# Patient Record
Sex: Female | Born: 1935 | Race: White | Hispanic: No | Marital: Married | Smoking: Never smoker
Health system: Southern US, Community
[De-identification: ages and names within clinical notes are randomized; demographics above are authoritative.]

## PROBLEM LIST (undated history)

## (undated) DIAGNOSIS — I4821 Permanent atrial fibrillation: Secondary | ICD-10-CM

## (undated) DIAGNOSIS — D500 Iron deficiency anemia secondary to blood loss (chronic): Secondary | ICD-10-CM

## (undated) DIAGNOSIS — J449 Chronic obstructive pulmonary disease, unspecified: Secondary | ICD-10-CM

## (undated) DIAGNOSIS — R0902 Hypoxemia: Secondary | ICD-10-CM

## (undated) DIAGNOSIS — I639 Cerebral infarction, unspecified: Secondary | ICD-10-CM

## (undated) DIAGNOSIS — J189 Pneumonia, unspecified organism: Secondary | ICD-10-CM

## (undated) DIAGNOSIS — I509 Heart failure, unspecified: Secondary | ICD-10-CM

## (undated) DIAGNOSIS — K5520 Angiodysplasia of colon without hemorrhage: Secondary | ICD-10-CM

## (undated) DIAGNOSIS — D801 Nonfamilial hypogammaglobulinemia: Secondary | ICD-10-CM

## (undated) DIAGNOSIS — Z9289 Personal history of other medical treatment: Secondary | ICD-10-CM

## (undated) DIAGNOSIS — I4891 Unspecified atrial fibrillation: Secondary | ICD-10-CM

## (undated) DIAGNOSIS — M1990 Unspecified osteoarthritis, unspecified site: Secondary | ICD-10-CM

## (undated) DIAGNOSIS — K219 Gastro-esophageal reflux disease without esophagitis: Secondary | ICD-10-CM

## (undated) DIAGNOSIS — F0390 Unspecified dementia without behavioral disturbance: Secondary | ICD-10-CM

## (undated) DIAGNOSIS — I10 Essential (primary) hypertension: Secondary | ICD-10-CM

## (undated) HISTORY — PX: CHOLECYSTECTOMY: SHX55

## (undated) HISTORY — DX: Angiodysplasia of colon without hemorrhage: K55.20

## (undated) HISTORY — PX: KNEE SURGERY: SHX244

## (undated) HISTORY — DX: Personal history of other medical treatment: Z92.89

## (undated) HISTORY — DX: Iron deficiency anemia secondary to blood loss (chronic): D50.0

## (undated) HISTORY — PX: HIP FRACTURE SURGERY: SHX118

## (undated) HISTORY — PX: CATARACT EXTRACTION: SUR2

---

## 2007-07-05 DIAGNOSIS — I639 Cerebral infarction, unspecified: Secondary | ICD-10-CM

## 2007-07-05 DIAGNOSIS — Z9289 Personal history of other medical treatment: Secondary | ICD-10-CM

## 2007-07-05 HISTORY — DX: Personal history of other medical treatment: Z92.89

## 2007-07-05 HISTORY — DX: Cerebral infarction, unspecified: I63.9

## 2007-12-20 ENCOUNTER — Inpatient Hospital Stay (HOSPITAL_COMMUNITY): Admission: EM | Admit: 2007-12-20 | Discharge: 2007-12-24 | Payer: Self-pay | Admitting: Emergency Medicine

## 2007-12-20 ENCOUNTER — Encounter (INDEPENDENT_AMBULATORY_CARE_PROVIDER_SITE_OTHER): Payer: Self-pay | Admitting: Neurology

## 2007-12-31 ENCOUNTER — Ambulatory Visit: Payer: Self-pay | Admitting: Oncology

## 2008-01-03 LAB — CBC WITH DIFFERENTIAL/PLATELET
Basophils Absolute: 0 10*3/uL (ref 0.0–0.1)
Eosinophils Absolute: 0.3 10*3/uL (ref 0.0–0.5)
HGB: 8.1 g/dL — ABNORMAL LOW (ref 11.6–15.9)
MCV: 75.9 fL — ABNORMAL LOW (ref 81.0–101.0)
NEUT#: 6.3 10*3/uL (ref 1.5–6.5)
RDW: 22.7 % — ABNORMAL HIGH (ref 11.3–14.5)
lymph#: 1.2 10*3/uL (ref 0.9–3.3)

## 2008-01-07 ENCOUNTER — Encounter (HOSPITAL_COMMUNITY): Admission: RE | Admit: 2008-01-07 | Discharge: 2008-02-24 | Payer: Self-pay | Admitting: Oncology

## 2008-01-07 LAB — CBC WITH DIFFERENTIAL/PLATELET
BASO%: 0.5 % (ref 0.0–2.0)
EOS%: 7.9 % — ABNORMAL HIGH (ref 0.0–7.0)
LYMPH%: 14.7 % (ref 14.0–48.0)
MCH: 23.6 pg — ABNORMAL LOW (ref 26.0–34.0)
MCHC: 30.6 g/dL — ABNORMAL LOW (ref 32.0–36.0)
MONO#: 0.6 10*3/uL (ref 0.1–0.9)
RBC: 3.49 10*6/uL — ABNORMAL LOW (ref 3.70–5.32)
WBC: 7 10*3/uL (ref 3.9–10.0)
lymph#: 1 10*3/uL (ref 0.9–3.3)

## 2008-01-07 LAB — FERRITIN: Ferritin: 43 ng/mL (ref 10–291)

## 2008-01-16 LAB — CBC WITH DIFFERENTIAL/PLATELET
BASO%: 1.3 % (ref 0.0–2.0)
Eosinophils Absolute: 0.7 10*3/uL — ABNORMAL HIGH (ref 0.0–0.5)
LYMPH%: 19.6 % (ref 14.0–48.0)
MCHC: 30 g/dL — ABNORMAL LOW (ref 32.0–36.0)
MCV: 83.9 fL (ref 81.0–101.0)
MONO%: 10 % (ref 0.0–13.0)
NEUT#: 3.9 10*3/uL (ref 1.5–6.5)
Platelets: 401 10*3/uL — ABNORMAL HIGH (ref 145–400)
RBC: 3.35 10*6/uL — ABNORMAL LOW (ref 3.70–5.32)
RDW: 21.9 % — ABNORMAL HIGH (ref 11.3–14.5)
WBC: 6.6 10*3/uL (ref 3.9–10.0)

## 2008-01-21 LAB — CBC WITH DIFFERENTIAL/PLATELET
BASO%: 2.3 % — ABNORMAL HIGH (ref 0.0–2.0)
Eosinophils Absolute: 0.3 10*3/uL (ref 0.0–0.5)
LYMPH%: 14 % (ref 14.0–48.0)
MCHC: 31.4 g/dL — ABNORMAL LOW (ref 32.0–36.0)
MONO#: 1 10*3/uL — ABNORMAL HIGH (ref 0.1–0.9)
NEUT#: 4.5 10*3/uL (ref 1.5–6.5)
Platelets: 258 10*3/uL (ref 145–400)
RBC: 3.91 10*6/uL (ref 3.70–5.32)
RDW: 21.7 % — ABNORMAL HIGH (ref 11.3–14.5)
WBC: 7 10*3/uL (ref 3.9–10.0)
lymph#: 1 10*3/uL (ref 0.9–3.3)

## 2008-01-21 LAB — PROTIME-INR
INR: 1 — ABNORMAL LOW (ref 2.00–3.50)
Protime: 12 Seconds (ref 10.6–13.4)

## 2008-01-28 LAB — PROTIME-INR: Protime: 12 Seconds (ref 10.6–13.4)

## 2008-01-28 LAB — CBC WITH DIFFERENTIAL/PLATELET
Basophils Absolute: 0.1 10*3/uL (ref 0.0–0.1)
Eosinophils Absolute: 0.5 10*3/uL (ref 0.0–0.5)
HCT: 35.5 % (ref 34.8–46.6)
HGB: 10.9 g/dL — ABNORMAL LOW (ref 11.6–15.9)
LYMPH%: 19.3 % (ref 14.0–48.0)
MCV: 90.3 fL (ref 81.0–101.0)
MONO#: 0.4 10*3/uL (ref 0.1–0.9)
MONO%: 7.2 % (ref 0.0–13.0)
NEUT#: 3.1 10*3/uL (ref 1.5–6.5)
Platelets: 253 10*3/uL (ref 145–400)
WBC: 5 10*3/uL (ref 3.9–10.0)

## 2008-02-25 ENCOUNTER — Ambulatory Visit: Payer: Self-pay | Admitting: Oncology

## 2008-02-26 ENCOUNTER — Ambulatory Visit: Payer: Self-pay | Admitting: Hematology & Oncology

## 2008-02-27 LAB — CBC WITH DIFFERENTIAL (CANCER CENTER ONLY)
BASO#: 0 10e3/uL (ref 0.0–0.2)
BASO%: 0.4 % (ref 0.0–2.0)
EOS%: 10.1 % — ABNORMAL HIGH (ref 0.0–7.0)
Eosinophils Absolute: 0.5 10e3/uL (ref 0.0–0.5)
HCT: 31.8 % — ABNORMAL LOW (ref 34.8–46.6)
HGB: 10.8 g/dL — ABNORMAL LOW (ref 11.6–15.9)
LYMPH#: 1.1 10e3/uL (ref 0.9–3.3)
LYMPH%: 21.5 % (ref 14.0–48.0)
MCH: 29 pg (ref 26.0–34.0)
MCHC: 33.9 g/dL (ref 32.0–36.0)
MCV: 86 fL (ref 81–101)
MONO#: 0.4 10e3/uL (ref 0.1–0.9)
MONO%: 8.3 % (ref 0.0–13.0)
NEUT#: 3.1 10e3/uL (ref 1.5–6.5)
NEUT%: 59.7 % (ref 39.6–80.0)
Platelets: 311 10e3/uL (ref 145–400)
RBC: 3.72 10e6/uL (ref 3.70–5.32)
RDW: 14.4 % (ref 10.5–14.6)
WBC: 5.2 10e3/uL (ref 3.9–10.0)

## 2008-02-27 LAB — PROTIME-INR (CHCC SATELLITE)
INR: 1.7 — ABNORMAL LOW (ref 2.0–3.5)
Protime: 20.4 Seconds — ABNORMAL HIGH (ref 10.6–13.4)

## 2008-03-11 LAB — CBC WITH DIFFERENTIAL (CANCER CENTER ONLY)
BASO#: 0 10*3/uL (ref 0.0–0.2)
Eosinophils Absolute: 0.5 10*3/uL (ref 0.0–0.5)
HGB: 11 g/dL — ABNORMAL LOW (ref 11.6–15.9)
LYMPH%: 23.7 % (ref 14.0–48.0)
MCH: 28.4 pg (ref 26.0–34.0)
MCV: 87 fL (ref 81–101)
MONO#: 0.5 10*3/uL (ref 0.1–0.9)
MONO%: 9.2 % (ref 0.0–13.0)
NEUT#: 3 10*3/uL (ref 1.5–6.5)
Platelets: 298 10*3/uL (ref 145–400)
RBC: 3.87 10*6/uL (ref 3.70–5.32)
WBC: 5.2 10*3/uL (ref 3.9–10.0)

## 2008-03-11 LAB — PROTIME-INR (CHCC SATELLITE)

## 2008-04-01 ENCOUNTER — Ambulatory Visit (HOSPITAL_COMMUNITY): Admission: RE | Admit: 2008-04-01 | Discharge: 2008-04-01 | Payer: Self-pay | Admitting: Oncology

## 2008-04-01 LAB — CBC WITH DIFFERENTIAL/PLATELET
BASO%: 0.6 % (ref 0.0–2.0)
Eosinophils Absolute: 0.6 10*3/uL — ABNORMAL HIGH (ref 0.0–0.5)
HCT: 34.6 % — ABNORMAL LOW (ref 34.8–46.6)
HGB: 11.6 g/dL (ref 11.6–15.9)
LYMPH%: 16.8 % (ref 14.0–48.0)
MCHC: 33.4 g/dL (ref 32.0–36.0)
MONO#: 0.7 10*3/uL (ref 0.1–0.9)
NEUT#: 3.9 10*3/uL (ref 1.5–6.5)
NEUT%: 61.9 % (ref 39.6–76.8)
Platelets: 256 10*3/uL (ref 145–400)
WBC: 6.2 10*3/uL (ref 3.9–10.0)
lymph#: 1 10*3/uL (ref 0.9–3.3)

## 2008-04-01 LAB — PROTIME-INR: INR: 2.2 (ref 2.00–3.50)

## 2008-04-01 LAB — IRON AND TIBC
%SAT: 11 % — ABNORMAL LOW (ref 20–55)
TIBC: 329 ug/dL (ref 250–470)

## 2008-04-01 LAB — FERRITIN: Ferritin: 47 ng/mL (ref 10–291)

## 2008-04-01 LAB — VITAMIN B12: Vitamin B-12: 508 pg/mL (ref 211–911)

## 2008-04-25 ENCOUNTER — Ambulatory Visit: Payer: Self-pay | Admitting: Oncology

## 2008-05-23 LAB — PROTIME-INR

## 2008-05-23 LAB — CBC WITH DIFFERENTIAL/PLATELET
BASO%: 1.9 % (ref 0.0–2.0)
Basophils Absolute: 0.1 10*3/uL (ref 0.0–0.1)
EOS%: 11.2 % — ABNORMAL HIGH (ref 0.0–7.0)
HCT: 33.5 % — ABNORMAL LOW (ref 34.8–46.6)
HGB: 10.5 g/dL — ABNORMAL LOW (ref 11.6–15.9)
LYMPH%: 16.6 % (ref 14.0–48.0)
MCH: 28.7 pg (ref 26.0–34.0)
MCHC: 31.5 g/dL — ABNORMAL LOW (ref 32.0–36.0)
MCV: 91.2 fL (ref 81.0–101.0)
MONO%: 8.8 % (ref 0.0–13.0)
NEUT%: 61.5 % (ref 39.6–76.8)

## 2008-06-06 LAB — PROTHROMBIN TIME: Prothrombin Time: 20.1 seconds — ABNORMAL HIGH (ref 11.6–15.2)

## 2008-06-20 ENCOUNTER — Ambulatory Visit: Payer: Self-pay | Admitting: Hematology & Oncology

## 2008-07-01 ENCOUNTER — Ambulatory Visit: Payer: Self-pay | Admitting: Oncology

## 2008-07-01 LAB — CBC & DIFF AND RETIC
BASO%: 0.7 % (ref 0.0–2.0)
Basophils Absolute: 0 10*3/uL (ref 0.0–0.1)
EOS%: 10.3 % — ABNORMAL HIGH (ref 0.0–7.0)
HCT: 31.5 % — ABNORMAL LOW (ref 34.8–46.6)
HGB: 10.4 g/dL — ABNORMAL LOW (ref 11.6–15.9)
IRF: 0.46 — ABNORMAL HIGH (ref 0.130–0.330)
LYMPH%: 15.1 % (ref 14.0–48.0)
MCH: 29.8 pg (ref 26.0–34.0)
MCHC: 32.8 g/dL (ref 32.0–36.0)
MCV: 90.9 fL (ref 81.0–101.0)
NEUT%: 65.2 % (ref 39.6–76.8)
Platelets: 305 10*3/uL (ref 145–400)

## 2008-07-01 LAB — FERRITIN: Ferritin: 48 ng/mL (ref 10–291)

## 2008-07-01 LAB — IRON AND TIBC
%SAT: 12 % — ABNORMAL LOW (ref 20–55)
Iron: 43 ug/dL (ref 42–145)

## 2008-07-01 LAB — PROTIME-INR: Protime: 28.8 Seconds — ABNORMAL HIGH (ref 10.6–13.4)

## 2008-09-26 ENCOUNTER — Ambulatory Visit: Payer: Self-pay | Admitting: Oncology

## 2009-01-28 ENCOUNTER — Inpatient Hospital Stay (HOSPITAL_COMMUNITY): Admission: EM | Admit: 2009-01-28 | Discharge: 2009-02-05 | Payer: Self-pay | Admitting: Emergency Medicine

## 2009-02-04 ENCOUNTER — Ambulatory Visit: Payer: Self-pay | Admitting: Physical Medicine & Rehabilitation

## 2009-04-24 ENCOUNTER — Ambulatory Visit: Payer: Self-pay | Admitting: Oncology

## 2009-04-28 LAB — CBC WITH DIFFERENTIAL/PLATELET
Basophils Absolute: 0 10*3/uL (ref 0.0–0.1)
Eosinophils Absolute: 0.8 10*3/uL — ABNORMAL HIGH (ref 0.0–0.5)
HGB: 10.8 g/dL — ABNORMAL LOW (ref 11.6–15.9)
MCV: 90.4 fL (ref 79.5–101.0)
MONO#: 0.4 10*3/uL (ref 0.1–0.9)
MONO%: 7.9 % (ref 0.0–14.0)
NEUT#: 3.4 10*3/uL (ref 1.5–6.5)
RBC: 3.55 10*6/uL — ABNORMAL LOW (ref 3.70–5.45)
RDW: 16.5 % — ABNORMAL HIGH (ref 11.2–14.5)
WBC: 5.7 10*3/uL (ref 3.9–10.3)
lymph#: 1 10*3/uL (ref 0.9–3.3)

## 2009-04-28 LAB — PROTHROMBIN TIME
INR: 1.62 — ABNORMAL HIGH (ref <1.50)
Prothrombin Time: 19.1 seconds — ABNORMAL HIGH (ref 11.6–15.2)

## 2009-05-04 ENCOUNTER — Ambulatory Visit: Payer: Self-pay | Admitting: Hematology & Oncology

## 2009-05-18 LAB — PROTIME-INR (CHCC SATELLITE)
INR: 1.9 — ABNORMAL LOW (ref 2.0–3.5)
Protime: 22.8 Seconds — ABNORMAL HIGH (ref 10.6–13.4)

## 2009-06-19 ENCOUNTER — Ambulatory Visit: Payer: Self-pay | Admitting: Oncology

## 2009-06-23 LAB — CBC WITH DIFFERENTIAL/PLATELET
BASO%: 1.6 % (ref 0.0–2.0)
EOS%: 14.1 % — ABNORMAL HIGH (ref 0.0–7.0)
HCT: 34.2 % — ABNORMAL LOW (ref 34.8–46.6)
LYMPH%: 21.5 % (ref 14.0–49.7)
MCH: 29 pg (ref 25.1–34.0)
MCHC: 31.6 g/dL (ref 31.5–36.0)
MONO#: 0.6 10*3/uL (ref 0.1–0.9)
MONO%: 11.7 % (ref 0.0–14.0)
NEUT%: 51.1 % (ref 38.4–76.8)
Platelets: 287 10*3/uL (ref 145–400)
RBC: 3.73 10*6/uL (ref 3.70–5.45)
WBC: 5.1 10*3/uL (ref 3.9–10.3)

## 2009-06-23 LAB — PROTIME-INR: Protime: 16.8 Seconds — ABNORMAL HIGH (ref 10.6–13.4)

## 2009-07-08 ENCOUNTER — Ambulatory Visit: Payer: Self-pay | Admitting: Hematology & Oncology

## 2009-07-09 LAB — PROTIME-INR (CHCC SATELLITE): Protime: 21.6 Seconds — ABNORMAL HIGH (ref 10.6–13.4)

## 2009-07-09 LAB — IRON AND TIBC: UIBC: 28 ug/dL

## 2009-07-09 LAB — FERRITIN: Ferritin: 67 ng/mL (ref 10–291)

## 2009-08-07 ENCOUNTER — Ambulatory Visit: Payer: Self-pay | Admitting: Hematology & Oncology

## 2009-08-10 LAB — PROTIME-INR (CHCC SATELLITE): INR: 1.9 — ABNORMAL LOW (ref 2.0–3.5)

## 2009-08-31 ENCOUNTER — Inpatient Hospital Stay (HOSPITAL_COMMUNITY): Admission: EM | Admit: 2009-08-31 | Discharge: 2009-09-04 | Payer: Self-pay | Admitting: Emergency Medicine

## 2009-09-11 ENCOUNTER — Ambulatory Visit: Payer: Self-pay | Admitting: Hematology & Oncology

## 2009-09-14 LAB — PROTIME-INR (CHCC SATELLITE): INR: 2.5 (ref 2.0–3.5)

## 2009-09-14 LAB — RETICULOCYTES (CHCC)
RBC.: 3.83 MIL/uL — ABNORMAL LOW (ref 3.87–5.11)
Retic Ct Pct: 3.3 % — ABNORMAL HIGH (ref 0.4–3.1)

## 2009-09-14 LAB — CBC WITH DIFFERENTIAL (CANCER CENTER ONLY)
Eosinophils Absolute: 0.8 10*3/uL — ABNORMAL HIGH (ref 0.0–0.5)
MONO#: 0.5 10*3/uL (ref 0.1–0.9)
NEUT#: 3.6 10*3/uL (ref 1.5–6.5)
Platelets: 513 10*3/uL — ABNORMAL HIGH (ref 145–400)
RBC: 3.83 10*6/uL (ref 3.70–5.32)
WBC: 6.3 10*3/uL (ref 3.9–10.0)

## 2009-09-14 LAB — IRON AND TIBC: TIBC: 366 ug/dL (ref 250–470)

## 2009-09-14 LAB — BASIC METABOLIC PANEL
BUN: 14 mg/dL (ref 6–23)
Calcium: 10.1 mg/dL (ref 8.4–10.5)
Creatinine, Ser: 0.92 mg/dL (ref 0.40–1.20)

## 2009-09-21 LAB — PROTIME-INR (CHCC SATELLITE)
INR: 1.8 — ABNORMAL LOW (ref 2.0–3.5)
Protime: 21.6 Seconds — ABNORMAL HIGH (ref 10.6–13.4)

## 2009-09-24 LAB — URINALYSIS, MICROSCOPIC (CHCC SATELLITE)
Ketones: NEGATIVE mg/dL
Leukocyte Esterase: NEGATIVE
Protein: NEGATIVE mg/dL
pH: 6 (ref 4.60–8.00)

## 2009-09-25 LAB — URINE CULTURE

## 2009-10-12 ENCOUNTER — Ambulatory Visit: Payer: Self-pay | Admitting: Hematology & Oncology

## 2009-10-13 LAB — CBC WITH DIFFERENTIAL (CANCER CENTER ONLY)
BASO#: 0.1 10*3/uL (ref 0.0–0.2)
BASO%: 0.6 % (ref 0.0–2.0)
EOS%: 3 % (ref 0.0–7.0)
HGB: 11.3 g/dL — ABNORMAL LOW (ref 11.6–15.9)
LYMPH#: 1.6 10*3/uL (ref 0.9–3.3)
MCHC: 31.7 g/dL — ABNORMAL LOW (ref 32.0–36.0)
MONO#: 0.9 10*3/uL (ref 0.1–0.9)
NEUT#: 8 10*3/uL — ABNORMAL HIGH (ref 1.5–6.5)
WBC: 10.9 10*3/uL — ABNORMAL HIGH (ref 3.9–10.0)

## 2009-10-13 LAB — PROTIME-INR (CHCC SATELLITE): INR: 1.9 — ABNORMAL LOW (ref 2.0–3.5)

## 2009-11-10 LAB — PROTIME-INR (CHCC SATELLITE): INR: 1.9 — ABNORMAL LOW (ref 2.0–3.5)

## 2009-12-18 ENCOUNTER — Ambulatory Visit: Payer: Self-pay | Admitting: Oncology

## 2010-01-01 ENCOUNTER — Ambulatory Visit: Payer: Self-pay | Admitting: Hematology & Oncology

## 2010-01-05 LAB — PROTIME-INR (CHCC SATELLITE)
INR: 2.4 (ref 2.0–3.5)
Protime: 28.8 Seconds — ABNORMAL HIGH (ref 10.6–13.4)

## 2010-01-20 LAB — PROTIME-INR (CHCC SATELLITE): Protime: 22.8 Seconds — ABNORMAL HIGH (ref 10.6–13.4)

## 2010-02-15 ENCOUNTER — Ambulatory Visit: Payer: Self-pay | Admitting: Hematology & Oncology

## 2010-02-17 LAB — PROTIME-INR (CHCC SATELLITE)

## 2010-03-11 LAB — CBC WITH DIFFERENTIAL (CANCER CENTER ONLY)
BASO#: 0.1 10*3/uL (ref 0.0–0.2)
LYMPH#: 1.1 10*3/uL (ref 0.9–3.3)
MCH: 29.3 pg (ref 26.0–34.0)
MCHC: 33.2 g/dL (ref 32.0–36.0)
MCV: 88 fL (ref 81–101)
MONO#: 1.2 10*3/uL — ABNORMAL HIGH (ref 0.1–0.9)
MONO%: 9.9 % (ref 0.0–13.0)
NEUT#: 9.7 10*3/uL — ABNORMAL HIGH (ref 1.5–6.5)

## 2010-03-11 LAB — PROTIME-INR (CHCC SATELLITE)

## 2010-03-17 ENCOUNTER — Ambulatory Visit: Payer: Self-pay | Admitting: Hematology & Oncology

## 2010-03-18 LAB — PROTIME-INR (CHCC SATELLITE)
INR: 2.2 (ref 2.0–3.5)
Protime: 26.4 Seconds — ABNORMAL HIGH (ref 10.6–13.4)

## 2010-03-31 LAB — PROTIME-INR (CHCC SATELLITE)

## 2010-05-05 ENCOUNTER — Ambulatory Visit: Payer: Self-pay | Admitting: Hematology & Oncology

## 2010-05-12 LAB — CBC WITH DIFFERENTIAL (CANCER CENTER ONLY)
Eosinophils Absolute: 0.7 10*3/uL — ABNORMAL HIGH (ref 0.0–0.5)
HCT: 36.9 % (ref 34.8–46.6)
HGB: 12 g/dL (ref 11.6–15.9)
LYMPH%: 11.1 % — ABNORMAL LOW (ref 14.0–48.0)
MCV: 90 fL (ref 81–101)
MONO#: 0.8 10*3/uL (ref 0.1–0.9)
NEUT%: 69.7 % (ref 39.6–80.0)
RBC: 4.08 10*6/uL (ref 3.70–5.32)
WBC: 8.1 10*3/uL (ref 3.9–10.0)

## 2010-06-02 LAB — PROTIME-INR (CHCC SATELLITE)

## 2010-06-02 LAB — PROTHROMBIN TIME
INR: 4.08 — ABNORMAL HIGH (ref <1.50)
Prothrombin Time: 39.5 seconds — ABNORMAL HIGH (ref 11.6–15.2)

## 2010-06-08 ENCOUNTER — Ambulatory Visit: Payer: Self-pay | Admitting: Hematology & Oncology

## 2010-06-10 LAB — PROTIME-INR (CHCC SATELLITE): Protime: 31.2 Seconds — ABNORMAL HIGH (ref 10.6–13.4)

## 2010-06-21 ENCOUNTER — Ambulatory Visit: Payer: Self-pay | Admitting: Oncology

## 2010-06-22 LAB — COMPREHENSIVE METABOLIC PANEL
Alkaline Phosphatase: 66 U/L (ref 39–117)
BUN: 16 mg/dL (ref 6–23)
CO2: 32 mEq/L (ref 19–32)
Creatinine, Ser: 0.81 mg/dL (ref 0.40–1.20)
Glucose, Bld: 133 mg/dL — ABNORMAL HIGH (ref 70–99)
Total Bilirubin: 0.2 mg/dL — ABNORMAL LOW (ref 0.3–1.2)

## 2010-06-22 LAB — CBC & DIFF AND RETIC
BASO%: 0.5 % (ref 0.0–2.0)
EOS%: 9 % — ABNORMAL HIGH (ref 0.0–7.0)
LYMPH%: 21.7 % (ref 14.0–49.7)
MCH: 28 pg (ref 25.1–34.0)
MCHC: 30.8 g/dL — ABNORMAL LOW (ref 31.5–36.0)
MCV: 90.7 fL (ref 79.5–101.0)
MONO#: 0.6 10*3/uL (ref 0.1–0.9)
MONO%: 11 % (ref 0.0–14.0)
NEUT%: 57.8 % (ref 38.4–76.8)
Platelets: 360 10*3/uL (ref 145–400)
RBC: 3.97 10*6/uL (ref 3.70–5.45)
Retic %: 3.13 % — ABNORMAL HIGH (ref 0.50–1.50)
WBC: 5.6 10*3/uL (ref 3.9–10.3)

## 2010-06-22 LAB — IRON AND TIBC
%SAT: 36 % (ref 20–55)
Iron: 121 ug/dL (ref 42–145)
TIBC: 332 ug/dL (ref 250–470)

## 2010-06-22 LAB — LACTATE DEHYDROGENASE: LDH: 154 U/L (ref 94–250)

## 2010-07-06 ENCOUNTER — Ambulatory Visit: Payer: Self-pay | Admitting: Hematology & Oncology

## 2010-07-06 LAB — PROTIME-INR (CHCC SATELLITE): Protime: 56.4 Seconds — ABNORMAL HIGH (ref 10.6–13.4)

## 2010-07-06 LAB — CBC WITH DIFFERENTIAL (CANCER CENTER ONLY)
BASO#: 0 10*3/uL (ref 0.0–0.2)
EOS%: 10.2 % — ABNORMAL HIGH (ref 0.0–7.0)
HCT: 35 % (ref 34.8–46.6)
MCV: 87 fL (ref 81–101)
MONO%: 9.6 % (ref 0.0–13.0)
NEUT%: 59.9 % (ref 39.6–80.0)
RBC: 4 10*6/uL (ref 3.70–5.32)

## 2010-08-03 LAB — PROTIME-INR (CHCC SATELLITE): Protime: 19.2 Seconds — ABNORMAL HIGH (ref 10.6–13.4)

## 2010-09-01 ENCOUNTER — Other Ambulatory Visit: Payer: Self-pay | Admitting: Oncology

## 2010-09-01 ENCOUNTER — Encounter (HOSPITAL_BASED_OUTPATIENT_CLINIC_OR_DEPARTMENT_OTHER): Payer: Medicare Other | Admitting: Hematology & Oncology

## 2010-09-01 DIAGNOSIS — K5521 Angiodysplasia of colon with hemorrhage: Secondary | ICD-10-CM

## 2010-09-01 DIAGNOSIS — D500 Iron deficiency anemia secondary to blood loss (chronic): Secondary | ICD-10-CM

## 2010-09-01 LAB — CBC WITH DIFFERENTIAL (CANCER CENTER ONLY)
BASO%: 0.4 % (ref 0.0–2.0)
EOS%: 9.5 % — ABNORMAL HIGH (ref 0.0–7.0)
HCT: 33.9 % — ABNORMAL LOW (ref 34.8–46.6)
LYMPH#: 0.9 10*3/uL (ref 0.9–3.3)
LYMPH%: 11.2 % — ABNORMAL LOW (ref 14.0–48.0)
MCHC: 32.8 g/dL (ref 32.0–36.0)
MONO%: 8.6 % (ref 0.0–13.0)
NEUT%: 70.3 % (ref 39.6–80.0)
RDW: 13.4 % (ref 10.5–14.6)

## 2010-09-01 LAB — PROTIME-INR (CHCC SATELLITE)

## 2010-09-14 ENCOUNTER — Other Ambulatory Visit: Payer: Self-pay | Admitting: Hematology & Oncology

## 2010-09-14 ENCOUNTER — Encounter (HOSPITAL_BASED_OUTPATIENT_CLINIC_OR_DEPARTMENT_OTHER): Payer: Medicare Other | Admitting: Hematology & Oncology

## 2010-09-14 DIAGNOSIS — D500 Iron deficiency anemia secondary to blood loss (chronic): Secondary | ICD-10-CM

## 2010-09-14 DIAGNOSIS — K5521 Angiodysplasia of colon with hemorrhage: Secondary | ICD-10-CM

## 2010-09-14 LAB — CBC WITH DIFFERENTIAL (CANCER CENTER ONLY)
BASO%: 0.9 % (ref 0.0–2.0)
EOS%: 11.4 % — ABNORMAL HIGH (ref 0.0–7.0)
LYMPH#: 0.7 10*3/uL — ABNORMAL LOW (ref 0.9–3.3)
MCH: 28.1 pg (ref 26.0–34.0)
MCHC: 31 g/dL — ABNORMAL LOW (ref 32.0–36.0)
MONO%: 10.7 % (ref 0.0–13.0)
NEUT#: 3.5 10*3/uL (ref 1.5–6.5)
Platelets: 326 10*3/uL (ref 145–400)
RDW: 15.6 % (ref 11.1–15.7)

## 2010-09-14 LAB — PROTIME-INR (CHCC SATELLITE)

## 2010-09-22 LAB — DIFFERENTIAL
Lymphocytes Relative: 7 % — ABNORMAL LOW (ref 12–46)
Lymphs Abs: 0.9 10*3/uL (ref 0.7–4.0)
Monocytes Absolute: 1.1 10*3/uL — ABNORMAL HIGH (ref 0.1–1.0)
Monocytes Relative: 8 % (ref 3–12)
Neutro Abs: 11.3 10*3/uL — ABNORMAL HIGH (ref 1.7–7.7)

## 2010-09-22 LAB — CBC
HCT: 30 % — ABNORMAL LOW (ref 36.0–46.0)
MCV: 93.2 fL (ref 78.0–100.0)
Platelets: 220 10*3/uL (ref 150–400)
RDW: 16.4 % — ABNORMAL HIGH (ref 11.5–15.5)

## 2010-09-22 LAB — COMPREHENSIVE METABOLIC PANEL
AST: 103 U/L — ABNORMAL HIGH (ref 0–37)
Albumin: 2.9 g/dL — ABNORMAL LOW (ref 3.5–5.2)
BUN: 15 mg/dL (ref 6–23)
Creatinine, Ser: 0.72 mg/dL (ref 0.4–1.2)
GFR calc Af Amer: >60 mL/min (ref >60)
Total Protein: 5.2 g/dL — ABNORMAL LOW (ref 6.0–8.3)

## 2010-09-22 LAB — URINALYSIS, ROUTINE W REFLEX MICROSCOPIC
Nitrite: NEGATIVE
Specific Gravity, Urine: 1.025 (ref 1.005–1.030)
Urobilinogen, UA: 0.2 mg/dL (ref 0.0–1.0)

## 2010-09-22 LAB — URINE CULTURE

## 2010-09-22 LAB — CULTURE, BLOOD (ROUTINE X 2): Culture: NO GROWTH

## 2010-09-22 LAB — POCT CARDIAC MARKERS
CKMB, poc: <1 ng/mL — ABNORMAL LOW (ref 1.0–8.0)
Myoglobin, poc: 50.5 ng/mL (ref 12–200)
Troponin i, poc: <0.05 ng/mL (ref 0.00–0.09)

## 2010-09-22 LAB — LACTIC ACID, PLASMA: Lactic Acid, Venous: 2.3 mmol/L — ABNORMAL HIGH (ref 0.5–2.2)

## 2010-09-26 LAB — CBC
HCT: 26.4 % — ABNORMAL LOW (ref 36.0–46.0)
HCT: 26.8 % — ABNORMAL LOW (ref 36.0–46.0)
Hemoglobin: 8.9 g/dL — ABNORMAL LOW (ref 12.0–15.0)
Hemoglobin: 9 g/dL — ABNORMAL LOW (ref 12.0–15.0)
MCHC: 33.2 g/dL (ref 30.0–36.0)
MCHC: 33.4 g/dL (ref 30.0–36.0)
MCHC: 33.4 g/dL (ref 30.0–36.0)
MCHC: 33.8 g/dL (ref 30.0–36.0)
MCV: 92.9 fL (ref 78.0–100.0)
Platelets: 234 10*3/uL (ref 150–400)
Platelets: 246 10*3/uL (ref 150–400)
RBC: 2.93 MIL/uL — ABNORMAL LOW (ref 3.87–5.11)
RBC: 3.06 MIL/uL — ABNORMAL LOW (ref 3.87–5.11)
RDW: 15.7 % — ABNORMAL HIGH (ref 11.5–15.5)
RDW: 15.7 % — ABNORMAL HIGH (ref 11.5–15.5)
RDW: 15.8 % — ABNORMAL HIGH (ref 11.5–15.5)

## 2010-09-26 LAB — BASIC METABOLIC PANEL
BUN: 5 mg/dL — ABNORMAL LOW (ref 6–23)
BUN: 6 mg/dL (ref 6–23)
CO2: 25 mEq/L (ref 19–32)
CO2: 28 mEq/L (ref 19–32)
CO2: 28 mEq/L (ref 19–32)
Calcium: 7.8 mg/dL — ABNORMAL LOW (ref 8.4–10.5)
Calcium: 8.4 mg/dL (ref 8.4–10.5)
Chloride: 109 mEq/L (ref 96–112)
Creatinine, Ser: 0.57 mg/dL (ref 0.4–1.2)
Creatinine, Ser: 0.63 mg/dL (ref 0.4–1.2)
Creatinine, Ser: 0.66 mg/dL (ref 0.4–1.2)
GFR calc Af Amer: >60 mL/min (ref >60)
GFR calc Af Amer: >60 mL/min (ref >60)
GFR calc non Af Amer: >60 mL/min (ref >60)
Glucose, Bld: 108 mg/dL — ABNORMAL HIGH (ref 70–99)
Glucose, Bld: 109 mg/dL — ABNORMAL HIGH (ref 70–99)
Glucose, Bld: 114 mg/dL — ABNORMAL HIGH (ref 70–99)
Potassium: 3.6 mEq/L (ref 3.5–5.1)
Sodium: 141 mEq/L (ref 135–145)

## 2010-09-26 LAB — PROTIME-INR
INR: 2.45 — ABNORMAL HIGH (ref 0.00–1.49)
Prothrombin Time: 26.4 seconds — ABNORMAL HIGH (ref 11.6–15.2)

## 2010-09-26 LAB — DIFFERENTIAL
Basophils Absolute: 0 10*3/uL (ref 0.0–0.1)
Basophils Relative: 0 % (ref 0–1)
Eosinophils Relative: 3 % (ref 0–5)
Monocytes Absolute: 0.9 10*3/uL (ref 0.1–1.0)

## 2010-10-07 ENCOUNTER — Other Ambulatory Visit: Payer: Self-pay | Admitting: Oncology

## 2010-10-07 ENCOUNTER — Encounter (HOSPITAL_BASED_OUTPATIENT_CLINIC_OR_DEPARTMENT_OTHER): Payer: Medicare Other | Admitting: Hematology & Oncology

## 2010-10-07 DIAGNOSIS — K5521 Angiodysplasia of colon with hemorrhage: Secondary | ICD-10-CM

## 2010-10-07 DIAGNOSIS — D500 Iron deficiency anemia secondary to blood loss (chronic): Secondary | ICD-10-CM

## 2010-10-07 LAB — CBC WITH DIFFERENTIAL (CANCER CENTER ONLY)
Eosinophils Absolute: 0.5 10*3/uL (ref 0.0–0.5)
LYMPH%: 14.8 % (ref 14.0–48.0)
MCH: 27.8 pg (ref 26.0–34.0)
MCHC: 31 g/dL — ABNORMAL LOW (ref 32.0–36.0)
MONO#: 1 10*3/uL — ABNORMAL HIGH (ref 0.1–0.9)
NEUT#: 4.9 10*3/uL (ref 1.5–6.5)
RDW: 14.5 % (ref 11.1–15.7)

## 2010-10-07 LAB — PROTIME-INR (CHCC SATELLITE): INR: 2.1 (ref 2.0–3.5)

## 2010-10-09 LAB — BASIC METABOLIC PANEL
BUN: 9 mg/dL (ref 6–23)
CO2: 32 mEq/L (ref 19–32)
Calcium: 8.9 mg/dL (ref 8.4–10.5)
Creatinine, Ser: 0.57 mg/dL (ref 0.4–1.2)
Glucose, Bld: 118 mg/dL — ABNORMAL HIGH (ref 70–99)
Sodium: 138 mEq/L (ref 135–145)

## 2010-10-09 LAB — CBC
HCT: 26.5 % — ABNORMAL LOW (ref 36.0–46.0)
HCT: 30 % — ABNORMAL LOW (ref 36.0–46.0)
HCT: 30.5 % — ABNORMAL LOW (ref 36.0–46.0)
Hemoglobin: 10.2 g/dL — ABNORMAL LOW (ref 12.0–15.0)
Hemoglobin: 9.1 g/dL — ABNORMAL LOW (ref 12.0–15.0)
Hemoglobin: 9.7 g/dL — ABNORMAL LOW (ref 12.0–15.0)
MCHC: 32.3 g/dL (ref 30.0–36.0)
MCHC: 33.2 g/dL (ref 30.0–36.0)
MCHC: 33.9 g/dL (ref 30.0–36.0)
MCV: 90.2 fL (ref 78.0–100.0)
Platelets: 206 10*3/uL (ref 150–400)
Platelets: 207 10*3/uL (ref 150–400)
Platelets: 288 10*3/uL (ref 150–400)
RBC: 3.38 MIL/uL — ABNORMAL LOW (ref 3.87–5.11)
RDW: 15.5 % (ref 11.5–15.5)
RDW: 15.6 % — ABNORMAL HIGH (ref 11.5–15.5)
RDW: 15.7 % — ABNORMAL HIGH (ref 11.5–15.5)
RDW: 15.8 % — ABNORMAL HIGH (ref 11.5–15.5)
WBC: 8.2 10*3/uL (ref 4.0–10.5)

## 2010-10-09 LAB — COMPREHENSIVE METABOLIC PANEL
ALT: 28 U/L (ref 0–35)
AST: 28 U/L (ref 0–37)
Albumin: 2 g/dL — ABNORMAL LOW (ref 3.5–5.2)
Alkaline Phosphatase: 69 U/L (ref 39–117)
CO2: 30 mEq/L (ref 19–32)
Chloride: 103 mEq/L (ref 96–112)
GFR calc Af Amer: >60 mL/min (ref >60)
Potassium: 3.3 mEq/L — ABNORMAL LOW (ref 3.5–5.1)
Total Bilirubin: 0.4 mg/dL (ref 0.3–1.2)

## 2010-10-09 LAB — PROTIME-INR
INR: 2.4 — ABNORMAL HIGH (ref 0.00–1.49)
INR: 2.6 — ABNORMAL HIGH (ref 0.00–1.49)
Prothrombin Time: 29.5 seconds — ABNORMAL HIGH (ref 11.6–15.2)

## 2010-10-09 LAB — CROSSMATCH

## 2010-10-09 LAB — VITAMIN B12: Vitamin B-12: 920 pg/mL — ABNORMAL HIGH (ref 211–911)

## 2010-10-09 LAB — AMMONIA: Ammonia: 15 umol/L (ref 11–35)

## 2010-10-09 LAB — APTT: aPTT: 38 seconds — ABNORMAL HIGH (ref 24–37)

## 2010-10-10 LAB — PREPARE FRESH FROZEN PLASMA

## 2010-10-10 LAB — URINALYSIS, MICROSCOPIC ONLY
Specific Gravity, Urine: 1.022 (ref 1.005–1.030)
Urobilinogen, UA: 1 mg/dL (ref 0.0–1.0)
pH: 5.5 (ref 5.0–8.0)

## 2010-10-10 LAB — PREPARE RBC (CROSSMATCH)

## 2010-10-10 LAB — BASIC METABOLIC PANEL
BUN: 9 mg/dL (ref 6–23)
CO2: 31 mEq/L (ref 19–32)
Calcium: 8.7 mg/dL (ref 8.4–10.5)
Calcium: 9.3 mg/dL (ref 8.4–10.5)
Chloride: 104 mEq/L (ref 96–112)
Creatinine, Ser: 0.71 mg/dL (ref 0.4–1.2)
GFR calc Af Amer: >60 mL/min (ref >60)
GFR calc non Af Amer: >60 mL/min (ref >60)
GFR calc non Af Amer: >60 mL/min (ref >60)
Glucose, Bld: 168 mg/dL — ABNORMAL HIGH (ref 70–99)
Potassium: 3.1 mEq/L — ABNORMAL LOW (ref 3.5–5.1)
Potassium: 3.4 mEq/L — ABNORMAL LOW (ref 3.5–5.1)
Sodium: 138 mEq/L (ref 135–145)
Sodium: 141 mEq/L (ref 135–145)
Sodium: 141 mEq/L (ref 135–145)

## 2010-10-10 LAB — LIPID PANEL
Cholesterol: 115 mg/dL (ref 0–200)
LDL Cholesterol: 40 mg/dL (ref 0–99)
Total CHOL/HDL Ratio: 2 RATIO
VLDL: 17 mg/dL (ref 0–40)

## 2010-10-10 LAB — HEPATIC FUNCTION PANEL
AST: 40 U/L — ABNORMAL HIGH (ref 0–37)
Albumin: 3 g/dL — ABNORMAL LOW (ref 3.5–5.2)
Total Protein: 5 g/dL — ABNORMAL LOW (ref 6.0–8.3)

## 2010-10-10 LAB — MAGNESIUM: Magnesium: 1.7 mg/dL (ref 1.5–2.5)

## 2010-10-10 LAB — RETICULOCYTES
RBC.: 3.48 MIL/uL — ABNORMAL LOW (ref 3.87–5.11)
Retic Count, Absolute: 114.8 10*3/uL (ref 19.0–186.0)

## 2010-10-10 LAB — CBC
HCT: 31.6 % — ABNORMAL LOW (ref 36.0–46.0)
HCT: 32.2 % — ABNORMAL LOW (ref 36.0–46.0)
Hemoglobin: 10.3 g/dL — ABNORMAL LOW (ref 12.0–15.0)
Hemoglobin: 10.4 g/dL — ABNORMAL LOW (ref 12.0–15.0)
Hemoglobin: 10.8 g/dL — ABNORMAL LOW (ref 12.0–15.0)
MCHC: 32 g/dL (ref 30.0–36.0)
MCV: 91 fL (ref 78.0–100.0)
Platelets: 192 10*3/uL (ref 150–400)
RBC: 3.59 MIL/uL — ABNORMAL LOW (ref 3.87–5.11)
RDW: 16.2 % — ABNORMAL HIGH (ref 11.5–15.5)
RDW: 17.7 % — ABNORMAL HIGH (ref 11.5–15.5)
WBC: 6.3 10*3/uL (ref 4.0–10.5)
WBC: 7.2 10*3/uL (ref 4.0–10.5)

## 2010-10-10 LAB — URINALYSIS, ROUTINE W REFLEX MICROSCOPIC
Nitrite: NEGATIVE
Specific Gravity, Urine: 1.017 (ref 1.005–1.030)
Urobilinogen, UA: 0.2 mg/dL (ref 0.0–1.0)
pH: 6.5 (ref 5.0–8.0)

## 2010-10-10 LAB — FERRITIN: Ferritin: 158 ng/mL (ref 10–291)

## 2010-10-10 LAB — HEPARIN LEVEL (UNFRACTIONATED): Heparin Unfractionated: 0.21 IU/mL — ABNORMAL LOW (ref 0.30–0.70)

## 2010-10-10 LAB — IRON AND TIBC
Iron: 13 ug/dL — ABNORMAL LOW (ref 42–135)
TIBC: 284 ug/dL (ref 250–470)

## 2010-10-10 LAB — DIFFERENTIAL
Basophils Absolute: 0.1 10*3/uL (ref 0.0–0.1)
Basophils Relative: 1 % (ref 0–1)
Eosinophils Relative: 4 % (ref 0–5)
Lymphocytes Relative: 7 % — ABNORMAL LOW (ref 12–46)
Monocytes Absolute: 0.8 10*3/uL (ref 0.1–1.0)
Monocytes Relative: 7 % (ref 3–12)

## 2010-10-10 LAB — PROTIME-INR
INR: 1.6 — ABNORMAL HIGH (ref 0.00–1.49)
INR: 1.7 — ABNORMAL HIGH (ref 0.00–1.49)
INR: 1.9 — ABNORMAL HIGH (ref 0.00–1.49)
Prothrombin Time: 20.4 seconds — ABNORMAL HIGH (ref 11.6–15.2)
Prothrombin Time: 22.6 seconds — ABNORMAL HIGH (ref 11.6–15.2)

## 2010-10-10 LAB — TYPE AND SCREEN: Antibody Screen: NEGATIVE

## 2010-10-10 LAB — TSH: TSH: 1.812 u[IU]/mL (ref 0.350–4.500)

## 2010-10-10 LAB — FOLATE: Folate: >20 ng/mL

## 2010-10-10 LAB — HEMOGLOBIN AND HEMATOCRIT, BLOOD: Hemoglobin: 11.6 g/dL — ABNORMAL LOW (ref 12.0–15.0)

## 2010-11-13 ENCOUNTER — Emergency Department (HOSPITAL_COMMUNITY): Payer: Medicare Other

## 2010-11-13 ENCOUNTER — Emergency Department (HOSPITAL_COMMUNITY)
Admission: EM | Admit: 2010-11-13 | Discharge: 2010-11-13 | Disposition: A | Discharge status: Home / Self Care | Payer: Medicare Other | Attending: Emergency Medicine | Admitting: Emergency Medicine

## 2010-11-13 DIAGNOSIS — I517 Cardiomegaly: Secondary | ICD-10-CM | POA: Insufficient documentation

## 2010-11-13 DIAGNOSIS — I10 Essential (primary) hypertension: Secondary | ICD-10-CM | POA: Insufficient documentation

## 2010-11-13 DIAGNOSIS — F29 Unspecified psychosis not due to a substance or known physiological condition: Secondary | ICD-10-CM | POA: Insufficient documentation

## 2010-11-13 DIAGNOSIS — D649 Anemia, unspecified: Secondary | ICD-10-CM | POA: Insufficient documentation

## 2010-11-13 DIAGNOSIS — J449 Chronic obstructive pulmonary disease, unspecified: Secondary | ICD-10-CM | POA: Insufficient documentation

## 2010-11-13 DIAGNOSIS — Z8673 Personal history of transient ischemic attack (TIA), and cerebral infarction without residual deficits: Secondary | ICD-10-CM | POA: Insufficient documentation

## 2010-11-13 DIAGNOSIS — K449 Diaphragmatic hernia without obstruction or gangrene: Secondary | ICD-10-CM | POA: Insufficient documentation

## 2010-11-13 DIAGNOSIS — J4489 Other specified chronic obstructive pulmonary disease: Secondary | ICD-10-CM | POA: Insufficient documentation

## 2010-11-13 DIAGNOSIS — I4891 Unspecified atrial fibrillation: Secondary | ICD-10-CM | POA: Insufficient documentation

## 2010-11-13 DIAGNOSIS — Z7901 Long term (current) use of anticoagulants: Secondary | ICD-10-CM | POA: Insufficient documentation

## 2010-11-13 DIAGNOSIS — R609 Edema, unspecified: Secondary | ICD-10-CM | POA: Insufficient documentation

## 2010-11-13 DIAGNOSIS — R0602 Shortness of breath: Secondary | ICD-10-CM | POA: Insufficient documentation

## 2010-11-13 LAB — BASIC METABOLIC PANEL
BUN: 9 mg/dL (ref 6–23)
Calcium: 8.8 mg/dL (ref 8.4–10.5)
Creatinine, Ser: 0.61 mg/dL (ref 0.4–1.2)
GFR calc non Af Amer: >60 mL/min (ref >60)
Glucose, Bld: 99 mg/dL (ref 70–99)
Sodium: 138 mEq/L (ref 135–145)

## 2010-11-13 LAB — URINALYSIS, ROUTINE W REFLEX MICROSCOPIC
Bilirubin Urine: NEGATIVE
Glucose, UA: NEGATIVE mg/dL
Ketones, ur: NEGATIVE mg/dL
Protein, ur: NEGATIVE mg/dL

## 2010-11-13 LAB — DIFFERENTIAL
Basophils Absolute: 0.1 10*3/uL (ref 0.0–0.1)
Basophils Relative: 0 % (ref 0–1)
Eosinophils Relative: 4 % (ref 0–5)
Lymphocytes Relative: 8 % — ABNORMAL LOW (ref 12–46)
Neutro Abs: 9.2 10*3/uL — ABNORMAL HIGH (ref 1.7–7.7)

## 2010-11-13 LAB — POCT CARDIAC MARKERS
CKMB, poc: 1 ng/mL (ref 1.0–8.0)
Myoglobin, poc: 34 ng/mL (ref 12–200)

## 2010-11-13 LAB — CBC
HCT: 30.6 % — ABNORMAL LOW (ref 36.0–46.0)
Platelets: 368 10*3/uL (ref 150–400)
RDW: 14.6 % (ref 11.5–15.5)
WBC: 12 10*3/uL — ABNORMAL HIGH (ref 4.0–10.5)

## 2010-11-13 MED ORDER — IOHEXOL 300 MG/ML  SOLN
100.0000 mL | Freq: Once | INTRAMUSCULAR | Status: AC | PRN
  Administered 2010-11-13: 100 mL via INTRAVENOUS

## 2010-11-16 NOTE — Discharge Summary (Signed)
Angelica [name], Angelica [name]            ACCOUNT NO.:  [id number]   MEDICAL RECORD NO.:  [id number]          PATIENT TYPE:  INP   LOCATION:  1401                         FACILITY:  [facility]   PHYSICIAN:  Hind I Elsaid, MD      DATE OF BIRTH:  Apr 26, 1923   DATE OF ADMISSION:  01/28/2009  DATE OF DISCHARGE:                               DISCHARGE SUMMARY   DISCHARGE DIAGNOSES:  1. Right hip intertrochanteric fracture status post intramedullary      nailing.  2. Anemia with guaiac positive stool felt to be secondary to      arteriovenous malformation,  with  no further gastrointestinal      workup.  3. History of gastrointestinal bleeding.  4. Hypertension.  5. History of chronic obstructive pulmonary disease/asthma.  6. History of atrial fibrillation.  Rate under control on chronic      anticoagulation.  7. History of left insular infarct.  8. History of left brain stroke in 2009.  9. Subfrontal mass possible meningioma.  MRI cannot be done secondary      to intramedullary nailing.   DISCHARGE MEDICATIONS:  1. Cardizem 180 mg p.o. daily.  2. Ferrous sulfate 325 mg p.o. b.i.d.  3. Vitamin D3 one tablet daily.  4. Os-Cal 500 mg p.o. b.i.d.  5. Hydrochlorothiazide 25 mg p.o. daily.  6. Zocor 20 mg p.o. daily.  7. Singulair 10 mg p.o. daily.  8. Mucinex 600 mg p.o. b.i.d.  9. Albuterol inhaler q.6 h. p.r.n.  10.Pulmicort inhaler 0.5 mg twice daily.  11.Protonix 40 mg p.o. daily.  12.Pyridoxine 100 mg p.o. daily.  13.Coumadin as per pharmacy.   CONSULTATIONS:  Orthopedics and GI consulted.   PROCEDURE:  1. X-ray __________ vascular congestion with mild chronic bronchitic      change.  2. X-ray of the hip:  Comminuted displaced  and angulated      intertrochanteric fracture of the proximal right femur.  3. CT head old left parietal infarct,  3.5 cm probable meningioma at      the front of the anterior cranial fossa.  4. CT head without contrast old left parietal and inguinal  insular      infarct.  Subfrontal mass unchanged.  Pansinusitis.   HOSPITAL COURSE:  This is a 77 year old pleasant female who sustained  fall overnight followed by right hip pain and hands who was brought to  the emergency room.  She was found to have right hip fracture.  Orthopedics consulted.   1. Right hip fracture.  The patient found to have right hip fracture.      Plan was IM nailing.  The patient was on Coumadin and INR was 2.8.      The patient's  INR reversed with FFP and current INR down and      patient went to the surgery without any complications.  As the      patient has a history of A-fib, the patient started on heparin drip      and is subsequently started on Coumadin.  During hospital stay, the      patient has __________ which felt  to be secondary to pain      medications and dementia.  CT head did show old infarct and      subfrontal mass which is about 3.5 cm which is possibly secondary      to __________.  On reviewing her MRI done on December 20, 2007, found      to have 3 cm  cranium and sphenoidal meningioma.  The patient was      asymptomatic.  During hospital stay, also recommended PT and OT.      The patient planned for either discharge to SNF with rehab or      inpatient rehab.  Inpatient consultation at this time is pending.  2. Altered mental status felt to be secondary to delirium  versus      dementia.  CT head as we mentioned negative for any acute event.      Narcotic discontinued.  The patient during the hospital stay      significantly improved.  At this time, the patient is completely      alert and oriented.  3. Anemia.  The patient noted to have __________ hemoglobin.  The      patient has a history of GI bleeding.  Hemoglobin today is 8.8.      Guaiac stool was positive.  As we mentioned, the patient is on      Coumadin.  INR is 2.  We have to hold the Coumadin for a period of      time.  GI consulted.  Will recommend conservative management.  The       patient was not followed by a GI workup, and the patient has a      history of AVM and history of previous bleeding on Coumadin.  The      patient received frequent IV iron in the past.  As per      Gastroenterology, no further workup and conservative management.      According , the patient will receive 1 unit of blood transfusion      today.  Coumadin will be started.  We recommend nursing home for      frequent H and H checkup and transfuse as patient needed.  The      patient high risk of stroke as the patient has a history of A-fib,      in addition to history for stroke.  So, we will resume Coumadin and      will recommend conservative management of her  hemoglobin.  The      patient can resume her iron supplement IV as outpatient.  This plan      of care was discussed with daughter and the son.  4. Hypertension.  Remains under good control.  5. A-fib.  Heart rate remains under good control.   DISPOSITION:  The patient is planning either inpatient rehab pending  patient consult, from inpatient rehab versus SNF with rehab.  At this  time, the daughter did not give [place] any decision.      Hind Bosie Helper, MD  Electronically Signed     HIE/MEDQ  D:  02/04/2009  T:  02/04/2009  Job:  [id number]

## 2010-11-16 NOTE — Consult Note (Signed)
NAMEHANNAN, Angelica [name]            ACCOUNT NO.:  [id number]   MEDICAL RECORD NO.:  [id number]          PATIENT TYPE:  INP   LOCATION:  1401                         [facility]ACILITY:  [facility]   PHYSICIAN:  John C. Madilyn Fireman, M.D.    DATE OF BIRTH:  30-Mar-1923   DATE OF CONSULTATION:  DATE OF DISCHARGE:                                 CONSULTATION   REASON FOR CONSULTATION:  Heme-positive stools and anemia.   HISTORY OF PRESENT ILLNESS:  The patient is an 77 year old white female  who presented with a fall and right hip fracture status post surgery on  July 29 and also with a history of CVA who had a hemoglobin on admission  of 10.3 which on the day of surgery fell to 8.7.  She was given 2 units  of packed red blood cells with appropriate response to 10.4, but her  hemoglobin has since drifted back to 9.1.  There has been no reported  dark or bloody stools, but she did have 1 Hemoccult done which was  positive.  The patient has been a bit confused, and may have some  cognitive defects related to old CVAs, but is not aware of any bloody  stools and denies any other GI symptoms.  There is a mention on her  chart that she had an EGD and colonoscopy approximately 4 years ago in  [place], but I do not have any details or confirmation on this.  I  was unsuccessful in locating a contact number for her daughter, [name], who is her health care power of attorney.  The patient has been  on Coumadin, and this is currently on hold.  She is on heparin in the  interim.  Her BUN and creatinine have been normal.   PAST MEDICAL HISTORY:  1. Previous left-sided CVA.  2. History of atrial fibrillation.  3. Reportedly has had slow GI bleeding on Coumadin in the past      according to the chart.  4. Hypertension.  5. COPD.  6. Asthma.   MEDICATIONS ON ADMISSION:  Cardizem, Humibid, Singulair, lisinopril,  iron, albuterol nebulizers.   ALLERGIES:  None known.   FAMILY HISTORY:  Negative for GI  malignancy.   SOCIAL HISTORY:  The patient lives in [place] with husband who has  Alzheimer's.  Her daughter is a Engineer, civil (consulting) who works at [employer].   PHYSICAL EXAMINATION:  Moderately obese white female in no acute  distress.  HEART:  Irregularly irregular without murmurs, gallops or rubs.  LUNGS:  Clear.  ABDOMEN:  Soft, normoactive bowel sounds.  No hepatosplenomegaly, mass  or guarding.   IMPRESSION:  Heme-positive stools and anemia on Coumadin.  Degree/rate  of gastrointestinal bleeding unclear.   PLAN:  Would like to discuss her previous history regarding EGDs and  colonoscopies as well as possible GI bleeding with her daughter before  deciding how to proceed.  Will treat for potential upper GI sources of  bleeding with proton pump inhibitor which she has already been started  on, and will follow with you.  ______________________________  Everardo All Madilyn Fireman, M.D.     JCH/MEDQ  D:  02/03/2009  T:  02/03/2009  Job:  [id number]

## 2010-11-16 NOTE — Discharge Summary (Signed)
NAMEKELSEE, Angelica [name]            ACCOUNT NO.:  [id number]   MEDICAL RECORD NO.:  [id number]          PATIENT TYPE:  INP   LOCATION:  3031                         FACILITY:  [facility]   PHYSICIAN:  Pramod P. Pearlean Brownie, MD    DATE OF BIRTH:  1922/07/21   DATE OF ADMISSION:  12/20/2007  DATE OF DISCHARGE:  12/24/2007                               DISCHARGE SUMMARY   DIAGNOSES AT THE TIME OF DISCHARGE:  1. Left insular infarct with resultant aphasia, mildly improved on      aspirin and Plavix, enrolled in medical arm of SENTIS trial.  2. History of left brain stroke.  3. Atrial fibrillation, not a Coumadin candidate secondary to anemia      and history of gastrointestinal bleed.  4. Hypertension.  5. Chronic obstructive pulmonary disease.  6. Asthma.  7. Chronic anemia, followed by hematologist with her last Venofer      treatment on October 27, 2006.  Her last visit with him was on September 05, 2007, due for followup in June 2009 for CBC, ferritin, SMA, and      homocystine.  Her normal hemoglobin range is 4.2-8.3 since being      followed by the hematologist.   MEDICINES AT THE TIME OF DISCHARGE:  1. Prevacid 30 mg a day.  2. Lisinopril/hydrochlorothiazide 20/12.5 mg b.i.d.  3. Cardizem 180 mg a day.  4. Singulair 10 mg a day.  5. Humibid 2 tablets a day.  6. Nasonex 2 sprays b.i.d.  7. Allegra 180 mg a day.  8. Advair twice a day.  9. Iron 150 mg a day.  10.Vitamin D 1600 mg a day.  11.B12 supplement once a day.  12.Aspirin 81 mg a day.  13.Plavix 75 mg a day.  14.Thickener for her liquids for nectar-thick consistency.   STUDIES PERFORMED:  1. CT of the brain on admission shows no acute abnormality, age-      appropriate cerebral atrophy with probable remote ischemia in the      left parietal lobe.  2. Chest x-ray shows no acute abnormality.  3. MRI of the brain shows 3-cm region of acute infarct in the insular      region on the left, minimal petechial products, a 3-cm  planum      sphenoidale meningioma, and old stroke left parietal lobe.  4. MRA of the head shows no proximal vessel stenosis or occlusion,      missing insular branches in left MCA.  5. Followup CT of the head on December 21, 2007 shows expected interval      evolution of left anterior insular frontal operculum infarct.  No      definite associated hemorrhage, no significant mass effect,      underlying chronic ischemia, planum sphenoidale meningioma, better      visualized on MRI, and paranasal sinus inflammation.  6. EKG shows atrial fibrillation, rate controlled.  7. A 2-D echocardiogram shows EF of 60% with no obvious source of      embolus.  8. Transcranial Doppler completed, results pending.  9. Carotid Doppler shows no  ICA stenosis, vertebral artery flow      antegrade.   LABORATORY STUDIES:  Cholesterol 110, triglycerides 37, HDL 39, and LDL  64.  Urinalysis negative.  Hemoglobin A1c 4.9.  Homocystine 7.8.  Alcohol level less than 5.  Coags normal.  Hemoglobin 8.4, hematocrit  27.1, red blood cells 3.59, MCV 75.4, RDW 23.8 and eosinophil 9,  otherwise normal.  Chemistry with glucose 131, total protein 5.7,  albumin 3.4, and liver function tests normal.   HISTORY OF PRESENT ILLNESS:  Angelica [name] is an 77 year old  Caucasian female with a past medical history which includes a previous  stroke, who resides in [place].  She has been at [employer] and  traveling home and visiting her daughter in [place].  The patient had  dinner with her family in [place] on Saturday night and finished up  around 7:30 to 7:45.  She went to another room to take her breathing  treatment.  Her daughter knows she was able to manipulate the breathing  treatment appropriately.  However, her family checked on her afterwards  at about 8:30 and she was unable to speak.  She had been moving all her  extremities fairly well, but could not respond to commands.  Since  arrival at the emergency  room, her symptoms were stable.  She was last  seen normal at 7:30 a.m.  Code stroke was called.  The patient had a  history of atrial fibrillation, but was not on Coumadin secondary to  anemia and GI bleeding.  She is out of the window for t-PA.  She is a  candidate for the SENTIS study.  She was enrolled in that and was  treated in the medical arm.  She was admitted to the hospital for  further stroke evaluation.   HOSPITAL COURSE:  Stroke with aphasia continued throughout  hospitalization and remained prominent deficit at the time of discharge.  She was placed on aspirin and Plavix for atrial fibrillation and  secondary stroke prevention as she is not a warfarin candidate.  She  does have a hematologist and has been followed by him for quite some  time at [facility] in [place], [place].  Her typical  hemoglobin range is 4.2-8.3, and she is due for followup there in June  2009 for blood work.  She has received Venofer in the past with her last  treatment, October 27, 2006.  As daughter lives in [place], she plans  to take her mother and father home with her.  At time of discharge, we  have arranged home health followup to assist them.  She is not severe  enough for inpatient rehab facility.  She will follow up with Dr. Delia Heady at his office for the [facility] trial within 30 days.   CONDITION AT DISCHARGE:  Mild improvement in speech and language, awake,  alert, globally aphasic with expressive greater than receptive.  She  follows simple commands and gestures.  Movements are full.  Her right  lower face is weak.  She has no field cut, no drift, and no focal limb  weakness.  Her NIH stroke scale at discharge was 6, and her Barthel was  60.   DISCHARGE PLAN:  1. Discharge home with daughter in [place].  2. Aspirin and Plavix for secondary stroke prevention.  3. Followup SENTIS trial with Dr. Pearlean Brownie in 30 days.  4. Home health PT/OT, speech, nurse, and aide.  5.  The patient has been advised  to get a primary care physician in      [place].  6. Dysphasia III nectar-thick liquid diet.      Annie Main, N.P.    ______________________________  Sunny Schlein. Pearlean Brownie, MD    SB/MEDQ  D:  12/24/2007  T:  12/25/2007  Job:  [id number]   cc:   Letta Pate. Forward, MD  Emogene Morgan, MD  Dr. Cecil Cobbs

## 2010-11-16 NOTE — H&P (Signed)
NAMECHARLOTTA, LAPAGLIA            ACCOUNT NO.:  [id number]   MEDICAL RECORD NO.:  [id number]          PATIENT TYPE:  INP   LOCATION:  3031                         FACILITY:  [facility]   PHYSICIAN:  Casimiro Needle L. Reynolds, M.D.DATE OF BIRTH:  1922/08/11   DATE OF ADMISSION:  12/19/2007  DATE OF DISCHARGE:                              HISTORY & PHYSICAL   CHIEF COMPLAINT:  Not talking.   HISTORY OF PRESENT ILLNESS:  This is the initial [facility] admission for this 77 year old woman with a past medical history,  which includes previous stroke, who is in the area from [place]  visiting her daughter.  The patient had dinner with her family, sat and  finished up around 7:30 to 7:45 p.m.  She then went into another room to  take her breathing treatment.  Her daughter knows that she was able to  manipulate her breathing treatment appropriately.  However, her family  checked on her afterwards at about 8:30, and then she was unable to  speak.  She had been moving all her extremities fairly well, but could  not respond to commands.  Since, she has come into the emergency  department, her symptom is stable.  The last time she was talking was  somewhere around 7:30 to 7:45 p.m.  Code stroke was called and arrived  at the emergency department, and the patient was evaluated in the ED for  admission to stroke service and possible intervention.   PAST MEDICAL HISTORY:  Remarkable for previous left brain stroke.  She  has a known history of atrial fibrillation, but does not anticoagulate  secondary to history of chronic anemia with lots of iron and B12  deficiency elements, as well as history of slow GI bleeding on Coumadin  in the past.  Other medical problems include hypertension and history of  COPD/ asthma.   MEDICATIONS:  She is vacationing with her pills in a box, it is unclear  exactly what all of these loose pills are.  They seem to be aspirin,  iron, lisinopril, Singulair,  Humibid, possibly Cardizem.  She also takes  albuterol nebulizers.   ALLERGIES:  No known drug allergies.   FAMILY HISTORY:  Noncontributory.   SOCIAL HISTORY:  Lives in [place] with her husband who has  Alzheimer's.  She is here visiting her daughter, who works as a Engineer, civil (consulting)  here in the [employer].  She uses 1-2 alcoholic beverages at night.  Denies using tobacco or illicit drugs.   REVIEW OF SYSTEMS:  Limited secondary to her aphasia.  According to  family, she has some chronic shortness of breath, no other definite  recent symptoms.   PHYSICAL EXAMINATION:  VITAL SIGNS:  Temperature 98.2, blood pressure  148/75, pulse 110, respirations 16, and O2 sat 99% on room air.  GENERAL:  This is a healthy-appearing woman supine on the hospital bed  with no evidence of distress.  HEAD:  Cranium is normocephalic and atraumatic.  Oropharynx is benign.  NECK:  Supple without carotid bruits.  CHEST:  Clear to auscultation bilaterally.  HEART:  Irregularly irregular rhythm.  No murmurs.  ABDOMEN:  Soft.  Normoactive bowel sounds.  NEUROLOGIC:  Mental status, she is awake and alert.  She follows few one-  step commands, but not consistently.  She does not follow more complex  commands and does not make any evidence of speech.  Cranial nerves:  Pupils are equal and reactive.  On extraocular movements, she has some  difficulty looking to the right.  Visual field is full with  confrontation.  She has a right facial droop with slight weakness.  Tongue and palate move symmetrically.  Motor:  Normal bulk and tone.  No  drift.  She has slight weakness in the right hand.  Sensory:  She  withdraws to pain in all extremities.  Gait is deferred.  Toes are  downgoing.  NIH stroke score scale is 11.   LABORATORY REVIEWS:  CBC:  White count 6.0, hemoglobin 8.4, and platelet  363,000.  Electrolytes were normal except for an elevated glucose of  131.  LFTs were normal.  Coags were normal.  CT of the head  demonstrates  old left parietal infract without any acute finding.  EKG demonstrates  atrial fibrillation without acute injury occurrence.   IMPRESSION:  Acute left brain cardioembolic stroke with resultant  aphasia of Broca's type.   PLAN:  She is out of the window for intravenous tPA , and out of the  window for practically receiving intraarterial tPA.  However, she is a  candidate for the SENTIS study.  Her family is considering enrolling her  in that.  Outside of that, she will receive usual routine stroke care  including MRI, MRA, carotid, transcranial Doppler, 2D echocardiograms,  and stroke labs.  Given that she is persistently anemic, it is unlikely  to give any anticoagulator even as prior to this episode.  Stroke  service to followup.      Michael L. Thad Ranger, M.D.  Electronically Signed     MLR/MEDQ  D:  12/20/2007  T:  12/20/2007  Job:  [id number]

## 2010-11-16 NOTE — Op Note (Signed)
NAMESHENELLE, KLAS            ACCOUNT NO.:  [id number]   MEDICAL RECORD NO.:  [id number]          PATIENT TYPE:  INP   LOCATION:  1401                         [facility]ACILITY[facility]   PHYSICIAN:  Leonides Grills, M.D.     DATE OF BIRTH:  [birth year]   DATE OF PROCEDURE:  01/29/2009  DATE OF DISCHARGE:                               OPERATIVE REPORT   PREOPERATIVE DIAGNOSIS:  Right intertrochanteric fracture.   POSTOPERATIVE DIAGNOSIS:  Right intertrochanteric fracture.   OPERATION:  IM nailing right intertrochanteric fracture.   ANESTHESIA:  General.   SURGEON:  Leonides Grills, M.D.   ASSISTANT:  None.   ESTIMATED BLOOD LOSS:  Approximately 200 mL.   COMPLICATIONS:  None.   IMPLANT:  DePuy.   DISPOSITION:  Stable to the PR.   INDICATIONS:  This is an 77 year old female who sustained a right  intertrochanteric comminuted right femur fracture.  She was consented  for the procedure by her daughter who is power of attorney.  All risks,  which include infection, nerve or vessel injury, nonunion, malunion,  hardware irritation, hardware failure, persistent pain, worse pain,  prolonged recovery, stiffness, arthritis, screw head cutout, possibility  of conversion to a total hip arthroplasty were all explained and  questions were encouraged and answered.   DESCRIPTION OF PROCEDURE:  The patient was taken to the operating room  and placed in the supine position after adequate general anesthesia was  administered, as well as Ancef 1 gram IV piggyback.  The patient was  then transferred to the fracture table.  All bony prominences were well  padded.  The left lower extremity was placed in lithotomy position.  The  right lower extremity ankle was well padded and placed into distraction  device and distraction and slight internal rotation was applied.  We  then obtained C-arm views to view the fracture and it was near anatomic  position.  We then prepped and draped the right hip in a sterile  manner.  Under C-arm guidance, the tip of the greater trochanter was identified.  A longitudinal incision approximately 3 cm made.  Guidewire was placed  through the tip of the greater trochanter.  This was then visualized  under C-arm guidance in the AP and lateral planes to be in adequate  position.  We then reamed over the guidewire to lesser trochanter.  We  then placed a 135 degree 200 mm x 11 mm intertrochanteric nail.  This  was slid down the canal and tapped into place under C-arm guidance in  the AP and lateral planes.  We then placed in a lag screw through the  outrigger through a separate incision.  This was done through the  guidepin first followed by drill and then tapped.  We chose a 105 mm x  10.5 mm lag screw by DePuy.  This was then placed and the fracture was  compressed.  We then obtained x-rays in the AP and lateral plane that  showed this was in excellent position and it was actually aiding in  reducing the slight anterior translation of the neck to anatomic  position.  We  then placed the distal locking screw through the  outrigger.  This was done through a separate incision.  Again, this was  done first drilling, measuring and then screw was placed.  We then  removed the outrigger and obtained final x-rays in the AP and lateral  planes.  This showed excellent alignment of fracture, fixation,  proposition as well.  The wounds were copiously irrigated with normal  saline.  The IT band was closed with 0 Vicryl.  The subcu was closed  with 2-0 Vicryl.  The skin was closed with staples.  A sterile dressing  was applied.  The patient was stable to the PR.      Leonides Grills, M.D.  Electronically Signed     PB/MEDQ  D:  01/29/2009  T:  01/30/2009  Job:  [id number]

## 2010-11-16 NOTE — H&P (Signed)
NAMESKII, CLELAND            ACCOUNT NO.:  [id number]   MEDICAL RECORD NO.:  [id number]          PATIENT TYPE:  INP   LOCATION:  0101                         FACILITY:  [facility]   PHYSICIAN:  Marcellus Scott, MD     DATE OF BIRTH:  04-30-1923   DATE OF ADMISSION:  01/28/2009  DATE OF DISCHARGE:                              HISTORY & PHYSICAL   PRIMARY MEDICAL DOCTOR:  Unassigned.  However, the patient will see Dr.  Juline Patch.   CHIEF COMPLAINT:  Right hip pain after a fall.   HISTORY OF PRESENT ILLNESS:  Ms. Kroeker is a very pleasant 77 year old  Caucasian female patient who sustained a fall overnight followed by  right hip pain and hence was brought to the emergency room for further  evaluation.  She is confirmed to have a right hip fracture.  [facility] were asked to admit the patient.  The patient is unable to  provide significant amount of history secondary to sedation from pain  medications and possible cognitive impairment from her stroke sustained  last year.  Most of the history is obtained in speaking with the  patient's daughter and health care power of attorney, Ms. [name].   Ms. Romanski has history of CVA sustained in June 2009 when she had a left  insular infarct with resultant aphasia.  Since then, apparently, she has  had some residual speech difficulties as well as cognitive impairment.  She has history of chronic atrial fibrillation who has been resumed on  Coumadin since August 2009.  She has history of hypertension and COPD.  She has recently moved from [place] to the [place] area three  weeks ago.  She and her husband, who has Alzheimer's dementia, live  independently.  She was in her usual state of health until approximately  5:00 a.m. when she got out of her bed to use the bathroom and sustained  a fall.  She denies any preceding chest pain, dizziness,  lightheadedness, dyspnea.  She also denies tripping on anything.  Her  husband  thinks she might have been slightly disoriented because of the  new environment or house that they have moved in.  In any event, the  patient fell and husband heard the fall and then went to help her.  She  did not lose consciousness.  She was unable to get up, so he helped her  up to the bed.  Subsequently EMS was called and the patient was brought  to the emergency room.  Currently, apart from the right hip pain, the  patient denies any other symptoms.   PAST MEDICAL HISTORY:  1. CVA with residual speech and cognitive deficits.  2. Hypertension.  3. COPD, etiology unknown.  The patient never smoked.  She is not on      home oxygen.  4. Atrial fibrillation for the last 5 years.  Resumed Coumadin in      August 2009.  5. EGD and colonoscopy approximately 4 years ago and said to be      negative.   PAST SURGICAL HISTORY:  1. Cholecystectomy.  2. Fractures  of wrist and ankle.  Unsure which side.  3. Right cataract surgery.   ALLERGIES:  AMPICILLIN CAUSES HIVES.  UNCLEAR IF SHE IS ALLERGIC TO ALL  PENICILLINS.   MEDICATIONS:  1. Centrum Silver one tablet p.o. daily.  2. Vitamin D3 2000 international units.  Unclear frequency.  3. Diltiazem hydrochloride 180 mg extended release p.o. daily.  4. Warfarin 4 mg tablet, 2 tablets p.o. on Mondays, Wednesdays,      Fridays, Saturdays, Sundays and 1-1/2 tablets on Tuesdays and      Thursdays  5. Iron 65 mg p.o. b.i.d.  6. Calcium 600 mg plus vitamin D3 p.o. b.i.d.  7. Vitamin B complex one p.o. daily.  8. Hydrochlorothiazide 25 mg p.o. daily.  9. Simvastatin 20 mg p.o. daily.  10.Fexofenadine hydrochloride 180 mg p.o. daily.  11.Singulair 10 mg p.o. daily.  12.Mucinex, unclear dosage  13.Nebulizations, inhaler two times daily as needed.   FAMILY HISTORY:  Most of her family has heart disease.  The patient's  mother had stroke, CHF, kidney problems.  The patient's father had CLL,  but died of an aneurysm.   SOCIAL HISTORY:  The  patient is married.  Her spouse is at the bedside  and has Alzheimer's dementia.  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  She is retired.  She is independent of activities of  daily living but is said to be frail.  Apart from this, fall sustained  early this morning she had a fall more than 4 years ago when she  sustained fractures of her wrist and ankle.   The patient has never smoked.  She drinksa glass of wine daily.  Prior  to her stroke last year.  She apparently drank heavily.  There is no  history of illicit drug use.   ADVANCE DIRECTIVES:  1. Health care power of attorney, [name], daughter.  2. Living will.  3. Code status: Full code.   REVIEW OF SYSTEMS:  Comprehensive 10 system review done and apart from  history of presenting illness is noncontributory.  Specifically, there  is no urinary frequency or dysuria or chest pain or dyspnea or  palpitations or dizziness or lightheadedness.  There is no history of  black stools or external overt bleeding.   PHYSICAL EXAMINATION:  GENERAL:  Ms. Kendrick is a moderately built and  nourished female patient who is slightly drowsy but easily arousable and  in no obvious distress.  VITAL SIGNS:  Temperature 97.9 degrees Fahrenheit, blood pressure 96/49  mmHg, pulse 57 per minute, irregularly irregular, respiration 18 per  minute, saturating at 94% on room air.  Head:  Nontraumatic, normocephalic.  Eyes:  Pupils equally reacting to light and accommodation.  Left eye has  immature cataract.  Extraocular muscle movement intact.  ENT: Oral cavity with slightly dry mucosa.  No oropharyngeal erythema or  thrush. ? facial asymmetry.  Tongue movements normal.  NECK:  Supple.  No JVD or carotid bruit.  LYMPHATICS:  No lymphadenopathy.  RESPIRATORY:  Fair breath sounds bilaterally.  Occasional wheezing  anteriorly and posteriorly and occasional basal crackles.  CARDIOVASCULAR:  First and second heart sounds heard.   Irregularly  irregular.  No murmurs.  ABDOMEN:  Nondistended, nontender.  No organomegaly or mass appreciated.  Bowel sounds are normally heard.  CENTRAL NERVOUS SYSTEM:  The patient is slightly drowsy, but easily  arousable and oriented in person, place and time.  Difficult to assess  fully secondary to sedation.  But no  obvious cranial nerve deficits  apart from the subtle facial asymmetry.  EXTREMITIES:  Right lower extremity is shortened and externally rotated.  Peripheral pulses are symmetrically felt.  No cyanosis, clubbing or  edema.   LABORATORY DATA:  Radiology:  CT of the head without contrast.  Impression:  Old left parietal infarct, a 3.5 cm diameter probable  meningioma floor of the anterior cranial fossa.  No definite acute  intracranial abnormalities.   X-ray of the right hip.  Impression:  Comminuted, displaced and  angulated intertrochanteric fracture of proximal right femur.   Basic metabolic panel remarkable for potassium 3.1, BUN 16, creatinine  0.83, INR is 2.8.  CBCs with hemoglobin 10.3, hematocrit 32, white blood  cells 11, platelets 247.  The previous hemoglobin on December 19, 2007, was  8.4.  According to daughter, the patient's hemoglobin usually ranges in  the mid 11 gm/dL.   EKG shows atrial fibrillation with ventricular rate at 49 beats per  minute.  Normal axis.  No other acute or chronic ischemic changes seen.   ASSESSMENT AND PLAN:  1. Right hip fracture, sustained status post fall.  Will admit the      patient to telemetry.  Hold Coumadin.  I have discussed her case      with the orthopedic team (PA) who were consulted by the ED      physician's.  They indicate that they would let the INR drift down      and plan for surgery either tomorrow or the day after and did not      recommend rapid reversing off the INR either by vitamin K or by      FFP.  They preferred to perform surgery when INR is equal to or      less than 1.6.  Will hold Coumadin and  let the INR drift down.      With the data available, the patient is at low risk for      perioperative cardiovascular events.  Will obtain a chest x-ray and      urinalysis.  2. Atrial fibrillation with controlled ventricular rate and      anticoagulated.  As indicated above, will hold Coumadin.  Continue      with oral Cardizem.  Once the INR drifts below 2 will start IV      heparin perioperatively which can be held a few hours before      surgery and resumed postop when cleared by Orthopedics, and also to      resume Coumadin postop.  3. Hypertension, controlled.  The patient has soft blood pressures at      this time.  We will briefly hydrate.  4. COPD.  Etiology unclear.  For nebulizations p.r.n. and oxygen.  5. Old CVA with cognitive deficits.  Anticoagulation management as      above.  6. Chronic anemia.  Unclear what her baseline hemoglobin is but will      monitor CBCs daily.   Addendum: Discussed with orthopedic MD who recommends reversing  anticoagulation with FFP with plan to take the patient to OR later this  evening. Will provide 2 FFP's and repeat INR.    Time spent coordinating this admission is one hour and 10 minutes.      Marcellus Scott, MD  Electronically Signed     AH/MEDQ  D:  01/28/2009  T:  01/28/2009  Job:  [id number]   cc:   Juline Patch, M.D.  Fax: [phone]  Genene Churn. Cyndie Chime, M.D.  Fax: [phone]

## 2010-11-16 NOTE — Consult Note (Signed)
[name], [name]            ACCOUNT NO.:  [id number]   MEDICAL RECORD NO.:  [id number]          PATIENT TYPE:  INP   LOCATION:  1401                         FACILITY:  [facility]   PHYSICIAN:  Leonides Grills, M.D.     DATE OF BIRTH:  10-19-1922   DATE OF CONSULTATION:  01/29/2009  DATE OF DISCHARGE:                                 CONSULTATION   CHIEF COMPLAINT:  Right hip pain.   HISTORY OF PRESENT ILLNESS:  Angelica [name] is an 77 year old white female  who fell this a.m. when she got up to go to the bathroom.  She states  she just slipped.  She injured her right hip.  Denies any loss of  consciousness, dizziness, chest pain or any other injury.   PAST MEDICAL HISTORY:  1. Anemia.  2. Asthma.  3. Atrial fibrillation.  4. COPD.  5. GERD.  6. Hypertension.  7. Iron-deficiency anemia.  8. Stroke.   PAST SURGICAL HISTORY:  Cholecystectomy.   SOCIAL HISTORY:  The patient recently moved to the area.  She lives with  her husband who has Alzheimer's.  Daughter lives in [place].   ALLERGIES:  AMPICILLIN CAUSED HIVES.   MEDICATIONS:  1. Iron.  2. Calcium.  3. Vitamin D3.  4. Simvastatin.  5. Hydrochlorothiazide.  6. Fexofenadine.  7. Singulair.  8. Diltiazem.  9. Coumadin.   X-RAYS:  1. X-rays of the right hip show an intertrochanteric fracture of the      proximal femur, comminuted and angulated.  2. Chest x-ray - cardiomegaly with mild chronic bronchiolitic change.   LABORATORY DATA:  CBC - white count 11,000, hemoglobin 10.3, hematocrit  32.2, platelets 2.7.  PT is 31.8, INR 2.8.   PHYSICAL EXAMINATION:  VITAL SIGNS:  Blood pressure is 96/49, pulse 57,  respiratory rate 18, temperature 97.9.  O2 saturation is 94%.  GENERAL:  The patient is awake and alert and cooperative.  LOWER EXTREMITIES:  Right leg shortened, externally rotated.  __________  pulse 2+ __________ intact.  Dorsi and plantar flexion intact bilateral  feet.  Positive sensation to light touch  bilateral feet.  No other gross  deformities to bilateral lower extremities.  Nontender except for right  proximal femur, bilateral lower extremities.   ASSESSMENT AND PLAN:  The patient is an 77 year old white female status  post fall with right hip intertrochanteric fracture.  Medical history  significant for atrial fibrillation on Coumadin, history of stroke,  chronic obstructive pulmonary disease, hypertension, iron-deficiency  anemia, asthma.  1. IM nailing of right hip needed in the future once the INR is      __________ 1.66.  2. The patient will follow __________ in the next 1-2 days.      Richardean Canal, P.A.      Leonides Grills, M.D.  Electronically Signed    GC/MEDQ  D:  01/28/2009  T:  01/29/2009  Job:  [id number]

## 2010-11-16 NOTE — Discharge Summary (Signed)
[name], Angelica [name]            ACCOUNT NO.:  [id number]   MEDICAL RECORD NO.:  [id number]          PATIENT TYPE:  INP   LOCATION:  1401                         FACILITY:  [facility]   PHYSICIAN:  Hind I Elsaid, MD      DATE OF BIRTH:  05/27/1923   DATE OF ADMISSION:  01/29/2009  DATE OF DISCHARGE:                               DISCHARGE SUMMARY   DATE OF DISCHARGE:  Pending.   DISCHARGE DIAGNOSES:  1. Right intertrochanteric fracture status post intramedullary nailing      on July 29.  2. History of atrial fibrillation, rate under control on chronic      Coumadin.  3. 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  4. History of a stroke diagnosed in June 2009.   Dictation ended at this point.      Hind Bosie Helper, MD  Electronically Signed     HIE/MEDQ  D:  02/04/2009  T:  02/04/2009  Job:  [id number]

## 2010-11-24 ENCOUNTER — Other Ambulatory Visit: Payer: Self-pay | Admitting: Hematology & Oncology

## 2010-11-24 ENCOUNTER — Encounter: Payer: Medicare Other | Admitting: Hematology & Oncology

## 2010-11-24 LAB — CBC WITH DIFFERENTIAL (CANCER CENTER ONLY)
BASO#: 0.1 10*3/uL (ref 0.0–0.2)
EOS%: 7.6 % — ABNORMAL HIGH (ref 0.0–7.0)
Eosinophils Absolute: 0.7 10*3/uL — ABNORMAL HIGH (ref 0.0–0.5)
HGB: 7.2 g/dL — ABNORMAL LOW (ref 11.6–15.9)
LYMPH#: 0.9 10*3/uL (ref 0.9–3.3)
NEUT#: 6.7 10*3/uL — ABNORMAL HIGH (ref 1.5–6.5)
Platelets: 417 10*3/uL — ABNORMAL HIGH (ref 145–400)
RBC: 3.14 10*6/uL — ABNORMAL LOW (ref 3.70–5.32)

## 2010-11-24 LAB — PROTIME-INR (CHCC SATELLITE)
INR: 2 (ref 2.0–3.5)
Protime: 24 Seconds — ABNORMAL HIGH (ref 10.6–13.4)

## 2010-11-25 ENCOUNTER — Other Ambulatory Visit: Payer: Self-pay | Admitting: Oncology

## 2010-11-25 ENCOUNTER — Encounter (HOSPITAL_COMMUNITY)
Admission: RE | Admit: 2010-11-25 | Discharge: 2010-11-25 | Disposition: A | Discharge status: Home / Self Care | Payer: Medicare Other | Source: Ambulatory Visit | Attending: Oncology | Admitting: Oncology

## 2010-11-25 ENCOUNTER — Ambulatory Visit (HOSPITAL_COMMUNITY): Payer: Medicare Other | Attending: Oncology

## 2010-11-25 ENCOUNTER — Encounter (HOSPITAL_BASED_OUTPATIENT_CLINIC_OR_DEPARTMENT_OTHER): Payer: Medicare Other | Admitting: Oncology

## 2010-11-25 DIAGNOSIS — R062 Wheezing: Secondary | ICD-10-CM | POA: Insufficient documentation

## 2010-11-25 DIAGNOSIS — K5521 Angiodysplasia of colon with hemorrhage: Secondary | ICD-10-CM

## 2010-11-25 DIAGNOSIS — D500 Iron deficiency anemia secondary to blood loss (chronic): Secondary | ICD-10-CM

## 2010-11-25 DIAGNOSIS — D649 Anemia, unspecified: Secondary | ICD-10-CM | POA: Insufficient documentation

## 2010-11-25 LAB — IRON AND TIBC
%SAT: 4 % — ABNORMAL LOW (ref 20–55)
TIBC: 389 ug/dL (ref 250–470)
UIBC: 375 ug/dL

## 2010-11-25 LAB — RETICULOCYTES
RBC: 3.28 10*6/uL — ABNORMAL LOW (ref 3.70–5.45)
Retic %: 2.05 % — ABNORMAL HIGH (ref 0.50–1.50)
Retic Ct Abs: 67.24 10*3/uL (ref 18.30–72.70)

## 2010-11-25 LAB — FERRITIN: Ferritin: 9 ng/mL — ABNORMAL LOW (ref 10–291)

## 2010-11-25 LAB — TYPE & CROSSMATCH - CHCC

## 2010-11-26 LAB — CROSSMATCH
ABO/RH(D): B POS
Antibody Screen: NEGATIVE
Unit division: 0

## 2010-12-02 ENCOUNTER — Encounter (HOSPITAL_BASED_OUTPATIENT_CLINIC_OR_DEPARTMENT_OTHER): Payer: Medicare Other | Admitting: Hematology & Oncology

## 2010-12-02 DIAGNOSIS — K5521 Angiodysplasia of colon with hemorrhage: Secondary | ICD-10-CM

## 2010-12-02 DIAGNOSIS — D500 Iron deficiency anemia secondary to blood loss (chronic): Secondary | ICD-10-CM

## 2010-12-03 ENCOUNTER — Encounter (HOSPITAL_COMMUNITY): Payer: Medicare Other | Attending: Oncology

## 2010-12-03 DIAGNOSIS — R062 Wheezing: Secondary | ICD-10-CM | POA: Insufficient documentation

## 2010-12-03 DIAGNOSIS — D649 Anemia, unspecified: Secondary | ICD-10-CM | POA: Insufficient documentation

## 2010-12-04 ENCOUNTER — Emergency Department (HOSPITAL_BASED_OUTPATIENT_CLINIC_OR_DEPARTMENT_OTHER): Payer: Medicare Other | Attending: Emergency Medicine

## 2010-12-04 ENCOUNTER — Emergency Department (HOSPITAL_BASED_OUTPATIENT_CLINIC_OR_DEPARTMENT_OTHER)
Admission: EM | Admit: 2010-12-04 | Discharge: 2010-12-04 | Payer: Medicare Other | Source: Home / Self Care | Attending: Emergency Medicine | Admitting: Emergency Medicine

## 2010-12-04 DIAGNOSIS — Z8739 Personal history of other diseases of the musculoskeletal system and connective tissue: Secondary | ICD-10-CM | POA: Insufficient documentation

## 2010-12-04 DIAGNOSIS — I4891 Unspecified atrial fibrillation: Secondary | ICD-10-CM | POA: Insufficient documentation

## 2010-12-04 DIAGNOSIS — I10 Essential (primary) hypertension: Secondary | ICD-10-CM | POA: Insufficient documentation

## 2010-12-04 DIAGNOSIS — R062 Wheezing: Secondary | ICD-10-CM

## 2010-12-04 DIAGNOSIS — Z79899 Other long term (current) drug therapy: Secondary | ICD-10-CM | POA: Insufficient documentation

## 2010-12-04 DIAGNOSIS — I517 Cardiomegaly: Secondary | ICD-10-CM

## 2010-12-04 DIAGNOSIS — J449 Chronic obstructive pulmonary disease, unspecified: Secondary | ICD-10-CM | POA: Insufficient documentation

## 2010-12-04 DIAGNOSIS — J4489 Other specified chronic obstructive pulmonary disease: Secondary | ICD-10-CM | POA: Insufficient documentation

## 2010-12-04 DIAGNOSIS — R0602 Shortness of breath: Secondary | ICD-10-CM | POA: Insufficient documentation

## 2010-12-04 LAB — DIFFERENTIAL
Basophils Absolute: 0.1 10*3/uL (ref 0.0–0.1)
Basophils Relative: 1 % (ref 0–1)
Eosinophils Absolute: 0.5 10*3/uL (ref 0.0–0.7)
Neutrophils Relative %: 66 % (ref 43–77)

## 2010-12-04 LAB — PROTIME-INR: Prothrombin Time: 20.9 seconds — ABNORMAL HIGH (ref 11.6–15.2)

## 2010-12-04 LAB — BASIC METABOLIC PANEL
CO2: 27 mEq/L (ref 19–32)
Calcium: 9.7 mg/dL (ref 8.4–10.5)
Creatinine, Ser: 0.7 mg/dL (ref 0.4–1.2)
GFR calc Af Amer: >60 mL/min (ref >60)

## 2010-12-04 LAB — APTT: aPTT: 31 seconds (ref 24–37)

## 2010-12-04 LAB — CBC
Platelets: 357 10*3/uL (ref 150–400)
RBC: 4.22 MIL/uL (ref 3.87–5.11)
WBC: 6 10*3/uL (ref 4.0–10.5)

## 2010-12-04 LAB — POCT OCCULT BLOOD STOOL (DEVICE): Fecal Occult Bld: POSITIVE

## 2010-12-04 LAB — PRO B NATRIURETIC PEPTIDE: Pro B Natriuretic peptide (BNP): 538.3 pg/mL — ABNORMAL HIGH (ref 0–450)

## 2010-12-07 ENCOUNTER — Encounter (HOSPITAL_COMMUNITY): Payer: Medicare Other

## 2010-12-13 ENCOUNTER — Encounter (HOSPITAL_COMMUNITY): Payer: Medicare Other

## 2010-12-21 ENCOUNTER — Other Ambulatory Visit: Payer: Self-pay | Admitting: Oncology

## 2010-12-21 ENCOUNTER — Encounter (HOSPITAL_BASED_OUTPATIENT_CLINIC_OR_DEPARTMENT_OTHER): Payer: Medicare Other | Admitting: Oncology

## 2010-12-21 DIAGNOSIS — Z7901 Long term (current) use of anticoagulants: Secondary | ICD-10-CM

## 2010-12-21 DIAGNOSIS — D649 Anemia, unspecified: Secondary | ICD-10-CM

## 2010-12-21 DIAGNOSIS — I4891 Unspecified atrial fibrillation: Secondary | ICD-10-CM

## 2010-12-21 DIAGNOSIS — Z5181 Encounter for therapeutic drug level monitoring: Secondary | ICD-10-CM

## 2010-12-21 DIAGNOSIS — D500 Iron deficiency anemia secondary to blood loss (chronic): Secondary | ICD-10-CM

## 2010-12-21 DIAGNOSIS — K5521 Angiodysplasia of colon with hemorrhage: Secondary | ICD-10-CM

## 2010-12-21 LAB — CBC WITH DIFFERENTIAL/PLATELET
BASO%: 0.3 % (ref 0.0–2.0)
Basophils Absolute: 0 10*3/uL (ref 0.0–0.1)
HCT: 36 % (ref 34.8–46.6)
HGB: 11.7 g/dL (ref 11.6–15.9)
LYMPH%: 11.6 % — ABNORMAL LOW (ref 14.0–49.7)
MCHC: 32.6 g/dL (ref 31.5–36.0)
MONO#: 0.5 10*3/uL (ref 0.1–0.9)
NEUT%: 71.7 % (ref 38.4–76.8)
Platelets: 297 10*3/uL (ref 145–400)
WBC: 5.6 10*3/uL (ref 3.9–10.3)

## 2010-12-21 LAB — COMPREHENSIVE METABOLIC PANEL
ALT: 11 U/L (ref 0–35)
BUN: 13 mg/dL (ref 6–23)
CO2: 28 mEq/L (ref 19–32)
Calcium: 9 mg/dL (ref 8.4–10.5)
Creatinine, Ser: 0.9 mg/dL (ref 0.50–1.10)
Glucose, Bld: 87 mg/dL (ref 70–99)
Total Bilirubin: 0.3 mg/dL (ref 0.3–1.2)

## 2010-12-21 LAB — LACTATE DEHYDROGENASE: LDH: 201 U/L (ref 94–250)

## 2011-01-24 ENCOUNTER — Encounter (HOSPITAL_BASED_OUTPATIENT_CLINIC_OR_DEPARTMENT_OTHER): Payer: Medicare Other | Admitting: Hematology & Oncology

## 2011-01-24 ENCOUNTER — Other Ambulatory Visit: Payer: Self-pay | Admitting: Oncology

## 2011-01-24 DIAGNOSIS — D500 Iron deficiency anemia secondary to blood loss (chronic): Secondary | ICD-10-CM

## 2011-01-24 DIAGNOSIS — K5521 Angiodysplasia of colon with hemorrhage: Secondary | ICD-10-CM

## 2011-01-24 LAB — CBC WITH DIFFERENTIAL (CANCER CENTER ONLY)
BASO%: 0.5 % (ref 0.0–2.0)
EOS%: 7.4 % — ABNORMAL HIGH (ref 0.0–7.0)
LYMPH#: 0.9 10*3/uL (ref 0.9–3.3)
MCHC: 31.8 g/dL — ABNORMAL LOW (ref 32.0–36.0)
MONO#: 0.9 10*3/uL (ref 0.1–0.9)
NEUT#: 5.4 10*3/uL (ref 1.5–6.5)
Platelets: 405 10*3/uL — ABNORMAL HIGH (ref 145–400)
RDW: 17.4 % — ABNORMAL HIGH (ref 11.1–15.7)
WBC: 7.9 10*3/uL (ref 3.9–10.0)

## 2011-01-24 LAB — PROTIME-INR (CHCC SATELLITE)
INR: 1.3 — ABNORMAL LOW (ref 2.0–3.5)
Protime: 15.6 Seconds — ABNORMAL HIGH (ref 10.6–13.4)

## 2011-01-31 ENCOUNTER — Other Ambulatory Visit: Payer: Self-pay | Admitting: Oncology

## 2011-01-31 ENCOUNTER — Encounter (HOSPITAL_BASED_OUTPATIENT_CLINIC_OR_DEPARTMENT_OTHER): Payer: Medicare Other | Admitting: Hematology & Oncology

## 2011-01-31 DIAGNOSIS — K5521 Angiodysplasia of colon with hemorrhage: Secondary | ICD-10-CM

## 2011-01-31 DIAGNOSIS — D500 Iron deficiency anemia secondary to blood loss (chronic): Secondary | ICD-10-CM

## 2011-01-31 LAB — PROTIME-INR (CHCC SATELLITE): Protime: 20.4 Seconds — ABNORMAL HIGH (ref 10.6–13.4)

## 2011-02-24 ENCOUNTER — Other Ambulatory Visit: Payer: Self-pay | Admitting: Oncology

## 2011-02-24 ENCOUNTER — Encounter: Payer: Medicare Other | Admitting: Hematology & Oncology

## 2011-02-24 DIAGNOSIS — Z7901 Long term (current) use of anticoagulants: Secondary | ICD-10-CM

## 2011-02-24 LAB — CBC WITH DIFFERENTIAL (CANCER CENTER ONLY)
Eosinophils Absolute: 0.4 10*3/uL (ref 0.0–0.5)
HCT: 31.5 % — ABNORMAL LOW (ref 34.8–46.6)
LYMPH%: 12.3 % — ABNORMAL LOW (ref 14.0–48.0)
MCH: 23.6 pg — ABNORMAL LOW (ref 26.0–34.0)
MCV: 79 fL — ABNORMAL LOW (ref 81–101)
MONO#: 0.8 10*3/uL (ref 0.1–0.9)
MONO%: 12.6 % (ref 0.0–13.0)
NEUT%: 68.6 % (ref 39.6–80.0)
Platelets: 381 10*3/uL (ref 145–400)
RBC: 3.98 10*6/uL (ref 3.70–5.32)
RDW: 20 % — ABNORMAL HIGH (ref 11.1–15.7)
WBC: 6.2 10*3/uL (ref 3.9–10.0)

## 2011-02-24 LAB — PROTIME-INR (CHCC SATELLITE): Protime: 24 Seconds — ABNORMAL HIGH (ref 10.6–13.4)

## 2011-03-31 LAB — CBC
HCT: 27.1 — ABNORMAL LOW
MCHC: 30.9
MCV: 75.4 — ABNORMAL LOW
Platelets: 363
RDW: 23.8 — ABNORMAL HIGH

## 2011-03-31 LAB — COMPREHENSIVE METABOLIC PANEL
AST: 28
CO2: 26
Chloride: 106
Creatinine, Ser: 0.84
GFR calc Af Amer: >60
GFR calc non Af Amer: >60
Total Bilirubin: 0.6

## 2011-03-31 LAB — DIFFERENTIAL
Basophils Absolute: 0.1
Eosinophils Absolute: 0.5
Lymphocytes Relative: 17
Lymphs Abs: 1
Monocytes Absolute: 0.6
Neutro Abs: 3.8

## 2011-03-31 LAB — CROSSMATCH
ABO/RH(D): B POS
Antibody Screen: NEGATIVE

## 2011-03-31 LAB — URINALYSIS, ROUTINE W REFLEX MICROSCOPIC
Bilirubin Urine: NEGATIVE
Glucose, UA: NEGATIVE
Ketones, ur: NEGATIVE
pH: 6

## 2011-03-31 LAB — LIPID PANEL
HDL: 39 — ABNORMAL LOW
LDL Cholesterol: 64
Total CHOL/HDL Ratio: 2.8
Triglycerides: 37
VLDL: 7

## 2011-03-31 LAB — URINE MICROSCOPIC-ADD ON

## 2011-04-01 LAB — CROSSMATCH
ABO/RH(D): B POS
Antibody Screen: POSITIVE

## 2011-04-26 ENCOUNTER — Encounter (HOSPITAL_BASED_OUTPATIENT_CLINIC_OR_DEPARTMENT_OTHER): Payer: Medicare Other | Admitting: Hematology & Oncology

## 2011-04-26 ENCOUNTER — Other Ambulatory Visit: Payer: Self-pay | Admitting: Oncology

## 2011-04-26 DIAGNOSIS — I4891 Unspecified atrial fibrillation: Secondary | ICD-10-CM

## 2011-04-26 DIAGNOSIS — D500 Iron deficiency anemia secondary to blood loss (chronic): Secondary | ICD-10-CM

## 2011-04-26 DIAGNOSIS — Z7901 Long term (current) use of anticoagulants: Secondary | ICD-10-CM

## 2011-04-26 DIAGNOSIS — K5521 Angiodysplasia of colon with hemorrhage: Secondary | ICD-10-CM

## 2011-04-26 LAB — CBC WITH DIFFERENTIAL (CANCER CENTER ONLY)
BASO#: 0 10*3/uL (ref 0.0–0.2)
Eosinophils Absolute: 0.4 10*3/uL (ref 0.0–0.5)
HGB: 10.3 g/dL — ABNORMAL LOW (ref 11.6–15.9)
LYMPH#: 0.7 10*3/uL — ABNORMAL LOW (ref 0.9–3.3)
MCH: 24.8 pg — ABNORMAL LOW (ref 26.0–34.0)
MONO#: 0.4 10*3/uL (ref 0.1–0.9)
MONO%: 8.4 % (ref 0.0–13.0)
NEUT#: 3.3 10*3/uL (ref 1.5–6.5)
Platelets: 339 10*3/uL (ref 145–400)
RBC: 4.15 10*6/uL (ref 3.70–5.32)
WBC: 4.9 10*3/uL (ref 3.9–10.0)

## 2011-04-26 LAB — PROTIME-INR (CHCC SATELLITE)
INR: 1.8 — ABNORMAL LOW (ref 2.0–3.5)
Protime: 21.6 Seconds — ABNORMAL HIGH (ref 10.6–13.4)

## 2011-05-10 DIAGNOSIS — I639 Cerebral infarction, unspecified: Secondary | ICD-10-CM | POA: Insufficient documentation

## 2011-05-10 DIAGNOSIS — I4821 Permanent atrial fibrillation: Secondary | ICD-10-CM | POA: Insufficient documentation

## 2011-05-10 HISTORY — DX: Cerebral infarction, unspecified: I63.9

## 2011-05-10 HISTORY — DX: Permanent atrial fibrillation: I48.21

## 2011-05-21 ENCOUNTER — Telehealth: Payer: Self-pay | Admitting: Oncology

## 2011-05-21 NOTE — Telephone Encounter (Signed)
Talked to pt gave her appt for January,r/s from December due to Epic. Pt aware of appt on 11/20 and 12/18th to draw blood

## 2011-05-23 ENCOUNTER — Telehealth: Payer: Self-pay | Admitting: Oncology

## 2011-05-24 ENCOUNTER — Ambulatory Visit: Payer: Medicare Other | Admitting: Lab

## 2011-05-24 ENCOUNTER — Other Ambulatory Visit: Payer: Self-pay | Admitting: Pharmacist

## 2011-05-24 DIAGNOSIS — I4891 Unspecified atrial fibrillation: Secondary | ICD-10-CM

## 2011-05-24 DIAGNOSIS — I639 Cerebral infarction, unspecified: Secondary | ICD-10-CM

## 2011-05-24 NOTE — Telephone Encounter (Signed)
[name] (pt daughter) called to cancel her mother's lab apt in [place] on 05/24/11. Gerrie started on a Prednisone taper (7 day) on 11/15. She was also started on a Z-pak on 05/19/11. The only other medication change is PRN Mucinex. No missed doses of Coumadin. No changes in diet. No bruising. No unusual bleeding. No problems to report.  Lab apt has been moved to 05/31/11 @ 3pm.

## 2011-06-02 ENCOUNTER — Other Ambulatory Visit: Payer: Self-pay | Admitting: Oncology

## 2011-06-02 ENCOUNTER — Other Ambulatory Visit (HOSPITAL_BASED_OUTPATIENT_CLINIC_OR_DEPARTMENT_OTHER): Payer: Medicare Other | Admitting: Lab

## 2011-06-02 DIAGNOSIS — Z7901 Long term (current) use of anticoagulants: Secondary | ICD-10-CM

## 2011-06-02 DIAGNOSIS — D500 Iron deficiency anemia secondary to blood loss (chronic): Secondary | ICD-10-CM

## 2011-06-02 DIAGNOSIS — K5521 Angiodysplasia of colon with hemorrhage: Secondary | ICD-10-CM

## 2011-06-02 DIAGNOSIS — I4891 Unspecified atrial fibrillation: Secondary | ICD-10-CM

## 2011-06-02 LAB — PROTIME-INR (CHCC SATELLITE)
INR: 2 (ref 2.0–3.5)
Protime: 24 Seconds — ABNORMAL HIGH (ref 10.6–13.4)

## 2011-06-02 LAB — CBC WITH DIFFERENTIAL (CANCER CENTER ONLY)
BASO#: 0.1 10*3/uL (ref 0.0–0.2)
Eosinophils Absolute: 0.5 10*3/uL (ref 0.0–0.5)
HGB: 10.1 g/dL — ABNORMAL LOW (ref 11.6–15.9)
LYMPH#: 0.7 10*3/uL — ABNORMAL LOW (ref 0.9–3.3)
MCH: 26.6 pg (ref 26.0–34.0)
MONO%: 12.7 % (ref 0.0–13.0)
NEUT#: 4.9 10*3/uL (ref 1.5–6.5)
Platelets: 342 10*3/uL (ref 145–400)
RBC: 3.8 10*6/uL (ref 3.70–5.32)
WBC: 7.1 10*3/uL (ref 3.9–10.0)

## 2011-06-02 LAB — POCT INR: INR: 2

## 2011-06-03 ENCOUNTER — Telehealth: Payer: Self-pay | Admitting: Pharmacist

## 2011-06-04 HISTORY — PX: TRANSTHORACIC ECHOCARDIOGRAM: SHX275

## 2011-06-05 ENCOUNTER — Telehealth: Payer: Self-pay | Admitting: Oncology

## 2011-06-05 NOTE — Telephone Encounter (Signed)
Received email from therese a SB that pt's dtr would like to be called w/appts @ [phone]. Rose s/w pt back on 11/17 re appts. I s/w dtr linda today to confirm appts for 12/18 and 1/7.

## 2011-06-06 ENCOUNTER — Ambulatory Visit (HOSPITAL_BASED_OUTPATIENT_CLINIC_OR_DEPARTMENT_OTHER): Payer: Self-pay | Admitting: Pharmacist

## 2011-06-06 DIAGNOSIS — I6350 Cerebral infarction due to unspecified occlusion or stenosis of unspecified cerebral artery: Secondary | ICD-10-CM

## 2011-06-06 DIAGNOSIS — I639 Cerebral infarction, unspecified: Secondary | ICD-10-CM

## 2011-06-06 DIAGNOSIS — I4891 Unspecified atrial fibrillation: Secondary | ICD-10-CM

## 2011-06-06 NOTE — Progress Notes (Signed)
No new meds or med changes per [name].  Completed abx & prednisone 2 weeks ago. Pt returning here 06/21/11 for labs.  [name] requested protime be drawn then.   I have set up for pt to come to [facility] on 06/21/11 after lab visit.  There is a chance that pt +/- her dtr may not come to that visit.  In which case, we will just need to call Bonita Quin w/ results & instructions as we have been doing.

## 2011-06-14 ENCOUNTER — Other Ambulatory Visit: Payer: Self-pay

## 2011-06-14 ENCOUNTER — Emergency Department (HOSPITAL_COMMUNITY): Payer: Medicare Other

## 2011-06-14 ENCOUNTER — Encounter: Payer: Self-pay | Admitting: Emergency Medicine

## 2011-06-14 ENCOUNTER — Inpatient Hospital Stay (HOSPITAL_COMMUNITY)
Admission: EM | Admit: 2011-06-14 | Discharge: 2011-06-18 | DRG: 189 | Disposition: A | Discharge status: Home / Self Care | Payer: Medicare Other | Attending: Family Medicine | Admitting: Family Medicine

## 2011-06-14 DIAGNOSIS — I509 Heart failure, unspecified: Secondary | ICD-10-CM

## 2011-06-14 DIAGNOSIS — J449 Chronic obstructive pulmonary disease, unspecified: Secondary | ICD-10-CM | POA: Diagnosis present

## 2011-06-14 DIAGNOSIS — I4821 Permanent atrial fibrillation: Secondary | ICD-10-CM | POA: Diagnosis present

## 2011-06-14 DIAGNOSIS — J9600 Acute respiratory failure, unspecified whether with hypoxia or hypercapnia: Principal | ICD-10-CM | POA: Diagnosis present

## 2011-06-14 DIAGNOSIS — F0390 Unspecified dementia without behavioral disturbance: Secondary | ICD-10-CM

## 2011-06-14 DIAGNOSIS — I4891 Unspecified atrial fibrillation: Secondary | ICD-10-CM | POA: Diagnosis present

## 2011-06-14 DIAGNOSIS — Z8673 Personal history of transient ischemic attack (TIA), and cerebral infarction without residual deficits: Secondary | ICD-10-CM

## 2011-06-14 DIAGNOSIS — E876 Hypokalemia: Secondary | ICD-10-CM | POA: Diagnosis present

## 2011-06-14 DIAGNOSIS — I639 Cerebral infarction, unspecified: Secondary | ICD-10-CM | POA: Diagnosis present

## 2011-06-14 DIAGNOSIS — J441 Chronic obstructive pulmonary disease with (acute) exacerbation: Secondary | ICD-10-CM | POA: Diagnosis present

## 2011-06-14 DIAGNOSIS — I5031 Acute diastolic (congestive) heart failure: Secondary | ICD-10-CM | POA: Diagnosis present

## 2011-06-14 HISTORY — DX: Chronic obstructive pulmonary disease, unspecified: J44.9

## 2011-06-14 HISTORY — DX: Gastro-esophageal reflux disease without esophagitis: K21.9

## 2011-06-14 HISTORY — DX: Essential (primary) hypertension: I10

## 2011-06-14 HISTORY — DX: Unspecified osteoarthritis, unspecified site: M19.90

## 2011-06-14 HISTORY — DX: Pneumonia, unspecified organism: J18.9

## 2011-06-14 HISTORY — DX: Unspecified atrial fibrillation: I48.91

## 2011-06-14 HISTORY — DX: Cerebral infarction, unspecified: I63.9

## 2011-06-14 HISTORY — DX: Heart failure, unspecified: I50.9

## 2011-06-14 LAB — POCT I-STAT, CHEM 8
Creatinine, Ser: 0.9 mg/dL (ref 0.50–1.10)
Hemoglobin: 12.2 g/dL (ref 12.0–15.0)
Sodium: 143 mEq/L (ref 135–145)
TCO2: 26 mmol/L (ref 0–100)

## 2011-06-14 LAB — PRO B NATRIURETIC PEPTIDE: Pro B Natriuretic peptide (BNP): 675.8 pg/mL — ABNORMAL HIGH (ref 0–450)

## 2011-06-14 LAB — PROTIME-INR: INR: 1.69 — ABNORMAL HIGH (ref 0.00–1.49)

## 2011-06-14 LAB — DIFFERENTIAL
Lymphocytes Relative: 5 % — ABNORMAL LOW (ref 12–46)
Lymphs Abs: 0.4 10*3/uL — ABNORMAL LOW (ref 0.7–4.0)
Neutrophils Relative %: 87 % — ABNORMAL HIGH (ref 43–77)

## 2011-06-14 LAB — TROPONIN I: Troponin I: <0.3 ng/mL (ref <0.30)

## 2011-06-14 LAB — CBC
Platelets: 261 10*3/uL (ref 150–400)
RBC: 3.97 MIL/uL (ref 3.87–5.11)
WBC: 7.7 10*3/uL (ref 4.0–10.5)

## 2011-06-14 LAB — CARDIAC PANEL(CRET KIN+CKTOT+MB+TROPI)
Relative Index: INVALID (ref 0.0–2.5)
Troponin I: <0.3 ng/mL (ref <0.30)

## 2011-06-14 MED ORDER — IPRATROPIUM BROMIDE 0.02 % IN SOLN
RESPIRATORY_TRACT | Status: AC
  Filled 2011-06-14: qty 2.5

## 2011-06-14 MED ORDER — SODIUM CHLORIDE 0.9 % IV SOLN: INTRAVENOUS | Status: DC

## 2011-06-14 MED ORDER — FUROSEMIDE 10 MG/ML IJ SOLN
40.0000 mg | Freq: Once | INTRAMUSCULAR | Status: AC
  Administered 2011-06-14: 40 mg via INTRAVENOUS
  Filled 2011-06-14: qty 4

## 2011-06-14 MED ORDER — METHYLPREDNISOLONE SODIUM SUCC 125 MG IJ SOLR
INTRAMUSCULAR | Status: AC
  Filled 2011-06-14: qty 2

## 2011-06-14 MED ORDER — POTASSIUM CHLORIDE CRYS ER 20 MEQ PO TBCR
40.0000 meq | EXTENDED_RELEASE_TABLET | Freq: Once | ORAL | Status: AC
  Administered 2011-06-14: 40 meq via ORAL
  Filled 2011-06-14: qty 2

## 2011-06-14 MED ORDER — PREDNISONE 50 MG PO TABS
60.0000 mg | ORAL_TABLET | Freq: Every day | ORAL | Status: DC
  Administered 2011-06-15 – 2011-06-16 (×2): 60 mg via ORAL
  Filled 2011-06-14: qty 1
  Filled 2011-06-14 (×2): qty 3

## 2011-06-14 MED ORDER — ALBUTEROL SULFATE (5 MG/ML) 0.5% IN NEBU
INHALATION_SOLUTION | RESPIRATORY_TRACT | Status: AC
  Filled 2011-06-14: qty 2

## 2011-06-14 NOTE — ED Notes (Signed)
Daughter of pt informed doctor that prednisone usually clears up this kind of congestion when she has had it before.

## 2011-06-14 NOTE — ED Notes (Signed)
ZOX:[id number]<AV> Expected date:06/14/11<BR> Expected time: 1:05 PM<BR> Means of arrival:Ambulance<BR> Comments:<BR> EMS 40 GC, 88 yof pneumonia

## 2011-06-14 NOTE — ED Provider Notes (Signed)
Will and will andHistory     CSN: [id number] Arrival date & time: 06/14/2011  1:35 PM   First MD Initiated Contact with Patient 06/14/11 1350      Chief Complaint  Patient presents with  . Pneumonia   Level V caveat due to altered mental status. (Consider location/radiation/quality/duration/timing/severity/associated sxs/prior treatment) Patient is a 77 y.o. female presenting with pneumonia. The history is provided by the patient.  Pneumonia This is a recurrent problem.   patient is reportedly had increasing shortness of breath today. Some cough yesterday. She is 85% on room air at home. Family states the patient gets lung problems about every 6 weeks to 4 weeks. It has been 4 weeks since her last time. She's had some increased sputum. Patient is a very poor historian. She's not on home oxygen.  Past Medical History  Diagnosis Date  . Atrial fibrillation   . Stroke   . Hypertension   . Arthritis   . GERD (gastroesophageal reflux disease)   . COPD (chronic obstructive pulmonary disease)   . PNA (pneumonia)     Past Surgical History  Procedure Date  . Hip fracture surgery     No family history on file.  History  Substance Use Topics  . Smoking status: Not on file  . Smokeless tobacco: Not on file  . Alcohol Use:     OB History    Grav Para Term Preterm Abortions TAB SAB Ect Mult Living                  Review of Systems  Unable to perform ROS: Mental status change    Allergies  Amoxicillin and Penicillins  Home Medications   Current Outpatient Rx  Name Route Sig Dispense Refill  . ALBUTEROL SULFATE (2.5 MG/3ML) 0.083% IN NEBU Nebulization Take 2.5 mg by nebulization 3 (three) times daily.      . ALENDRONATE SODIUM 70 MG PO TABS Oral Take 70 mg by mouth every 7 (seven) days. Take with a full glass of water on an empty stomach.     . BUDESONIDE 0.5 MG/2ML IN SUSP Nebulization Take 0.5 mg by nebulization 2 (two) times daily.      Marland Kitchen VITAMIN D 2000 UNITS PO  CAPS Oral Take 1 capsule by mouth daily.      Marland Kitchen DILTIAZEM HCL ER 180 MG PO CP24 Oral Take 180 mg by mouth daily.      Marland Kitchen FERROUS SULFATE 325 (65 FE) MG PO TABS Oral Take 325 mg by mouth daily.      Marland Kitchen FEXOFENADINE HCL 180 MG PO TABS Oral Take 180 mg by mouth daily.      . FUROSEMIDE 40 MG PO TABS Oral Take 40 mg by mouth daily.      . GUAIFENESIN 400 MG PO TABS Oral Take 400 mg by mouth 2 (two) times daily.      . GUAIFENESIN ER 1200 MG PO TB12 Oral Take 1 tablet by mouth once.      Marland Kitchen LANSOPRAZOLE 30 MG PO CPDR Oral Take 30 mg by mouth daily.      Carma Leaven M PLUS PO TABS Oral Take 1 tablet by mouth daily.      Marland Kitchen SIMVASTATIN 10 MG PO TABS Oral Take 10 mg by mouth at bedtime.      Marland Kitchen TIOTROPIUM BROMIDE MONOHYDRATE 18 MCG IN CAPS Inhalation Place 18 mcg into inhaler and inhale daily.      . WARFARIN SODIUM 4 MG PO TABS Oral  Take 4-6 mg by mouth daily. Tuesday, Thursday, and Saturday=1 tablet Sunday, Monday, Wednesday, and Friday=1.5 tablets  (INR MUST BE KEPT AT 2 OR PATIENT WILL HAVE A GI BLEED PER FAMILY)      BP 106/49  Pulse 99  Temp(Src) 98.9 F (37.2 C) (Oral)  Resp 24  SpO2 95%  Physical Exam  Nursing note and vitals reviewed. Constitutional: She appears well-developed and well-nourished.  HENT:  Head: Normocephalic and atraumatic.  Eyes: EOM are normal. Pupils are equal, round, and reactive to light.  Neck: Normal range of motion. Neck supple.  Cardiovascular: Normal rate, regular rhythm and normal heart sounds.   No murmur heard. Pulmonary/Chest: Effort normal. No respiratory distress. She has wheezes. She has no rales.       Mild scattered wheezes  Abdominal: Soft. Bowel sounds are normal. She exhibits no distension. There is no tenderness. There is no rebound and no guarding.  Musculoskeletal: Normal range of motion.  Neurological: She is alert. No cranial nerve deficit.       Mild confusion  Skin: Skin is warm and dry.  Psychiatric: She has a normal mood and affect. Her  speech is normal.    ED Course  Procedures (including critical care time)  Labs Reviewed  CBC - Abnormal; Notable for the following:    Hemoglobin 10.7 (*)    HCT 34.8 (*)    RDW 18.0 (*)    All other components within normal limits  DIFFERENTIAL - Abnormal; Notable for the following:    Neutrophils Relative 87 (*)    Lymphocytes Relative 5 (*)    Lymphs Abs 0.4 (*)    All other components within normal limits  PRO B NATRIURETIC PEPTIDE - Abnormal; Notable for the following:    Pro B Natriuretic peptide (BNP) 675.8 (*)    All other components within normal limits  PROTIME-INR - Abnormal; Notable for the following:    Prothrombin Time 20.2 (*)    INR 1.69 (*)    All other components within normal limits  POCT I-STAT, CHEM 8 - Abnormal; Notable for the following:    Potassium 3.2 (*)    Glucose, Bld 196 (*)    All other components within normal limits  I-STAT, CHEM 8  TROPONIN I   Dg Chest 2 View  06/14/2011  *RADIOLOGY REPORT*  Clinical Data: Short of breath  CHEST - 2 VIEW  Comparison: December 04, 2010  Findings: Cardiomegaly.  Mild diffuse edema. No pneumothorax.  No pleural effusion.  Bibasilar atelectasis and low volumes.  IMPRESSION: Mild CHF.  Original Report Authenticated By: Donavan Burnet, M.D.     1. COPD (chronic obstructive pulmonary disease)   2. CHF (congestive heart failure)     Date: 06/14/2011 riate: 100  Rhythm: atrial fibrillation  QRS Axis: normal  Intervals: normal  ST/T Wave abnormalities: normal  Conduction Disutrbances:none  Narrative Interpretation:   Old EKG Reviewed: unchanged   shortness of breath the last 2 days. History is CHF and COPD. Also recurrent pneumonias. Patient does not have a fever white count. I WILL not treat for a pneumonia at this point. She will be sent to about 90% without any oxygen. She'll be admitted to [facility]. A     MDM           Juliet Rude. Rubin Payor, MD 06/14/11 1550

## 2011-06-14 NOTE — H&P (Signed)
Hospital Admission Note Date: 06/14/2011  PCP: Thayer Headings, MD, MD  Chief Complaint: SOB  History of Present Illness: This is 77 year old with past medical history significant for COPD, prior history of pneumonia, atrial fibrillation, who presents to the emergency department accompanied by family due to worsening shortness of breath. Patient is not able to provide history due to confusion. She likely has some baseline dementia. Family was not at bedside. I tried to contact family today without any response. History was obtained from ED records and ED physician. Patient was reportedly to have increasing shortness of breath the date of admission, and worsening cough in the day prior to admission. She was found to be hypoxic at home at 85 on room air.  Allergies: Amoxicillin and Penicillins Past Medical History  Diagnosis Date  . Atrial fibrillation   . Stroke   . Hypertension   . Arthritis   . GERD (gastroesophageal reflux disease)   . COPD (chronic obstructive pulmonary disease)   . PNA (pneumonia)    Prior to Admission medications   Medication Sig Start Date End Date Taking? Authorizing Provider  albuterol (PROVENTIL) (2.5 MG/3ML) 0.083% nebulizer solution Take 2.5 mg by nebulization 3 (three) times daily.     Yes Historical Provider, MD  alendronate (FOSAMAX) 70 MG tablet Take 70 mg by mouth every 7 (seven) days. Take with a full glass of water on an empty stomach.    Yes Historical Provider, MD  budesonide (PULMICORT) 0.5 MG/2ML nebulizer solution Take 0.5 mg by nebulization 2 (two) times daily.     Yes Historical Provider, MD  Cholecalciferol (VITAMIN D) 2000 UNITS CAPS Take 1 capsule by mouth daily.     Yes Historical Provider, MD  diltiazem (DILACOR XR) 180 MG 24 hr capsule Take 180 mg by mouth daily.     Yes Historical Provider, MD  ferrous sulfate 325 (65 FE) MG tablet Take 325 mg by mouth daily.     Yes Historical Provider, MD  fexofenadine (ALLEGRA) 180 MG tablet Take 180  mg by mouth daily.     Yes Historical Provider, MD  furosemide (LASIX) 40 MG tablet Take 40 mg by mouth daily.     Yes Historical Provider, MD  guaifenesin (HUMIBID E) 400 MG TABS Take 400 mg by mouth 2 (two) times daily.     Yes Historical Provider, MD  Guaifenesin 1200 MG TB12 Take 1 tablet by mouth once.     Yes Historical Provider, MD  lansoprazole (PREVACID) 30 MG capsule Take 30 mg by mouth daily.     Yes Historical Provider, MD  Multiple Vitamins-Minerals (MULTIVITAMINS THER. W/MINERALS) TABS Take 1 tablet by mouth daily.     Yes Historical Provider, MD  simvastatin (ZOCOR) 10 MG tablet Take 10 mg by mouth at bedtime.     Yes Historical Provider, MD  tiotropium (SPIRIVA) 18 MCG inhalation capsule Place 18 mcg into inhaler and inhale daily.     Yes Historical Provider, MD  warfarin (COUMADIN) 4 MG tablet Take 4-6 mg by mouth daily. Tuesday, Thursday, and Saturday=1 tablet Sunday, Monday, Wednesday, and Friday=1.5 tablets  (INR MUST BE KEPT AT 2 OR PATIENT WILL HAVE A GI BLEED PER FAMILY)   Yes Historical Provider, MD   Past Surgical History  Procedure Date  . Hip fracture surgery    History reviewed. No pertinent family history. History   Social History  . Marital Status: Married    Spouse Name: N/A    Number of Children: N/A  .  Years of Education: N/A   Occupational History  . Not on file.   Social History Main Topics  . Smoking status: Never Smoker   . Smokeless tobacco: Never Used  . Alcohol Use: 4.2 oz/week    7 Glasses of wine per week  . Drug Use: No  . Sexually Active:    Other Topics Concern  . Not on file   Social History Narrative  . No narrative on file   Review of Systems: Pertinent items are noted in HPI. Physical Exam: Filed Vitals:   06/14/11 1336 06/14/11 1343 06/14/11 1509 06/14/11 1923  BP: 107/46  106/49 107/50  Pulse: 103  99 85  Temp: 98.9 F (37.2 C)   98.3 F (36.8 C)  TempSrc: Oral  Oral Oral  Resp: 18  24 20   SpO2: 100% 100% 95% 98%    No intake or output data in the 24 hours ending 06/14/11 2041 BP 107/50  Pulse 85  Temp(Src) 98.3 F (36.8 C) (Oral)  Resp 20  SpO2 98%  General Appearance:    Alert, pleasantly confused, no distress, appears stated age  Head:    Normocephalic, without obvious abnormality, atraumatic  Eyes:    PERRL, conjunctiva/corneas clear, EOM's intact, fundi    benign, both eyes  Ears:    Normal TM's and external ear canals, both ears  Nose:   Nares normal, septum midline, mucosa normal, no drainage    or sinus tenderness  Throat:   Lips, mucosa, and tongue normal; teeth and gums normal  Neck:   Supple, symmetrical, trachea midline, no adenopathy;    thyroid:  no enlargement/tenderness/nodules; no carotid   bruit or  positive JVD  Back:     Symmetric, no curvature, ROM normal, no CVA tenderness  Lungs:     mild crackles and rhonchi bilaterally, respirations unlabored  Chest Wall:    No tenderness or deformity   Heart:    IRRegular rate and rhythm, S1 and S2 normal, no murmur, rub   or gallop     Abdomen:     Soft, non-tender, bowel sounds active all four quadrants,    no masses, no organomegaly        Extremities:   Extremities normal, atraumatic, no cyanosis trace edema  Pulses:   2+ and symmetric all extremities  Skin:   Skin color, texture, turgor normal, no rashes or lesions  Lymph nodes:   Cervical, supraclavicular, and axillary nodes normal  Neurologic:   passive movement of all 4 , awake , oriented to person and place . Following some command .    Lab results:  [facility] 06/14/11 1442  NA 143  K 3.2*  CL 105  CO2 --  GLUCOSE 196*  BUN 10  CREATININE 0.90  CALCIUM --  MG --  PHOS --    Basename 06/14/11 1442 06/14/11 1429  WBC -- 7.7  NEUTROABS -- 6.7  HGB 12.2 10.7*  HCT 36.0 34.8*  MCV -- 87.7  PLT -- 261    Basename 06/14/11 1409  CKTOTAL --  CKMB --  CKMBINDEX --  TROPONINI <0.30   Imaging results:  Dg Chest 2 View  06/14/2011  *RADIOLOGY REPORT*   Clinical Data: Short of breath  CHEST - 2 VIEW  Comparison: December 04, 2010  Findings: Cardiomegaly.  Mild diffuse edema. No pneumothorax.  No pleural effusion.  Bibasilar atelectasis and low volumes.  IMPRESSION: Mild CHF.  Original Report Authenticated By: Donavan Burnet, M.D.   Other results: EKG:  BNP:  675.  Patient Active Hospital Problem List:  Acute respiratory failure : We'll admit patient to telemetry , is probably related to acute diastolic heart failure exacerbation and a component of COPD exacerbation. Chest x-ray with mild CHF, BNP at 675.  Patient received a dose of 40 mg IV Lasix in the emergency department. Will continue with 40 mg IV daily. He received a dose of Solu-Medrol. Will restart prednisone 60 mg by mouth daily. I will start Avelox 400 mg by mouth daily.  Strict I and O. Cycle cardiac enzymes.   CHF (congestive heart failure) (06/14/2011)  Continue with lasix. A Fib: Continue with Cardizem. Coumadin per pharmacy.  Hypokalemia: Replete with 40 meq Po times 1.  DVT prophylaxis: Already on coumadin.  Alila Sotero M.D. [facility] [phone] 06/14/2011, 8:41 PM

## 2011-06-14 NOTE — ED Notes (Signed)
Pt has COPD and takes 3 nebulizers a day but was increasingly SOB today. Hx of PNA reoccuring every 4-6 weeks and it has been 4 weeks since the last episode. Pt is warm to touch. 85% on RA. R>75min. 10mg  Albuterol and of Atrovent, 125 Solumedrol IVP in route. 99% on neb. R 22. Wheezing on R side.

## 2011-06-15 ENCOUNTER — Emergency Department (HOSPITAL_COMMUNITY): Payer: Medicare Other

## 2011-06-15 DIAGNOSIS — I369 Nonrheumatic tricuspid valve disorder, unspecified: Secondary | ICD-10-CM

## 2011-06-15 LAB — PROTIME-INR: INR: 1.31 (ref 0.00–1.49)

## 2011-06-15 LAB — CARDIAC PANEL(CRET KIN+CKTOT+MB+TROPI)
CK, MB: 2.7 ng/mL (ref 0.3–4.0)
Relative Index: INVALID (ref 0.0–2.5)
Total CK: 34 U/L (ref 7–177)
Troponin I: <0.3 ng/mL (ref <0.30)
Troponin I: <0.3 ng/mL (ref <0.30)

## 2011-06-15 LAB — COMPREHENSIVE METABOLIC PANEL
Albumin: 3.3 g/dL — ABNORMAL LOW (ref 3.5–5.2)
Alkaline Phosphatase: 80 U/L (ref 39–117)
BUN: 15 mg/dL (ref 6–23)
CO2: 29 mEq/L (ref 19–32)
Chloride: 105 mEq/L (ref 96–112)
Creatinine, Ser: 0.75 mg/dL (ref 0.50–1.10)
GFR calc non Af Amer: 75 mL/min — ABNORMAL LOW (ref >90)
Glucose, Bld: 101 mg/dL — ABNORMAL HIGH (ref 70–99)
Potassium: 4.1 mEq/L (ref 3.5–5.1)
Total Bilirubin: 0.1 mg/dL — ABNORMAL LOW (ref 0.3–1.2)

## 2011-06-15 LAB — CBC
HCT: 36.5 % (ref 36.0–46.0)
Hemoglobin: 11 g/dL — ABNORMAL LOW (ref 12.0–15.0)
MCV: 87.3 fL (ref 78.0–100.0)
RBC: 4.18 MIL/uL (ref 3.87–5.11)
RDW: 18.2 % — ABNORMAL HIGH (ref 11.5–15.5)
WBC: 7.3 10*3/uL (ref 4.0–10.5)

## 2011-06-15 MED ORDER — ALBUTEROL SULFATE (5 MG/ML) 0.5% IN NEBU
2.5000 mg | INHALATION_SOLUTION | RESPIRATORY_TRACT | Status: DC | PRN
  Administered 2011-06-15 – 2011-06-16 (×2): 2.5 mg via RESPIRATORY_TRACT
  Filled 2011-06-15: qty 0.5

## 2011-06-15 MED ORDER — WARFARIN SODIUM 6 MG PO TABS
6.0000 mg | ORAL_TABLET | Freq: Once | ORAL | Status: AC
  Administered 2011-06-15: 6 mg via ORAL
  Filled 2011-06-15: qty 1

## 2011-06-15 MED ORDER — FUROSEMIDE 40 MG PO TABS
40.0000 mg | ORAL_TABLET | Freq: Every day | ORAL | Status: DC
  Administered 2011-06-15 – 2011-06-18 (×4): 40 mg via ORAL
  Filled 2011-06-15 (×6): qty 1

## 2011-06-15 MED ORDER — FUROSEMIDE 10 MG/ML IJ SOLN
40.0000 mg | Freq: Every day | INTRAMUSCULAR | Status: DC
  Administered 2011-06-15: 40 mg via INTRAVENOUS
  Filled 2011-06-15: qty 4

## 2011-06-15 MED ORDER — AZITHROMYCIN 500 MG PO TABS
500.0000 mg | ORAL_TABLET | Freq: Every day | ORAL | Status: AC
  Administered 2011-06-15 – 2011-06-17 (×3): 500 mg via ORAL
  Filled 2011-06-15 (×3): qty 1

## 2011-06-15 MED ORDER — GUAIFENESIN 400 MG PO TABS: 400.0000 mg | ORAL_TABLET | Freq: Two times a day (BID) | ORAL | Status: DC

## 2011-06-15 MED ORDER — FERROUS SULFATE 325 (65 FE) MG PO TABS
325.0000 mg | ORAL_TABLET | Freq: Every day | ORAL | Status: DC
  Administered 2011-06-15 – 2011-06-18 (×4): 325 mg via ORAL
  Filled 2011-06-15 (×6): qty 1

## 2011-06-15 MED ORDER — GUAIFENESIN 200 MG PO TABS
400.0000 mg | ORAL_TABLET | Freq: Two times a day (BID) | ORAL | Status: DC
  Administered 2011-06-15 – 2011-06-17 (×6): 400 mg via ORAL
  Filled 2011-06-15 (×8): qty 2

## 2011-06-15 MED ORDER — PANTOPRAZOLE SODIUM 20 MG PO TBEC
20.0000 mg | DELAYED_RELEASE_TABLET | Freq: Every day | ORAL | Status: DC
  Administered 2011-06-15 – 2011-06-18 (×4): 20 mg via ORAL
  Filled 2011-06-15 (×6): qty 1

## 2011-06-15 MED ORDER — SIMVASTATIN 10 MG PO TABS
10.0000 mg | ORAL_TABLET | Freq: Every day | ORAL | Status: DC
  Filled 2011-06-15: qty 1

## 2011-06-15 MED ORDER — THERA M PLUS PO TABS
1.0000 | ORAL_TABLET | Freq: Every day | ORAL | Status: DC
  Administered 2011-06-15 – 2011-06-18 (×4): 1 via ORAL
  Filled 2011-06-15 (×5): qty 1

## 2011-06-15 MED ORDER — BUDESONIDE 0.5 MG/2ML IN SUSP
0.5000 mg | Freq: Two times a day (BID) | RESPIRATORY_TRACT | Status: DC
  Administered 2011-06-15 – 2011-06-18 (×6): 0.5 mg via RESPIRATORY_TRACT
  Filled 2011-06-15 (×13): qty 2

## 2011-06-15 MED ORDER — VITAMIN D3 25 MCG (1000 UNIT) PO TABS
2000.0000 [IU] | ORAL_TABLET | Freq: Every day | ORAL | Status: DC
  Administered 2011-06-15 – 2011-06-16 (×2): 2000 [IU] via ORAL
  Filled 2011-06-15 (×3): qty 2

## 2011-06-15 MED ORDER — IPRATROPIUM BROMIDE 0.02 % IN SOLN
0.5000 mg | RESPIRATORY_TRACT | Status: DC | PRN
  Administered 2011-06-15: 0.5 mg via RESPIRATORY_TRACT
  Filled 2011-06-15: qty 2.5

## 2011-06-15 MED ORDER — MOXIFLOXACIN HCL 400 MG PO TABS: 400.0000 mg | ORAL_TABLET | Freq: Every day | ORAL | Status: DC

## 2011-06-15 MED ORDER — DILTIAZEM HCL ER 180 MG PO CP24
180.0000 mg | ORAL_CAPSULE | Freq: Every day | ORAL | Status: DC
  Administered 2011-06-15 – 2011-06-18 (×4): 180 mg via ORAL
  Filled 2011-06-15 (×6): qty 1

## 2011-06-15 MED ORDER — LORATADINE 10 MG PO TABS
10.0000 mg | ORAL_TABLET | Freq: Every day | ORAL | Status: DC
  Administered 2011-06-15 – 2011-06-18 (×4): 10 mg via ORAL
  Filled 2011-06-15 (×5): qty 1

## 2011-06-15 MED ORDER — VITAMIN D 50 MCG (2000 UT) PO CAPS: 1.0000 | ORAL_CAPSULE | Freq: Every day | ORAL | Status: DC

## 2011-06-15 NOTE — Progress Notes (Addendum)
ANTICOAGULATION CONSULT NOTE - Follow Up Consult  Pharmacy Consult for Coumadin Indication: atrial fibrillation  Allergies  Allergen Reactions  . Amoxicillin Hives  . Penicillins Hives    Patient Measurements: Adjusted Body Weight:   Vital Signs: Temp: 97.3 F (36.3 C) (12/12 0812) Temp src: Oral (12/12 0812) BP: 109/62 mmHg (12/12 0812) Pulse Rate: 79  (12/12 0812)  Labs:  Basename 06/15/11 0440 06/14/11 2103 06/14/11 1442 06/14/11 1429 06/14/11 1409  HGB -- -- 12.2 10.7* --  HCT -- -- 36.0 34.8* --  PLT -- -- -- 261 --  APTT -- -- -- -- --  LABPROT 16.5* -- -- 20.2* --  INR 1.31 -- -- 1.69* --  HEPARINUNFRC -- -- -- -- --  CREATININE -- -- 0.90 -- --  CKTOTAL 34 29 -- -- --  CKMB 2.7 2.1 -- -- --  TROPONINI <0.30 <0.30 -- -- <0.30   CrCl = 50 ml/min (normalized)   Medications:   Assessment:  77 yo female with Afib on chronic coumadin 5mg  daily  Family states that INR must stay around 2.0 or pt will develop a GI bleed  INR is currently subtherapeutic (1.31) and falling  Potential for interaction with Avelox noted   Goal of Therapy:  INR 2-3   Plan:   Patient was given a boosted dose of 6mg  this am  No more coumadin today  MD to decide if Lovenox bridge is needed  Rollene Fare 06/15/2011,10:11 AM Pager: [phone]

## 2011-06-15 NOTE — Progress Notes (Signed)
ANTICOAGULATION CONSULT NOTE - Initial Consult  Pharmacy Consult for  coumadin Indication: A-fib  Allergies  Allergen Reactions  . Amoxicillin Hives  . Penicillins Hives    Patient Measurements:     Vital Signs: Temp: 99.2 F (37.3 C) (12/12 0058) Temp src: Oral (12/12 0058) BP: 118/57 mmHg (12/12 0058) Pulse Rate: 81  (12/12 0058)  Labs:  Basename 06/14/11 2103 06/14/11 1442 06/14/11 1429 06/14/11 1409  HGB -- 12.2 10.7* --  HCT -- 36.0 34.8* --  PLT -- -- 261 --  APTT -- -- -- --  LABPROT -- -- 20.2* --  INR -- -- 1.69* --  HEPARINUNFRC -- -- -- --  CREATININE -- 0.90 -- --  CKTOTAL 29 -- -- --  CKMB 2.1 -- -- --  TROPONINI <0.30 -- -- <0.30   CrCl is unknown because there is no height on file for the current visit.  Medical History: Past Medical History  Diagnosis Date  . Atrial fibrillation   . Stroke   . Hypertension   . Arthritis   . GERD (gastroesophageal reflux disease)   . COPD (chronic obstructive pulmonary disease)   . PNA (pneumonia)     Medications:  Scheduled:    . albuterol      . budesonide  0.5 mg Nebulization BID  . cholecalciferol  2,000 Units Oral Daily  . diltiazem  180 mg Oral Daily  . ferrous sulfate  325 mg Oral Daily  . furosemide  40 mg Intravenous Once  . furosemide  40 mg Intravenous Daily  . guaiFENesin  400 mg Oral BID  . ipratropium      . loratadine  10 mg Oral Daily  . methylPREDNISolone sodium succinate      . moxifloxacin  400 mg Oral q1800  . multivitamins ther. w/minerals  1 tablet Oral Daily  . pantoprazole  20 mg Oral Q1200  . potassium chloride  40 mEq Oral Once  . predniSONE  60 mg Oral Q breakfast  . simvastatin  10 mg Oral QHS  . DISCONTD: guaifenesin  400 mg Oral BID  . DISCONTD: Vitamin D  1 capsule Oral Daily   Infusions:    . DISCONTD: sodium chloride      Assessment: 77 yo female with history of A-fib on chronic coumadin @ home- family states INR must stay around 2 or pt will develop GI  Bleed. Goal of Therapy:  INR ~ 2 (see above history GI Bleed)   Plan:   Coumadin 6 mg today. Daily PT/INR. Education   Susanne Greenhouse R 06/15/2011,3:10 AM

## 2011-06-15 NOTE — Progress Notes (Addendum)
[facility] progress note    Subjective: Confused per RN.  Was having trouble breathing and noted that her O2 sats were 88% and then her daughter brought her over here for review, as pt was not really able to help with this.  Nomrally can do for herself in terms of dress and take care of herself.Marland Kitchen  Has been at [place] [facility] for maybe 3-4 yrs. No significant fever, no decreased intake of po prior to     Objective: Vital signs in last 24 hours: Temp:  [97.3 F (36.3 C)-99.2 F (37.3 C)] 99 F (37.2 C) (12/12 1348) Pulse Rate:  [66-99] 86  (12/12 1348) Resp:  [17-24] 17  (12/12 1348) BP: (106-140)/(49-77) 131/61 mmHg (12/12 1348) SpO2:  [93 %-99 %] 99 % (12/12 1348) Weight change:   Intake/Output Summary (Last 24 hours) at 06/15/11 1415 Last data filed at 06/15/11 1351  Gross per 24 hour  Intake      0 ml  Output      0 ml  Net      0 ml   BP 131/61  Pulse 86  Temp(Src) 99 F (37.2 C) (Oral)  Resp 17  SpO2 99% General appearance: alert, cooperative and appears stated age Throat: lips, mucosa, and tongue normal; teeth and gums normal Lungs: wheezes bilaterally Heart: irregularly irregular rhythm and Tele=Rate controlled Afib Abdomen: soft, non-tender; bowel sounds normal; no masses,  no organomegaly Extremities: extremities normal, atraumatic, no cyanosis or edema  Lab Results:  [facility] 06/15/11 1052 06/14/11 1442  NA 141 143  K 4.1 3.2*  CL 105 105  CO2 29 --  GLUCOSE 101* 196*  BUN 15 10  CREATININE 0.75 0.90  CALCIUM 9.7 --  MG -- --  PHOS -- --    Basename 06/15/11 1052  AST 18  ALT 18  ALKPHOS 80  BILITOT 0.1*  PROT 6.2  ALBUMIN 3.3*   No results found for this basename: LIPASE:2,AMYLASE:2 in the last 72 hours  Basename 06/15/11 1052 06/14/11 1442 06/14/11 1429  WBC 7.3 -- 7.7  NEUTROABS -- -- 6.7  HGB 11.0* 12.2 --  HCT 36.5 36.0 --  MCV 87.3 -- 87.7  PLT 296 -- 261    Basename 06/15/11 1050 06/15/11 0440 06/14/11 2103  CKTOTAL  47 34 29  CKMB 3.4 2.7 2.1  CKMBINDEX -- -- --  TROPONINI <0.30 <0.30 <0.30   ECO 12.12 Study Conclusions  - Left ventricle: The cavity size was normal. Wall thickness was increased in a pattern of mild LVH. Systolic function was normal. The estimated ejection fraction was in the range of 55% to 60%. Wall motion was normal; there were no regional wall motion abnormalities. - Mitral valve: Calcified annulus. - Left atrium: The atrium was moderately dilated. - Right atrium: The atrium was moderately dilated. - Tricuspid valve: Moderate regurgitation. - Pulmonary arteries: Systolic pressure was mildly increased. PA peak pressure: 39mm Hg (S). Impressions:  - Oscillating density in RV on parasternal short axis views most likely papillary muscle.   Medications: I have reviewed the patient's current medications. Scheduled Meds:   . albuterol      . budesonide  0.5 mg Nebulization BID  . cholecalciferol  2,000 Units Oral Daily  . diltiazem  180 mg Oral Daily  . ferrous sulfate  325 mg Oral Daily  . furosemide  40 mg Intravenous Once  . furosemide  40 mg Intravenous Daily  . guaiFENesin  400 mg Oral BID  . ipratropium      .  loratadine  10 mg Oral Daily  . methylPREDNISolone sodium succinate      . moxifloxacin  400 mg Oral q1800  . multivitamins ther. w/minerals  1 tablet Oral Daily  . pantoprazole  20 mg Oral Q1200  . potassium chloride  40 mEq Oral Once  . predniSONE  60 mg Oral Q breakfast  . simvastatin  10 mg Oral QHS  . warfarin  6 mg Oral Once  . DISCONTD: guaifenesin  400 mg Oral BID  . DISCONTD: Vitamin D  1 capsule Oral Daily   Continuous Infusions:   . DISCONTD: sodium chloride     PRN Meds:.albuterol, ipratropium   Assessment/Plan: Patient Active Hospital Problem List: CHF (congestive heart failure) (06/14/2011)   Assessment: Likely small component of her issues-Will hold IVf, diurese with lasix Po 40 qam and reassess ECHO as per above=EF  55-60%  COPD (chronic obstructive pulmonary disease) (06/14/2011)   Assessment: Desatting in ED to 86%, not keeping on O2/tele-will neb and reassess Will continue steroids and Ipratropium and Albuterol Will change D#2 Moxifloxacin to Azithromycin for a total of 5 days Usually has this issue q 4-6 weeks   AFib-On coumadin per pharmacy a-rate controlled. Continue Dilatiazem  Hypokalemia-replaced-bmet am   LOS: 1 day   [place] Duerst,JAI 06/15/2011, 2:15 PM    I have fully updated family as to the course of their family members care and have answered all questions that they had

## 2011-06-15 NOTE — ED Notes (Signed)
Patient is resting comfortably. 

## 2011-06-15 NOTE — ED Notes (Signed)
Vital signs stable. 

## 2011-06-15 NOTE — ED Notes (Signed)
Patient denies pain and is resting comfortably.  

## 2011-06-15 NOTE — Progress Notes (Signed)
  Echocardiogram 2D Echocardiogram has been performed.  Jorje [facility] 06/15/2011, 8:57 AM

## 2011-06-15 NOTE — ED Notes (Signed)
Pt with audible wheezing phoned NP stated she would put in orders will continue to monitor.

## 2011-06-16 DIAGNOSIS — F0390 Unspecified dementia without behavioral disturbance: Secondary | ICD-10-CM

## 2011-06-16 HISTORY — DX: Unspecified dementia, unspecified severity, without behavioral disturbance, psychotic disturbance, mood disturbance, and anxiety: F03.90

## 2011-06-16 LAB — CBC
MCH: 26.5 pg (ref 26.0–34.0)
MCHC: 30.3 g/dL (ref 30.0–36.0)
MCV: 87.5 fL (ref 78.0–100.0)
Platelets: 327 10*3/uL (ref 150–400)
RBC: 4.41 MIL/uL (ref 3.87–5.11)

## 2011-06-16 LAB — PROTIME-INR
INR: 1.36 (ref 0.00–1.49)
Prothrombin Time: 17 seconds — ABNORMAL HIGH (ref 11.6–15.2)

## 2011-06-16 MED ORDER — WARFARIN SODIUM 6 MG PO TABS
6.0000 mg | ORAL_TABLET | Freq: Once | ORAL | Status: AC
  Administered 2011-06-16: 6 mg via ORAL
  Filled 2011-06-16: qty 1

## 2011-06-16 MED ORDER — PREDNISONE 20 MG PO TABS
20.0000 mg | ORAL_TABLET | Freq: Every day | ORAL | Status: DC
  Administered 2011-06-17: 20 mg via ORAL
  Filled 2011-06-16: qty 1

## 2011-06-16 NOTE — Progress Notes (Signed)
Received a call from Angelica [name], [place]. Pt having frequent PVC's. Went to room an assessed pt. She is having a coughing spell but otherwise asymptomatic. HR 97. Pt denies any CP or discomfort. Eating breakfast. Notified Dr. Kizzie Bane via paging system. Will continue to closely monitor.

## 2011-06-16 NOTE — Progress Notes (Signed)
Pt resting comfortably. No further PVC's at the moment. Pt denies any complications.

## 2011-06-16 NOTE — Progress Notes (Signed)
Attempted to speak with Pt.  Pt pleasantly confused.  Contacted daughter, Bonita Quin, at Pt's residence.  Daughter is parent's full-time, paid caretaker.  Daughter with parents all day until evening, but lives close by.  Per daughter, plan if for Pt to d/c to her care, unless Pt isn't able to ambulate.  Notified RNCM  See yellow note in shadow chart.  Providence Crosby, LCSWA Clinical Social Work [phone]

## 2011-06-16 NOTE — Progress Notes (Signed)
[facility] progress note    Interval h/o:- 88 yr cf admit with COPD flare, usually gets this q4-6 weeks. BNP admit 500's, given lasix as well.  CXR admit showed mild CHF.  Not compliant with meds, therapies over 1st 24 hrs of care. Dementia pretty profound and cannot tell me recent events or tell date/time Received IV steoirds on admit-switched to PO Prednisone Moxifloxacin x 1 dose, switched to Azithromycin 12.12>>12/16  Subjective: Well.  Pleasant. Confused.  Nursing reports no issues today, but had a run of PVC's Flipped into Rate controlled Afib earlier today   Objective: Vital signs in last 24 hours: Temp:  [97.7 F (36.5 C)-99 F (37.2 C)] 97.7 F (36.5 C) (12/13 0555) Pulse Rate:  [77-92] 90  (12/13 0555) Resp:  [17-18] 18  (12/13 0555) BP: (97-131)/(45-64) 128/64 mmHg (12/13 1059) SpO2:  [93 %-99 %] 93 % (12/13 0907) Weight:  [68.9 kg (151 lb 14.4 oz)] 151 lb 14.4 oz (68.9 kg) (12/12 1609) Weight change:   Intake/Output Summary (Last 24 hours) at 06/16/11 1323 Last data filed at 06/16/11 0900  Gross per 24 hour  Intake    480 ml  Output      0 ml  Net    480 ml   General appearance: alert, cooperative and appears stated age  Throat: lips, mucosa, and tongue normal; teeth and gums normal  Lungs: wheezes bilaterally  Heart: irregularly irregular rhythm and Tele=Rate controlled Afib  Abdomen: soft, non-tender; bowel sounds normal; no masses, no organomegaly  Extremities: extremities normal, atraumatic, no cyanosis or edema  Lab Results:  [facility] 06/15/11 1052 06/14/11 1442  NA 141 143  K 4.1 3.2*  CL 105 105  CO2 29 --  GLUCOSE 101* 196*  BUN 15 10  CREATININE 0.75 0.90  CALCIUM 9.7 --  MG -- --  PHOS -- --    Basename 06/15/11 1052  AST 18  ALT 18  ALKPHOS 80  BILITOT 0.1*  PROT 6.2  ALBUMIN 3.3*   No results found for this basename: LIPASE:2,AMYLASE:2 in the last 72 hours  Basename 06/16/11 0505 06/15/11 1052 06/14/11 1429  WBC 8.8 7.3  --  NEUTROABS -- -- 6.7  HGB 11.7* 11.0* --  HCT 38.6 36.5 --  MCV 87.5 87.3 --  PLT 327 296 --    Basename 06/15/11 1050 06/15/11 0440 06/14/11 2103  CKTOTAL 47 34 29  CKMB 3.4 2.7 2.1  CKMBINDEX -- -- --  TROPONINI <0.30 <0.30 <0.30    Medications: I have reviewed the patient's current medications. Scheduled Meds:   . azithromycin  500 mg Oral Daily  . budesonide  0.5 mg Nebulization BID  . cholecalciferol  2,000 Units Oral Daily  . diltiazem  180 mg Oral Daily  . ferrous sulfate  325 mg Oral Daily  . furosemide  40 mg Oral Daily  . guaiFENesin  400 mg Oral BID  . loratadine  10 mg Oral Daily  . multivitamins ther. w/minerals  1 tablet Oral Daily  . pantoprazole  20 mg Oral Q1200  . predniSONE  60 mg Oral Q breakfast  . warfarin  6 mg Oral ONCE-1800  . DISCONTD: furosemide  40 mg Intravenous Daily  . DISCONTD: moxifloxacin  400 mg Oral q1800  . 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    LOS: 2 days   Masaye Gatchalian,JAI 06/16/2011, 1:23 PM

## 2011-06-16 NOTE — Progress Notes (Addendum)
ANTICOAGULATION CONSULT NOTE - Follow Up Consult  Pharmacy Consult for Coumadin  Indication: atrial fibrillation  Allergies  Allergen Reactions  . Amoxicillin Hives  . Penicillins Hives    Patient Measurements: Height: 5\' 2"  (157.5 cm) Weight: 151 lb 14.4 oz (68.9 kg) IBW/kg (Calculated) : 50.1   Vital Signs: Temp: 97.7 F (36.5 C) (12/13 0555) Temp src: Oral (12/13 0555) BP: 128/64 mmHg (12/13 1059) Pulse Rate: 90  (12/13 0555)  Labs:  Basename 06/16/11 0505 06/15/11 1052 06/15/11 1050 06/15/11 0440 06/14/11 2103 06/14/11 1442 06/14/11 1429  HGB 11.7* 11.0* -- -- -- -- --  HCT 38.6 36.5 -- -- -- 36.0 --  PLT 327 296 -- -- -- -- 261  APTT -- -- -- -- -- -- --  LABPROT 17.0* -- -- 16.5* -- -- 20.2*  INR 1.36 -- -- 1.31 -- -- 1.69*  HEPARINUNFRC -- -- -- -- -- -- --  CREATININE -- 0.75 -- -- -- 0.90 --  CKTOTAL -- -- 47 34 29 -- --  CKMB -- -- 3.4 2.7 2.1 -- --  TROPONINI -- -- <0.30 <0.30 <0.30 -- --   Estimated Creatinine Clearance: 45.9 ml/min (by C-G formula based on Cr of 0.75).   Medications:  Scheduled:    . azithromycin  500 mg Oral Daily  . budesonide  0.5 mg Nebulization BID  . cholecalciferol  2,000 Units Oral Daily  . diltiazem  180 mg Oral Daily  . ferrous sulfate  325 mg Oral Daily  . furosemide  40 mg Oral Daily  . guaiFENesin  400 mg Oral BID  . loratadine  10 mg Oral Daily  . multivitamins ther. w/minerals  1 tablet Oral Daily  . pantoprazole  20 mg Oral Q1200  . predniSONE  60 mg Oral Q breakfast  . DISCONTD: furosemide  40 mg Intravenous Daily  . DISCONTD: moxifloxacin  400 mg Oral q1800  . DISCONTD: simvastatin  10 mg Oral QHS   Infusions:    Assessment:  Warfarin 4 mg Tues, Thur, Sat and 6 mg on other days of the week PTA for h/o afib  INR MUST BE KEPT AT 2.0 OR PT WILL HAVE A GI BLEED PER FAMILY  Patient received 6 mg of coumadin yesterday and INR remains the same/subtherapeutic this AM.    Goal of Therapy:  INR 2-3   Plan:     Repeat Coumadin 6 mg po x 1 tonight  Be cautious with dosing to keep INR therapeutic but near 2  [place], Marcelo Ickes Thi 06/16/2011,1:12 PM

## 2011-06-17 LAB — PROTIME-INR
INR: 1.22 (ref 0.00–1.49)
Prothrombin Time: 15.7 s — ABNORMAL HIGH (ref 11.6–15.2)

## 2011-06-17 MED ORDER — WARFARIN SODIUM 6 MG PO TABS
6.0000 mg | ORAL_TABLET | Freq: Once | ORAL | Status: AC
  Administered 2011-06-17: 6 mg via ORAL
  Filled 2011-06-17: qty 1

## 2011-06-17 MED ORDER — IPRATROPIUM BROMIDE 0.02 % IN SOLN
0.5000 mg | RESPIRATORY_TRACT | Status: DC
  Administered 2011-06-17 – 2011-06-18 (×5): 0.5 mg via RESPIRATORY_TRACT
  Filled 2011-06-17 (×4): qty 2.5

## 2011-06-17 MED ORDER — ALBUTEROL SULFATE (5 MG/ML) 0.5% IN NEBU
2.5000 mg | INHALATION_SOLUTION | RESPIRATORY_TRACT | Status: DC
  Administered 2011-06-17 – 2011-06-18 (×5): 2.5 mg via RESPIRATORY_TRACT
  Filled 2011-06-17 (×5): qty 0.5

## 2011-06-17 MED ORDER — GUAIFENESIN ER 600 MG PO TB12
1200.0000 mg | ORAL_TABLET | Freq: Two times a day (BID) | ORAL | Status: DC
  Administered 2011-06-17 – 2011-06-18 (×2): 1200 mg via ORAL
  Filled 2011-06-17 (×5): qty 2

## 2011-06-17 MED ORDER — PREDNISONE 20 MG PO TABS
40.0000 mg | ORAL_TABLET | Freq: Every day | ORAL | Status: DC
  Administered 2011-06-18: 40 mg via ORAL
  Filled 2011-06-17 (×2): qty 2

## 2011-06-17 MED ORDER — PREDNISONE 20 MG PO TABS: 20.0000 mg | ORAL_TABLET | Freq: Every day | ORAL | Status: DC

## 2011-06-17 NOTE — Progress Notes (Signed)
ANTICOAGULATION CONSULT NOTE - Follow Up Consult  Pharmacy Consult for Coumadin  Indication: atrial fibrillation  Allergies  Allergen Reactions  . Amoxicillin Hives  . Penicillins Hives    Patient Measurements: Height: 5\' 2"  (157.5 cm) Weight: 151 lb 14.4 oz (68.9 kg) IBW/kg (Calculated) : 50.1   Vital Signs: Temp: 97.3 F (36.3 C) (12/14 0542) Temp src: Oral (12/14 0542) BP: 103/68 mmHg (12/14 0542) Pulse Rate: 72  (12/14 0542)  Labs:  Basename 06/17/11 0430 06/16/11 0505 06/15/11 1052 06/15/11 1050 06/15/11 0440 06/14/11 2103 06/14/11 1442 06/14/11 1429  HGB -- 11.7* 11.0* -- -- -- -- --  HCT -- 38.6 36.5 -- -- -- 36.0 --  PLT -- 327 296 -- -- -- -- 261  APTT -- -- -- -- -- -- -- --  LABPROT 15.7* 17.0* -- -- 16.5* -- -- --  INR 1.22 1.36 -- -- 1.31 -- -- --  HEPARINUNFRC -- -- -- -- -- -- -- --  CREATININE -- -- 0.75 -- -- -- 0.90 --  CKTOTAL -- -- -- 47 34 29 -- --  CKMB -- -- -- 3.4 2.7 2.1 -- --  TROPONINI -- -- -- <0.30 <0.30 <0.30 -- --   Estimated Creatinine Clearance: 45.9 ml/min (by C-G formula based on Cr of 0.75).   Medications:  Scheduled:     . azithromycin  500 mg Oral Daily  . budesonide  0.5 mg Nebulization BID  . diltiazem  180 mg Oral Daily  . ferrous sulfate  325 mg Oral Daily  . furosemide  40 mg Oral Daily  . guaiFENesin  400 mg Oral BID  . loratadine  10 mg Oral Daily  . multivitamins ther. w/minerals  1 tablet Oral Daily  . pantoprazole  20 mg Oral Q1200  . predniSONE  20 mg Oral Q breakfast  . warfarin  6 mg Oral ONCE-1800  . DISCONTD: cholecalciferol  2,000 Units Oral Daily  . DISCONTD: predniSONE  60 mg Oral Q breakfast   Infusions:    Assessment:  Warfarin 4 mg Tues, Thur, Sat and 6 mg on other days of the week PTA for h/o afib  INR MUST BE KEPT AT 2.0 OR PT WILL HAVE A GI BLEED PER FAMILY  INR has decreased today despite 6mg  dosing x 2 and drug interactions with prednisone and azithromycin (should increase INR).   Considering family's strong concern with INR>2, will continue to be conservative with dosing and will repeat 6mg  home dose today. If INR appears to be trending down tomorrow, will increase the dose slightly. No bleeding/complications reported.  Goal of Therapy:  INR 2-3   Plan:   Repeat Coumadin 6 mg po x 1 tonight. Follow up INR in AM.  Clance Boll 06/17/2011,7:24 AM

## 2011-06-17 NOTE — Progress Notes (Signed)
Marland Kitchen      [facility] progress note    Interval h/o:- 88 yr cf admit with COPD flare, usually gets this q4-6 weeks. BNP admit 500's, given lasix as well. CXR admit showed mild CHF.  Not compliant with meds, therapies over 1st 24 hrs of care.  Dementia pretty profound and cannot tell me recent events or tell date/time  Received IV steroids on admit-switched to PO Prednisone  Moxifloxacin x 1 dose, switched to Azithromycin 12.12>>12/16   Subjective: Well.  No issues really.  Still has wheeze.  Family in room.  No cp/n/v    Objective: Vital signs in last 24 hours: Temp:  [97.3 F (36.3 C)-98.2 F (36.8 C)] 97.9 F (36.6 C) (12/14 1404) Pulse Rate:  [72-82] 82  (12/14 1404) Resp:  [16-18] 18  (12/14 1404) BP: (103-110)/(63-78) 110/78 mmHg (12/14 1404) SpO2:  [94 %-96 %] 94 % (12/14 1404) Weight change:   Intake/Output Summary (Last 24 hours) at 06/17/11 1517 Last data filed at 06/17/11 1230  Gross per 24 hour  Intake    480 ml  Output      0 ml  Net    480 ml    BP 110/78  Pulse 82  Temp(Src) 97.9 F (36.6 C) (Oral)  Resp 18  Ht 5\' 2"  (1.575 m)  Wt 68.9 kg (151 lb 14.4 oz)  BMI 27.78 kg/m2  SpO2 94% General appearance: alert, cooperative and appears stated age  Throat: lips, mucosa, and tongue normal; teeth and gums normal  Lungs: wheezes bilaterally-no TVR, no TVF Heart: irregularly irregular rhythm and Tele=Rate controlled Afib  Abdomen: soft, non-tender; bowel sounds normal; no masses, no organomegaly  Extremities: extremities normal, atraumatic, no cyanosis or edema   Lab Results:  [facility] 06/15/11 1052  NA 141  K 4.1  CL 105  CO2 29  GLUCOSE 101*  BUN 15  CREATININE 0.75  CALCIUM 9.7  MG --  PHOS --    Basename 06/15/11 1052  AST 18  ALT 18  ALKPHOS 80  BILITOT 0.1*  PROT 6.2  ALBUMIN 3.3*   No results found for this basename: LIPASE:2,AMYLASE:2 in the last 72 hours  Basename 06/16/11 0505 06/15/11 1052  WBC 8.8 7.3  NEUTROABS -- --    HGB 11.7* 11.0*  HCT 38.6 36.5  MCV 87.5 87.3  PLT 327 296    Basename 06/15/11 1050 06/15/11 0440 06/14/11 2103  CKTOTAL 47 34 29  CKMB 3.4 2.7 2.1  CKMBINDEX -- -- --  TROPONINI <0.30 <0.30 <0.30   No components found with this basename: POCBNP:3 No results found for this basename: DDIMER:2 in the last 72 hours No results found for this basename: HGBA1C:2 in the last 72 hours No results found for this basename: CHOL:2,HDL:2,LDLCALC:2,TRIG:2,CHOLHDL:2,LDLDIRECT:2 in the last 72 hours No results found for this basename: TSH,T4TOTAL,FREET3,T3FREE,THYROIDAB in the last 72 hours No results found for this basename: VITAMINB12:2,FOLATE:2,FERRITIN:2,TIBC:2,IRON:2,RETICCTPCT:2 in the last 72 hours Micro Results: No results found for this or any previous visit (from the past 240 hour(s)).        Medications: I have reviewed the patient's current medications. Scheduled Meds:   . azithromycin  500 mg Oral Daily  . budesonide  0.5 mg Nebulization BID  . diltiazem  180 mg Oral Daily  . ferrous sulfate  325 mg Oral Daily  . furosemide  40 mg Oral Daily  . guaiFENesin  400 mg Oral BID  . loratadine  10 mg Oral Daily  . multivitamins ther. w/minerals  1  tablet Oral Daily  . pantoprazole  20 mg Oral Q1200  . predniSONE  20 mg Oral Q breakfast  . warfarin  6 mg Oral ONCE-1800  . warfarin  6 mg Oral ONCE-1800   Continuous Infusions:  PRN Meds:.albuterol, ipratropium   Assessment/Plan: Patient Active Hospital Problem List: COPD (06/14/2011) Assessment: Continue Nebs with Albuterol  Continue Steroids-implement taper 12.13  Azithro with stop 12/16  Attempt wean of O2-room air sats and reassess Add flutter valve+mucinex  A-fib, rate controlled on coumadin (05/10/2011) Assessment:Coumadin per Pharmacy + Cardizem 180 XRqd  INR subtherapeutic  CVA (cerebral vascular accident) (05/10/2011) Assessment: Coumadin-subtx INR  Monitor   CHF (congestive heart failure)  (06/14/2011) Assessment: cont lasix 40 qd.po   Dementia-stage 4-5 (06/16/2011) Assessment: significant-might benefit from overall GOC as outpt.     I have fully updated family as to the course of their family members care and have answered all questions that they had    If continues to do well, would d/c 1-2 days   LOS: 3 days   [facility] 06/17/2011, 3:17 PM

## 2011-06-18 MED ORDER — PREDNISONE 20 MG PO TABS: 40.0000 mg | ORAL_TABLET | Freq: Every day | ORAL | Status: AC

## 2011-06-18 MED ORDER — WARFARIN SODIUM 4 MG PO TABS
8.0000 mg | ORAL_TABLET | Freq: Once | ORAL | Status: DC
  Filled 2011-06-18: qty 2

## 2011-06-18 MED ORDER — PREDNISONE 20 MG PO TABS: 20.0000 mg | ORAL_TABLET | Freq: Every day | ORAL | Status: AC

## 2011-06-18 NOTE — Progress Notes (Signed)
ANTICOAGULATION CONSULT NOTE - Follow Up Consult  Pharmacy Consult for Coumadin  Indication: atrial fibrillation  Allergies  Allergen Reactions  . Amoxicillin Hives  . Penicillins Hives    Patient Measurements: Height: 5\' 2"  (157.5 cm) Weight: 151 lb 14.4 oz (68.9 kg) IBW/kg (Calculated) : 50.1   Vital Signs: Temp: 98.6 F (37 C) (12/14 2200) Temp src: Oral (12/14 2200) BP: 109/58 mmHg (12/14 2200) Pulse Rate: 88  (12/14 2200)  Labs:  Basename 06/18/11 0518 06/17/11 0430 06/16/11 0505 06/15/11 1052 06/15/11 1050  HGB -- -- 11.7* 11.0* --  HCT -- -- 38.6 36.5 --  PLT -- -- 327 296 --  APTT -- -- -- -- --  LABPROT 14.6 15.7* 17.0* -- --  INR 1.12 1.22 1.36 -- --  HEPARINUNFRC -- -- -- -- --  CREATININE -- -- -- 0.75 --  CKTOTAL -- -- -- -- 47  CKMB -- -- -- -- 3.4  TROPONINI -- -- -- -- <0.30   Estimated Creatinine Clearance: 45.9 ml/min (by C-G formula based on Cr of 0.75).   Medications:  Scheduled:     . albuterol  2.5 mg Nebulization Q4H  . azithromycin  500 mg Oral Daily  . budesonide  0.5 mg Nebulization BID  . diltiazem  180 mg Oral Daily  . ferrous sulfate  325 mg Oral Daily  . furosemide  40 mg Oral Daily  . guaiFENesin  1,200 mg Oral BID  . ipratropium  0.5 mg Nebulization Q4H  . loratadine  10 mg Oral Daily  . multivitamins ther. w/minerals  1 tablet Oral Daily  . pantoprazole  20 mg Oral Q1200  . predniSONE  20 mg Oral QAC breakfast  . predniSONE  40 mg Oral QAC breakfast  . warfarin  6 mg Oral ONCE-1800  . DISCONTD: guaiFENesin  400 mg Oral BID  . DISCONTD: predniSONE  20 mg Oral Q breakfast   Infusions:    Assessment:  Warfarin 4 mg Tues, Thur, Sat and 6 mg on other days of the week PTA for h/o afib  INR MUST BE KEPT AT 2.0 OR PT WILL HAVE A GI BLEED PER FAMILY  INR has decreased further today despite 6mg  dosing x 3 and drug interactions with prednisone and azithromycin (should increase INR).  Azithromycin d/c'd 12/14 No  bleeding/complications reported.  Goal of Therapy:  INR 2-3   Plan:   Coumadin 8 mg po x 1 tonight, will be conservative given family's concern for INR >2  Geoffry Paradise Thi 06/18/2011,8:37 AM

## 2011-06-18 NOTE — Discharge Summary (Signed)
Physician Discharge Summary  Patient ID: Angelica [name] MRN: [id number] DOB/AGE: [birth year] 75 y.o.  Admit date: 06/14/2011 Discharge date: 06/18/2011  Admission Diagnoses: COPD exacerbation and SOB  Discharge Diagnoses:  Principal Problem:  *COPD  Active Problems:  A-fib, rate controlled on coumadin  CVA (cerebral vascular accident)  CHF (congestive heart failure)  Dementia-stage 4-5   Discharged Condition: good  Hospital Course: 88 yr cf admit with COPD flare, usually gets this q4-6 weeks. BNP admit 500's, given lasix as well. CXR admit showed mild CHF. Not compliant with meds, therapies over 1st 24 hrs of care, dn refused Tele and Rx Dementia pretty profound and couldn't tell me recent events or tell date/time when she was initially admitted. Received IV steroids on admit-switched to PO Prednisone, for a slow taper.  Moxifloxacin x 1 dose, switched to Azithromycin 12.12>>12/15 Likely has a combination of COPD with CHF, and lasix apparently had been implemented over this past summer, which spaced out the intervals of time between she had these attacks of "COPD". SOB resolved over course the hospitalization and she ambulated without any need for O2  Consults: none  Significant Diagnostic Studies: labs: Mild anemia, and radiology: CXR: CHF on repeat as well  Treatments: antibiotics: azithromycin, anticoagulation: warfarin and procedures: nad  Discharge Exam: Blood pressure 104/60, pulse 86, temperature 97.9 F (36.6 C), temperature source Oral, resp. rate 20, height 5\' 2"  (1.575 m), weight 68.9 kg (151 lb 14.4 oz), SpO2 99.00%. General appearance: alert and cooperative General appearance: alert, cooperative and appears stated age  Throat: lips, mucosa, and tongue normal; teeth and gums normal  Lungs: wheezes bilaterally-no TVR, no TVF  Heart: irregularly irregular rhythm and Tele=Rate controlled Afib  Abdomen: soft, non-tender; bowel sounds normal; no masses, no  organomegaly  Extremities: extremities normal, atraumatic, no cyanosis or edema   Disposition:   Discharge Orders    Future Appointments: Provider: Department: Dept Phone: Center:   06/21/2011 10:45 AM Gwenith Spitz [facility] [phone] None   06/21/2011 11:00 AM [facility] [phone] None   07/11/2011 12:30 PM Levert Feinstein, MD [facility] [phone] None     Future Orders Please Complete By Expires   Diet - low sodium heart healthy      Increase activity slowly      Call MD for:  temperature >100.4      Call MD for:  redness, tenderness, or signs of infection (pain, swelling, redness, odor or green/yellow discharge around incision site)      Call MD for:  hives      Call MD for:  extreme fatigue      Call MD for:  severe uncontrolled pain        Current Discharge Medication List    START taking these medications   Details  predniSONE (DELTASONE) 20 MG tablet Take 2 tablets (40 mg total) by mouth daily before breakfast. Qty: 8 tablet, Refills: 0 Take from 12.16 until 12.20    predniSONE (DELTASONE) 20 MG tablet Take 1 tablet (20 mg total) by mouth daily before breakfast. Qty: 5 tablet, Refills: 0 Take from 12.20 until 12.25     - Potential duplicate medications found. Please discuss with provider.    CONTINUE these medications which have NOT CHANGED   Details  albuterol (PROVENTIL) (2.5 MG/3ML) 0.083% nebulizer solution Take 2.5 mg by nebulization 3 (three) times daily.      alendronate (FOSAMAX) 70 MG tablet Take 70 mg by mouth every 7 (seven) days. Take  with a full glass of water on an empty stomach.     budesonide (PULMICORT) 0.5 MG/2ML nebulizer solution Take 0.5 mg by nebulization 2 (two) times daily.      Cholecalciferol (VITAMIN D) 2000 UNITS CAPS Take 1 capsule by mouth daily.      diltiazem (DILACOR XR) 180 MG 24 hr capsule Take 180 mg by mouth daily.      ferrous sulfate 325 (65 FE) MG tablet Take 325 mg by  mouth daily.      fexofenadine (ALLEGRA) 180 MG tablet Take 180 mg by mouth daily.      furosemide (LASIX) 40 MG tablet Take 40 mg by mouth daily.      guaifenesin (HUMIBID E) 400 MG TABS Take 400 mg by mouth 2 (two) times daily.      Guaifenesin 1200 MG TB12 Take 1 tablet by mouth once.      lansoprazole (PREVACID) 30 MG capsule Take 30 mg by mouth daily.      Multiple Vitamins-Minerals (MULTIVITAMINS THER. W/MINERALS) TABS Take 1 tablet by mouth daily.      simvastatin (ZOCOR) 10 MG tablet Take 10 mg by mouth at bedtime.      tiotropium (SPIRIVA) 18 MCG inhalation capsule Place 18 mcg into inhaler and inhale daily.      warfarin (COUMADIN) 4 MG tablet Take 4-6 mg by mouth daily. Tuesday, Thursday, and Saturday=1 tablet Sunday, Monday, Wednesday, and Friday=1.5 tablets  (INR MUST BE KEPT AT 2 OR PATIENT WILL HAVE A GI BLEED PER FAMILY)       Follow-up Information    Follow up with Thayer Headings, MD. Make an appointment in 5 days.        Finish full course of steroids Needs an INRin 3-5 days Re-evaluate need for lasix daily-had multifactorial reasons for SOB   Signed: Hilton Saephan,JAI 06/18/2011, 4:23 PM

## 2011-06-21 ENCOUNTER — Telehealth: Payer: Self-pay | Admitting: Oncology

## 2011-06-21 ENCOUNTER — Other Ambulatory Visit: Payer: Medicare Other | Admitting: Lab

## 2011-06-21 ENCOUNTER — Ambulatory Visit: Payer: Medicare Other

## 2011-06-21 DIAGNOSIS — Z862 Personal history of diseases of the blood and blood-forming organs and certain disorders involving the immune mechanism: Secondary | ICD-10-CM

## 2011-06-21 NOTE — Telephone Encounter (Signed)
Called Angelica [name], pt's daughter to discuss missed appointment today and to reschedule.  Angelica [name] [facility] but elected to leave without getting labs when [place] learned that she was not, in fact, seeing Dr. Cyndie Chime today.  Of note, the patient was recently discharged from the hospital d/t a COPD exacerbation.  She was admitted on 06/14/11 and discharged 06/18/11.  She received IV steroids on admit and was switched to PO prednisone for a slow taper.  She received moxifloxacin x 1 dose, and was switched to azithromycin on 06/15/11 - 06/18/11. She missed 2 doses of Coumadin during the confusion of the admission process (she apparently stayed 24h in the ED while waiting for a hospital bed).  Per discharge instructions communicated to Angelica [name], her mother should take 8mg  on 12/15, then resume her usual dose of Coumadin: 6mg  daily except 4mg  on TuThSa.  She has not missed any doses since discharge.  I advised Angelica [name] that the discharge note from 06/18/11 indicates that she should have a repeat INR in 3-5 days, but Angelica [name] refuses to bring her mother back in sooner than 07/10/10 since her INR will be "off" from the steriods and antibiotics.  She wants her mother to come back for lab and [facility] Clinic on 07/10/10 with actual appointment with Dr. Cyndie Chime.  Communication sent to scheduling for the appointment times.  Labs added per daughter's request for 07/10/10 appt: CBC with diff, iron studies.

## 2011-06-29 ENCOUNTER — Inpatient Hospital Stay (HOSPITAL_COMMUNITY)
Admission: EM | Admit: 2011-06-29 | Discharge: 2011-07-02 | DRG: 195 | Disposition: A | Discharge status: Home / Self Care | Payer: Medicare Other | Attending: Internal Medicine | Admitting: Internal Medicine

## 2011-06-29 ENCOUNTER — Encounter (HOSPITAL_COMMUNITY): Payer: Self-pay

## 2011-06-29 ENCOUNTER — Emergency Department (HOSPITAL_COMMUNITY): Payer: Medicare Other

## 2011-06-29 DIAGNOSIS — I4891 Unspecified atrial fibrillation: Secondary | ICD-10-CM | POA: Diagnosis present

## 2011-06-29 DIAGNOSIS — E785 Hyperlipidemia, unspecified: Secondary | ICD-10-CM | POA: Diagnosis present

## 2011-06-29 DIAGNOSIS — I509 Heart failure, unspecified: Secondary | ICD-10-CM | POA: Diagnosis present

## 2011-06-29 DIAGNOSIS — F0390 Unspecified dementia without behavioral disturbance: Secondary | ICD-10-CM | POA: Diagnosis present

## 2011-06-29 DIAGNOSIS — J189 Pneumonia, unspecified organism: Principal | ICD-10-CM | POA: Diagnosis present

## 2011-06-29 DIAGNOSIS — I4821 Permanent atrial fibrillation: Secondary | ICD-10-CM | POA: Diagnosis present

## 2011-06-29 DIAGNOSIS — J4489 Other specified chronic obstructive pulmonary disease: Secondary | ICD-10-CM | POA: Diagnosis present

## 2011-06-29 DIAGNOSIS — J449 Chronic obstructive pulmonary disease, unspecified: Secondary | ICD-10-CM | POA: Diagnosis present

## 2011-06-29 LAB — URINALYSIS, ROUTINE W REFLEX MICROSCOPIC
Bilirubin Urine: NEGATIVE
Ketones, ur: NEGATIVE mg/dL
Leukocytes, UA: NEGATIVE
Nitrite: NEGATIVE
Specific Gravity, Urine: 1.015 (ref 1.005–1.030)
Urobilinogen, UA: 1 mg/dL (ref 0.0–1.0)
pH: 8 (ref 5.0–8.0)

## 2011-06-29 LAB — COMPREHENSIVE METABOLIC PANEL
Albumin: 2.9 g/dL — ABNORMAL LOW (ref 3.5–5.2)
Alkaline Phosphatase: 68 U/L (ref 39–117)
BUN: 13 mg/dL (ref 6–23)
CO2: 29 mEq/L (ref 19–32)
Chloride: 104 mEq/L (ref 96–112)
Creatinine, Ser: 0.82 mg/dL (ref 0.50–1.10)
GFR calc non Af Amer: 63 mL/min — ABNORMAL LOW (ref >90)
Potassium: 3.6 mEq/L (ref 3.5–5.1)
Total Bilirubin: 0.3 mg/dL (ref 0.3–1.2)

## 2011-06-29 LAB — CBC
MCH: 26.9 pg (ref 26.0–34.0)
MCV: 87.3 fL (ref 78.0–100.0)
Platelets: 429 10*3/uL — ABNORMAL HIGH (ref 150–400)
RBC: 3.87 MIL/uL (ref 3.87–5.11)
RDW: 17.3 % — ABNORMAL HIGH (ref 11.5–15.5)
WBC: 15.7 10*3/uL — ABNORMAL HIGH (ref 4.0–10.5)

## 2011-06-29 LAB — PROTIME-INR: Prothrombin Time: 17 seconds — ABNORMAL HIGH (ref 11.6–15.2)

## 2011-06-29 MED ORDER — DILTIAZEM HCL ER 180 MG PO CP24
180.0000 mg | ORAL_CAPSULE | Freq: Every day | ORAL | Status: DC
  Administered 2011-06-29 – 2011-07-02 (×3): 180 mg via ORAL
  Filled 2011-06-29 (×4): qty 1

## 2011-06-29 MED ORDER — LORATADINE 10 MG PO TABS
10.0000 mg | ORAL_TABLET | Freq: Every day | ORAL | Status: DC
  Administered 2011-06-29 – 2011-07-02 (×4): 10 mg via ORAL
  Filled 2011-06-29 (×4): qty 1

## 2011-06-29 MED ORDER — ALBUTEROL SULFATE (5 MG/ML) 0.5% IN NEBU
2.5000 mg | INHALATION_SOLUTION | Freq: Three times a day (TID) | RESPIRATORY_TRACT | Status: DC
  Administered 2011-06-29 – 2011-07-02 (×8): 2.5 mg via RESPIRATORY_TRACT
  Filled 2011-06-29 (×7): qty 0.5

## 2011-06-29 MED ORDER — WARFARIN SODIUM 7.5 MG PO TABS
7.5000 mg | ORAL_TABLET | Freq: Once | ORAL | Status: AC
  Administered 2011-06-29: 7.5 mg via ORAL
  Filled 2011-06-29: qty 1

## 2011-06-29 MED ORDER — GUAIFENESIN ER 600 MG PO TB12
1200.0000 mg | ORAL_TABLET | Freq: Once | ORAL | Status: AC
  Administered 2011-06-29: 1200 mg via ORAL
  Filled 2011-06-29: qty 2

## 2011-06-29 MED ORDER — TIOTROPIUM BROMIDE MONOHYDRATE 18 MCG IN CAPS
18.0000 ug | ORAL_CAPSULE | Freq: Every day | RESPIRATORY_TRACT | Status: DC
  Administered 2011-06-29 – 2011-07-02 (×4): 18 ug via RESPIRATORY_TRACT
  Filled 2011-06-29: qty 5

## 2011-06-29 MED ORDER — SODIUM CHLORIDE 0.9 % IV SOLN
INTRAVENOUS | Status: DC
  Administered 2011-06-29: 200 mL/h via INTRAVENOUS
  Administered 2011-06-29: 20 mL/h via INTRAVENOUS

## 2011-06-29 MED ORDER — VANCOMYCIN HCL IN DEXTROSE 1-5 GM/200ML-% IV SOLN
1000.0000 mg | Freq: Once | INTRAVENOUS | Status: AC
  Administered 2011-06-29: 1000 mg via INTRAVENOUS
  Filled 2011-06-29: qty 200

## 2011-06-29 MED ORDER — ALBUTEROL SULFATE (5 MG/ML) 0.5% IN NEBU
5.0000 mg | INHALATION_SOLUTION | Freq: Once | RESPIRATORY_TRACT | Status: AC
  Administered 2011-06-29: 5 mg via RESPIRATORY_TRACT
  Filled 2011-06-29: qty 1

## 2011-06-29 MED ORDER — POTASSIUM CHLORIDE IN NACL 20-0.9 MEQ/L-% IV SOLN
INTRAVENOUS | Status: DC
  Administered 2011-06-29 – 2011-06-30 (×2): via INTRAVENOUS
  Filled 2011-06-29 (×5): qty 1000

## 2011-06-29 MED ORDER — MOXIFLOXACIN HCL IN NACL 400 MG/250ML IV SOLN
400.0000 mg | Freq: Once | INTRAVENOUS | Status: AC
  Administered 2011-06-29: 400 mg via INTRAVENOUS
  Filled 2011-06-29: qty 250

## 2011-06-29 MED ORDER — SIMVASTATIN 10 MG PO TABS
10.0000 mg | ORAL_TABLET | Freq: Every day | ORAL | Status: DC
  Administered 2011-06-29 – 2011-07-01 (×3): 10 mg via ORAL
  Filled 2011-06-29 (×4): qty 1

## 2011-06-29 MED ORDER — DEXTROSE 5 % IV SOLN
2.0000 g | Freq: Two times a day (BID) | INTRAVENOUS | Status: DC
  Administered 2011-06-29 – 2011-07-01 (×5): 2 g via INTRAVENOUS
  Filled 2011-06-29 (×8): qty 2

## 2011-06-29 MED ORDER — SODIUM CHLORIDE 0.9 % IV BOLUS (SEPSIS)
500.0000 mL | Freq: Once | INTRAVENOUS | Status: AC
  Administered 2011-06-29: 500 mL via INTRAVENOUS

## 2011-06-29 MED ORDER — BUDESONIDE 0.5 MG/2ML IN SUSP
0.5000 mg | Freq: Two times a day (BID) | RESPIRATORY_TRACT | Status: DC
  Administered 2011-06-29 – 2011-07-02 (×6): 0.5 mg via RESPIRATORY_TRACT
  Filled 2011-06-29 (×7): qty 2

## 2011-06-29 MED ORDER — FUROSEMIDE 40 MG PO TABS
40.0000 mg | ORAL_TABLET | Freq: Every day | ORAL | Status: DC
  Administered 2011-06-29 – 2011-07-02 (×4): 40 mg via ORAL
  Filled 2011-06-29 (×4): qty 1

## 2011-06-29 MED ORDER — VANCOMYCIN HCL 1000 MG IV SOLR
750.0000 mg | Freq: Two times a day (BID) | INTRAVENOUS | Status: DC
  Administered 2011-06-30 – 2011-07-02 (×5): 750 mg via INTRAVENOUS
  Filled 2011-06-29 (×7): qty 750

## 2011-06-29 MED ORDER — PANTOPRAZOLE SODIUM 20 MG PO TBEC
20.0000 mg | DELAYED_RELEASE_TABLET | Freq: Every day | ORAL | Status: DC
  Administered 2011-06-29 – 2011-07-02 (×4): 20 mg via ORAL
  Filled 2011-06-29 (×4): qty 1

## 2011-06-29 NOTE — ED Notes (Signed)
WUJ:[id number]<JY> Expected date:06/29/11<BR> Expected time:12:34 PM<BR> Means of arrival:Ambulance<BR> Comments:<BR> EMS 41 GC, 89 yof Low SPO2

## 2011-06-29 NOTE — ED Notes (Signed)
[facility].... Daughter states pt was confused this morning, she checked her O2 sats and it was 86%.  Upon arrival pt was put on 4 liters O2.  Was initially confused, but became oriented after O2 was applied.  B/P 98/50, HR 76 irregular---hx of Afib.  100% O2 sats on 4 liters.  No c/o pain.  CBG 89.  Attempted IV in left hand---unsuccessful.  She was able to stand and pivot to the stretcher.  EMS thought she may be orthostatic since her B/P lowered and HR went up after that.  Daughter is POA.

## 2011-06-29 NOTE — ED Notes (Signed)
Family at bedside. Pt alert, denies c/o pain. IV intact R antecub.

## 2011-06-29 NOTE — H&P (Addendum)
PCP:   Thayer Headings, MD, MD   Chief Complaint:  Cough, fever  HPI: [name] who has a history of COPD and, stroke, CHF, H. fibrillation who presents emerge department with cough and fever that started earlier today. The patient had a fever of 100.5 with productive cough. The family she did have smoking mental status worse than her baseline dementia, which has resolved. She was recently hospitalized with COPD exacerbation in CHF on 12/11. She just finished her steroid taper. She denies nausea, vomiting, sick contacts, anorexia, hematochezia. She did receive her flu shot this year.   Review of Systems:  The patient denies anorexia, fever, weight loss,, vision loss, decreased hearing, hoarseness, chest pain, syncope, dyspnea on exertion, peripheral edema, balance deficits, hemoptysis, abdominal pain, melena, hematochezia, severe indigestion/heartburn, hematuria, incontinence, genital sores, muscle weakness, suspicious skin lesions, transient blindness, difficulty walking, depression, unusual weight change, abnormal bleeding, enlarged lymph nodes, angioedema, and breast masses.  Past Medical History: Past Medical History  Diagnosis Date  . Atrial fibrillation   . Stroke   . Hypertension   . Arthritis   . GERD (gastroesophageal reflux disease)   . COPD (chronic obstructive pulmonary disease)   . PNA (pneumonia)    Past Surgical History  Procedure Date  . Hip fracture surgery   cholecystectomy, dupuytren correction, knee scope, cataracts removal - right  Medications: Prior to Admission medications   Medication Sig Start Date End Date Taking? Authorizing Provider  albuterol (PROVENTIL) (2.5 MG/3ML) 0.083% nebulizer solution Take 2.5 mg by nebulization 3 (three) times daily.     Yes Historical Provider, MD  alendronate (FOSAMAX) 70 MG tablet Take 70 mg by mouth every 7 (seven) days. Take with a full glass of water on an empty stomach. On Tuesday   Yes  Historical Provider, MD  budesonide (PULMICORT) 0.5 MG/2ML nebulizer solution Take 0.5 mg by nebulization 2 (two) times daily.     Yes Historical Provider, MD  Cholecalciferol (VITAMIN D) 2000 UNITS CAPS Take 1 capsule by mouth daily.     Yes Historical Provider, MD  diltiazem (DILACOR XR) 180 MG 24 hr capsule Take 180 mg by mouth daily.     Yes Historical Provider, MD  ferrous sulfate 325 (65 FE) MG tablet Take 325 mg by mouth daily.     Yes Historical Provider, MD  fexofenadine (ALLEGRA) 180 MG tablet Take 180 mg by mouth daily.     Yes Historical Provider, MD  furosemide (LASIX) 40 MG tablet Take 40 mg by mouth daily.     Yes Historical Provider, MD  Guaifenesin 1200 MG TB12 Take 1 tablet by mouth once.     Yes Historical Provider, MD  lansoprazole (PREVACID) 30 MG capsule Take 30 mg by mouth daily.     Yes Historical Provider, MD  Multiple Vitamins-Minerals (MULTIVITAMINS THER. W/MINERALS) TABS Take 1 tablet by mouth daily.     Yes Historical Provider, MD  simvastatin (ZOCOR) 10 MG tablet Take 10 mg by mouth at bedtime.     Yes Historical Provider, MD  tiotropium (SPIRIVA) 18 MCG inhalation capsule Place 18 mcg into inhaler and inhale daily.     Yes Historical Provider, MD  warfarin (COUMADIN) 4 MG tablet Take 4-6 mg by mouth daily. Tuesday, Thursday, and Saturday=1 tablet Sunday, Monday, Wednesday, and Friday=1.5 tablets  (INR MUST BE KEPT AT 2 OR PATIENT WILL HAVE A GI BLEED PER FAMILY)   Yes Historical Provider, MD  predniSONE (DELTASONE) 20 MG tablet Take 1 tablet (  20 mg total) by mouth daily before breakfast. 06/24/11 06/28/11  Pleas Koch, MD    Allergies:   Allergies  Allergen Reactions  . Amoxicillin Hives  . Penicillins Hives    Social History:  reports that she has never smoked. She has never used smokeless tobacco. She reports that she drinks about 4.2 ounces of alcohol per week. She reports that she does not use illicit drugs.  Family History: History reviewed. No  pertinent family history.  Physical Exam: Filed Vitals:   06/29/11 1401 06/29/11 1458 06/29/11 1516 06/29/11 1545  BP: 101/68 89/40 82/39  127/96  Pulse: 78 70 75 68  Temp: 97.2 F (36.2 C) 98.9 F (37.2 C)    TempSrc: Oral     Resp:   22 21  SpO2: 100% 96% 96% 99%   General appearance: alert, cooperative and no distress Head: Normocephalic, without obvious abnormality, atraumatic Eyes: conjunctivae/corneas clear. PERRL, EOM's intact. Fundi benign. Nose: Nares normal. Septum midline. Mucosa normal. No drainage or sinus tenderness. Neck: no adenopathy, no carotid bruit, no JVD, supple, symmetrical, trachea midline and thyroid not enlarged, symmetric, no tenderness/mass/nodules Resp: rales LLL and Egophony Cardio: irregularly irregular rhythm GI: soft, non-tender; bowel sounds normal; no masses,  no organomegaly Extremities: extremities normal, atraumatic, no cyanosis or edema Pulses: 2+ and symmetric Skin: Skin color, texture, turgor normal. No rashes or lesions   Labs on Admission:   Basename 06/29/11 1340  NA 140  K 3.6  CL 104  CO2 29  GLUCOSE 119*  BUN 13  CREATININE 0.82  CALCIUM 9.2  MG --  PHOS --    Basename 06/29/11 1340  AST 17  ALT 23  ALKPHOS 68  BILITOT 0.3  PROT 5.3*  ALBUMIN 2.9*   Lab Results  Component Value Date   WBC 15.7* 06/29/2011   HGB 10.4* 06/29/2011   HCT 33.8* 06/29/2011   MCV 87.3 06/29/2011   PLT 429* 06/29/2011    Radiological Exams on Admission: Dg Chest 2 View  06/29/2011  *RADIOLOGY REPORT*  Clinical Data: Fever, cough, shortness of breath, history hypertension  CHEST - 2 VIEW  Comparison: 06/15/2011  Findings: Enlargement of cardiac silhouette. Calcified tortuous aorta. Pulmonary vascularity normal. Small hiatal hernia. Patchy left mid lung infiltrate consistent with pneumonia. Underlying emphysematous and bronchitic changes. No pleural effusion or pneumothorax. Biapical pleural thickening and scarring unchanged. Bones  diffusely demineralized.  IMPRESSION: Left mid lung infiltrates consistent with pneumonia. Underlying emphysematous and chronic bronchitic changes. Enlargement of cardiac silhouette.  Original Report Authenticated By: Lollie Marrow, M.D.   Dg Chest 2 View  06/15/2011  *RADIOLOGY REPORT*  Clinical Data:  Rule out pneumonia.  CHEST - 2 VIEW  Comparison: 06/14/2011  Findings: Heart size appears mildly enlarged.  Interval improvement and pleural effusions and pulmonary venous congestion.  Coarsened interstitial markings are noted bilaterally, similar to previous exam.  Moderate sized hiatal hernia is present.  IMPRESSION:  1.  Improvement in CHF pattern.  Original Report Authenticated By: Rosealee Albee, M.D.   Dg Chest 2 View  06/14/2011  *RADIOLOGY REPORT*  Clinical Data: Short of breath  CHEST - 2 VIEW  Comparison: December 04, 2010  Findings: Cardiomegaly.  Mild diffuse edema. No pneumothorax.  No pleural effusion.  Bibasilar atelectasis and low volumes.  IMPRESSION: Mild CHF.  Original Report Authenticated By: Donavan Burnet, M.D.    Assessment/Plan #1 left middle air space pneumonia #2 COPD and #3 dementia #4 atrial fibrillation #5 history of CVA  Admit the patient.  Blood cultures were obtained in the emergency room prior to starting antibiotics.  Continue with empiric antibiotics- 2 to reason physician will cover for multiresistant bacteria.  The patient is penicillin allergic with skin reaction, will cover with vancomycin and cefepime. The skin as needed to maintain saturations greater than 90%. Continue with IV fluids. Coumadin for DVT prophylaxis. Patient is a full code.   STINSON, JACOB JEHIEL 06/29/2011, 4:28 PM

## 2011-06-29 NOTE — Progress Notes (Signed)
ANTICOAGULATION CONSULT NOTE - Initial Consult  Pharmacy Consult for warfarin Indication: atrial fibrillation  Allergies  Allergen Reactions  . Amoxicillin Hives  . Penicillins Hives    Patient Measurements: Height: 5\' 5"  (165.1 cm) Weight: 157 lb 9.6 oz (71.487 kg) IBW/kg (Calculated) : 57   Vital Signs: Temp: 99.3 F (37.4 C) (12/26 1712) Temp src: Oral (12/26 1712) BP: 97/62 mmHg (12/26 1712) Pulse Rate: 75  (12/26 1712)  Labs:  Basename 06/29/11 1740 06/29/11 1340  HGB -- 10.4*  HCT -- 33.8*  PLT -- 429*  APTT -- --  LABPROT 17.0* --  INR 1.36 --  HEPARINUNFRC -- --  CREATININE -- 0.82  CKTOTAL -- --  CKMB -- --  TROPONINI -- --   Estimated Creatinine Clearance: 48.8 ml/min (by C-G formula based on Cr of 0.82).  Medical History: Past Medical History  Diagnosis Date  . Atrial fibrillation   . Stroke   . Hypertension   . Arthritis   . GERD (gastroesophageal reflux disease)   . COPD (chronic obstructive pulmonary disease)   . PNA (pneumonia)     Medications:  Scheduled:    . albuterol  2.5 mg Nebulization TID  . albuterol  5 mg Nebulization Once  . budesonide  0.5 mg Nebulization BID  . ceFEPime (MAXIPIME) IV  2 g Intravenous Q12H  . diltiazem  180 mg Oral Daily  . furosemide  40 mg Oral Daily  . guaiFENesin  1,200 mg Oral Once  . loratadine  10 mg Oral Daily  . moxifloxacin  400 mg Intravenous Once  . pantoprazole  20 mg Oral Q1200  . simvastatin  10 mg Oral QHS  . sodium chloride  500 mL Intravenous Once  . tiotropium  18 mcg Inhalation Daily  . vancomycin  750 mg Intravenous Q12H  . vancomycin  1,000 mg Intravenous Once   Infusions:    . sodium chloride 20 mL/hr (06/29/11 1640)  . 0.9 % NaCl with KCl 20 mEq / L 75 mL/hr at 06/29/11 1817    Assessment: 77 yo admitted with HAP to continue her warfarin she was taking PTA for hx Afib. Patient's home dose PTA was 4mg  daily except 6mg  Mon/Wed/Fri  Goal of Therapy:  INR 2-3   Plan:  INR  subtherapeutic. Will give boost dose of 7.5mg  tonight. Daily PT/INR   Hessie Knows, PharmD, BCPS 06/29/2011 6:23 PM [phone]

## 2011-06-29 NOTE — ED Provider Notes (Signed)
History     CSN: [id number]  Arrival date & time 06/29/11  1240   First MD Initiated Contact with Patient 06/29/11 1247      Chief Complaint  Patient presents with  . Shortness of Breath    (Consider location/radiation/quality/duration/timing/severity/associated sxs/prior treatment) Patient is a 77 y.o. female presenting with shortness of breath. The history is provided by the patient.  Shortness of Breath  Associated symptoms include a fever and shortness of breath. Pertinent negatives include no chest pain and no sore throat.  pt w hx copd, family saw yesterday, seemed at baseline. Today called pt and was still in bed at 10 am. Mild cough. Also noted 'fever', temp 100.1 at home.  Baseline ms is periods of confusion, hx dementia, although seemed a bit more confused this am, currently ms at baseline. No sore throat, nasal congestion or other upper respiratory symptoms. No headache or body aches. No abd pain. No nvd. No gu c/o. Pt states mild sob at baseline, denies any change. No chest pain.   Past Medical History  Diagnosis Date  . Atrial fibrillation   . Stroke   . Hypertension   . Arthritis   . GERD (gastroesophageal reflux disease)   . COPD (chronic obstructive pulmonary disease)   . PNA (pneumonia)     Past Surgical History  Procedure Date  . Hip fracture surgery     No family history on file.  History  Substance Use Topics  . Smoking status: Never Smoker   . Smokeless tobacco: Never Used  . Alcohol Use: 4.2 oz/week    7 Glasses of wine per week    OB History    Grav Para Term Preterm Abortions TAB SAB Ect Mult Living                  Review of Systems  Constitutional: Positive for fever. Negative for chills.  HENT: Negative for sore throat, neck pain and neck stiffness.   Eyes: Negative for redness.  Respiratory: Positive for shortness of breath.   Cardiovascular: Negative for chest pain.  Gastrointestinal: Negative for vomiting, abdominal pain and  diarrhea.  Genitourinary: Negative for dysuria and flank pain.  Musculoskeletal: Negative for back pain.  Skin: Negative for rash.  Neurological: Negative for headaches.  Hematological: Does not bruise/bleed easily.  Psychiatric/Behavioral: Positive for confusion.    Allergies  Amoxicillin and Penicillins  Home Medications   Current Outpatient Rx  Name Route Sig Dispense Refill  . ALBUTEROL SULFATE (2.5 MG/3ML) 0.083% IN NEBU Nebulization Take 2.5 mg by nebulization 3 (three) times daily.      . ALENDRONATE SODIUM 70 MG PO TABS Oral Take 70 mg by mouth every 7 (seven) days. Take with a full glass of water on an empty stomach.     . BUDESONIDE 0.5 MG/2ML IN SUSP Nebulization Take 0.5 mg by nebulization 2 (two) times daily.      Marland Kitchen VITAMIN D 2000 UNITS PO CAPS Oral Take 1 capsule by mouth daily.      Marland Kitchen DILTIAZEM HCL ER 180 MG PO CP24 Oral Take 180 mg by mouth daily.      Marland Kitchen FERROUS SULFATE 325 (65 FE) MG PO TABS Oral Take 325 mg by mouth daily.      Marland Kitchen FEXOFENADINE HCL 180 MG PO TABS Oral Take 180 mg by mouth daily.      . FUROSEMIDE 40 MG PO TABS Oral Take 40 mg by mouth daily.      . GUAIFENESIN 400  MG PO TABS Oral Take 400 mg by mouth 2 (two) times daily.      . GUAIFENESIN ER 1200 MG PO TB12 Oral Take 1 tablet by mouth once.      Marland Kitchen LANSOPRAZOLE 30 MG PO CPDR Oral Take 30 mg by mouth daily.      Carma Leaven M PLUS PO TABS Oral Take 1 tablet by mouth daily.      Marland Kitchen PREDNISONE 20 MG PO TABS Oral Take 1 tablet (20 mg total) by mouth daily before breakfast. 5 tablet 0  . SIMVASTATIN 10 MG PO TABS Oral Take 10 mg by mouth at bedtime.      Marland Kitchen TIOTROPIUM BROMIDE MONOHYDRATE 18 MCG IN CAPS Inhalation Place 18 mcg into inhaler and inhale daily.      . WARFARIN SODIUM 4 MG PO TABS Oral Take 4-6 mg by mouth daily. Tuesday, Thursday, and Saturday=1 tablet Sunday, Monday, Wednesday, and Friday=1.5 tablets  (INR MUST BE KEPT AT 2 OR PATIENT WILL HAVE A GI BLEED PER FAMILY)      There were no vitals  taken for this visit.  Physical Exam  Nursing note and vitals reviewed. Constitutional: She appears well-developed and well-nourished. No distress.  HENT:  Head: Atraumatic.  Eyes: Conjunctivae are normal. Pupils are equal, round, and reactive to light. No scleral icterus.  Neck: Normal range of motion. Neck supple. No tracheal deviation present.       No stiffness or rigidity  Cardiovascular: Normal rate, normal heart sounds and intact distal pulses.   Pulmonary/Chest: Effort normal. No respiratory distress. She has wheezes.       Mild upper resp congestion  Abdominal: Soft. Normal appearance and bowel sounds are normal. She exhibits no distension and no mass. There is no tenderness. There is no rebound and no guarding.  Genitourinary:       No cva tenderness  Musculoskeletal: She exhibits no edema and no tenderness.  Neurological: She is alert.       Alert and awake, responds to questions appropriately. Ms c/w baseline per family. Motor intact bil   Skin: Skin is warm and dry. No rash noted.  Psychiatric: She has a normal mood and affect.    ED Course  Procedures (including critical care time)   Labs Reviewed  BLOOD GAS, ARTERIAL  COMPREHENSIVE METABOLIC PANEL  CBC  URINALYSIS, ROUTINE W REFLEX MICROSCOPIC  PROTIME-INR    Results for orders placed during the hospital encounter of 06/29/11  COMPREHENSIVE METABOLIC PANEL      Component Value Range   Sodium 140  135 - 145 (mEq/L)   Potassium 3.6  3.5 - 5.1 (mEq/L)   Chloride 104  96 - 112 (mEq/L)   CO2 29  19 - 32 (mEq/L)   Glucose, Bld 119 (*) 70 - 99 (mg/dL)   BUN 13  6 - 23 (mg/dL)   Creatinine, Ser 1.61  0.50 - 1.10 (mg/dL)   Calcium 9.2  8.4 - 09.6 (mg/dL)   Total Protein 5.3 (*) 6.0 - 8.3 (g/dL)   Albumin 2.9 (*) 3.5 - 5.2 (g/dL)   AST 17  0 - 37 (U/L)   ALT 23  0 - 35 (U/L)   Alkaline Phosphatase 68  39 - 117 (U/L)   Total Bilirubin 0.3  0.3 - 1.2 (mg/dL)   GFR calc non Af Amer 63 (*) >90 (mL/min)   GFR calc  Af Amer 73 (*) >90 (mL/min)  CBC      Component Value Range  WBC 15.7 (*) 4.0 - 10.5 (K/uL)   RBC 3.87  3.87 - 5.11 (MIL/uL)   Hemoglobin 10.4 (*) 12.0 - 15.0 (g/dL)   HCT 16.1 (*) 09.6 - 46.0 (%)   MCV 87.3  78.0 - 100.0 (fL)   MCH 26.9  26.0 - 34.0 (pg)   MCHC 30.8  30.0 - 36.0 (g/dL)   RDW 04.5 (*) 40.9 - 15.5 (%)   Platelets 429 (*) 150 - 400 (K/uL)  URINALYSIS, ROUTINE W REFLEX MICROSCOPIC      Component Value Range   Color, Urine YELLOW  YELLOW    APPearance CLOUDY (*) CLEAR    Specific Gravity, Urine 1.015  1.005 - 1.030    pH 8.0  5.0 - 8.0    Glucose, UA NEGATIVE  NEGATIVE (mg/dL)   Hgb urine dipstick NEGATIVE  NEGATIVE    Bilirubin Urine NEGATIVE  NEGATIVE    Ketones, ur NEGATIVE  NEGATIVE (mg/dL)   Protein, ur NEGATIVE  NEGATIVE (mg/dL)   Urobilinogen, UA 1.0  0.0 - 1.0 (mg/dL)   Nitrite NEGATIVE  NEGATIVE    Leukocytes, UA NEGATIVE  NEGATIVE   LACTIC ACID, PLASMA      Component Value Range   Lactic Acid, Venous 1.5  0.5 - 2.2 (mmol/L)   Dg Chest 2 View  06/29/2011  *RADIOLOGY REPORT*  Clinical Data: Fever, cough, shortness of breath, history hypertension  CHEST - 2 VIEW  Comparison: 06/15/2011  Findings: Enlargement of cardiac silhouette. Calcified tortuous aorta. Pulmonary vascularity normal. Small hiatal hernia. Patchy left mid lung infiltrate consistent with pneumonia. Underlying emphysematous and bronchitic changes. No pleural effusion or pneumothorax. Biapical pleural thickening and scarring unchanged. Bones diffusely demineralized.  IMPRESSION: Left mid lung infiltrates consistent with pneumonia. Underlying emphysematous and chronic bronchitic changes. Enlargement of cardiac silhouette.  Original Report Authenticated By: Lollie Marrow, M.D.   Dg Chest 2 View  06/15/2011  *RADIOLOGY REPORT*  Clinical Data:  Rule out pneumonia.  CHEST - 2 VIEW  Comparison: 06/14/2011  Findings: Heart size appears mildly enlarged.  Interval improvement and pleural effusions and  pulmonary venous congestion.  Coarsened interstitial markings are noted bilaterally, similar to previous exam.  Moderate sized hiatal hernia is present.  IMPRESSION:  1.  Improvement in CHF pattern.  Original Report Authenticated By: Rosealee Albee, M.D.   Dg Chest 2 View  06/14/2011  *RADIOLOGY REPORT*  Clinical Data: Short of breath  CHEST - 2 VIEW  Comparison: December 04, 2010  Findings: Cardiomegaly.  Mild diffuse edema. No pneumothorax.  No pleural effusion.  Bibasilar atelectasis and low volumes.  IMPRESSION: Mild CHF.  Original Report Authenticated By: Donavan Burnet, M.D.      MDM  Labs sent. Cxr. Albuterol neb treatment given. Reviewed nursing notes and prior labs.   Given pna, borderline bp, etc, triad called to admit. They indicate do bed request for team 5, [place], monitored bed.   Recheck pt, no increased wob. Remains conscious and alert, conversant, ms c/w baseline. No c/o of pain. No headache. No cp.   Given recent hospitalization, pna, will rx for hcap, avelox and vanc iv (due to pcn allergy).     Suzi Roots, MD 06/29/11 [phone]

## 2011-06-29 NOTE — Progress Notes (Signed)
ANTIBIOTIC CONSULT NOTE - INITIAL  Pharmacy Consult for Vancomycin Indication: pneumonia  Allergies  Allergen Reactions  . Amoxicillin Hives  . Penicillins Hives    Patient Measurements: Height: 5\' 5"  (165.1 cm) Weight: 157 lb 9.6 oz (71.487 kg) IBW/kg (Calculated) : 57   Vital Signs: Temp: 99.3 F (37.4 C) (12/26 1712) Temp src: Oral (12/26 1712) BP: 97/62 mmHg (12/26 1712) Pulse Rate: 75  (12/26 1712) Intake/Output from previous day:   Intake/Output from this shift: Total I/O In: 1000 [Other:1000] Out: 450 [Urine:450]  Labs:  Basename 06/29/11 1340  WBC 15.7*  HGB 10.4*  PLT 429*  LABCREA --  CREATININE 0.82   Estimated Creatinine Clearance: 48.8 ml/min (by C-G formula based on Cr of 0.82). No results found for this basename: VANCOTROUGH:2,VANCOPEAK:2,VANCORANDOM:2,GENTTROUGH:2,GENTPEAK:2,GENTRANDOM:2,TOBRATROUGH:2,TOBRAPEAK:2,TOBRARND:2,AMIKACINPEAK:2,AMIKACINTROU:2,AMIKACIN:2, in the last 72 hours   Microbiology: No results found for this or any previous visit (from the past 720 hour(s)).  Medical History: Past Medical History  Diagnosis Date  . Atrial fibrillation   . Stroke   . Hypertension   . Arthritis   . GERD (gastroesophageal reflux disease)   . COPD (chronic obstructive pulmonary disease)   . PNA (pneumonia)     Medications:  Scheduled:    . albuterol  2.5 mg Nebulization TID  . albuterol  5 mg Nebulization Once  . budesonide  0.5 mg Nebulization BID  . ceFEPime (MAXIPIME) IV  2 g Intravenous Q12H  . diltiazem  180 mg Oral Daily  . furosemide  40 mg Oral Daily  . Guaifenesin  1 tablet Oral Once  . loratadine  10 mg Oral Daily  . moxifloxacin  400 mg Intravenous Once  . pantoprazole  20 mg Oral Q1200  . simvastatin  10 mg Oral QHS  . sodium chloride  500 mL Intravenous Once  . tiotropium  18 mcg Inhalation Daily  . vancomycin  1,000 mg Intravenous Once   Infusions:    . sodium chloride 20 mL/hr (06/29/11 1640)  . 0.9 % NaCl with  KCl 20 mEq / L     Assessment: 77 yo admitted with cough, fever likely to have HAP due to recent hospitalization at beginning of month. To begin broad spectrum antibiotics with Cefepime and Vancomycin  Goal of Therapy:  Vancomycin trough level 15-20 mcg/ml  Plan:  Vancomycin 1g IV x 1 given in ER at 1542 today. Will start 750mg  IV q12 and check a trough at steady state.   Hessie Knows, PharmD, BCPS 06/29/2011 6:06 PM [phone]

## 2011-06-30 LAB — CBC
MCH: 27.4 pg (ref 26.0–34.0)
MCHC: 31.1 g/dL (ref 30.0–36.0)
Platelets: 370 10*3/uL (ref 150–400)
RBC: 3.28 MIL/uL — ABNORMAL LOW (ref 3.87–5.11)
RDW: 17.4 % — ABNORMAL HIGH (ref 11.5–15.5)

## 2011-06-30 MED ORDER — WARFARIN SODIUM 6 MG PO TABS
6.0000 mg | ORAL_TABLET | Freq: Once | ORAL | Status: AC
  Administered 2011-06-30: 6 mg via ORAL
  Filled 2011-06-30: qty 1

## 2011-06-30 NOTE — Progress Notes (Signed)
Notified MD on call of patient's 2.11 sec pause. Pt's BP 89/59, P-60. Pt denies any chest pain or SOB.  No new orders at this time. Will continue to monitor patient. Audria Nine Cherie RN

## 2011-06-30 NOTE — Progress Notes (Signed)
Subjective: Pt is comfortable. Feels better than yesterday  Objective: Weight change:   Intake/Output Summary (Last 24 hours) at 06/30/11 1151 Last data filed at 06/30/11 1150  Gross per 24 hour  Intake   2340 ml  Output   2225 ml  Net    115 ml   On exam  Pt is alert afebrile comfortable CVS: irregular,  LUngs: rales on the left lower base Abdomen: soft NT ND Bowel sounds heard Extremities: no pedal edema Neuro: no focal deficits.   Lab Results: Results for orders placed during the hospital encounter of 06/29/11 (from the past 24 hour(s))  COMPREHENSIVE METABOLIC PANEL     Status: Abnormal   Collection Time   06/29/11  1:40 PM      Component Value Range   Sodium 140  135 - 145 (mEq/L)   Potassium 3.6  3.5 - 5.1 (mEq/L)   Chloride 104  96 - 112 (mEq/L)   CO2 29  19 - 32 (mEq/L)   Glucose, Bld 119 (*) 70 - 99 (mg/dL)   BUN 13  6 - 23 (mg/dL)   Creatinine, Ser 7.82  0.50 - 1.10 (mg/dL)   Calcium 9.2  8.4 - 95.6 (mg/dL)   Total Protein 5.3 (*) 6.0 - 8.3 (g/dL)   Albumin 2.9 (*) 3.5 - 5.2 (g/dL)   AST 17  0 - 37 (U/L)   ALT 23  0 - 35 (U/L)   Alkaline Phosphatase 68  39 - 117 (U/L)   Total Bilirubin 0.3  0.3 - 1.2 (mg/dL)   GFR calc non Af Amer 63 (*) >90 (mL/min)   GFR calc Af Amer 73 (*) >90 (mL/min)  CBC     Status: Abnormal   Collection Time   06/29/11  1:40 PM      Component Value Range   WBC 15.7 (*) 4.0 - 10.5 (K/uL)   RBC 3.87  3.87 - 5.11 (MIL/uL)   Hemoglobin 10.4 (*) 12.0 - 15.0 (g/dL)   HCT 21.3 (*) 08.6 - 46.0 (%)   MCV 87.3  78.0 - 100.0 (fL)   MCH 26.9  26.0 - 34.0 (pg)   MCHC 30.8  30.0 - 36.0 (g/dL)   RDW 57.8 (*) 46.9 - 15.5 (%)   Platelets 429 (*) 150 - 400 (K/uL)  LACTIC ACID, PLASMA     Status: Normal   Collection Time   06/29/11  1:46 PM      Component Value Range   Lactic Acid, Venous 1.5  0.5 - 2.2 (mmol/L)  URINALYSIS, ROUTINE W REFLEX MICROSCOPIC     Status: Abnormal   Collection Time   06/29/11  1:49 PM      Component Value Range     Color, Urine YELLOW  YELLOW    APPearance CLOUDY (*) CLEAR    Specific Gravity, Urine 1.015  1.005 - 1.030    pH 8.0  5.0 - 8.0    Glucose, UA NEGATIVE  NEGATIVE (mg/dL)   Hgb urine dipstick NEGATIVE  NEGATIVE    Bilirubin Urine NEGATIVE  NEGATIVE    Ketones, ur NEGATIVE  NEGATIVE (mg/dL)   Protein, ur NEGATIVE  NEGATIVE (mg/dL)   Urobilinogen, UA 1.0  0.0 - 1.0 (mg/dL)   Nitrite NEGATIVE  NEGATIVE    Leukocytes, UA NEGATIVE  NEGATIVE   CULTURE, BLOOD (ROUTINE X 2)     Status: Normal (Preliminary result)   Collection Time   06/29/11  2:00 PM      Component Value Range   Specimen  Description BLOOD LEFT HAND     Special Requests BOTTLES DRAWN AEROBIC AND ANAEROBIC 5CC     Setup Time [id number]     Culture       Value:        BLOOD CULTURE RECEIVED NO GROWTH TO DATE CULTURE WILL BE HELD FOR 5 DAYS BEFORE ISSUING A FINAL NEGATIVE REPORT   Report Status PENDING    CULTURE, BLOOD (ROUTINE X 2)     Status: Normal (Preliminary result)   Collection Time   06/29/11  2:05 PM      Component Value Range   Specimen Description BLOOD LEFT ARM     Special Requests BOTTLES DRAWN AEROBIC AND ANAEROBIC 5 CC EACH     Setup Time [id number]     Culture       Value:        BLOOD CULTURE RECEIVED NO GROWTH TO DATE CULTURE WILL BE HELD FOR 5 DAYS BEFORE ISSUING A FINAL NEGATIVE REPORT   Report Status PENDING    PROTIME-INR     Status: Abnormal   Collection Time   06/29/11  5:40 PM      Component Value Range   Prothrombin Time 17.0 (*) 11.6 - 15.2 (seconds)   INR 1.36  0.00 - 1.49   MRSA PCR SCREENING     Status: Normal   Collection Time   06/29/11  7:48 PM      Component Value Range   MRSA by PCR NEGATIVE  NEGATIVE   CBC     Status: Abnormal   Collection Time   06/30/11  4:46 AM      Component Value Range   WBC 10.3  4.0 - 10.5 (K/uL)   RBC 3.28 (*) 3.87 - 5.11 (MIL/uL)   Hemoglobin 9.0 (*) 12.0 - 15.0 (g/dL)   HCT 21.3 (*) 08.6 - 46.0 (%)   MCV 88.1  78.0 - 100.0 (fL)   MCH 27.4   26.0 - 34.0 (pg)   MCHC 31.1  30.0 - 36.0 (g/dL)   RDW 57.8 (*) 46.9 - 15.5 (%)   Platelets 370  150 - 400 (K/uL)  PROTIME-INR     Status: Abnormal   Collection Time   06/30/11  4:46 AM      Component Value Range   Prothrombin Time 17.1 (*) 11.6 - 15.2 (seconds)   INR 1.37  0.00 - 1.49      Micro Results: Recent Results (from the past 240 hour(s))  CULTURE, BLOOD (ROUTINE X 2)     Status: Normal (Preliminary result)   Collection Time   06/29/11  2:00 PM      Component Value Range Status Comment   Specimen Description BLOOD LEFT HAND   Final    Special Requests BOTTLES DRAWN AEROBIC AND ANAEROBIC 5CC   Final    Setup Time [id number]   Final    Culture     Final    Value:        BLOOD CULTURE RECEIVED NO GROWTH TO DATE CULTURE WILL BE HELD FOR 5 DAYS BEFORE ISSUING A FINAL NEGATIVE REPORT   Report Status PENDING   Incomplete   CULTURE, BLOOD (ROUTINE X 2)     Status: Normal (Preliminary result)   Collection Time   06/29/11  2:05 PM      Component Value Range Status Comment   Specimen Description BLOOD LEFT ARM   Final    Special Requests BOTTLES DRAWN AEROBIC AND ANAEROBIC 5 CC EACH   Final  Setup Time [id number]   Final    Culture     Final    Value:        BLOOD CULTURE RECEIVED NO GROWTH TO DATE CULTURE WILL BE HELD FOR 5 DAYS BEFORE ISSUING A FINAL NEGATIVE REPORT   Report Status PENDING   Incomplete   MRSA PCR SCREENING     Status: Normal   Collection Time   06/29/11  7:48 PM      Component Value Range Status Comment   MRSA by PCR NEGATIVE  NEGATIVE  Final     Studies/Results: Dg Chest 2 View  06/29/2011  *RADIOLOGY REPORT*  Clinical Data: Fever, cough, shortness of breath, history hypertension  CHEST - 2 VIEW  Comparison: 06/15/2011  Findings: Enlargement of cardiac silhouette. Calcified tortuous aorta. Pulmonary vascularity normal. Small hiatal hernia. Patchy left mid lung infiltrate consistent with pneumonia. Underlying emphysematous and bronchitic changes. No  pleural effusion or pneumothorax. Biapical pleural thickening and scarring unchanged. Bones diffusely demineralized.  IMPRESSION: Left mid lung infiltrates consistent with pneumonia. Underlying emphysematous and chronic bronchitic changes. Enlargement of cardiac silhouette.  Original Report Authenticated By: Lollie Marrow, M.D.   Dg Chest 2 View  06/15/2011  *RADIOLOGY REPORT*  Clinical Data:  Rule out pneumonia.  CHEST - 2 VIEW  Comparison: 06/14/2011  Findings: Heart size appears mildly enlarged.  Interval improvement and pleural effusions and pulmonary venous congestion.  Coarsened interstitial markings are noted bilaterally, similar to previous exam.  Moderate sized hiatal hernia is present.  IMPRESSION:  1.  Improvement in CHF pattern.  Original Report Authenticated By: Rosealee Albee, M.D.   Dg Chest 2 View  06/14/2011  *RADIOLOGY REPORT*  Clinical Data: Short of breath  CHEST - 2 VIEW  Comparison: December 04, 2010  Findings: Cardiomegaly.  Mild diffuse edema. No pneumothorax.  No pleural effusion.  Bibasilar atelectasis and low volumes.  IMPRESSION: Mild CHF.  Original Report Authenticated By: Donavan Burnet, M.D.   Medications: Scheduled Meds:   . albuterol  2.5 mg Nebulization TID  . albuterol  5 mg Nebulization Once  . budesonide  0.5 mg Nebulization BID  . ceFEPime (MAXIPIME) IV  2 g Intravenous Q12H  . diltiazem  180 mg Oral Daily  . furosemide  40 mg Oral Daily  . guaiFENesin  1,200 mg Oral Once  . loratadine  10 mg Oral Daily  . moxifloxacin  400 mg Intravenous Once  . pantoprazole  20 mg Oral Q1200  . simvastatin  10 mg Oral QHS  . sodium chloride  500 mL Intravenous Once  . tiotropium  18 mcg Inhalation Daily  . vancomycin  750 mg Intravenous Q12H  . vancomycin  1,000 mg Intravenous Once  . warfarin  6 mg Oral ONCE-1800  . warfarin  7.5 mg Oral Once   Continuous Infusions:   . sodium chloride 20 mL/hr (06/29/11 1640)  . 0.9 % NaCl with KCl 20 mEq / L 75 mL/hr at  06/30/11 0700   PRN Meds:.  Assessment/Plan: Health Care Associated Pneumonia: on vancomycin, cefepime and avelox day 2 of antibiotics. Will repeat a CXR in am . Blood cultures pending so far. Nasal oxygen as needed.   Copd : no wheezing heard on my exam. Continue with bronchodilator therapy and budesonide inhalations.   Atrial Fibrillations: rate controlled. Therapeutic INR. Continue with rate control meds, diltiazem coumadin as per pharmacy.   Hyperlipidemia: continue with simvastatin  Congestive heart failure: appears to be compensated. Continue with lasix.  DVT prophylaxis: on coumadin.   LOS: 1 day   Pietro Bonura 06/30/2011, 11:51 AM

## 2011-06-30 NOTE — Progress Notes (Signed)
ANTICOAGULATION CONSULT NOTE - Follow Up Consult  Pharmacy Consult for warfarin Indication: atrial fibrillation  Labs:  Basename 06/30/11 0446 06/29/11 1740 06/29/11 1340  HGB 9.0* -- 10.4*  HCT 28.9* -- 33.8*  PLT 370 -- 429*  APTT -- -- --  LABPROT 17.1* 17.0* --  INR 1.37 1.36 --  HEPARINUNFRC -- -- --  CREATININE -- -- 0.82  CKTOTAL -- -- --  CKMB -- -- --  TROPONINI -- -- --   Estimated Creatinine Clearance: 48.8 ml/min (by C-G formula based on Cr of 0.82).  Medical History: Past Medical History  Diagnosis Date  . Atrial fibrillation   . Stroke   . Hypertension   . Arthritis   . GERD (gastroesophageal reflux disease)   . COPD (chronic obstructive pulmonary disease)   . PNA (pneumonia)     Medications:  Scheduled:     . albuterol  2.5 mg Nebulization TID  . albuterol  5 mg Nebulization Once  . budesonide  0.5 mg Nebulization BID  . ceFEPime (MAXIPIME) IV  2 g Intravenous Q12H  . diltiazem  180 mg Oral Daily  . furosemide  40 mg Oral Daily  . guaiFENesin  1,200 mg Oral Once  . loratadine  10 mg Oral Daily  . moxifloxacin  400 mg Intravenous Once  . pantoprazole  20 mg Oral Q1200  . simvastatin  10 mg Oral QHS  . sodium chloride  500 mL Intravenous Once  . tiotropium  18 mcg Inhalation Daily  . vancomycin  750 mg Intravenous Q12H  . vancomycin  1,000 mg Intravenous Once  . warfarin  7.5 mg Oral Once   Assessment: 77 yo admitted with HAP to continue her warfarin she was taking PTA for hx Afib. Patient's home dose PTA was 4mg  daily except 6mg  Mon/Wed/Fri. Patient's INR subtherapeutic on admission. Received 7.5mg  boost dose last night. No bleeding/complications reported.  Goal of Therapy:  INR 2-3   Plan:  Coumadin 6mg  po once today. Follow up INR in AM.  Clance Boll, PharmD Pager: [phone] 06/30/2011 9:15 AM

## 2011-06-30 NOTE — Progress Notes (Signed)
UR CHART REVIEWED; B Kaycen Whitworth RN, BSN, MHA 

## 2011-07-01 ENCOUNTER — Inpatient Hospital Stay (HOSPITAL_COMMUNITY): Payer: Medicare Other

## 2011-07-01 LAB — BASIC METABOLIC PANEL
BUN: 10 mg/dL (ref 6–23)
Calcium: 8.3 mg/dL — ABNORMAL LOW (ref 8.4–10.5)
Creatinine, Ser: 0.78 mg/dL (ref 0.50–1.10)
GFR calc Af Amer: 85 mL/min — ABNORMAL LOW (ref >90)
GFR calc non Af Amer: 74 mL/min — ABNORMAL LOW (ref >90)
Glucose, Bld: 96 mg/dL (ref 70–99)
Potassium: 3.7 mEq/L (ref 3.5–5.1)

## 2011-07-01 LAB — CBC
HCT: 28.6 % — ABNORMAL LOW (ref 36.0–46.0)
Hemoglobin: 9 g/dL — ABNORMAL LOW (ref 12.0–15.0)
MCH: 27.7 pg (ref 26.0–34.0)
MCHC: 31.5 g/dL (ref 30.0–36.0)
MCV: 88 fL (ref 78.0–100.0)
RDW: 17 % — ABNORMAL HIGH (ref 11.5–15.5)

## 2011-07-01 LAB — PROTIME-INR: INR: 1.48 (ref 0.00–1.49)

## 2011-07-01 MED ORDER — WARFARIN SODIUM 7.5 MG PO TABS
7.5000 mg | ORAL_TABLET | Freq: Once | ORAL | Status: AC
  Administered 2011-07-01: 7.5 mg via ORAL
  Filled 2011-07-01: qty 1

## 2011-07-01 NOTE — Progress Notes (Signed)
Physical Therapy Evaluation Patient Details Name: Angelica [name] MRN: [id number] DOB: 1922/08/09 Today's Date: 07/01/2011 Time: 2130-8657  Eval II Problem List:  Patient Active Problem List  Diagnoses  . A-fib, rate controlled on coumadin  . CVA (cerebral vascular accident)  . CHF (congestive heart failure)  . COPD   . 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 Pneumonia    Past Medical History:  Past Medical History  Diagnosis Date  . Atrial fibrillation   . Stroke   . Hypertension   . Arthritis   . GERD (gastroesophageal reflux disease)   . COPD (chronic obstructive pulmonary disease)   . PNA (pneumonia)    Past Surgical History:  Past Surgical History  Procedure Date  . Hip fracture surgery   . Cholecystectomy     PT Assessment/Plan/Recommendation PT Assessment Clinical Impression Statement: Pt presents with diagnosis of Pna. Pt will benefit from skilled PT in the acute care setting to maximize independence and safety with basic functional mobility in preperation for D/C home.  Recommend 24 hour supervision initially. PT Recommendation/Assessment: Patient will need skilled PT in the acute care venue PT Problem List: Decreased balance;Decreased mobility;Decreased cognition PT Therapy Diagnosis : Difficulty walking;Abnormality of gait PT Plan PT Frequency: Min 3X/week PT Treatment/Interventions: DME instruction;Gait training;Functional mobility training;Therapeutic activities;Patient/family education PT Recommendation Recommendations for Other Services: OT consult Follow Up Recommendations: 24 hour supervision/assistance initially;Home health PT Equipment Recommended: None recommended by PT (pt states she has RW available at home). Pt may benefit from RW use until balance improves.  PT Goals  Acute Rehab PT Goals PT Goal Formulation: With patient Pt will Ambulate: 51 - 150 feet;with least restrictive assistive device;with modified independence PT Goal: Ambulate - Progress: Not met Pt  will Go Up / Down Stairs: 3-5 stairs;with rail(s) (3 steps) PT Goal: Up/Down Stairs - Progress: Not met  PT Evaluation Precautions/Restrictions  Precautions Precautions: Fall Prior Functioning  Home Living Lives With: Spouse Receives Help From: Family Type of Home: House Home Layout: Two level;Able to live on main level with bedroom/bathroom Home Access: Stairs to enter Entrance Stairs-Rails: Right Entrance Stairs-Number of Steps: 3 Home Adaptive Equipment: Walker - rolling Prior Function Level of Independence: Independent with basic ADLs;Independent with gait Cognition Cognition Arousal/Alertness: Awake/alert Overall Cognitive Status: Impaired Memory: Appears impaired Memory Deficits: Pt unable to state location beyond "hospital", year, or reason for admission Orientation Level: Oriented to person Safety/Judgement: Decreased safety judgement for tasks assessed Sensation/Coordination Sensation Light Touch: Appears Intact Coordination Gross Motor Movements are Fluid and Coordinated: Yes Extremity Assessment RLE Strength RLE Overall Strength Comments: Strength > or = 3+/5 LLE Strength LLE Overall Strength Comments: Strength > or = 3+/5 with functional activity Mobility (including Balance) Bed Mobility Bed Mobility: Yes Supine to Sit: 7: Independent Transfers Transfers: Yes Sit to Stand: 7: Independent Stand to Sit: 7: Independent Ambulation/Gait Ambulation/Gait: Yes Ambulation/Gait Assistance: 4: Min assist Ambulation/Gait Assistance Details (indicate cue type and reason): Assist to steady pt intermittently. Pt reaching out for rail/objects to hold onto intermittently. Pt with abnormal gait pattern-demonstrating limp-denies pain Ambulation Distance (Feet): 100 Feet Assistive device: None Gait Pattern: Step-through pattern  Posture/Postural Control Posture/Postural Control: No significant limitations Balance Balance Assessed: No (Unsteady with ambulation) Exercise      End of Session PT - End of Session Equipment Utilized During Treatment: Gait belt Activity Tolerance: Patient tolerated treatment well Patient left: in chair;with call bell in reach Nurse Communication: Mobility status for ambulation General Behavior During Session: [facility] for tasks performed Cognition: Impaired, at baseline  Rebeca Alert [facility] 07/01/2011, 9:46 AM [phone]

## 2011-07-01 NOTE — Progress Notes (Signed)
Occupational Therapy Evaluation Patient Details Name: Angelica [name] MRN: [id number] DOB: [birth year] Today's Date: 07/01/2011 EV2 [id number] Problem List:  Patient Active Problem List  Diagnoses  . A-fib, rate controlled on coumadin  . CVA (cerebral vascular accident)  . CHF (congestive heart failure)  . COPD   . 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 Pneumonia    Past Medical History:  Past Medical History  Diagnosis Date  . Atrial fibrillation   . Stroke   . Hypertension   . Arthritis   . GERD (gastroesophageal reflux disease)   . COPD (chronic obstructive pulmonary disease)   . PNA (pneumonia)    Past Surgical History:  Past Surgical History  Procedure Date  . Hip fracture surgery   . Cholecystectomy     OT Assessment/Plan/Recommendation OT Assessment Clinical Impression Statement: Pt presents w/overall decreased activity tolerance, ability to successfully complete basic self care at baseline level. Skilled OT recommended to maximize I with ADLs for safe d/c home w HHOT. OT Recommendation/Assessment: Patient will need skilled OT in the acute care venue OT Problem List: Decreased activity tolerance;Decreased cognition;Decreased safety awareness;Decreased knowledge of use of DME or AE OT Therapy Diagnosis : Generalized weakness OT Plan OT Frequency: Min 2X/week OT Treatment/Interventions: Self-care/ADL training;DME and/or AE instruction;Therapeutic activities;Patient/family education OT Recommendation Follow Up Recommendations: Home health OT;24 hour supervision/assistance Equipment Recommended: None recommended by OT Individuals Consulted Consulted and Agree with Results and Recommendations: Family member/caregiver Family Member Consulted: son OT Goals Acute Rehab OT Goals OT Goal Formulation: With patient/family Time For Goal Achievement: 2 weeks ADL Goals Pt Will Perform Grooming: with modified independence;Standing at sink;Other (comment) (x 3 tasks to increase standing  activity tolerance.) ADL Goal: Grooming - Progress: Not met Pt Will Perform Upper Body Bathing: with modified independence;Sitting, edge of bed;Unsupported;Standing at sink ADL Goal: Upper Body Bathing - Progress: Not met Pt Will Perform Lower Body Bathing: with modified independence;Sit to stand from bed;Sit to stand from chair ADL Goal: Lower Body Bathing - Progress: Progressing toward goals Pt Will Perform Upper Body Dressing: with modified independence;Sitting, bed;Sitting, chair;Unsupported ADL Goal: Upper Body Dressing - Progress: Progressing toward goals Pt Will Perform Lower Body Dressing: with modified independence;Sit to stand from chair;Sit to stand from bed ADL Goal: Lower Body Dressing - Progress: Not met Pt Will Transfer to Toilet: with modified independence;Ambulation;Regular height toilet ADL Goal: Toilet Transfer - Progress: Progressing toward goals Pt Will Perform Toileting - Clothing Manipulation: with modified independence;Standing ADL Goal: Toileting - Clothing Manipulation - Progress: Progressing toward goals Pt Will Perform Toileting - Hygiene: with modified independence;Sit to stand from 3-in-1/toilet ADL Goal: Toileting - Hygiene - Progress: Progressing toward goals Pt Will Perform Tub/Shower Transfer: with modified independence;Ambulation;Grab bars ADL Goal: Tub/Shower Transfer - Progress: Not met  OT Evaluation Precautions/Restrictions  Precautions Precautions: Fall Restrictions Weight Bearing Restrictions: No Prior Functioning Home Living Bathroom Shower/Tub: Tub/shower unit Bathroom Toilet: Standard Bathroom Accessibility: No Home Adaptive Equipment: Walker - rolling;Grab bars in shower Additional Comments: At end of eval, son arrived & accurate info was obtained from him. Prior Function Level of Independence: Independent with basic ADLs;Independent with transfers;Independent with gait Driving: No Vocation: Retired Comments: Pt very vague regarding  home layout & PLOF often answering, "I dont know..." ADL ADL Eating/Feeding: Performed;Independent Where Assessed - Eating/Feeding: Bed level Grooming: Simulated;Set up Where Assessed - Grooming: Sitting, bed;Unsupported Upper Body Bathing: Simulated;Set up Where Assessed - Upper Body Bathing: Unsupported;Sitting, bed Lower Body Bathing: Performed;Minimal assistance Where Assessed - Lower Body Bathing: Sit to stand from  bed Upper Body Dressing: Performed;Set up Where Assessed - Upper Body Dressing: Unsupported;Sitting, bed Lower Body Dressing: Simulated;Minimal assistance Where Assessed - Lower Body Dressing: Sit to stand from bed Toilet Transfer: Performed;Supervision/safety Toilet Transfer Method: Stand pivot Toilet Transfer Equipment: Bedside commode Toileting - Clothing Manipulation: Simulated;Supervision/safety Where Assessed - Toileting Clothing Manipulation: Sit to stand from 3-in-1 or toilet Toileting - Hygiene: Performed;Minimal assistance;Other (comment) (A needed for back peri area.) Toileting - Hygiene Details (indicate cue type and reason): Pt incontinent of bladder. Where Assessed - Toileting Hygiene: Sit to stand from 3-in-1 or toilet Tub/Shower Transfer: Not assessed Tub/Shower Transfer Method: Not assessed Equipment Used:  (3:1) ADL Comments: Eval limited. Pt anxious to return BTB. Limited activity tolerance. Vision/Perception  Vision - History Baseline Vision: Wears glasses only for reading Patient Visual Report: No change from baseline Vision - Assessment Vision Assessment: Vision not tested Cognition Cognition Arousal/Alertness: Awake/alert Overall Cognitive Status: Impaired Memory: Appears impaired Memory Deficits: Pt admits to having problems w/ memory. Unable to recall home layout or PLOF at home. Orientation Level: Oriented to person;Oriented to place;Oriented to situation;Disoriented to time;Other (Comment) (Pt unable to recall the  state.) Safety/Judgement: Decreased safety judgement for tasks assessed Decreased Safety/Judgement: Impulsive Sensation/Coordination   Extremity Assessment RUE Assessment RUE Assessment: Within Functional Limits LUE Assessment LUE Assessment: Within Functional Limits Mobility  Bed Mobility Bed Mobility: Yes Sit to Supine - Left: 6: Modified independent (Device/Increase time);With rail;HOB flat Transfers Transfers: Yes Sit to Stand: 5: Supervision;From chair/3-in-1 Stand to Sit: 5: Supervision;With upper extremity assist;To bed Exercises   End of Session OT - End of Session Equipment Utilized During Treatment:  (3:1) Activity Tolerance: Patient limited by fatigue Patient left: in bed;with call bell in reach;with family/visitor present Nurse Communication: Other (comment) (decreased cognitive level.) General Behavior During Session: [facility] for tasks performed Cognition: Impaired, at baseline   Kobi Mario A [phone] 07/01/2011, 2:27 PM

## 2011-07-01 NOTE — Progress Notes (Signed)
Subjective: Getting better. Wants to go home.  Objective: Weight change: 0.913 kg (2 lb 0.2 oz)  Intake/Output Summary (Last 24 hours) at 07/01/11 1549 Last data filed at 07/01/11 1300  Gross per 24 hour  Intake   1820 ml  Output   1300 ml  Net    520 ml   On exam  Pt is alert afebrile comfortable  CVS: irregular,  LUngs: rales on the left lower base  Abdomen: soft NT ND Bowel sounds heard  Extremities: no pedal edema  Neuro: no focal deficits.    Lab Results: Results for orders placed during the hospital encounter of 06/29/11 (from the past 24 hour(s))  PROTIME-INR     Status: Abnormal   Collection Time   07/01/11  4:25 AM      Component Value Range   Prothrombin Time 18.2 (*) 11.6 - 15.2 (seconds)   INR 1.48  0.00 - 1.49   CBC     Status: Abnormal   Collection Time   07/01/11  4:25 AM      Component Value Range   WBC 7.7  4.0 - 10.5 (K/uL)   RBC 3.25 (*) 3.87 - 5.11 (MIL/uL)   Hemoglobin 9.0 (*) 12.0 - 15.0 (g/dL)   HCT 16.1 (*) 09.6 - 46.0 (%)   MCV 88.0  78.0 - 100.0 (fL)   MCH 27.7  26.0 - 34.0 (pg)   MCHC 31.5  30.0 - 36.0 (g/dL)   RDW 04.5 (*) 40.9 - 15.5 (%)   Platelets 329  150 - 400 (K/uL)  BASIC METABOLIC PANEL     Status: Abnormal   Collection Time   07/01/11  4:25 AM      Component Value Range   Sodium 141  135 - 145 (mEq/L)   Potassium 3.7  3.5 - 5.1 (mEq/L)   Chloride 110  96 - 112 (mEq/L)   CO2 26  19 - 32 (mEq/L)   Glucose, Bld 96  70 - 99 (mg/dL)   BUN 10  6 - 23 (mg/dL)   Creatinine, Ser 8.11  0.50 - 1.10 (mg/dL)   Calcium 8.3 (*) 8.4 - 10.5 (mg/dL)   GFR calc non Af Amer 74 (*) >90 (mL/min)   GFR calc Af Amer 85 (*) >90 (mL/min)    Micro Results: Recent Results (from the past 240 hour(s))  CULTURE, BLOOD (ROUTINE X 2)     Status: Normal (Preliminary result)   Collection Time   06/29/11  2:00 PM      Component Value Range Status Comment   Specimen Description BLOOD LEFT HAND   Final    Special Requests BOTTLES DRAWN AEROBIC AND  ANAEROBIC 5CC   Final    Setup Time [id number]   Final    Culture     Final    Value:        BLOOD CULTURE RECEIVED NO GROWTH TO DATE CULTURE WILL BE HELD FOR 5 DAYS BEFORE ISSUING A FINAL NEGATIVE REPORT   Report Status PENDING   Incomplete   CULTURE, BLOOD (ROUTINE X 2)     Status: Normal (Preliminary result)   Collection Time   06/29/11  2:05 PM      Component Value Range Status Comment   Specimen Description BLOOD LEFT ARM   Final    Special Requests BOTTLES DRAWN AEROBIC AND ANAEROBIC 5 CC EACH   Final    Setup Time [id number]   Final    Culture     Final  Value:        BLOOD CULTURE RECEIVED NO GROWTH TO DATE CULTURE WILL BE HELD FOR 5 DAYS BEFORE ISSUING A FINAL NEGATIVE REPORT   Report Status PENDING   Incomplete   MRSA PCR SCREENING     Status: Normal   Collection Time   06/29/11  7:48 PM      Component Value Range Status Comment   MRSA by PCR NEGATIVE  NEGATIVE  Final     Studies/Results: Dg Chest 2 View  06/29/2011  *RADIOLOGY REPORT*  Clinical Data: Fever, cough, shortness of breath, history hypertension  CHEST - 2 VIEW  Comparison: 06/15/2011  Findings: Enlargement of cardiac silhouette. Calcified tortuous aorta. Pulmonary vascularity normal. Small hiatal hernia. Patchy left mid lung infiltrate consistent with pneumonia. Underlying emphysematous and bronchitic changes. No pleural effusion or pneumothorax. Biapical pleural thickening and scarring unchanged. Bones diffusely demineralized.  IMPRESSION: Left mid lung infiltrates consistent with pneumonia. Underlying emphysematous and chronic bronchitic changes. Enlargement of cardiac silhouette.  Original Report Authenticated By: Lollie Marrow, M.D.   Dg Chest 2 View  06/15/2011  *RADIOLOGY REPORT*  Clinical Data:  Rule out pneumonia.  CHEST - 2 VIEW  Comparison: 06/14/2011  Findings: Heart size appears mildly enlarged.  Interval improvement and pleural effusions and pulmonary venous congestion.  Coarsened interstitial  markings are noted bilaterally, similar to previous exam.  Moderate sized hiatal hernia is present.  IMPRESSION:  1.  Improvement in CHF pattern.  Original Report Authenticated By: Rosealee Albee, M.D.   Dg Chest 2 View  06/14/2011  *RADIOLOGY REPORT*  Clinical Data: Short of breath  CHEST - 2 VIEW  Comparison: December 04, 2010  Findings: Cardiomegaly.  Mild diffuse edema. No pneumothorax.  No pleural effusion.  Bibasilar atelectasis and low volumes.  IMPRESSION: Mild CHF.  Original Report Authenticated By: Donavan Burnet, M.D.   Medications: Scheduled Meds:   . albuterol  2.5 mg Nebulization TID  . budesonide  0.5 mg Nebulization BID  . ceFEPime (MAXIPIME) IV  2 g Intravenous Q12H  . diltiazem  180 mg Oral Daily  . furosemide  40 mg Oral Daily  . loratadine  10 mg Oral Daily  . pantoprazole  20 mg Oral Q1200  . simvastatin  10 mg Oral QHS  . tiotropium  18 mcg Inhalation Daily  . vancomycin  750 mg Intravenous Q12H  . warfarin  6 mg Oral ONCE-1800  . warfarin  7.5 mg Oral ONCE-1800   Continuous Infusions:   . sodium chloride 20 mL/hr (06/29/11 1640)  . 0.9 % NaCl with KCl 20 mEq / L 75 mL/hr at 06/30/11 1259   PRN Meds:.  Assessment/Plan: Patient Active Hospital Problem List: Health Care Associated Pneumonia: on vancomycin, cefepime and avelox day 2 of antibiotics.  Will repeat a CXR now . Blood cultures negative.  Nasal oxygen as needed.  Copd : no wheezing heard on my exam. Continue with bronchodilator therapy and budesonide inhalations.  Atrial Fibrillations: rate controlled. Therapeutic INR. Continue with rate control meds, diltiazem, coumadin as per pharmacy.  Hyperlipidemia: continue with simvastatin  Congestive heart failure: appears to be compensated. Continue with lasix.  DVT prophylaxis: on coumadin.     LOS: 2 days   Tanya Marvin 07/01/2011, 3:49 PM

## 2011-07-01 NOTE — Progress Notes (Signed)
ANTIBIOTIC CONSULT NOTE - FOLLOW UP  Pharmacy Consult for Vanco  Indication: pneumonia (HAP)  Allergies  Allergen Reactions  . Amoxicillin Hives  . Penicillins Hives    Patient Measurements: Height: 5\' 5"  (165.1 cm) Weight: 159 lb 9.8 oz (72.4 kg) (standing scale C) IBW/kg (Calculated) : 57   Vital Signs: Temp: 99.3 F (37.4 C) (12/28 1336) Temp src: Oral (12/28 1336) BP: 103/61 mmHg (12/28 1336) Pulse Rate: 76  (12/28 1412) Intake/Output from previous day: 12/27 0701 - 12/28 0700 In: 2120 [P.O.:720; I.V.:1200; IV Piggyback:200] Out: 1550 [Urine:1550] Intake/Output from this shift: Total I/O In: 480 [P.O.:480] Out: 1250 [Urine:1250]  Labs:  [facility] 07/01/11 0425 06/30/11 0446 06/29/11 1340  WBC 7.7 10.3 15.7*  HGB 9.0* 9.0* 10.4*  PLT 329 370 429*  LABCREA -- -- --  CREATININE 0.78 -- 0.82   Estimated Creatinine Clearance: 50.4 ml/min (by C-G formula based on Cr of 0.78).  Basename 07/01/11 1524  VANCOTROUGH 15.6  VANCOPEAK --  VANCORANDOM --  GENTTROUGH --  GENTPEAK --  GENTRANDOM --  TOBRATROUGH --  TOBRAPEAK --  TOBRARND --  AMIKACINPEAK --  AMIKACINTROU --  AMIKACIN --     Microbiology: Recent Results (from the past 720 hour(s))  CULTURE, BLOOD (ROUTINE X 2)     Status: Normal (Preliminary result)   Collection Time   06/29/11  2:00 PM      Component Value Range Status Comment   Specimen Description BLOOD LEFT HAND   Final    Special Requests BOTTLES DRAWN AEROBIC AND ANAEROBIC 5CC   Final    Setup Time [id number]   Final    Culture     Final    Value:        BLOOD CULTURE RECEIVED NO GROWTH TO DATE CULTURE WILL BE HELD FOR 5 DAYS BEFORE ISSUING A FINAL NEGATIVE REPORT   Report Status PENDING   Incomplete   CULTURE, BLOOD (ROUTINE X 2)     Status: Normal (Preliminary result)   Collection Time   06/29/11  2:05 PM      Component Value Range Status Comment   Specimen Description BLOOD LEFT ARM   Final    Special Requests BOTTLES DRAWN  AEROBIC AND ANAEROBIC 5 CC EACH   Final    Setup Time [id number]   Final    Culture     Final    Value:        BLOOD CULTURE RECEIVED NO GROWTH TO DATE CULTURE WILL BE HELD FOR 5 DAYS BEFORE ISSUING A FINAL NEGATIVE REPORT   Report Status PENDING   Incomplete   MRSA PCR SCREENING     Status: Normal   Collection Time   06/29/11  7:48 PM      Component Value Range Status Comment   MRSA by PCR NEGATIVE  NEGATIVE  Final     Anti-infectives     Start     Dose/Rate Route Frequency Ordered Stop   06/30/11 0400   vancomycin (VANCOCIN) 750 mg in sodium chloride 0.9 % 150 mL IVPB        750 mg 150 mL/hr over 60 Minutes Intravenous Every 12 hours 06/29/11 1807     06/29/11 2200   ceFEPIme (MAXIPIME) 2 g in dextrose 5 % 50 mL IVPB        2 g 100 mL/hr over 30 Minutes Intravenous Every 12 hours 06/29/11 1759     06/29/11 1600   vancomycin (VANCOCIN) IVPB 1000 mg/200 mL premix  1,000 mg 200 mL/hr over 60 Minutes Intravenous  Once 06/29/11 1446 06/29/11 1642   06/29/11 1500   moxifloxacin (AVELOX) IVPB 400 mg        400 mg 250 mL/hr over 60 Minutes Intravenous  Once 06/29/11 1446 06/29/11 1739          Assessment: 77 yo F on Day # 3 Vanco 750mg  IV q12h (started 12/26pm) plus cefepime 2g IV q12h. Vanco trough in goal range 15-20. Last dose given 2 hours late (at 6am instead of 4am). For now, will continue current Vanco regimen (anticipate accumulation) and recheck trough in a few days if it continues.    Goal of Therapy:  Vancomycin trough level 15-20 mcg/ml  Plan:  Continue vanco 750mg  IV q12h  Annia Belt 07/01/2011,5:37 PM

## 2011-07-01 NOTE — Progress Notes (Signed)
ANTICOAGULATION CONSULT NOTE - Follow Up Consult  Pharmacy Consult for warfarin Indication: atrial fibrillation  Labs:  Basename 07/01/11 0425 06/30/11 0446 06/29/11 1740 06/29/11 1340  HGB 9.0* 9.0* -- --  HCT 28.6* 28.9* -- 33.8*  PLT 329 370 -- 429*  APTT -- -- -- --  LABPROT 18.2* 17.1* 17.0* --  INR 1.48 1.37 1.36 --  HEPARINUNFRC -- -- -- --  CREATININE 0.78 -- -- 0.82  CKTOTAL -- -- -- --  CKMB -- -- -- --  TROPONINI -- -- -- --   Estimated Creatinine Clearance: 50.4 ml/min (by C-G formula based on Cr of 0.78).  Medical History: Past Medical History  Diagnosis Date  . Atrial fibrillation   . Stroke   . Hypertension   . Arthritis   . GERD (gastroesophageal reflux disease)   . COPD (chronic obstructive pulmonary disease)   . PNA (pneumonia)     Medications:  Scheduled:     . albuterol  2.5 mg Nebulization TID  . budesonide  0.5 mg Nebulization BID  . ceFEPime (MAXIPIME) IV  2 g Intravenous Q12H  . diltiazem  180 mg Oral Daily  . furosemide  40 mg Oral Daily  . loratadine  10 mg Oral Daily  . pantoprazole  20 mg Oral Q1200  . simvastatin  10 mg Oral QHS  . tiotropium  18 mcg Inhalation Daily  . vancomycin  750 mg Intravenous Q12H  . warfarin  6 mg Oral ONCE-1800   Assessment: 88YOF on coumadin for chronic atrial fibrillation. Patient's home dose PTA was 4mg  daily except 6mg  Mon/Wed/Fri. INR remains subtherapeutic from admission so will provide another boost dose tonight.  No bleeding/complications reported.  Goal of Therapy:  INR 2-3   Plan:  Coumadin 7.5mg  po once today. Follow up INR in AM.  Clance Boll, PharmD Pager: [phone] 07/01/2011 9:36 AM

## 2011-07-02 LAB — PROTIME-INR: INR: 1.67 — ABNORMAL HIGH (ref 0.00–1.49)

## 2011-07-02 MED ORDER — MOXIFLOXACIN HCL 400 MG PO TABS: 400.0000 mg | ORAL_TABLET | Freq: Every day | ORAL | Status: DC

## 2011-07-02 MED ORDER — WARFARIN SODIUM 7.5 MG PO TABS
7.5000 mg | ORAL_TABLET | Freq: Once | ORAL | Status: DC
  Filled 2011-07-02: qty 1

## 2011-07-02 NOTE — Discharge Summary (Signed)
DISCHARGE SUMMARY  Angelica [name]  MR#: [id number]  DOB:1922/10/31  Date of Admission: 06/29/2011 Date of Discharge: 07/02/2011  Attending Physician:Angelica [name]  Patient's WUJ:[id number],NFAOZ, MD, MD  Consults: none  Discharge Diagnoses: Present on Admission:  .Pneumonia .COPD  .Dementia-stage 4-5 .A-fib, rate controlled on coumadin .CHF (congestive heart failure)    Discharge Medication List as of 07/02/2011 11:50 AM    START taking these medications   Details  moxifloxacin (AVELOX) 400 MG tablet Take 1 tablet (400 mg total) by mouth daily., Starting 07/02/2011, Until Tue 07/12/11, Print      CONTINUE these medications which have NOT CHANGED   Details  albuterol (PROVENTIL) (2.5 MG/3ML) 0.083% nebulizer solution Take 2.5 mg by nebulization 3 (three) times daily.  , Until Discontinued, Historical Med    alendronate (FOSAMAX) 70 MG tablet Take 70 mg by mouth every 7 (seven) days. Take with a full glass of water on an empty stomach. On Tuesday, Until Discontinued, Historical Med    budesonide (PULMICORT) 0.5 MG/2ML nebulizer solution Take 0.5 mg by nebulization 2 (two) times daily.  , Until Discontinued, Historical Med    Cholecalciferol (VITAMIN D) 2000 UNITS CAPS Take 1 capsule by mouth daily.  , Until Discontinued, Historical Med    diltiazem (DILACOR XR) 180 MG 24 hr capsule Take 180 mg by mouth daily.  , Until Discontinued, Historical Med    ferrous sulfate 325 (65 FE) MG tablet Take 325 mg by mouth daily.  , Until Discontinued, Historical Med    fexofenadine (ALLEGRA) 180 MG tablet Take 180 mg by mouth daily.  , Until Discontinued, Historical Med    furosemide (LASIX) 40 MG tablet Take 40 mg by mouth daily.  , Until Discontinued, Historical Med    Guaifenesin 1200 MG TB12 Take 1 tablet by mouth once.  , Until Discontinued, Historical Med    lansoprazole (PREVACID) 30 MG capsule Take 30 mg by mouth daily.  , Until Discontinued, Historical Med    Multiple  Vitamins-Minerals (MULTIVITAMINS THER. W/MINERALS) TABS Take 1 tablet by mouth daily.  , Until Discontinued, Historical Med    simvastatin (ZOCOR) 10 MG tablet Take 10 mg by mouth at bedtime.  , Until Discontinued, Historical Med    tiotropium (SPIRIVA) 18 MCG inhalation capsule Place 18 mcg into inhaler and inhale daily.  , Until Discontinued, Historical Med    warfarin (COUMADIN) 4 MG tablet Take 4-6 mg by mouth daily. Tuesday, Thursday, and Saturday=1 tablet Sunday, Monday, Wednesday, and Friday=1.5 tablets  (INR MUST BE KEPT AT 2 OR PATIENT WILL HAVE A GI BLEED PER FAMILY), Until Discontinued, Historical Med      STOP taking these medications     predniSONE (DELTASONE) 20 MG tablet        [name] who has a history of COPD and, stroke, CHF, H. fibrillation who presents emerge department with cough and fever that started earlier today, she was found to have pneumonia and is admitted to hospital for management of pneumonia.     Hospital Course: Health Care Associated Pneumonia:  Received 3 days of iv  vancomycin, cefepime and avelox. Repeat cxr showed m arked improvement in the pneumonia. She was feeling better and wanted to go home very much. Will discharge her on avelox.  And recommended to follow up on one to 2 weeks.   Blood cultures negative. Nasal oxygen as needed.  Copd : no wheezing heard on my exam. Continue with bronchodilator therapy and budesonide inhalations.  Atrial Fibrillations: rate  controlled. Therapeutic INR. Continue with rate control meds, diltiazem, coumadin .Marland Kitchen  Hyperlipidemia: continue with simvastatin  Congestive heart failure: appears to be compensated. Continue with lasix.      Day of Discharge BP 104/62  Pulse 65  Temp(Src) 98.4 F (36.9 C) (Oral)  Resp 18  Ht 5\' 5"  (1.651 m)  Wt 72.349 kg (159 lb 8 oz)  BMI 26.54 kg/m2  SpO2 96%  Physical Exam: On exam  Pt is alert afebrile comfortable  CVS: irregular,  LUngs:  good air entry bilateral.  Abdomen: soft NT ND Bowel sounds heard  Extremities: no pedal edema  Neuro: no focal deficits.    Results for orders placed during the hospital encounter of 06/29/11 (from the past 24 hour(s))  PROTIME-INR     Status: Abnormal   Collection Time   07/02/11  4:00 AM      Component Value Range   Prothrombin Time 20.0 (*) 11.6 - 15.2 (seconds)   INR 1.67 (*) 0.00 - 1.49     Disposition: Home .   Follow-up Appts: Discharge Orders    Future Appointments: Provider: Department: Dept Phone: Center:   07/11/2011 12:00 PM Dava Najjar Elie Goody [facility] [phone] None   07/11/2011 12:15 PM [facility] [phone] None   07/11/2011 12:30 PM Levert Feinstein, MD [facility] [phone] None     Future Orders Please Complete By Expires   Diet - low sodium heart healthy      Increase activity slowly      Discharge instructions      Comments:   Follow up with PCP in one to two weeks and get a repeat cxr for resolution of the pneumonia.       Time spent in discharge (includes decision making & examination of pt): 40 minutes.   SignedKathlen [name] 07/02/2011, 8:00 PM

## 2011-07-02 NOTE — Progress Notes (Signed)
ANTICOAGULATION CONSULT NOTE - Follow Up Consult  Pharmacy Consult for warfarin Indication: atrial fibrillation  Labs:  Basename 07/02/11 0400 07/01/11 0425 06/30/11 0446 06/29/11 1340  HGB -- 9.0* 9.0* --  HCT -- 28.6* 28.9* 33.8*  PLT -- 329 370 429*  APTT -- -- -- --  LABPROT 20.0* 18.2* 17.1* --  INR 1.67* 1.48 1.37 --  HEPARINUNFRC -- -- -- --  CREATININE -- 0.78 -- 0.82  CKTOTAL -- -- -- --  CKMB -- -- -- --  TROPONINI -- -- -- --   Estimated Creatinine Clearance: 50.3 ml/min (by C-G formula based on Cr of 0.78).  Medical History: Past Medical History  Diagnosis Date  . Atrial fibrillation   . Stroke   . Hypertension   . Arthritis   . GERD (gastroesophageal reflux disease)   . COPD (chronic obstructive pulmonary disease)   . PNA (pneumonia)     Medications:  Scheduled:     . albuterol  2.5 mg Nebulization TID  . budesonide  0.5 mg Nebulization BID  . ceFEPime (MAXIPIME) IV  2 g Intravenous Q12H  . diltiazem  180 mg Oral Daily  . furosemide  40 mg Oral Daily  . loratadine  10 mg Oral Daily  . pantoprazole  20 mg Oral Q1200  . simvastatin  10 mg Oral QHS  . tiotropium  18 mcg Inhalation Daily  . vancomycin  750 mg Intravenous Q12H  . warfarin  7.5 mg Oral ONCE-1800   Assessment: 77yo F admitted with SOB, on coumadin for chronic atrial fibrillation. Patient's home dose was 4mg  daily except 6mg  Mon/Wed/Fri. INR still subtherapeutic but rising towards goal. Broad-spectrum abx will likely make her more sensitive. No bleeding/complications reported.  Goal of Therapy:  INR 2-3   Plan:  Coumadin 7.5mg  po again today. Follow up INR daily.  Charolotte Eke, PharmD, pager [phone]. 07/02/2011,10:25 AM.

## 2011-07-05 LAB — CULTURE, BLOOD (ROUTINE X 2)
Culture  Setup Time: 201212262344
Culture: NO GROWTH

## 2011-07-11 ENCOUNTER — Ambulatory Visit (HOSPITAL_BASED_OUTPATIENT_CLINIC_OR_DEPARTMENT_OTHER): Payer: Medicare Other | Admitting: Oncology

## 2011-07-11 ENCOUNTER — Telehealth: Payer: Self-pay | Admitting: Oncology

## 2011-07-11 ENCOUNTER — Ambulatory Visit: Payer: Self-pay | Admitting: Oncology

## 2011-07-11 ENCOUNTER — Ambulatory Visit: Payer: Medicare Other

## 2011-07-11 ENCOUNTER — Encounter: Payer: Self-pay | Admitting: Oncology

## 2011-07-11 ENCOUNTER — Other Ambulatory Visit (HOSPITAL_BASED_OUTPATIENT_CLINIC_OR_DEPARTMENT_OTHER): Payer: Medicare Other

## 2011-07-11 DIAGNOSIS — D500 Iron deficiency anemia secondary to blood loss (chronic): Secondary | ICD-10-CM | POA: Insufficient documentation

## 2011-07-11 DIAGNOSIS — I4891 Unspecified atrial fibrillation: Secondary | ICD-10-CM

## 2011-07-11 DIAGNOSIS — D649 Anemia, unspecified: Secondary | ICD-10-CM

## 2011-07-11 DIAGNOSIS — Q2733 Arteriovenous malformation of digestive system vessel: Secondary | ICD-10-CM

## 2011-07-11 DIAGNOSIS — K922 Gastrointestinal hemorrhage, unspecified: Secondary | ICD-10-CM

## 2011-07-11 DIAGNOSIS — Z7901 Long term (current) use of anticoagulants: Secondary | ICD-10-CM

## 2011-07-11 DIAGNOSIS — K5520 Angiodysplasia of colon without hemorrhage: Secondary | ICD-10-CM

## 2011-07-11 DIAGNOSIS — Z862 Personal history of diseases of the blood and blood-forming organs and certain disorders involving the immune mechanism: Secondary | ICD-10-CM

## 2011-07-11 DIAGNOSIS — I639 Cerebral infarction, unspecified: Secondary | ICD-10-CM

## 2011-07-11 DIAGNOSIS — Z8673 Personal history of transient ischemic attack (TIA), and cerebral infarction without residual deficits: Secondary | ICD-10-CM

## 2011-07-11 HISTORY — DX: Iron deficiency anemia secondary to blood loss (chronic): D50.0

## 2011-07-11 HISTORY — DX: Angiodysplasia of colon without hemorrhage: K55.20

## 2011-07-11 LAB — IRON AND TIBC
%SAT: 8 % — ABNORMAL LOW (ref 20–55)
Iron: 30 ug/dL — ABNORMAL LOW (ref 42–145)
TIBC: 396 ug/dL (ref 250–470)
UIBC: 366 ug/dL (ref 125–400)

## 2011-07-11 LAB — CBC WITH DIFFERENTIAL/PLATELET
BASO%: 0.4 % (ref 0.0–2.0)
Basophils Absolute: 0 10*3/uL (ref 0.0–0.1)
EOS%: 7.5 % — ABNORMAL HIGH (ref 0.0–7.0)
HGB: 10.8 g/dL — ABNORMAL LOW (ref 11.6–15.9)
MCH: 27.4 pg (ref 25.1–34.0)
MCHC: 32.2 g/dL (ref 31.5–36.0)
MCV: 85.3 fL (ref 79.5–101.0)
MONO%: 7.2 % (ref 0.0–14.0)
RDW: 17.9 % — ABNORMAL HIGH (ref 11.2–14.5)
lymph#: 0.7 10*3/uL — ABNORMAL LOW (ref 0.9–3.3)

## 2011-07-11 LAB — PROTIME-INR

## 2011-07-11 LAB — FERRITIN: Ferritin: 62 ng/mL (ref 10–291)

## 2011-07-11 NOTE — Progress Notes (Signed)
Hematology and Oncology Follow Up Visit  Angelica [name] [id number] 1922-12-26 78 y.o. 07/11/2011 7:38 PM   Principle Diagnosis: Encounter Diagnoses  Name Primary?  Marland Kitchen Anemia due to GI blood loss   . Iron deficiency anemia secondary to blood loss (chronic)   . Arteriovenous malformation of gastrointestinal tract      Interim History:   Followup visit for this 82 your woman with chronic GI blood loss 2 to arteriovenous malformations of the GI tract. Situation is complicated by need for Coumadin anticoagulation in view of previous stroke. We have been monitoring her Coumadin levels through are [place] for her convenience. She was in the hospital twice over the last 4 weeks. Initial admission for 5 days in early December for heart failure and then readmission in late December for pneumonia.  Medications: reviewed  Allergies:  Allergies  Allergen Reactions  . Amoxicillin Hives  . Penicillins Hives    Review of Systems: Constitutional:   Recovering her strength from recent hospitalization Respiratory: Currently no dyspnea at rest she denies any orthopnea or PND Cardiovascular:  No chest pain pressure or palpitations Gastrointestinal: No abdominal pain Genito-Urinary:  Musculoskeletal: No bone pain Neurologic: No headache double vision blurry vision focal weakness slurred speech Skin: No rash Remaining ROS negative.  Physical Exam: Blood pressure 118/52, pulse 71, temperature 96.8 F (36 C), temperature source Oral, height 5\' 4"  (1.626 m), weight 160 lb 14.4 oz (72.984 kg). Wt Readings from Last 3 Encounters:  07/11/11 160 lb 14.4 oz (72.984 kg)  07/02/11 159 lb 8 oz (72.349 kg)  06/15/11 151 lb 14.4 oz (68.9 kg)     General appearance: Well-nourished Caucasian woman Head: Normal Neck: Normal carotids 1+ no bruits Lymph nodes: No adenopathy Breasts: Not examined Lungs: Currently clear to auscultation and resonant to percussion Heart: Regular cardiac rhythm no  murmur or gallop Abdomen: Soft nontender no mass no organomegaly Extremities: No edema no calf tenderness Vascular: No cyanosis Neurologic: Mental status intact, she is hard of hearing, pupils equal reactive to light, optic disc sharp on the right not well visualized on the left full extraocular movements motor strength 5 over 5 reflexes 2+ symmetric upper body coordination normal Skin: No rash or ecchymoses  Lab Results: Lab Results  Component Value Date   WBC 5.6 07/11/2011   HGB 10.8* 07/11/2011   HCT 33.7* 07/11/2011   MCV 85.3 07/11/2011   PLT 313 07/11/2011     Chemistry      Component Value Date/Time   NA 141 07/01/2011 0425   K 3.7 07/01/2011 0425   CL 110 07/01/2011 0425   CO2 26 07/01/2011 0425   BUN 10 07/01/2011 0425   CREATININE 0.78 07/01/2011 0425      Component Value Date/Time   CALCIUM 8.3* 07/01/2011 0425   ALKPHOS 68 06/29/2011 1340   AST 17 06/29/2011 1340   ALT 23 06/29/2011 1340   BILITOT 0.3 06/29/2011 1340     Serum iron 30 TIBC 396% saturation 8 ferritin 62 Hemoglobin up from 9 g on 12/28-10.8 g today which is close to her baseline of 11-12 grams  Radiological Studies: No results found.   Impression and Plan: #1. Chronic GI blood loss secondary to arteriovenous malformations of the GI tract. Currently stable. Continue oral iron replacement.  #2. Status post cerebrovascular accident June 2009 with risk factor of chronic atrial fibrillation. Stroke occurred while she was on aspirin. I recommended stopping aspirin and putting her on Coumadin. Relative contraindication to additional antiplatelet agents  in view of her chronic GI bleeding.  #3. Chronic nature fibrillation.  #4. Recent admission for congestive heart failure.  #5. Recent admission for pneumonia  #6. Adult onset asthma  We'll continue to monitor CBCs and protimes on a monthly basis.      Levert Feinstein, MD 1/7/20137:38 PM

## 2011-07-11 NOTE — Progress Notes (Unsigned)
INR therapeutic.  Continue current dose of 6mg  daily except 4mg  on TuThSa.  Recheck INR in 1 month

## 2011-07-11 NOTE — Telephone Encounter (Signed)
appt made and printed for 01/09/12    aom

## 2011-08-10 ENCOUNTER — Other Ambulatory Visit: Payer: Medicare Other | Admitting: Lab

## 2011-08-10 ENCOUNTER — Telehealth: Payer: Self-pay | Admitting: Pharmacist

## 2011-08-10 NOTE — Telephone Encounter (Signed)
Angelica [name] called to let pharmacy know that her mother has been put on another round of antibiotics and prednisone for a COPD exacerbation.  She cannot come to her appointment for an INR today, and wanted to r/s for 08/24/11.  Her mother is not having any problems currently with bleeding or bruising.  She will have completed the antibiotics and prednisone by 08/24/11.

## 2011-08-23 ENCOUNTER — Other Ambulatory Visit: Payer: Self-pay | Admitting: Pharmacist

## 2011-08-23 DIAGNOSIS — I4891 Unspecified atrial fibrillation: Secondary | ICD-10-CM

## 2011-08-24 ENCOUNTER — Other Ambulatory Visit (HOSPITAL_BASED_OUTPATIENT_CLINIC_OR_DEPARTMENT_OTHER): Payer: Medicare Other | Admitting: Lab

## 2011-08-24 DIAGNOSIS — Z7901 Long term (current) use of anticoagulants: Secondary | ICD-10-CM

## 2011-08-24 DIAGNOSIS — I4891 Unspecified atrial fibrillation: Secondary | ICD-10-CM

## 2011-08-24 LAB — CBC WITH DIFFERENTIAL (CANCER CENTER ONLY)
BASO#: 0 10*3/uL (ref 0.0–0.2)
EOS%: 2.4 % (ref 0.0–7.0)
Eosinophils Absolute: 0.2 10*3/uL (ref 0.0–0.5)
HGB: 10.3 g/dL — ABNORMAL LOW (ref 11.6–15.9)
LYMPH%: 4.4 % — ABNORMAL LOW (ref 14.0–48.0)
MCH: 27 pg (ref 26.0–34.0)
MCHC: 30.7 g/dL — ABNORMAL LOW (ref 32.0–36.0)
MCV: 88 fL (ref 81–101)
MONO%: 4.9 % (ref 0.0–13.0)
RBC: 3.81 10*6/uL (ref 3.70–5.32)

## 2011-08-24 LAB — PROTIME-INR (CHCC SATELLITE): Protime: 21.6 Seconds — ABNORMAL HIGH (ref 10.6–13.4)

## 2011-09-13 ENCOUNTER — Ambulatory Visit (HOSPITAL_BASED_OUTPATIENT_CLINIC_OR_DEPARTMENT_OTHER): Payer: Medicare Other | Admitting: Pharmacist

## 2011-09-13 ENCOUNTER — Telehealth: Payer: Self-pay | Admitting: Pharmacist

## 2011-09-13 DIAGNOSIS — I4891 Unspecified atrial fibrillation: Secondary | ICD-10-CM

## 2011-09-13 DIAGNOSIS — I639 Cerebral infarction, unspecified: Secondary | ICD-10-CM

## 2011-09-13 NOTE — Progress Notes (Signed)
INR = 1.8 on 08/24/11.  I spoke w/ [name] today.  She knew the result of the INR from 08/24/11 already. Her mother is having no problems currently re: anticoag.  She has MD appt today & knows to call us if changes are made to meds. Cont same dose & return to [facility] for lab on 10/12/11 at 11:15 am.  We will f/u w/ Bonita Quin when results available. Marily Lente, Pharm.D.

## 2011-09-18 ENCOUNTER — Emergency Department (HOSPITAL_COMMUNITY): Payer: Medicare Other

## 2011-09-18 ENCOUNTER — Encounter (HOSPITAL_COMMUNITY): Payer: Self-pay

## 2011-09-18 ENCOUNTER — Emergency Department (HOSPITAL_COMMUNITY)
Admission: EM | Admit: 2011-09-18 | Discharge: 2011-09-18 | Disposition: A | Discharge status: Home / Self Care | Payer: Medicare Other | Attending: Emergency Medicine | Admitting: Emergency Medicine

## 2011-09-18 ENCOUNTER — Other Ambulatory Visit: Payer: Self-pay

## 2011-09-18 DIAGNOSIS — J4489 Other specified chronic obstructive pulmonary disease: Secondary | ICD-10-CM | POA: Insufficient documentation

## 2011-09-18 DIAGNOSIS — Z8673 Personal history of transient ischemic attack (TIA), and cerebral infarction without residual deficits: Secondary | ICD-10-CM | POA: Insufficient documentation

## 2011-09-18 DIAGNOSIS — R0682 Tachypnea, not elsewhere classified: Secondary | ICD-10-CM | POA: Insufficient documentation

## 2011-09-18 DIAGNOSIS — R0602 Shortness of breath: Secondary | ICD-10-CM | POA: Insufficient documentation

## 2011-09-18 DIAGNOSIS — J449 Chronic obstructive pulmonary disease, unspecified: Secondary | ICD-10-CM | POA: Insufficient documentation

## 2011-09-18 DIAGNOSIS — K219 Gastro-esophageal reflux disease without esophagitis: Secondary | ICD-10-CM | POA: Insufficient documentation

## 2011-09-18 DIAGNOSIS — R05 Cough: Secondary | ICD-10-CM | POA: Insufficient documentation

## 2011-09-18 DIAGNOSIS — J40 Bronchitis, not specified as acute or chronic: Secondary | ICD-10-CM

## 2011-09-18 DIAGNOSIS — I10 Essential (primary) hypertension: Secondary | ICD-10-CM | POA: Insufficient documentation

## 2011-09-18 DIAGNOSIS — R059 Cough, unspecified: Secondary | ICD-10-CM | POA: Insufficient documentation

## 2011-09-18 DIAGNOSIS — I4891 Unspecified atrial fibrillation: Secondary | ICD-10-CM | POA: Insufficient documentation

## 2011-09-18 DIAGNOSIS — M129 Arthropathy, unspecified: Secondary | ICD-10-CM | POA: Insufficient documentation

## 2011-09-18 LAB — BASIC METABOLIC PANEL
CO2: 33 mEq/L — ABNORMAL HIGH (ref 19–32)
Calcium: 9.6 mg/dL (ref 8.4–10.5)
Creatinine, Ser: 0.89 mg/dL (ref 0.50–1.10)
GFR calc Af Amer: 66 mL/min — ABNORMAL LOW (ref >90)
GFR calc non Af Amer: 57 mL/min — ABNORMAL LOW (ref >90)

## 2011-09-18 LAB — DIFFERENTIAL
Basophils Absolute: 0 10*3/uL (ref 0.0–0.1)
Basophils Relative: 1 % (ref 0–1)
Eosinophils Absolute: 0.8 10*3/uL — ABNORMAL HIGH (ref 0.0–0.7)
Eosinophils Relative: 12 % — ABNORMAL HIGH (ref 0–5)
Monocytes Absolute: 0.7 10*3/uL (ref 0.1–1.0)

## 2011-09-18 LAB — CBC
HCT: 34.8 % — ABNORMAL LOW (ref 36.0–46.0)
MCH: 27 pg (ref 26.0–34.0)
MCHC: 30.7 g/dL (ref 30.0–36.0)
MCV: 87.9 fL (ref 78.0–100.0)
RDW: 15.9 % — ABNORMAL HIGH (ref 11.5–15.5)

## 2011-09-18 MED ORDER — SODIUM CHLORIDE 0.9 % IV SOLN
INTRAVENOUS | Status: DC
  Administered 2011-09-18: 12:00:00 via INTRAVENOUS

## 2011-09-18 MED ORDER — AZITHROMYCIN 250 MG PO TABS
500.0000 mg | ORAL_TABLET | Freq: Once | ORAL | Status: AC
  Administered 2011-09-18: 500 mg via ORAL
  Filled 2011-09-18: qty 2

## 2011-09-18 MED ORDER — IPRATROPIUM BROMIDE 0.02 % IN SOLN
0.5000 mg | Freq: Once | RESPIRATORY_TRACT | Status: AC
  Administered 2011-09-18: 0.5 mg via RESPIRATORY_TRACT
  Filled 2011-09-18: qty 2.5

## 2011-09-18 MED ORDER — AZITHROMYCIN 250 MG PO TABS: 250.0000 mg | ORAL_TABLET | Freq: Every day | ORAL | Status: AC

## 2011-09-18 MED ORDER — ALBUTEROL SULFATE (5 MG/ML) 0.5% IN NEBU
5.0000 mg | INHALATION_SOLUTION | Freq: Once | RESPIRATORY_TRACT | Status: AC
  Administered 2011-09-18: 5 mg via RESPIRATORY_TRACT
  Filled 2011-09-18: qty 1

## 2011-09-18 MED ORDER — PREDNISONE 10 MG PO TABS: 20.0000 mg | ORAL_TABLET | Freq: Every day | ORAL | Status: DC

## 2011-09-18 MED ORDER — PREDNISONE 20 MG PO TABS
60.0000 mg | ORAL_TABLET | Freq: Once | ORAL | Status: AC
  Administered 2011-09-18: 60 mg via ORAL
  Filled 2011-09-18: qty 3

## 2011-09-18 NOTE — Discharge Instructions (Signed)
The chest x-ray does not show pneumonia.  Use Zithromax 1 tablet per day for the next 4 days starting on Monday.  Use prednisone to reduce inflammation in your lungs.  Followup with your doctor this week for reevaluation.  Return for worse or uncontrolled

## 2011-09-18 NOTE — ED Notes (Signed)
Respiratory called for breathing tx

## 2011-09-18 NOTE — ED Provider Notes (Signed)
History     CSN: [id number]  Arrival date & time 09/18/11  1052   First MD Initiated Contact with Patient 09/18/11 1216      Chief Complaint  Patient presents with  . Shortness of Breath    (Consider location/radiation/quality/duration/timing/severity/associated sxs/prior treatment) The history is provided by the patient and a relative.   the patient is an 78 year old female, with a history of a true fibrillation, COPD, and hypertension, who presents to emergency department with a nonproductive cough, and shortness of breath.  For several days.  She does not smoke and has never smoked.  Her husband used to smoke.  She does not use oxygen at home.  She denies fevers, chills, nausea, vomiting, sweating.  She denies chest pain, leg pain or swelling.  She was seen by her primary care physician, and given a steroid injection last Monday.  She has similar symptoms.  Every 4 or 5 weeks and usually after she gets a steroid injection.  Her symptoms resolved, however, this time.  They did not resolve, and they have become worse, so she presented to the emergency department for evaluation  Past Medical History  Diagnosis Date  . Atrial fibrillation   . Stroke   . Hypertension   . Arthritis   . GERD (gastroesophageal reflux disease)   . COPD (chronic obstructive pulmonary disease)   . PNA (pneumonia)   . Anemia due to GI blood loss 07/11/2011  . 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 Hip fracture surgery   . Cholecystectomy     No family history on file.  History  Substance Use Topics  . Smoking status: Never Smoker   . Smokeless tobacco: Never Used  . Alcohol Use: 4.2 oz/week    7 Glasses of wine per week    OB History    Grav Para Term Preterm Abortions TAB SAB Ect Mult Living                  Review of Systems  Constitutional: Negative for  fever, chills and diaphoresis.  HENT: Negative for congestion.   Respiratory: Positive for cough and shortness of breath. Negative for chest tightness.   Cardiovascular: Negative for chest pain and palpitations.  Gastrointestinal: Negative for nausea and vomiting.  Neurological: Negative for headaches.  Psychiatric/Behavioral: Negative for confusion.  All other systems reviewed and are negative.    Allergies  Amoxicillin and Penicillins  Home Medications   Current Outpatient Rx  Name Route Sig Dispense Refill  . ALBUTEROL SULFATE (2.5 MG/3ML) 0.083% IN NEBU Nebulization Take 2.5 mg by nebulization 3 (three) times daily.      . ALENDRONATE SODIUM 70 MG PO TABS Oral Take 70 mg by mouth every Tuesday. Take with a full glass of water on an empty stomach.    . BUDESONIDE 0.5 MG/2ML IN SUSP Nebulization Take 0.5 mg by nebulization 2 (two) times daily.      Marland Kitchen CALCIUM + D PO Oral Take 1 tablet by mouth daily.    Marland Kitchen VITAMIN D 2000 UNITS PO CAPS Oral Take 1 capsule by mouth daily.      Marland Kitchen DILTIAZEM HCL ER 180 MG PO CP24 Oral Take 180 mg by mouth daily.      Marland Kitchen FERROUS SULFATE 325 (65 FE) MG PO TABS Oral Take 325 mg by mouth daily.     . FUROSEMIDE 40  MG PO TABS Oral Take 60 mg by mouth daily.     . GUAIFENESIN ER 1200 MG PO TB12 Oral Take 1 tablet by mouth 2 (two) times daily.     Marland Kitchen LANSOPRAZOLE 30 MG PO CPDR Oral Take 30 mg by mouth daily.      Carma Leaven M PLUS PO TABS Oral Take 1 tablet by mouth daily.      Marland Kitchen SIMVASTATIN 10 MG PO TABS Oral Take 10 mg by mouth at bedtime.      Marland Kitchen TIOTROPIUM BROMIDE MONOHYDRATE 18 MCG IN CAPS Inhalation Place 18 mcg into inhaler and inhale daily.      . WARFARIN SODIUM 4 MG PO TABS Oral Take 4-6 mg by mouth See admin instructions. She takes one tablet (4mg ) on Tuesday, Thursday Saturday and one and a half tablets (6mg ) on Sunday, Monday, Wednesday, and Friday. (INR MUST BE KEPT AT 2 OR PATIENT WILL HAVE A GI BLEED PER FAMILY)      BP 132/61  Pulse 71  Temp(Src)  98.4 F (36.9 C) (Oral)  Resp 22  SpO2 93%  Physical Exam  Vitals reviewed. Constitutional: She is oriented to person, place, and time. She appears well-developed and well-nourished.       Speaking in full some chills without disc  HENT:  Head: Normocephalic and atraumatic.  Eyes: Conjunctivae are normal. Pupils are equal, round, and reactive to light.  Neck: Normal range of motion. Neck supple.  Cardiovascular:  No murmur heard.      Irregular heartbeat  Pulmonary/Chest: She has wheezes. She has no rales.       Tachypnea with shallow respirations, and scattered wheezing  Abdominal: Soft. There is no tenderness.  Musculoskeletal: Normal range of motion.  Neurological: She is alert and oriented to person, place, and time.  Skin: Skin is warm and dry.  Psychiatric: She has a normal mood and affect. Thought content normal.    ED Course  Procedures (including critical care time) 78 year old female, with history of COPD, presents emergency Department with, nonproductive cough, and shortness of breath.  She has no fevers, chest pain or swelling.  We will perform a chest x-ray, and laboratory testing, for investigation of possible pneumonia and treat her symptomatically.  The emergency department  Labs Reviewed  CBC - Abnormal; Notable for the following:    Hemoglobin 10.7 (*)    HCT 34.8 (*)    RDW 15.9 (*)    All other components within normal limits  DIFFERENTIAL - Abnormal; Notable for the following:    Eosinophils Relative 12 (*)    Eosinophils Absolute 0.8 (*)    All other components within normal limits  BASIC METABOLIC PANEL - Abnormal; Notable for the following:    CO2 33 (*)    GFR calc non Af Amer 57 (*)    GFR calc Af Amer 66 (*)    All other components within normal limits   Dg Chest Port 1 View  09/18/2011  *RADIOLOGY REPORT*  Clinical Data: Cough, shortness of breath, question pneumonia  PORTABLE CHEST - 1 VIEW  Comparison: 07/19/11  Findings: Cardiomediastinal  silhouette is stable.  No acute infiltrate or pulmonary edema.  Stable chronic mild interstitial prominence and mild bronchitic changes.  IMPRESSION: No active disease.  No significant change.  Original Report Authenticated By: Natasha Mead, M.D.     No diagnosis found.  ED ECG REPORT   Date: 09/18/2011  EKG Time: 1:31 PM  Rate: 76  Rhythm: atrial fibrillation,  Axis: nl  Intervals:none  ST&T Change: none  Narrative Interpretation: atrial fibrillation with controlled rate     2:08 PM Feels better after tx with nebs.  Moving more air. Lungs cta. No distress.       MDM  COPD No pneumonia.  No respiratory distress or toxicity        Cheri Guppy, MD 09/18/11 1408

## 2011-09-18 NOTE — ED Notes (Signed)
Bed:WA01<BR> Expected date:<BR> Expected time:<BR> Means of arrival:<BR> Comments:<BR> closed

## 2011-09-18 NOTE — ED Notes (Signed)
Per pt and pt's daughter, pt has to have steroid shot and a series of po steroids and po abx every 4-6 weeks for COPD.  Pt states she was given steroid shot last week but not po meds.  Pt states she became increasingly SOB yesterday with increased wheezing.

## 2011-09-18 NOTE — ED Notes (Signed)
Patient here with increasing congestion and shortness of breath. Patient with COPD and had steroid shot earlier in week and no relief. Patient not on oxygen at home

## 2011-09-20 ENCOUNTER — Encounter: Payer: Self-pay | Admitting: Emergency Medicine

## 2011-09-20 ENCOUNTER — Ambulatory Visit (INDEPENDENT_AMBULATORY_CARE_PROVIDER_SITE_OTHER): Payer: Medicare Other | Admitting: Emergency Medicine

## 2011-09-20 VITALS — BP 116/78 | HR 74 | Temp 98.1°F | Ht 64.0 in | Wt 161.8 lb

## 2011-09-20 DIAGNOSIS — J449 Chronic obstructive pulmonary disease, unspecified: Secondary | ICD-10-CM

## 2011-09-20 DIAGNOSIS — J302 Other seasonal allergic rhinitis: Secondary | ICD-10-CM

## 2011-09-20 DIAGNOSIS — J309 Allergic rhinitis, unspecified: Secondary | ICD-10-CM

## 2011-09-20 MED ORDER — LORATADINE 10 MG PO TABS: 10.0000 mg | ORAL_TABLET | Freq: Every day | ORAL | Status: DC

## 2011-09-20 MED ORDER — FLUTICASONE PROPIONATE 50 MCG/ACT NA SUSP: 2.0000 | Freq: Two times a day (BID) | NASAL | Status: DC

## 2011-09-20 NOTE — Progress Notes (Signed)
Subjective:    Patient ID: Angelica [name], female    DOB: 02-16-1923, 78 y.o.   MRN: [id number]  HPI 78 yo woman, never smoker, hx of HTN, A Fib on coumadin, CAD with hx CHF (Dr Rennis Golden), CVA, dementia, allergies formerly on shots now seen at [facility] and sensitive to ragweed. Admitted x 2 12/12 for L PNA +/- pulm edema. Also with ? Dx asthma +/- COPD diagnosed recently clinically. She has had PFT remotely in [place]. Was started budesonide + albuterol for 4+ yrs, recently started on Spiriva without much clinical response. No aspiration sx. No GERD symptoms on a PPI.   Having nasal gtt and congestion, wheeze, cough especially at night. Has had at least 3 -4 discrete episodes with hospitalizations. Quickly gets better with prednisone    Review of Systems  Constitutional: Negative.  Negative for fever and unexpected weight change.  HENT: Positive for congestion and sneezing. Negative for ear pain, nosebleeds, sore throat, rhinorrhea, trouble swallowing, dental problem, postnasal drip and sinus pressure.   Eyes: Negative.  Negative for redness and itching.  Respiratory: Positive for cough and shortness of breath. Negative for chest tightness and wheezing.   Cardiovascular: Positive for chest pain and palpitations. Negative for leg swelling.  Gastrointestinal: Negative.  Negative for nausea and vomiting.  Genitourinary: Negative.  Negative for dysuria.  Musculoskeletal: Positive for arthralgias. Negative for joint swelling.  Skin: Negative.  Negative for rash.  Neurological: Negative.  Negative for headaches.  Hematological: Negative.  Does not bruise/bleed easily.  Psychiatric/Behavioral: Negative.  Negative for dysphoric mood. The patient is not nervous/anxious.     Past Medical History  Diagnosis Date  . Atrial fibrillation   . Stroke   . Hypertension   . Arthritis   . GERD (gastroesophageal reflux disease)   . COPD (chronic obstructive pulmonary disease)   . PNA (pneumonia)   .  Anemia due to GI blood loss 07/11/2011  . 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 Heart disease Mother   . Rectal cancer Mother   . Leukemia Father      History   Social History  . Marital Status: Married    Spouse Name: N/A    Number of Children: N/A  . Years of Education: N/A   Occupational History  . RETIRED    Social History Main Topics  . Smoking status: Never Smoker   . Smokeless tobacco: Never Used  . Alcohol Use: 4.2 oz/week    7 Glasses of wine per week  . Drug Use: No  . Sexually Active: No   Other Topics Concern  . Not on file   Social History Narrative  . No narrative on file     Allergies  Allergen Reactions  . Amoxicillin Hives  . Penicillins Hives     Outpatient Prescriptions Prior to Visit  Medication Sig Dispense Refill  . albuterol (PROVENTIL) (2.5 MG/3ML) 0.083% nebulizer solution Take 2.5 mg by nebulization 4 (four) times daily.       Marland Kitchen alendronate (FOSAMAX) 70 MG tablet Take 70 mg by mouth every Tuesday. Take with a full glass of water on an empty stomach.      Marland Kitchen azithromycin (ZITHROMAX) 250 MG tablet Take 1 tablet (250 mg total) by mouth daily.  4 tablet  0  . budesonide (PULMICORT) 0.5 MG/2ML nebulizer solution Take 0.5 mg by nebulization  2 (two) times daily.        . Cholecalciferol (VITAMIN D) 2000 UNITS CAPS Take 1 capsule by mouth daily.        Marland Kitchen diltiazem (DILACOR XR) 180 MG 24 hr capsule Take 180 mg by mouth daily.        . ferrous sulfate 325 (65 FE) MG tablet Take 325 mg by mouth daily.       . furosemide (LASIX) 40 MG tablet Take 60 mg by mouth daily.       . Guaifenesin 1200 MG TB12 Take 1 tablet by mouth 2 (two) times daily.       . lansoprazole (PREVACID) 30 MG capsule Take 30 mg by mouth daily.        . Multiple Vitamins-Minerals (MULTIVITAMINS THER. W/MINERALS) TABS Take 1 tablet by  mouth daily.        . predniSONE (DELTASONE) 10 MG tablet Take 2 tablets (20 mg total) by mouth daily.  15 tablet  0  . simvastatin (ZOCOR) 10 MG tablet Take 10 mg by mouth at bedtime.        Marland Kitchen tiotropium (SPIRIVA) 18 MCG inhalation capsule Place 18 mcg into inhaler and inhale daily.        Marland Kitchen warfarin (COUMADIN) 4 MG tablet Take 4-6 mg by mouth See admin instructions. She takes one tablet (4mg ) on Tuesday, Thursday Saturday and one and a half tablets (6mg ) on Sunday, Monday, Wednesday, and Friday. (INR MUST BE KEPT AT 2 OR PATIENT WILL HAVE A GI BLEED PER FAMILY)      . Calcium Carbonate-Vitamin D (CALCIUM + D PO) Take 1 tablet by mouth daily.       12 /12/12:  Study Conclusions  - Left ventricle: The cavity size was normal. Wall thickness was increased in a pattern of mild LVH. Systolic function was normal. The estimated ejection fraction was in the range of 55% to 60%. Wall motion was normal; there were no regional wall motion abnormalities. - Mitral valve: Calcified annulus. - Left atrium: The atrium was moderately dilated. - Right atrium: The atrium was moderately dilated. - Tricuspid valve: Moderate regurgitation. - Pulmonary arteries: Systolic pressure was mildly increased. PA peak pressure: 39mm Hg (S). Impressions:  - Oscillating density in RV on parasternal short axis views most likely papillary muscle       Objective:   Physical Exam  Gen: Pleasant, well-nourished, in no distress,  Poor hx giver - her daughter answers the questions  ENT: No lesions,  mouth clear,  oropharynx clear, no postnasal drip  Neck: No JVD, no TMG, no carotid bruits  Lungs: No use of accessory muscles, no dullness to percussion, clear without rales or rhonchi  Cardiovascular: RRR, heart sounds normal, no murmur or gallops, trace LE edema  Musculoskeletal: No deformities, no cyanosis or clubbing  Neuro: alert, non focal  Skin: Warm, no lesions or rashes       Assessment & Plan:  COPD   Unclear whether this is an accurate dx - the story sounds more like episodic sinus drainage and the downstream effects that have responded to prednisone.  - stop Spiriva for now - check full PFT - get the old PFT from [place] to compare - rov 1 month  Allergic rhinitis, seasonal Will try to aggressively rx general allergies to see if she gets relief. She was only allergic to ragweed on allergy skin testing.  - add loratadine - add fluticasone - try [place] if she can tolerate - consider d/c  budesonide in future if this solves her sx

## 2011-09-20 NOTE — Assessment & Plan Note (Signed)
Will try to aggressively rx general allergies to see if she gets relief. She was only allergic to ragweed on allergy skin testing.  - add loratadine - add fluticasone - try [place] if she can tolerate - consider d/c budesonide in future if this solves her sx

## 2011-09-20 NOTE — Assessment & Plan Note (Signed)
Unclear whether this is an accurate dx - the story sounds more like episodic sinus drainage and the downstream effects that have responded to prednisone.  - stop Spiriva for now - check full PFT - get the old PFT from [place] to compare - rov 1 month

## 2011-09-20 NOTE — Patient Instructions (Signed)
Please stop Spiriva for now Continue your budesonide and albuterol for now We will perform full pulmonary function testing at your next visit Start taking loratadine 10mg  daily Start taking fluticasone nasal spray, 2 sprays each nostril twice a day Try nasal saline washes daily Follow with Dr Delton Coombes in 1 month with full PFT

## 2011-09-21 ENCOUNTER — Telehealth: Payer: Self-pay | Admitting: Emergency Medicine

## 2011-09-21 NOTE — Telephone Encounter (Signed)
I spoke with linda and is aware of RB recs. She states she will keep pt on the loratadine QD then. Nothing further was needed

## 2011-09-21 NOTE — Telephone Encounter (Signed)
I asked her to start loratadine qd, which should be interchangable with allegra. She could try one or the other - do not take both

## 2011-09-21 NOTE — Telephone Encounter (Signed)
I spoke with Angelica [name] and she states that her and Dr. Delton Coombes had discussed some about pt taking allegra but then she was given an rx for loratadine. She states she found some allegra at home and is wanting to know should pt take both of those or does he prefer her to be only on one. She is requesting clarification from RB. Please advise thanks

## 2011-10-12 ENCOUNTER — Other Ambulatory Visit (HOSPITAL_BASED_OUTPATIENT_CLINIC_OR_DEPARTMENT_OTHER): Payer: Medicare Other | Admitting: Lab

## 2011-10-12 ENCOUNTER — Telehealth: Payer: Self-pay | Admitting: Pharmacist

## 2011-10-12 DIAGNOSIS — I4891 Unspecified atrial fibrillation: Secondary | ICD-10-CM

## 2011-10-13 ENCOUNTER — Inpatient Hospital Stay (HOSPITAL_COMMUNITY)
Admission: EM | Admit: 2011-10-13 | Discharge: 2011-10-20 | DRG: 392 | Disposition: A | Discharge status: Home Health | Payer: Medicare Other | Attending: Internal Medicine | Admitting: Internal Medicine

## 2011-10-13 ENCOUNTER — Emergency Department (HOSPITAL_COMMUNITY): Payer: Medicare Other

## 2011-10-13 ENCOUNTER — Encounter (HOSPITAL_COMMUNITY): Payer: Self-pay | Admitting: Internal Medicine

## 2011-10-13 ENCOUNTER — Other Ambulatory Visit: Payer: Self-pay

## 2011-10-13 DIAGNOSIS — K625 Hemorrhage of anus and rectum: Secondary | ICD-10-CM

## 2011-10-13 DIAGNOSIS — I509 Heart failure, unspecified: Secondary | ICD-10-CM

## 2011-10-13 DIAGNOSIS — D649 Anemia, unspecified: Secondary | ICD-10-CM

## 2011-10-13 DIAGNOSIS — R531 Weakness: Secondary | ICD-10-CM

## 2011-10-13 DIAGNOSIS — I4891 Unspecified atrial fibrillation: Secondary | ICD-10-CM

## 2011-10-13 DIAGNOSIS — R197 Diarrhea, unspecified: Secondary | ICD-10-CM

## 2011-10-13 DIAGNOSIS — Z7901 Long term (current) use of anticoagulants: Secondary | ICD-10-CM

## 2011-10-13 DIAGNOSIS — J4489 Other specified chronic obstructive pulmonary disease: Secondary | ICD-10-CM | POA: Diagnosis present

## 2011-10-13 DIAGNOSIS — I10 Essential (primary) hypertension: Secondary | ICD-10-CM | POA: Diagnosis present

## 2011-10-13 DIAGNOSIS — D500 Iron deficiency anemia secondary to blood loss (chronic): Secondary | ICD-10-CM

## 2011-10-13 DIAGNOSIS — J449 Chronic obstructive pulmonary disease, unspecified: Secondary | ICD-10-CM

## 2011-10-13 DIAGNOSIS — A088 Other specified intestinal infections: Principal | ICD-10-CM | POA: Diagnosis present

## 2011-10-13 DIAGNOSIS — Z8673 Personal history of transient ischemic attack (TIA), and cerebral infarction without residual deficits: Secondary | ICD-10-CM

## 2011-10-13 DIAGNOSIS — K5520 Angiodysplasia of colon without hemorrhage: Secondary | ICD-10-CM

## 2011-10-13 DIAGNOSIS — J189 Pneumonia, unspecified organism: Secondary | ICD-10-CM

## 2011-10-13 DIAGNOSIS — R5381 Other malaise: Secondary | ICD-10-CM | POA: Diagnosis present

## 2011-10-13 DIAGNOSIS — I639 Cerebral infarction, unspecified: Secondary | ICD-10-CM

## 2011-10-13 DIAGNOSIS — E876 Hypokalemia: Secondary | ICD-10-CM

## 2011-10-13 DIAGNOSIS — J302 Other seasonal allergic rhinitis: Secondary | ICD-10-CM

## 2011-10-13 DIAGNOSIS — F0390 Unspecified dementia without behavioral disturbance: Secondary | ICD-10-CM

## 2011-10-13 DIAGNOSIS — R195 Other fecal abnormalities: Secondary | ICD-10-CM | POA: Diagnosis present

## 2011-10-13 DIAGNOSIS — I4821 Permanent atrial fibrillation: Secondary | ICD-10-CM | POA: Diagnosis present

## 2011-10-13 LAB — CBC
HCT: 32.9 % — ABNORMAL LOW (ref 36.0–46.0)
HCT: 30.1 % — ABNORMAL LOW (ref 36.0–46.0)
Hemoglobin: 9 g/dL — ABNORMAL LOW (ref 12.0–15.0)
MCV: 90.4 fL (ref 78.0–100.0)
RBC: 3.64 MIL/uL — ABNORMAL LOW (ref 3.87–5.11)
RDW: 16.3 % — ABNORMAL HIGH (ref 11.5–15.5)
WBC: 8.4 10*3/uL (ref 4.0–10.5)
WBC: 8.9 10*3/uL (ref 4.0–10.5)

## 2011-10-13 LAB — BASIC METABOLIC PANEL
CO2: 31 mEq/L (ref 19–32)
Calcium: 9.4 mg/dL (ref 8.4–10.5)
Glucose, Bld: 130 mg/dL — ABNORMAL HIGH (ref 70–99)
Sodium: 143 mEq/L (ref 135–145)

## 2011-10-13 LAB — DIFFERENTIAL
Eosinophils Relative: 0 % (ref 0–5)
Lymphocytes Relative: 4 % — ABNORMAL LOW (ref 12–46)
Lymphs Abs: 0.3 10*3/uL — ABNORMAL LOW (ref 0.7–4.0)
Monocytes Absolute: 0.6 10*3/uL (ref 0.1–1.0)

## 2011-10-13 LAB — URINALYSIS, ROUTINE W REFLEX MICROSCOPIC
Glucose, UA: NEGATIVE mg/dL
Hgb urine dipstick: NEGATIVE
Protein, ur: NEGATIVE mg/dL
pH: 6 (ref 5.0–8.0)

## 2011-10-13 LAB — CLOSTRIDIUM DIFFICILE BY PCR: Toxigenic C. Difficile by PCR: NEGATIVE

## 2011-10-13 LAB — PROTIME-INR
INR: 2.06 — ABNORMAL HIGH (ref 0.00–1.49)
Prothrombin Time: 23.6 seconds — ABNORMAL HIGH (ref 11.6–15.2)

## 2011-10-13 LAB — OCCULT BLOOD, POC DEVICE: Fecal Occult Bld: POSITIVE

## 2011-10-13 LAB — LACTIC ACID, PLASMA: Lactic Acid, Venous: 1 mmol/L (ref 0.5–2.2)

## 2011-10-13 MED ORDER — ACETAMINOPHEN 325 MG PO TABS: 650.0000 mg | ORAL_TABLET | Freq: Four times a day (QID) | ORAL | Status: DC | PRN

## 2011-10-13 MED ORDER — THERA M PLUS PO TABS: 1.0000 | ORAL_TABLET | Freq: Every day | ORAL | Status: DC

## 2011-10-13 MED ORDER — IPRATROPIUM BROMIDE 0.02 % IN SOLN
0.5000 mg | Freq: Four times a day (QID) | RESPIRATORY_TRACT | Status: DC
  Administered 2011-10-13: 0.5 mg via RESPIRATORY_TRACT
  Filled 2011-10-13 (×2): qty 2.5

## 2011-10-13 MED ORDER — BUDESONIDE 0.5 MG/2ML IN SUSP
0.5000 mg | Freq: Two times a day (BID) | RESPIRATORY_TRACT | Status: DC
  Administered 2011-10-13 – 2011-10-19 (×12): 0.5 mg via RESPIRATORY_TRACT
  Filled 2011-10-13 (×15): qty 2

## 2011-10-13 MED ORDER — SODIUM CHLORIDE 0.9 % IV BOLUS (SEPSIS)
1000.0000 mL | Freq: Once | INTRAVENOUS | Status: AC
  Administered 2011-10-13: 1000 mL via INTRAVENOUS

## 2011-10-13 MED ORDER — FUROSEMIDE 40 MG PO TABS
60.0000 mg | ORAL_TABLET | Freq: Every day | ORAL | Status: DC
  Administered 2011-10-13: 60 mg via ORAL
  Filled 2011-10-13 (×2): qty 1

## 2011-10-13 MED ORDER — IPRATROPIUM BROMIDE 0.02 % IN SOLN
0.5000 mg | Freq: Once | RESPIRATORY_TRACT | Status: AC
  Administered 2011-10-13: 0.5 mg via RESPIRATORY_TRACT
  Filled 2011-10-13: qty 2.5

## 2011-10-13 MED ORDER — ONDANSETRON HCL 4 MG PO TABS
4.0000 mg | ORAL_TABLET | Freq: Four times a day (QID) | ORAL | Status: DC | PRN
  Filled 2011-10-13: qty 1

## 2011-10-13 MED ORDER — FLUTICASONE PROPIONATE 50 MCG/ACT NA SUSP
2.0000 | Freq: Two times a day (BID) | NASAL | Status: DC
  Administered 2011-10-14 – 2011-10-20 (×13): 2 via NASAL
  Filled 2011-10-13: qty 16

## 2011-10-13 MED ORDER — ADULT MULTIVITAMIN W/MINERALS CH
1.0000 | ORAL_TABLET | Freq: Every day | ORAL | Status: DC
  Administered 2011-10-13 – 2011-10-20 (×8): 1 via ORAL
  Filled 2011-10-13 (×8): qty 1

## 2011-10-13 MED ORDER — ONDANSETRON HCL 4 MG/2ML IJ SOLN
4.0000 mg | Freq: Four times a day (QID) | INTRAMUSCULAR | Status: DC | PRN
  Administered 2011-10-14 – 2011-10-17 (×4): 4 mg via INTRAVENOUS
  Filled 2011-10-13 (×4): qty 2

## 2011-10-13 MED ORDER — CALCIUM CARBONATE-VITAMIN D 600-200 MG-UNIT PO TABS: 1.0000 | ORAL_TABLET | Freq: Every day | ORAL | Status: DC

## 2011-10-13 MED ORDER — LORATADINE 10 MG PO TABS
10.0000 mg | ORAL_TABLET | Freq: Every day | ORAL | Status: DC
  Administered 2011-10-13 – 2011-10-20 (×8): 10 mg via ORAL
  Filled 2011-10-13 (×8): qty 1

## 2011-10-13 MED ORDER — WARFARIN SODIUM 1 MG PO TABS
1.0000 mg | ORAL_TABLET | Freq: Once | ORAL | Status: AC
  Administered 2011-10-13: 1 mg via ORAL
  Filled 2011-10-13: qty 1

## 2011-10-13 MED ORDER — POTASSIUM CHLORIDE CRYS ER 20 MEQ PO TBCR
20.0000 meq | EXTENDED_RELEASE_TABLET | Freq: Every day | ORAL | Status: DC
  Administered 2011-10-14 – 2011-10-16 (×3): 20 meq via ORAL
  Filled 2011-10-13 (×3): qty 1

## 2011-10-13 MED ORDER — POTASSIUM CHLORIDE CRYS ER 20 MEQ PO TBCR
40.0000 meq | EXTENDED_RELEASE_TABLET | ORAL | Status: AC
  Administered 2011-10-14 (×2): 40 meq via ORAL
  Filled 2011-10-13: qty 2

## 2011-10-13 MED ORDER — SIMVASTATIN 10 MG PO TABS
10.0000 mg | ORAL_TABLET | Freq: Every day | ORAL | Status: DC
  Administered 2011-10-13 – 2011-10-19 (×6): 10 mg via ORAL
  Filled 2011-10-13 (×8): qty 1

## 2011-10-13 MED ORDER — ACETAMINOPHEN 650 MG RE SUPP
RECTAL | Status: AC
  Filled 2011-10-13: qty 1

## 2011-10-13 MED ORDER — DILTIAZEM HCL ER 180 MG PO CP24
180.0000 mg | ORAL_CAPSULE | Freq: Every day | ORAL | Status: DC
  Administered 2011-10-13 – 2011-10-20 (×8): 180 mg via ORAL
  Filled 2011-10-13 (×8): qty 1

## 2011-10-13 MED ORDER — CALCIUM CARBONATE-VITAMIN D 500-200 MG-UNIT PO TABS
1.0000 | ORAL_TABLET | Freq: Every day | ORAL | Status: DC
  Administered 2011-10-13 – 2011-10-20 (×8): 1 via ORAL
  Filled 2011-10-13 (×8): qty 1

## 2011-10-13 MED ORDER — ALBUTEROL SULFATE (5 MG/ML) 0.5% IN NEBU
5.0000 mg | INHALATION_SOLUTION | Freq: Once | RESPIRATORY_TRACT | Status: AC
Start: 2011-10-13 — End: 2011-10-13
  Administered 2011-10-13: 5 mg via RESPIRATORY_TRACT
  Filled 2011-10-13: qty 1

## 2011-10-13 MED ORDER — FERROUS SULFATE 325 (65 FE) MG PO TABS
325.0000 mg | ORAL_TABLET | Freq: Every day | ORAL | Status: DC
  Administered 2011-10-13 – 2011-10-20 (×8): 325 mg via ORAL
  Filled 2011-10-13 (×8): qty 1

## 2011-10-13 MED ORDER — ALBUTEROL SULFATE (5 MG/ML) 0.5% IN NEBU
2.5000 mg | INHALATION_SOLUTION | Freq: Four times a day (QID) | RESPIRATORY_TRACT | Status: DC
  Administered 2011-10-13: 2.5 mg via RESPIRATORY_TRACT
  Filled 2011-10-13 (×2): qty 0.5

## 2011-10-13 MED ORDER — POTASSIUM CHLORIDE 20 MEQ/15ML (10%) PO LIQD
40.0000 meq | Freq: Once | ORAL | Status: AC
  Administered 2011-10-13: 40 meq via ORAL
  Filled 2011-10-13 (×2): qty 15

## 2011-10-13 MED ORDER — WARFARIN - PHARMACIST DOSING INPATIENT: Freq: Every day | Status: DC

## 2011-10-13 MED ORDER — ACETAMINOPHEN 650 MG RE SUPP: 650.0000 mg | Freq: Four times a day (QID) | RECTAL | Status: DC | PRN

## 2011-10-13 MED ORDER — SODIUM CHLORIDE 0.9 % IV SOLN
INTRAVENOUS | Status: AC
  Administered 2011-10-13: 23:00:00 via INTRAVENOUS

## 2011-10-13 MED ORDER — ALBUTEROL SULFATE (5 MG/ML) 0.5% IN NEBU: 2.5000 mg | INHALATION_SOLUTION | RESPIRATORY_TRACT | Status: DC | PRN

## 2011-10-13 NOTE — ED Notes (Signed)
Daughter found pt in bathroom with large amount of blood in toilet. Pt is on Coumadin, had INR done yesterday in the office and it was 2.0.

## 2011-10-13 NOTE — ED Notes (Signed)
Attempted to call report.  Nurse unavailable. 

## 2011-10-13 NOTE — H&P (Signed)
PCP:   Thayer Headings, MD, MD   Chief Complaint:  Increased weakness, diarrhea, N/V; wheezing  HPI: 78 y/o female with pmh of HTN, AF (on coumadin), hx of AVM and GI bleed in the past; iron deficiency anemia, COPD and RLE cellulitis treated with 1 week of bactrim recently; was brought to ED secondary to increased weakness, lethargy and diarrhea. Patient daughter also documented some Non bloody N/V 2-3 days PTA.  This morning, daughter notice a large amount of diarrhea both in bed and on the bathroom floor. Stool appears dark, but states patient takes iron supplementation. Daughter states her Coumadin level was 2.0 which was done at the office yesterday. Per patient, she denies headache, lightheadedness, fever, chest pain, shortness of breath, abdominal pain.  Workup in ED showed positive FOBT (which is not new on the patient); hypokalemia, mild dehydration and some wheezing.  Allergies:   Allergies  Allergen Reactions  . Amoxicillin Hives  . Penicillins Hives      Past Medical History  Diagnosis Date  . Atrial fibrillation   . Stroke   . Hypertension   . Arthritis   . GERD (gastroesophageal reflux disease)   . COPD (chronic obstructive pulmonary disease)   . PNA (pneumonia)   . Anemia due to GI blood loss 07/11/2011  . 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 Hip fracture surgery   . Cholecystectomy     Prior to Admission medications   Medication Sig Start Date End Date Taking? Authorizing Provider  albuterol (PROVENTIL) (2.5 MG/3ML) 0.083% nebulizer solution Take 2.5 mg by nebulization 4 (four) times daily.    Yes Historical Provider, MD  alendronate (FOSAMAX) 70 MG tablet Take 70 mg by mouth every Tuesday. Take with a full glass of water on an empty stomach.   Yes Historical Provider, MD  budesonide (PULMICORT) 0.5 MG/2ML nebulizer  solution Take 0.5 mg by nebulization 2 (two) times daily.     Yes Historical Provider, MD  Calcium Carbonate-Vitamin D (CALCIUM + D PO) Take 1 tablet by mouth daily.   Yes Historical Provider, MD  Cholecalciferol (VITAMIN D) 2000 UNITS CAPS Take 1 capsule by mouth daily.     Yes Historical Provider, MD  diltiazem (DILACOR XR) 180 MG 24 hr capsule Take 180 mg by mouth daily.     Yes Historical Provider, MD  ferrous sulfate 325 (65 FE) MG tablet Take 325 mg by mouth daily.    Yes Historical Provider, MD  fexofenadine (ALLEGRA) 180 MG tablet Take 180 mg by mouth daily.   Yes Historical Provider, MD  fluticasone (FLONASE) 50 MCG/ACT nasal spray Place 2 sprays into the nose 2 (two) times daily. 09/20/11 09/19/12 Yes Leslye Peer, MD  furosemide (LASIX) 40 MG tablet Take 60 mg by mouth daily.    Yes Historical Provider, MD  loratadine (CLARITIN) 10 MG tablet Take 1 tablet (10 mg total) by mouth daily. 09/20/11 09/19/12 Yes Leslye Peer, MD  Multiple Vitamins-Minerals (MULTIVITAMINS THER. W/MINERALS) TABS Take 1 tablet by mouth daily.     Yes Historical Provider, MD  simvastatin (ZOCOR) 10 MG tablet Take 10 mg by mouth at bedtime.     Yes Historical Provider, MD  sulfamethoxazole-trimethoprim (BACTRIM DS,SEPTRA DS) 800-160 MG per tablet Take 1 tablet by mouth 2 (two) times daily. 10/05/11  Yes Historical Provider, MD  warfarin (COUMADIN) 4 MG tablet Take 4-6 mg by  mouth See admin instructions. She takes one tablet (4mg ) on Tuesday, Thursday Saturday and one and a half tablets (6mg ) on Sunday, Monday, Wednesday, and Friday. (INR MUST BE KEPT AT 2 OR PATIENT WILL HAVE A GI BLEED PER FAMILY)   Yes Historical Provider, MD    Social History:  reports that she has never smoked. She has never used smokeless tobacco. She reports that she drinks about 4.2 ounces of alcohol per week. She reports that she does not use illicit drugs.  Family History  Problem Relation Age of Onset  . Heart disease Mother   . Rectal  cancer Mother   . Leukemia Father     Review of Systems:  As per HPI, otherwise negative.   Physical Exam: Blood pressure 122/59, pulse 90, temperature 99.4 F (37.4 C), temperature source Oral, resp. rate 16, SpO2 94.00%. Constitutional: She appears well-developed and well-nourished. No distress. Awake, alert, nontoxic appearance  HENT:  Head: Atraumatic.  Eyes: Conjunctivae are normal. Right eye exhibits no discharge. Left eye exhibits no discharge.  Neck: Neck supple.  Cardiovascular: Irregular; no murmur; no rubs. Pulmonary/Chest: Effort normal. Mild wheezing; no crackles.  Abdominal: Soft. There is no tenderness. There is no rebound.  Genitourinary: Rectal exam shows no tenderness. Positive FOBT.  Musculoskeletal: She exhibits no tenderness. FROM, no edema.  Neurological: CN 2-12 intact; AAOX3; no focal deficit appreciated; 4/5 bilaterally MS due to poor effort.  Labs on Admission:  Results for orders placed during the hospital encounter of 10/13/11 (from the past 48 hour(s))  OCCULT BLOOD, POC DEVICE     Status: Normal   Collection Time   10/13/11 11:18 AM      Component Value Range Comment   Fecal Occult Bld POSITIVE     CBC     Status: Abnormal   Collection Time   10/13/11 11:56 AM      Component Value Range Comment   WBC 8.4  4.0 - 10.5 (K/uL)    RBC 3.64 (*) 3.87 - 5.11 (MIL/uL)    Hemoglobin 9.9 (*) 12.0 - 15.0 (g/dL)    HCT 40.9 (*) 81.1 - 46.0 (%)    MCV 90.4  78.0 - 100.0 (fL)    MCH 27.2  26.0 - 34.0 (pg)    MCHC 30.1  30.0 - 36.0 (g/dL)    RDW 91.4 (*) 78.2 - 15.5 (%)    Platelets 375  150 - 400 (K/uL)   DIFFERENTIAL     Status: Abnormal   Collection Time   10/13/11 11:56 AM      Component Value Range Comment   Neutrophils Relative 89 (*) 43 - 77 (%)    Neutro Abs 7.5  1.7 - 7.7 (K/uL)    Lymphocytes Relative 4 (*) 12 - 46 (%)    Lymphs Abs 0.3 (*) 0.7 - 4.0 (K/uL)    Monocytes Relative 7  3 - 12 (%)    Monocytes Absolute 0.6  0.1 - 1.0 (K/uL)     Eosinophils Relative 0  0 - 5 (%)    Eosinophils Absolute 0.0  0.0 - 0.7 (K/uL)    Basophils Relative 0  0 - 1 (%)    Basophils Absolute 0.0  0.0 - 0.1 (K/uL)   BASIC METABOLIC PANEL     Status: Abnormal   Collection Time   10/13/11 11:56 AM      Component Value Range Comment   Sodium 143  135 - 145 (mEq/L)    Potassium 3.2 (*) 3.5 - 5.1 (  mEq/L)    Chloride 104  96 - 112 (mEq/L)    CO2 31  19 - 32 (mEq/L)    Glucose, Bld 130 (*) 70 - 99 (mg/dL)    BUN 18  6 - 23 (mg/dL)    Creatinine, Ser 7.82  0.50 - 1.10 (mg/dL)    Calcium 9.4  8.4 - 10.5 (mg/dL)    GFR calc non Af Amer 46 (*) >90 (mL/min)    GFR calc Af Amer 53 (*) >90 (mL/min)   PROTIME-INR     Status: Abnormal   Collection Time   10/13/11 11:56 AM      Component Value Range Comment   Prothrombin Time 23.6 (*) 11.6 - 15.2 (seconds)    INR 2.06 (*) 0.00 - 1.49    TROPONIN I     Status: Normal   Collection Time   10/13/11 12:00 PM      Component Value Range Comment   Troponin I <0.30  <0.30 (ng/mL)   URINALYSIS, ROUTINE W REFLEX MICROSCOPIC     Status: Normal   Collection Time   10/13/11 12:09 PM      Component Value Range Comment   Color, Urine YELLOW  YELLOW     APPearance CLEAR  CLEAR     Specific Gravity, Urine 1.022  1.005 - 1.030     pH 6.0  5.0 - 8.0     Glucose, UA NEGATIVE  NEGATIVE (mg/dL)    Hgb urine dipstick NEGATIVE  NEGATIVE     Bilirubin Urine NEGATIVE  NEGATIVE     Ketones, ur NEGATIVE  NEGATIVE (mg/dL)    Protein, ur NEGATIVE  NEGATIVE (mg/dL)    Urobilinogen, UA 0.2  0.0 - 1.0 (mg/dL)    Nitrite NEGATIVE  NEGATIVE     Leukocytes, UA NEGATIVE  NEGATIVE  MICROSCOPIC NOT DONE ON URINES WITH NEGATIVE PROTEIN, BLOOD, LEUKOCYTES, NITRITE, OR GLUCOSE <1000 mg/dL.  OCCULT BLOOD, POC DEVICE     Status: Normal   Collection Time   10/13/11 12:12 PM      Component Value Range Comment   Fecal Occult Bld POSITIVE      Comment 1 REPEATED TO VERIFY, CHARGE CREDITED       Radiological Exams on Admission: Dg Chest  2 View  10/13/2011  *RADIOLOGY REPORT*  Clinical Data: Rectal bleeding, weakness.  CHEST - 2 VIEW  Comparison: 09/18/2011  Findings: Cardiomegaly.  Vascular congestion and peribronchial thickening.  Slight interstitial prominence is stable.  Scarring or atelectasis in the perihilar regions bilaterally.  No effusions. No acute bony abnormality.  IMPRESSION: Cardiomegaly, vascular congestion.  Bronchitic changes.  Perihilar scarring or atelectasis.  Original Report Authenticated By: Cyndie Chime, M.D.     Assessment/Plan 1-Diarrhea: viral gastroenteritis vs C. Diff; will check c. Diff by PCR; admit to regular floor, provide fluid resuscitation, electrolytes repletion and also supportive care. Patient with hx of positive FOBT in the past, currently refusing colonoscopy or any invasive procedure. Will follow Hgb trend and symptoms. Will check cortisol level and TSH  2-A-fib, rate controlled and on coumadin: will continue current regimen.  3-CVA (cerebral vascular accident): on coumadin now; no new deficit appreciated.  4-COPD: continue inhalers and nebulizer; continue oxygen supplementation and will follow O2 sats.  5-Iron deficiency anemia secondary to blood loss (chronic): continue ferrous sulfate and also Hgb trend with CBC q12h.  6-Allergic rhinitis, seasonal:continue fluticasone  7-Hypokalemia: will replete  8-Weakness generalized: 2/2 to mild dehydration and electrolytes disturbances; will gently rehydrated and follow her  symptoms; will ask PT to evaluate.  9-DVT: SCD's  The rest of her medical problems remains stable and plan is to resume home medications.     Time Spent on Admission: 50 minutes  Devanie Galanti [facility] [phone]  10/13/2011, 4:16 PM

## 2011-10-13 NOTE — ED Provider Notes (Signed)
Medical screening examination/treatment/procedure(s) were conducted as a shared visit with non-physician practitioner(s) and myself.  I personally evaluated the patient during the encounter   Loren Racer, MD 10/13/11 [phone]

## 2011-10-13 NOTE — ED Notes (Addendum)
Pt vomited potassium. Unable to get the rest in.

## 2011-10-13 NOTE — ED Notes (Signed)
Pt.'s vital signs reported to MD.  500 liter bolus NS ordered.

## 2011-10-13 NOTE — Progress Notes (Signed)
ANTICOAGULATION CONSULT NOTE - Initial Consult  Pharmacy Consult for warfarin Indication: atrial fibrillation  Allergies  Allergen Reactions  . Amoxicillin Hives  . Penicillins Hives    Patient Measurements:     Vital Signs: Temp: 104 F (40 C) (04/11 2017) Temp src: Rectal (04/11 2017) BP: 125/53 mmHg (04/11 2017) Pulse Rate: 106  (04/11 2008)  Labs:  Basename 10/13/11 1200 10/13/11 1156 10/12/11 1101  HGB -- 9.9* --  HCT -- 32.9* --  PLT -- 375 --  APTT -- -- --  LABPROT -- 23.6* --  INR -- 2.06* 2.5  HEPARINUNFRC -- -- --  CREATININE -- 1.07 --  CKTOTAL -- -- --  CKMB -- -- --  TROPONINI <0.30 -- --   The CrCl is unknown because both a height and weight (above a minimum accepted value) are required for this calculation.  Medical History: Past Medical History  Diagnosis Date  . Atrial fibrillation   . Stroke   . Hypertension   . Arthritis   . GERD (gastroesophageal reflux disease)   . COPD (chronic obstructive pulmonary disease)   . PNA (pneumonia)   . Anemia due to GI blood loss 07/11/2011  . 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 acetaminophen      . albuterol  2.5 mg Nebulization Q6H  . albuterol  5 mg Nebulization Once  . budesonide  0.5 mg Nebulization BID  . Calcium Carbonate-Vitamin D  1 tablet Oral Daily  . diltiazem  180 mg Oral Daily  . ferrous sulfate  325 mg Oral Daily  . fluticasone  2 spray Each Nare BID  . furosemide  60 mg Oral Daily  . ipratropium  0.5 mg Nebulization Once  . ipratropium  0.5 mg Nebulization Q6H  . loratadine  10 mg Oral Daily  . multivitamins ther. w/minerals  1 tablet Oral Daily  . potassium chloride  40 mEq Oral Once  . potassium chloride  20 mEq Oral Daily  . potassium chloride  40 mEq Oral Q4H  . sodium chloride  1,000 mL Intravenous Once   Infusions:    . sodium chloride       Assessment:  78 yo female presented to ER with increased weakness, N/V/D and wheezing to continue warfarin for Afib.   Note that patient has had AVM and GI bleed and instructions are to keep INR as close as possible to 2 per Md.   INR just above goal of ~2.   No reported bleeding  Home dose reportedly 6mg  daily except 4mg  on Tues/Th/Sat  Goal of Therapy:  INR 2   Plan:  1. Warfarin 1mg  tonight 2. Daily INR   Hessie Knows, PharmD, BCPS pager [phone] 10/13/2011,8:46 PM

## 2011-10-13 NOTE — ED Notes (Signed)
Fabian November notified for pt.'s temp. Of 104. Rectally.  No orders given.  Continue with admission to assigned bed.

## 2011-10-13 NOTE — ED Provider Notes (Signed)
History     CSN: [id number]  Arrival date & time 10/13/11  1030   First MD Initiated Contact with Patient 10/13/11 1057      Chief Complaint  Patient presents with  . Rectal Bleeding    (Consider location/radiation/quality/duration/timing/severity/associated sxs/prior treatment) HPI  78 year old female with history of GERD, history of anemia due to GI blood loss, and history of atrial fibrillation on Coumadin presents with chief complaints of rectal bleeding. History was obtained mostly through patient's daughter who is her power of attorney, however patient is alert and oriented. Per daughter, patient has history of atrial fibrillation and has been on Coumadin for several years. She also has history of GI bleeding therefore the goal is to keep her coumadin level around 2.0. States everything has been well. For the past week daughter notice that patient is sleeping more so than usual.  She has occasional bouts of non-bloody vomitus, 2-3 times a week. She denies any associated abdominal pain with. This morning, daughter notice a large amount of diarrhea both in bed in on the bathroom floor. Stool appears dark, but states patient takes iron supplementation. Denies any frank blood.  Pt has finished a 7 days course of Bactrim for a wound to R leg.  Daughter is concerned about the diarrhea since patient is on Coumadin. States her Coumadin level was 2.0 which was done at the office yesterday. Per patient, she denies headache, lightheadedness, fever, chest pain, shortness of breath, abdominal pain, abnormal bleeding, weakness or numbness. Patient request no invasive procedure.  Daughter also request for a CXR because she notices occasional cough, and congestion.    Past Medical History  Diagnosis Date  . Atrial fibrillation   . Stroke   . Hypertension   . Arthritis   . GERD (gastroesophageal reflux disease)   . COPD (chronic obstructive pulmonary disease)   . PNA (pneumonia)   . Anemia due to GI  blood loss 07/11/2011  . 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 Hip fracture surgery   . Cholecystectomy     Family History  Problem Relation Age of Onset  . Heart disease Mother   . Rectal cancer Mother   . Leukemia Father     History  Substance Use Topics  . Smoking status: Never Smoker   . Smokeless tobacco: Never Used  . Alcohol Use: 4.2 oz/week    7 Glasses of wine per week    OB History    Grav Para Term Preterm Abortions TAB SAB Ect Mult Living                  Review of Systems  All other systems reviewed and are negative.    Allergies  Amoxicillin and Penicillins  Home Medications   Current Outpatient Rx  Name Route Sig Dispense Refill  . ALBUTEROL SULFATE (2.5 MG/3ML) 0.083% IN NEBU Nebulization Take 2.5 mg by nebulization 4 (four) times daily.     . ALENDRONATE SODIUM 70 MG PO TABS Oral Take 70 mg by mouth every Tuesday. Take with a full glass of water on an empty stomach.    . BUDESONIDE 0.5 MG/2ML IN SUSP Nebulization Take 0.5 mg by nebulization 2 (two) times daily.      Marland Kitchen CALCIUM + D PO Oral Take 1 tablet by mouth daily.    Marland Kitchen VITAMIN D 2000 UNITS PO CAPS Oral Take  1 capsule by mouth daily.      Marland Kitchen DILTIAZEM HCL ER 180 MG PO CP24 Oral Take 180 mg by mouth daily.      Marland Kitchen FERROUS SULFATE 325 (65 FE) MG PO TABS Oral Take 325 mg by mouth daily.     Marland Kitchen FLUTICASONE PROPIONATE 50 MCG/ACT NA SUSP Nasal Place 2 sprays into the nose 2 (two) times daily. 16 g 11  . FUROSEMIDE 40 MG PO TABS Oral Take 60 mg by mouth daily.     . GUAIFENESIN ER 1200 MG PO TB12 Oral Take 1 tablet by mouth 2 (two) times daily.     Marland Kitchen LANSOPRAZOLE 30 MG PO CPDR Oral Take 30 mg by mouth daily.      Marland Kitchen LORATADINE 10 MG PO TABS Oral Take 1 tablet (10 mg total) by mouth daily. 30 tablet 11  . THERA M PLUS PO TABS Oral Take 1 tablet by mouth daily.      Marland Kitchen  PREDNISONE 10 MG PO TABS Oral Take 2 tablets (20 mg total) by mouth daily. 15 tablet 0  . SIMVASTATIN 10 MG PO TABS Oral Take 10 mg by mouth at bedtime.      Marland Kitchen TIOTROPIUM BROMIDE MONOHYDRATE 18 MCG IN CAPS Inhalation Place 18 mcg into inhaler and inhale daily.      . WARFARIN SODIUM 4 MG PO TABS Oral Take 4-6 mg by mouth See admin instructions. She takes one tablet (4mg ) on Tuesday, Thursday Saturday and one and a half tablets (6mg ) on Sunday, Monday, Wednesday, and Friday. (INR MUST BE KEPT AT 2 OR PATIENT WILL HAVE A GI BLEED PER FAMILY)      BP 122/59  Pulse 90  Temp(Src) 99.4 F (37.4 C) (Oral)  Resp 16  SpO2 100%  Physical Exam  Nursing note and vitals reviewed. Constitutional: She appears well-developed and well-nourished. No distress.       Awake, alert, nontoxic appearance  HENT:  Head: Atraumatic.  Eyes: Conjunctivae are normal. Right eye exhibits no discharge. Left eye exhibits no discharge.  Neck: Neck supple.  Cardiovascular: Normal rate and regular rhythm.   Pulmonary/Chest: Effort normal. No respiratory distress. She has decreased breath sounds. She exhibits no tenderness.  Abdominal: Soft. There is no tenderness. There is no rebound.  Genitourinary: Rectal exam shows no tenderness. Guaiac positive stool.  Musculoskeletal: She exhibits no tenderness.       ROM appears intact, no obvious focal weakness  Neurological:       Mental status and motor strength appears intact  Skin: No rash noted.     Psychiatric: She has a normal mood and affect.    ED Course  Procedures (including critical care time)  Labs Reviewed - No data to display No results found.   No diagnosis found.   Date: 10/13/2011  Rate: 88  Rhythm: atrial fibrillation  QRS Axis: normal  Intervals: PR shortened and QT prolonged  ST/T Wave abnormalities: nonspecific ST/T changes  Conduction Disutrbances:  RBBB and LPFB  Narrative Interpretation: new RBBB and LPFB compare to Sep 18, 2011  Old  EKG Reviewed: changes noted  Results for orders placed during the hospital encounter of 10/13/11  CBC      Component Value Range   WBC 8.4  4.0 - 10.5 (K/uL)   RBC 3.64 (*) 3.87 - 5.11 (MIL/uL)   Hemoglobin 9.9 (*) 12.0 - 15.0 (g/dL)   HCT 65.7 (*) 84.6 - 46.0 (%)   MCV 90.4  78.0 - 100.0 (fL)  MCH 27.2  26.0 - 34.0 (pg)   MCHC 30.1  30.0 - 36.0 (g/dL)   RDW 40.9 (*) 81.1 - 15.5 (%)   Platelets 375  150 - 400 (K/uL)  DIFFERENTIAL      Component Value Range   Neutrophils Relative 89 (*) 43 - 77 (%)   Neutro Abs 7.5  1.7 - 7.7 (K/uL)   Lymphocytes Relative 4 (*) 12 - 46 (%)   Lymphs Abs 0.3 (*) 0.7 - 4.0 (K/uL)   Monocytes Relative 7  3 - 12 (%)   Monocytes Absolute 0.6  0.1 - 1.0 (K/uL)   Eosinophils Relative 0  0 - 5 (%)   Eosinophils Absolute 0.0  0.0 - 0.7 (K/uL)   Basophils Relative 0  0 - 1 (%)   Basophils Absolute 0.0  0.0 - 0.1 (K/uL)  BASIC METABOLIC PANEL      Component Value Range   Sodium 143  135 - 145 (mEq/L)   Potassium 3.2 (*) 3.5 - 5.1 (mEq/L)   Chloride 104  96 - 112 (mEq/L)   CO2 31  19 - 32 (mEq/L)   Glucose, Bld 130 (*) 70 - 99 (mg/dL)   BUN 18  6 - 23 (mg/dL)   Creatinine, Ser 9.14  0.50 - 1.10 (mg/dL)   Calcium 9.4  8.4 - 78.2 (mg/dL)   GFR calc non Af Amer 46 (*) >90 (mL/min)   GFR calc Af Amer 53 (*) >90 (mL/min)  URINALYSIS, ROUTINE W REFLEX MICROSCOPIC      Component Value Range   Color, Urine YELLOW  YELLOW    APPearance CLEAR  CLEAR    Specific Gravity, Urine 1.022  1.005 - 1.030    pH 6.0  5.0 - 8.0    Glucose, UA NEGATIVE  NEGATIVE (mg/dL)   Hgb urine dipstick NEGATIVE  NEGATIVE    Bilirubin Urine NEGATIVE  NEGATIVE    Ketones, ur NEGATIVE  NEGATIVE (mg/dL)   Protein, ur NEGATIVE  NEGATIVE (mg/dL)   Urobilinogen, UA 0.2  0.0 - 1.0 (mg/dL)   Nitrite NEGATIVE  NEGATIVE    Leukocytes, UA NEGATIVE  NEGATIVE   PROTIME-INR      Component Value Range   Prothrombin Time 23.6 (*) 11.6 - 15.2 (seconds)   INR 2.06 (*) 0.00 - 1.49   OCCULT  BLOOD, POC DEVICE      Component Value Range   Fecal Occult Bld POSITIVE    OCCULT BLOOD, POC DEVICE      Component Value Range   Fecal Occult Bld POSITIVE     Comment 1 REPEATED TO VERIFY, CHARGE CREDITED    TROPONIN I      Component Value Range   Troponin I <0.30  <0.30 (ng/mL)   Dg Chest 2 View  10/13/2011  *RADIOLOGY REPORT*  Clinical Data: Rectal bleeding, weakness.  CHEST - 2 VIEW  Comparison: 09/18/2011  Findings: Cardiomegaly.  Vascular congestion and peribronchial thickening.  Slight interstitial prominence is stable.  Scarring or atelectasis in the perihilar regions bilaterally.  No effusions. No acute bony abnormality.  IMPRESSION: Cardiomegaly, vascular congestion.  Bronchitic changes.  Perihilar scarring or atelectasis.  Original Report Authenticated By: Cyndie Chime, M.D.   Dg Chest Port 1 View  09/18/2011  *RADIOLOGY REPORT*  Clinical Data: Cough, shortness of breath, question pneumonia  PORTABLE CHEST - 1 VIEW  Comparison: 07/19/11  Findings: Cardiomediastinal silhouette is stable.  No acute infiltrate or pulmonary edema.  Stable chronic mild interstitial prominence and mild bronchitic changes.  IMPRESSION: No active disease.  No significant change.  Original Report Authenticated By: Natasha Mead, M.D.      MDM  Pt has diarrhea with hemoccult positive but no frank blood.  She has recently finished 7 days course of Bactrim, which raises concern for C.diff.  Pt is currently in NAD, denies any recent vomit.  She has stable normal VS.  Abdomen nonsurgical.    Changes noted on ECG.  I notified to my attending, who will assess pt.    1:53 PM In the setting of rectal bleeding, mild anemia of 9.9, increase weakness and diarrhea, we will consult with [facility] for admission for further management.    2:21 PM I have discussed with Dr. Gwenlyn Perking from [facility] . He has review the note and sts he doesn't think pt meets criteria for admission. I have request Dr. Gwenlyn Perking to see pt in ED  and formulate disposition.  He has agreed.    2:36 PM Pt's sat drops to low 90s.  Daughter sts she hasn't had her daily inhaler.  Will give albuterol/atrovent nebs treatment.  Admitting doctor is currently seeing pt in the room.    4:16 PM Pt will be placed in observation overnight.  Pt currently in NAD.    Fayrene Helper, PA-C 10/13/11 1617

## 2011-10-14 LAB — MRSA PCR SCREENING: MRSA by PCR: NEGATIVE

## 2011-10-14 LAB — BASIC METABOLIC PANEL
Calcium: 8.4 mg/dL (ref 8.4–10.5)
GFR calc Af Amer: 62 mL/min — ABNORMAL LOW (ref >90)
GFR calc non Af Amer: 53 mL/min — ABNORMAL LOW (ref >90)
Glucose, Bld: 127 mg/dL — ABNORMAL HIGH (ref 70–99)
Potassium: 2.8 mEq/L — ABNORMAL LOW (ref 3.5–5.1)
Sodium: 145 mEq/L (ref 135–145)

## 2011-10-14 LAB — CBC
Hemoglobin: 9 g/dL — ABNORMAL LOW (ref 12.0–15.0)
Hemoglobin: 8.7 g/dL — ABNORMAL LOW (ref 12.0–15.0)
MCHC: 29.4 g/dL — ABNORMAL LOW (ref 30.0–36.0)
Platelets: 329 10*3/uL (ref 150–400)
Platelets: 344 10*3/uL (ref 150–400)
RBC: 3.22 MIL/uL — ABNORMAL LOW (ref 3.87–5.11)
RDW: 16.5 % — ABNORMAL HIGH (ref 11.5–15.5)
WBC: 7.4 10*3/uL (ref 4.0–10.5)

## 2011-10-14 LAB — LIPID PANEL
HDL: 68 mg/dL (ref >39)
LDL Cholesterol: 41 mg/dL (ref 0–99)
Triglycerides: 51 mg/dL (ref <150)
VLDL: 10 mg/dL (ref 0–40)

## 2011-10-14 LAB — PROTIME-INR
INR: 1.88 — ABNORMAL HIGH (ref 0.00–1.49)
Prothrombin Time: 21.9 seconds — ABNORMAL HIGH (ref 11.6–15.2)

## 2011-10-14 LAB — TSH: TSH: 0.892 u[IU]/mL (ref 0.350–4.500)

## 2011-10-14 LAB — MAGNESIUM: Magnesium: 2 mg/dL (ref 1.5–2.5)

## 2011-10-14 MED ORDER — ALBUTEROL SULFATE (5 MG/ML) 0.5% IN NEBU
2.5000 mg | INHALATION_SOLUTION | Freq: Four times a day (QID) | RESPIRATORY_TRACT | Status: DC
  Administered 2011-10-14 – 2011-10-19 (×23): 2.5 mg via RESPIRATORY_TRACT
  Filled 2011-10-14 (×24): qty 0.5

## 2011-10-14 MED ORDER — POTASSIUM CHLORIDE CRYS ER 20 MEQ PO TBCR
40.0000 meq | EXTENDED_RELEASE_TABLET | ORAL | Status: AC
  Administered 2011-10-14 (×2): 40 meq via ORAL
  Filled 2011-10-14 (×2): qty 2

## 2011-10-14 MED ORDER — ALBUTEROL SULFATE (5 MG/ML) 0.5% IN NEBU: 2.5000 mg | INHALATION_SOLUTION | RESPIRATORY_TRACT | Status: DC | PRN

## 2011-10-14 MED ORDER — WARFARIN SODIUM 5 MG PO TABS
5.0000 mg | ORAL_TABLET | Freq: Once | ORAL | Status: AC
  Administered 2011-10-14: 5 mg via ORAL
  Filled 2011-10-14: qty 1

## 2011-10-14 MED ORDER — IPRATROPIUM BROMIDE 0.02 % IN SOLN
0.5000 mg | Freq: Four times a day (QID) | RESPIRATORY_TRACT | Status: DC
Start: 2011-10-14 — End: 2011-10-20
  Administered 2011-10-14 – 2011-10-19 (×23): 0.5 mg via RESPIRATORY_TRACT
  Filled 2011-10-14 (×24): qty 2.5

## 2011-10-14 MED ORDER — FUROSEMIDE 40 MG PO TABS
40.0000 mg | ORAL_TABLET | Freq: Every day | ORAL | Status: DC
  Administered 2011-10-14 – 2011-10-20 (×7): 40 mg via ORAL
  Filled 2011-10-14 (×7): qty 1

## 2011-10-14 NOTE — Progress Notes (Signed)
PT Cancellation Note  Evaluation cancelled today due to pt had been vomiting upon checking in room in am and again in pm and did not feel up to therapy today.  Angelica [name],[name] 10/14/2011, 2:48 PM Pager: [phone]

## 2011-10-14 NOTE — Progress Notes (Signed)
ANTICOAGULATION CONSULT NOTE - Follow Up Consult  Pharmacy Consult for Warfarin Indication: A-Fib  Allergies  Allergen Reactions  . Amoxicillin Hives  . Penicillins Hives    Patient Measurements: Height: 5' (152.4 cm) Weight: 151 lb 14.4 oz (68.9 kg) IBW/kg (Calculated) : 45.5   Vital Signs: Temp: 98.7 F (37.1 C) (04/12 0425) Temp src: Oral (04/12 0425) BP: 91/55 mmHg (04/12 0425) Pulse Rate: 82  (04/12 0425)  Labs:  Basename 10/14/11 0440 10/13/11 2050 10/13/11 1200 10/13/11 1156 10/12/11 1101  HGB 9.0* 9.0* -- -- --  HCT 30.6* 30.1* -- 32.9* --  PLT 329 336 -- 375 --  APTT -- -- -- -- --  LABPROT 21.9* -- -- 23.6* --  INR 1.88* -- -- 2.06* 2.5  HEPARINUNFRC -- -- -- -- --  CREATININE 0.94 -- -- 1.07 --  CKTOTAL -- -- -- -- --  CKMB -- -- -- -- --  TROPONINI -- -- <0.30 -- --   Estimated Creatinine Clearance: 37.2 ml/min (by C-G formula based on Cr of 0.94).   Medications:  Scheduled:    . acetaminophen      . albuterol  2.5 mg Nebulization QID  . albuterol  5 mg Nebulization Once  . budesonide  0.5 mg Nebulization BID  . calcium-vitamin D  1 tablet Oral Daily  . diltiazem  180 mg Oral Daily  . ferrous sulfate  325 mg Oral Daily  . fluticasone  2 spray Each Nare BID  . furosemide  40 mg Oral Daily  . ipratropium  0.5 mg Nebulization Once  . ipratropium  0.5 mg Nebulization QID  . loratadine  10 mg Oral Daily  . mulitivitamin with minerals  1 tablet Oral Daily  . potassium chloride  40 mEq Oral Once  . potassium chloride  20 mEq Oral Daily  . potassium chloride  40 mEq Oral Q4H  . potassium chloride  40 mEq Oral Q4H  . simvastatin  10 mg Oral QHS  . sodium chloride  1,000 mL Intravenous Once  . warfarin  1 mg Oral Once  . Warfarin - Pharmacist Dosing Inpatient   Does not apply q1800  . DISCONTD: albuterol  2.5 mg Nebulization Q6H  . DISCONTD: Calcium Carbonate-Vitamin D  1 tablet Oral Daily  . DISCONTD: furosemide  60 mg Oral Daily  . DISCONTD:  ipratropium  0.5 mg Nebulization Q6H  . DISCONTD: multivitamins ther. w/minerals  1 tablet Oral Daily   Infusions:    . sodium chloride 75 mL/hr at 10/13/11 2231   PRN: acetaminophen, acetaminophen, albuterol, ondansetron (ZOFRAN) IV, ondansetron, DISCONTD: albuterol  Assessment: 78 yo F presented to ER 4/11 with increased weakness, N/V, and wheezing. On chronic warfarin for hx of Afib. Home dose reportedly 6mg  daily except 4mg  on Tues/Th/Sat. Of note, patient has had an AVM and GI bleed and instructions are to keep INR as close as possible to 2 per MD. No bleeding events reported in chart. INR slightly below goal of 2. Was given 1mg  of warfarin last night. Since patient is ordered Full Liquid diet, will use conservative warfarin dosing (compared to home regimen). INR likely to fluctuate with change in dietary habits while admitted.  Goal of Therapy:  INR = 2   Plan:  1) Warfarin 5mg  PO x1 at 18:00 2) F/U daily INR trend 3) Attempt to maintain INR ~ 2 4) Watch for S/Sx GI bleed. - call MD if this occurs  Thomasena Edis, Cathlean Cower 10/14/2011,10:58 AM

## 2011-10-14 NOTE — Progress Notes (Signed)
CARE MANAGEMENT NOTE 10/14/2011  Patient:  JONICE, CERRA   Account Number:  [id number]  Date Initiated:  10/14/2011  Documentation initiated by:  Spyridon Hornstein  Subjective/Objective Assessment:   pt with gi bleed and INR greater than 2.0 on admission, on coumadin for atrial fib at home,     Action/Plan:   lives at home with family   Anticipated DC Date:  10/17/2011   Anticipated DC Plan:  HOME/SELF CARE  In-house referral  NA      DC Planning Services  NA      [facility] Choice  NA   Choice offered to / List presented to:  NA   DME arranged  NA      DME agency  NA     HH arranged  NA      HH agency  NA   Status of service:  In process, will continue to follow Medicare Important Message given?  YES (If response is "NO", the following Medicare IM given date fields will be blank) Date Medicare IM given:  10/13/2011 Date Additional Medicare IM given:    Discharge Disposition:    Per UR Regulation:  Reviewed for med. necessity/level of care/duration of stay  If discussed at Long Length of Stay Meetings, dates discussed:    Comments:  [id number]/Hakiem Malizia,RN,BSN,CCM

## 2011-10-14 NOTE — Progress Notes (Signed)
Subjective: Patient more alert today; but unable to answer where she was or date (not sure what is baseline with hx of dementia). Had one episode of diarrhea and also one episode of vomiting since admission. She also spike fever overnight.  Objective: Vital signs in last 24 hours: Temp:  [98.7 F (37.1 C)-104 F (40 C)] 98.7 F (37.1 C) (04/12 0425) Pulse Rate:  [82-106] 82  (04/12 0425) Resp:  [16-20] 20  (04/12 0425) BP: (83-125)/(53-67) 91/55 mmHg (04/12 0425) SpO2:  [94 %-99 %] 96 % (04/12 0831) Weight:  [68.9 kg (151 lb 14.4 oz)-69.7 kg (153 lb 10.6 oz)] 68.9 kg (151 lb 14.4 oz) (04/12 0425) Weight change:  Last BM Date: 10/13/11  Intake/Output from previous day: 04/11 0701 - 04/12 0700 In: 36.3 [I.V.:36.3] Out: 200 [Urine:200]     Physical Exam: General: Alert, awake able to answer simple questions. In no acute distress. HEENT: No bruits, no goiter. Heart: Regular rate and rhythm, without murmurs, rubs, gallops. Lungs: Clear to auscultation bilaterally. Abdomen: Soft, nontender, nondistended, positive bowel sounds. Extremities: No clubbing, cyanosis or edema with positive pedal pulses. Neuro: Nonfocal.   Lab Results: Basic Metabolic Panel:  Basename 10/14/11 0440 10/13/11 1156  NA 145 143  K 2.8* 3.2*  CL 110 104  CO2 25 31  GLUCOSE 127* 130*  BUN 15 18  CREATININE 0.94 1.07  CALCIUM 8.4 9.4  MG -- --  PHOS -- --   CBC:  Basename 10/14/11 0440 10/13/11 2050 10/13/11 1156  WBC 11.5* 8.9 --  NEUTROABS -- -- 7.5  HGB 9.0* 9.0* --  HCT 30.6* 30.1* --  MCV 90.8 89.6 --  PLT 329 336 --   Cardiac Enzymes:  Basename 10/13/11 1200  CKTOTAL --  CKMB --  CKMBINDEX --  TROPONINI <0.30   Fasting Lipid Panel:  Basename 10/14/11 0440  CHOL 119  HDL 68  LDLCALC 41  TRIG 51  CHOLHDL 1.8  LDLDIRECT --   Thyroid Function Tests:  Basename 10/13/11 2050  TSH 0.892  T4TOTAL --  FREET4 --  T3FREE --  THYROIDAB --   Coagulation:  Basename 10/14/11  0440 10/13/11 1156  LABPROT 21.9* 23.6*  INR 1.88* 2.06*   Urinalysis:  Basename 10/13/11 1209  COLORURINE YELLOW  LABSPEC 1.022  PHURINE 6.0  GLUCOSEU NEGATIVE  HGBUR NEGATIVE  BILIRUBINUR NEGATIVE  KETONESUR NEGATIVE  PROTEINUR NEGATIVE  UROBILINOGEN 0.2  NITRITE NEGATIVE  LEUKOCYTESUR NEGATIVE   Misc. Labs:  Recent Results (from the past 240 hour(s))  CLOSTRIDIUM DIFFICILE BY PCR     Status: Normal   Collection Time   10/13/11  4:07 PM      Component Value Range Status Comment   C difficile by pcr NEGATIVE  NEGATIVE  Final   MRSA PCR SCREENING     Status: Normal   Collection Time   10/14/11  3:34 AM      Component Value Range Status Comment   MRSA by PCR NEGATIVE  NEGATIVE  Final     Studies/Results: Dg Chest 2 View  10/13/2011  *RADIOLOGY REPORT*  Clinical Data: Rectal bleeding, weakness.  CHEST - 2 VIEW  Comparison: 09/18/2011  Findings: Cardiomegaly.  Vascular congestion and peribronchial thickening.  Slight interstitial prominence is stable.  Scarring or atelectasis in the perihilar regions bilaterally.  No effusions. No acute bony abnormality.  IMPRESSION: Cardiomegaly, vascular congestion.  Bronchitic changes.  Perihilar scarring or atelectasis.  Original Report Authenticated By: Cyndie Chime, M.D.    Medications: Scheduled Meds:   .  acetaminophen      . albuterol  2.5 mg Nebulization QID  . albuterol  5 mg Nebulization Once  . budesonide  0.5 mg Nebulization BID  . calcium-vitamin D  1 tablet Oral Daily  . diltiazem  180 mg Oral Daily  . ferrous sulfate  325 mg Oral Daily  . fluticasone  2 spray Each Nare BID  . furosemide  40 mg Oral Daily  . ipratropium  0.5 mg Nebulization Once  . ipratropium  0.5 mg Nebulization QID  . loratadine  10 mg Oral Daily  . mulitivitamin with minerals  1 tablet Oral Daily  . potassium chloride  40 mEq Oral Once  . potassium chloride  20 mEq Oral Daily  . potassium chloride  40 mEq Oral Q4H  . potassium chloride  40 mEq  Oral Q4H  . simvastatin  10 mg Oral QHS  . sodium chloride  1,000 mL Intravenous Once  . warfarin  1 mg Oral Once  . warfarin  5 mg Oral ONCE-1800  . Warfarin - Pharmacist Dosing Inpatient   Does not apply q1800  . DISCONTD: albuterol  2.5 mg Nebulization Q6H  . DISCONTD: Calcium Carbonate-Vitamin D  1 tablet Oral Daily  . DISCONTD: furosemide  60 mg Oral Daily  . DISCONTD: ipratropium  0.5 mg Nebulization Q6H  . DISCONTD: multivitamins ther. w/minerals  1 tablet Oral Daily   Continuous Infusions:   . sodium chloride 75 mL/hr at 10/13/11 2231   PRN Meds:.acetaminophen, acetaminophen, albuterol, ondansetron (ZOFRAN) IV, ondansetron, DISCONTD: albuterol  Assessment/Plan: 1-Diarrhea: most likely 2/2 viral gastroenteritis; c. Diff by PCR is negative. Will continue supportive care, electrolytes repletion and gentle hydration.  Patient spike fever overnight, will use tylenol PRN.   2-A-fib, rate controlled and on coumadin: will continue current regimen.   3-CVA (cerebral vascular accident): on coumadin now; no new deficit appreciated.   4-COPD: continue inhalers and nebulizer; continue oxygen supplementation and will follow O2 sats. Currently no wheezing.  5-Iron deficiency anemia secondary to blood loss (chronic): continue ferrous sulfate and also Hgb trend; Hgb today 9.0.   6-Allergic rhinitis, seasonal:continue fluticasone   7-Hypokalemia: 2/2 diuretics and diarrhea; continue repletion and will check Mg level.   8-Weakness generalized: 2/2 to mild dehydration and electrolytes disturbances; will gently rehydrated and follow her symptoms; will follow PT rec's.   9-DVT: SCD's     LOS: 1 day   Saja Bartolini [facility] [phone]  10/14/2011, 11:09 AM

## 2011-10-15 LAB — CBC
HCT: 31 % — ABNORMAL LOW (ref 36.0–46.0)
Hemoglobin: 8.8 g/dL — ABNORMAL LOW (ref 12.0–15.0)
MCH: 26.7 pg (ref 26.0–34.0)
MCHC: 28.4 g/dL — ABNORMAL LOW (ref 30.0–36.0)
MCV: 93.9 fL (ref 78.0–100.0)
Platelets: 308 K/uL (ref 150–400)
RBC: 3.3 MIL/uL — ABNORMAL LOW (ref 3.87–5.11)
RDW: 16 % — ABNORMAL HIGH (ref 11.5–15.5)
WBC: 5.4 K/uL (ref 4.0–10.5)

## 2011-10-15 LAB — BASIC METABOLIC PANEL
Calcium: 8.6 mg/dL (ref 8.4–10.5)
Creatinine, Ser: 0.69 mg/dL (ref 0.50–1.10)
GFR calc non Af Amer: 77 mL/min — ABNORMAL LOW (ref >90)
Sodium: 143 mEq/L (ref 135–145)

## 2011-10-15 LAB — PROTIME-INR
INR: 1.58 — ABNORMAL HIGH (ref 0.00–1.49)
Prothrombin Time: 19.2 s — ABNORMAL HIGH (ref 11.6–15.2)

## 2011-10-15 MED ORDER — SODIUM CHLORIDE 0.9 % IJ SOLN
3.0000 mL | Freq: Two times a day (BID) | INTRAMUSCULAR | Status: DC
  Administered 2011-10-15 – 2011-10-20 (×4): 3 mL via INTRAVENOUS

## 2011-10-15 MED ORDER — WARFARIN SODIUM 6 MG PO TABS
6.0000 mg | ORAL_TABLET | Freq: Once | ORAL | Status: AC
  Administered 2011-10-15: 6 mg via ORAL
  Filled 2011-10-15: qty 1

## 2011-10-15 NOTE — Progress Notes (Signed)
ANTICOAGULATION CONSULT NOTE - Follow Up Consult  Pharmacy Consult for Warfarin Indication: A-Fib  Allergies  Allergen Reactions  . Amoxicillin Hives  . Penicillins Hives    Patient Measurements: Height: 5' (152.4 cm) Weight: 153 lb (69.4 kg) IBW/kg (Calculated) : 45.5   Vital Signs: Temp: 99.1 F (37.3 C) (04/13 0734) Temp src: Oral (04/13 0734) BP: 105/59 mmHg (04/13 0734) Pulse Rate: 84  (04/13 0734)  Labs:  Basename 10/15/11 0810 10/15/11 0546 10/14/11 2009 10/14/11 0440 10/13/11 1200 10/13/11 1156  HGB 8.8* -- 8.7* -- -- --  HCT 31.0* -- 29.6* 30.6* -- --  PLT 308 -- 344 329 -- --  APTT -- -- -- -- -- --  LABPROT -- 19.2* -- 21.9* -- 23.6*  INR -- 1.58* -- 1.88* -- 2.06*  HEPARINUNFRC -- -- -- -- -- --  CREATININE -- 0.69 -- 0.94 -- 1.07  CKTOTAL -- -- -- -- -- --  CKMB -- -- -- -- -- --  TROPONINI -- -- -- -- <0.30 --   Estimated Creatinine Clearance: 43.9 ml/min (by C-G formula based on Cr of 0.69).   Medications:  Scheduled:     . albuterol  2.5 mg Nebulization QID  . budesonide  0.5 mg Nebulization BID  . calcium-vitamin D  1 tablet Oral Daily  . diltiazem  180 mg Oral Daily  . ferrous sulfate  325 mg Oral Daily  . fluticasone  2 spray Each Nare BID  . furosemide  40 mg Oral Daily  . ipratropium  0.5 mg Nebulization QID  . loratadine  10 mg Oral Daily  . mulitivitamin with minerals  1 tablet Oral Daily  . potassium chloride  20 mEq Oral Daily  . potassium chloride  40 mEq Oral Q4H  . simvastatin  10 mg Oral QHS  . warfarin  5 mg Oral ONCE-1800  . Warfarin - Pharmacist Dosing Inpatient   Does not apply q1800   Infusions:   PRN: acetaminophen, acetaminophen, albuterol, ondansetron (ZOFRAN) IV, ondansetron  Assessment:  78 yo F presented to ER 4/11 with increased weakness, N/V, and wheezing. On chronic warfarin for hx of Afib.   Home dose reportedly 6mg  daily except 4mg  on Tues/Th/Sat.  Of note, patient has had an AVM and GI bleed and  instructions are to keep INR as close as possible to 2 per MD.   No bleeding events reported in chart.  INR subtherapeutic and dropping. Was given 1mg  on 4/11 and 5mg  on 4/13   Goal of Therapy:  INR = 2   Plan:  1) Warfarin 6mg  PO x1 at 18:00 2) F/U daily INR trend 3) Attempt to maintain INR ~ 2 4) Watch for S/Sx GI bleed. - call MD if this occurs   Hessie Knows, PharmD, BCPS Pager [phone] 10/15/2011 10:44 AM

## 2011-10-15 NOTE — Progress Notes (Signed)
Subjective: Patient more alert today; oriented X2. Had one episode of diarrhea; but no further nausea or vomiting. No fever.  Objective: Vital signs in last 24 hours: Temp:  [98.7 F (37.1 C)-99.1 F (37.3 C)] 99.1 F (37.3 C) (04/13 0734) Pulse Rate:  [84-103] 84  (04/13 0734) Resp:  [16-20] 16  (04/13 0734) BP: (96-105)/(57-59) 105/59 mmHg (04/13 0734) SpO2:  [97 %-100 %] 97 % (04/13 0841) Weight:  [69.4 kg (153 lb)] 69.4 kg (153 lb) (04/13 0734) Weight change:  Last BM Date: 10/15/11  Intake/Output from previous day: 04/12 0701 - 04/13 0700 In: 220 [P.O.:220] Out: -  Total I/O In: 400 [P.O.:400] Out: -    Physical Exam: General: Alert, awake able to answer simple questions. In no acute distress. HEENT: No bruits, no goiter. Heart: Regular rate and rhythm, without murmurs, rubs, gallops. Lungs: Clear to auscultation bilaterally. Abdomen: Soft, nontender, nondistended, positive bowel sounds. Extremities: No clubbing, cyanosis or edema with positive pedal pulses. Neuro: Nonfocal.   Lab Results: Basic Metabolic Panel:  Basename 10/15/11 0546 10/14/11 1420 10/14/11 0440  NA 143 -- 145  K 4.0 -- 2.8*  CL 114* -- 110  CO2 21 -- 25  GLUCOSE 96 -- 127*  BUN 13 -- 15  CREATININE 0.69 -- 0.94  CALCIUM 8.6 -- 8.4  MG -- 2.0 --  PHOS -- -- --   CBC:  Basename 10/15/11 0810 10/14/11 2009 10/13/11 1156  WBC 5.4 7.4 --  NEUTROABS -- -- 7.5  HGB 8.8* 8.7* --  HCT 31.0* 29.6* --  MCV 93.9 91.9 --  PLT 308 344 --   Cardiac Enzymes:  Basename 10/13/11 1200  CKTOTAL --  CKMB --  CKMBINDEX --  TROPONINI <0.30   Fasting Lipid Panel:  Basename 10/14/11 0440  CHOL 119  HDL 68  LDLCALC 41  TRIG 51  CHOLHDL 1.8  LDLDIRECT --   Thyroid Function Tests:  Basename 10/13/11 2050  TSH 0.892  T4TOTAL --  FREET4 --  T3FREE --  THYROIDAB --   Coagulation:  Basename 10/15/11 0546 10/14/11 0440  LABPROT 19.2* 21.9*  INR 1.58* 1.88*   Urinalysis:  Basename  10/13/11 1209  COLORURINE YELLOW  LABSPEC 1.022  PHURINE 6.0  GLUCOSEU NEGATIVE  HGBUR NEGATIVE  BILIRUBINUR NEGATIVE  KETONESUR NEGATIVE  PROTEINUR NEGATIVE  UROBILINOGEN 0.2  NITRITE NEGATIVE  LEUKOCYTESUR NEGATIVE   Misc. Labs:  Recent Results (from the past 240 hour(s))  CLOSTRIDIUM DIFFICILE BY PCR     Status: Normal   Collection Time   10/13/11  4:07 PM      Component Value Range Status Comment   C difficile by pcr NEGATIVE  NEGATIVE  Final   CULTURE, BLOOD (ROUTINE X 2)     Status: Normal (Preliminary result)   Collection Time   10/13/11 10:10 PM      Component Value Range Status Comment   Specimen Description BLOOD RIGHT HAND   Final    Special Requests BOTTLES DRAWN AEROBIC AND ANAEROBIC 2.5ML   Final    Culture  Setup Time [id number]   Final    Culture     Final    Value:        BLOOD CULTURE RECEIVED NO GROWTH TO DATE CULTURE WILL BE HELD FOR 5 DAYS BEFORE ISSUING A FINAL NEGATIVE REPORT   Report Status PENDING   Incomplete   CULTURE, BLOOD (ROUTINE X 2)     Status: Normal (Preliminary result)   Collection Time   10/13/11 10:15 PM  Component Value Range Status Comment   Specimen Description BLOOD LEFT ARM   Final    Special Requests BOTTLES DRAWN AEROBIC AND ANAEROBIC 2.5ML   Final    Culture  Setup Time [id number]   Final    Culture     Final    Value:        BLOOD CULTURE RECEIVED NO GROWTH TO DATE CULTURE WILL BE HELD FOR 5 DAYS BEFORE ISSUING A FINAL NEGATIVE REPORT   Report Status PENDING   Incomplete   MRSA PCR SCREENING     Status: Normal   Collection Time   10/14/11  3:34 AM      Component Value Range Status Comment   MRSA by PCR NEGATIVE  NEGATIVE  Final     Studies/Results: Dg Chest 2 View  10/13/2011  *RADIOLOGY REPORT*  Clinical Data: Rectal bleeding, weakness.  CHEST - 2 VIEW  Comparison: 09/18/2011  Findings: Cardiomegaly.  Vascular congestion and peribronchial thickening.  Slight interstitial prominence is stable.  Scarring or  atelectasis in the perihilar regions bilaterally.  No effusions. No acute bony abnormality.  IMPRESSION: Cardiomegaly, vascular congestion.  Bronchitic changes.  Perihilar scarring or atelectasis.  Original Report Authenticated By: Cyndie Chime, M.D.    Medications: Scheduled Meds:    . albuterol  2.5 mg Nebulization QID  . budesonide  0.5 mg Nebulization BID  . calcium-vitamin D  1 tablet Oral Daily  . diltiazem  180 mg Oral Daily  . ferrous sulfate  325 mg Oral Daily  . fluticasone  2 spray Each Nare BID  . furosemide  40 mg Oral Daily  . ipratropium  0.5 mg Nebulization QID  . loratadine  10 mg Oral Daily  . mulitivitamin with minerals  1 tablet Oral Daily  . potassium chloride  20 mEq Oral Daily  . potassium chloride  40 mEq Oral Q4H  . simvastatin  10 mg Oral QHS  . warfarin  5 mg Oral ONCE-1800  . warfarin  6 mg Oral ONCE-1800  . Warfarin - Pharmacist Dosing Inpatient   Does not apply q1800   Continuous Infusions:  PRN Meds:.acetaminophen, acetaminophen, albuterol, ondansetron (ZOFRAN) IV, ondansetron  Assessment/Plan: 1-Diarrhea: most likely 2/2 viral gastroenteritis; c. Diff by PCR is negative. Will continue supportive care, electrolytes repletion and follow symptoms. Patient w/o any more fever.    2-A-fib, rate controlled and on coumadin: will continue current regimen. Pharmacy to manage warfarin. Goal is a close as possible to 2.0  3-CVA (cerebral vascular accident): on coumadin now; no new deficit appreciated.   4-COPD: continue inhalers and nebulizer as per home regimen. Will titrate oxygen down to RA if tolerated. No SOB and CTA lung fields.  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   6-Allergic rhinitis, seasonal: continue fluticasone   7-Hypokalemia: 2/2 diuretics and diarrhea; Repleted.   8-Weakness generalized: 2/2 to mild dehydration and electrolytes disturbances; will continue  supportive care and start PT and OOB with assistance to improve deconditioning.  9-DVT: SCD's; already on coumadin.     LOS: 2 days   Tynslee Bowlds [facility] [phone]  10/15/2011, 10:54 AM

## 2011-10-15 NOTE — Evaluation (Signed)
Physical Therapy Evaluation Patient Details Name: Angelica [name] MRN: [id number] DOB: [birth year] Today's Date: 10/15/2011  Problem List:  Patient Active Problem List  Diagnoses  . A-fib, rate controlled on coumadin  . CVA (cerebral vascular accident)  . CHF (congestive heart failure)  . COPD   . 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 Pneumonia  . Anemia due to GI blood loss  . Iron deficiency anemia secondary to blood loss (chronic)  . Arteriovenous malformation of gastrointestinal tract  . Allergic rhinitis, seasonal  . Diarrhea  . Hypokalemia  . Weakness generalized    Past Medical History:  Past Medical History  Diagnosis Date  . Atrial fibrillation   . Stroke   . Hypertension   . Arthritis   . GERD (gastroesophageal reflux disease)   . COPD (chronic obstructive pulmonary disease)   . PNA (pneumonia)   . Anemia due to GI blood loss 07/11/2011  . 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 Arteriovenous malformation of gastrointestinal tract 07/11/2011   Past Surgical History:  Past Surgical History  Procedure Date  . Hip fracture surgery   . Cholecystectomy     PT Assessment/Plan/Recommendation PT Assessment Clinical Impression Statement: Patient admitted with viral gastroenteritis presents with decreased independence with mobility due to generalized weakness and will benefit from skilled PT in the acute setting to maximize independence and safety and allow d/c home with spouse assist (24/7 supervision recommended due to cognition) PT Recommendation/Assessment: Patient will need skilled PT in the acute care venue PT Problem List: Decreased mobility;Decreased strength;Decreased activity tolerance PT Therapy Diagnosis : Abnormality of gait;Generalized weakness PT Plan PT Frequency: Min 3X/week PT Treatment/Interventions: Gait training;DME instruction;Functional mobility training;Therapeutic activities;Balance training;Patient/family education;Therapeutic  exercise PT Recommendation Follow Up Recommendations: Home health PT;Supervision/Assistance - 24 hour Equipment Recommended: None recommended by PT PT Goals  Acute Rehab PT Goals PT Goal Formulation: With patient Time For Goal Achievement: 7 days Pt will go Supine/Side to Sit: with modified independence PT Goal: Supine/Side to Sit - Progress: Goal set today Pt will go Sit to Stand: with modified independence PT Goal: Sit to Stand - Progress: Goal set today Pt will Stand: with modified independence;with unilateral upper extremity support;3 - 5 min (during functional task) PT Goal: Stand - Progress: Goal set today Pt will Ambulate: >150 feet;with least restrictive assistive device;with modified independence PT Goal: Ambulate - Progress: Goal set today Pt will Perform Home Exercise Program: with supervision, verbal cues required/provided PT Goal: Perform Home Exercise Program - Progress: Goal set today  PT Evaluation Precautions/Restrictions  Precautions Precautions: Fall Precaution Comments: stool (diarrhea) and urinary incontinence Prior Functioning  Home Living Lives With: Spouse Available Help at Discharge: Family Type of Home: House Home Access: Level entry Home Layout: One level Bathroom Shower/Tub: Walk-in shower Home Adaptive Equipment: Walker - rolling;Straight cane;Shower chair with back Additional Comments: Patient not reliable historian Prior Function Level of Independence: Independent with assistive device(s) Cognition Cognition Arousal/Alertness: Awake/alert Overall Cognitive Status: Impaired Attention: Impaired Current Attention Level: Sustained Orientation Level: Oriented to person;Disoriented to place;Disoriented to time;Disoriented to situation Following Commands: Follows one step commands consistently Cognition - Other Comments: Knew month, not year, knew in hospital, not which one, did not recall why she was here Sensation/Coordination   Extremity  Assessment RLE Assessment RLE Assessment: Within Functional Limits LLE Assessment LLE Assessment: Within Functional Limits Mobility (including Balance) Bed Mobility Bed Mobility: Yes Supine to Sit: 5: Supervision;With rails;HOB elevated (Comment degrees) Supine to Sit Details (indicate cue type and reason):  HOB 40* Sitting - Scoot to Edge of Bed: 4: Min assist Sitting - Scoot to [place] of Bed Details (indicate cue type and reason): cues and increased time, then assist to get fully out on edge of bed pulling pad under patient. Transfers Transfers: Yes Sit to Stand: 4: Min assist;From bed Sit to Stand Details (indicate cue type and reason): minguard assist, cues for safety Stand to Sit: 4: Min assist;To chair/3-in-1 Stand to Sit Details: minguard assist, cues for safety Ambulation/Gait Ambulation/Gait: Yes Ambulation/Gait Assistance: 4: Min assist Ambulation/Gait Assistance Details (indicate cue type and reason): minguard assist; walker veers right, but patient maneuvers around obstacles and turns approrpiately Ambulation Distance (Feet): 150 Feet Assistive device: Rolling walker Gait Pattern: Decreased stride length (wide BOS) Gait velocity: slow; but >1.8 ft/sec    Exercise    End of Session PT - End of Session Equipment Utilized During Treatment: Gait belt Activity Tolerance: Patient tolerated treatment well Patient left: in chair;with family/visitor present (nurse in room) General Behavior During Session: [facility] for tasks performed Cognition: Impaired Angelica [name],Angelica [name] 10/15/2011, 12:01 PM

## 2011-10-16 LAB — CBC
MCHC: 28.9 g/dL — ABNORMAL LOW (ref 30.0–36.0)
RDW: 15.4 % (ref 11.5–15.5)

## 2011-10-16 LAB — BASIC METABOLIC PANEL
Calcium: 8.8 mg/dL (ref 8.4–10.5)
GFR calc Af Amer: 90 mL/min — ABNORMAL LOW (ref >90)
GFR calc non Af Amer: 77 mL/min — ABNORMAL LOW (ref >90)
Potassium: 2.7 mEq/L — CL (ref 3.5–5.1)
Sodium: 141 mEq/L (ref 135–145)

## 2011-10-16 LAB — PROTIME-INR: Prothrombin Time: 22 seconds — ABNORMAL HIGH (ref 11.6–15.2)

## 2011-10-16 MED ORDER — WARFARIN SODIUM 4 MG PO TABS
4.0000 mg | ORAL_TABLET | Freq: Once | ORAL | Status: AC
  Administered 2011-10-16: 4 mg via ORAL
  Filled 2011-10-16: qty 1

## 2011-10-16 MED ORDER — SODIUM CHLORIDE 0.9 % IV SOLN
INTRAVENOUS | Status: DC
  Administered 2011-10-16 – 2011-10-17 (×2): via INTRAVENOUS
  Administered 2011-10-17: 1000 mL via INTRAVENOUS
  Administered 2011-10-18 – 2011-10-19 (×3): via INTRAVENOUS

## 2011-10-16 MED ORDER — POTASSIUM CHLORIDE CRYS ER 20 MEQ PO TBCR
40.0000 meq | EXTENDED_RELEASE_TABLET | ORAL | Status: AC
Start: 2011-10-16 — End: 2011-10-17
  Administered 2011-10-16 – 2011-10-17 (×4): 40 meq via ORAL
  Filled 2011-10-16 (×4): qty 2

## 2011-10-16 MED ORDER — DIPHENOXYLATE-ATROPINE 2.5-0.025 MG PO TABS
1.0000 | ORAL_TABLET | Freq: Four times a day (QID) | ORAL | Status: DC | PRN
  Administered 2011-10-16 – 2011-10-17 (×3): 1 via ORAL
  Filled 2011-10-16 (×2): qty 1
  Filled 2011-10-16: qty 2

## 2011-10-16 NOTE — Progress Notes (Signed)
10/15/11-23:56---Flexiseal Drain insert, due to copious amt's of watery diarrhea-pt tol proceduere well. Perineum red, barrier cream & peri care given freq due to freq diarrhea tonight.

## 2011-10-16 NOTE — Progress Notes (Signed)
ANTICOAGULATION CONSULT NOTE - Follow Up Consult  Pharmacy Consult for Warfarin Indication: A-Fib  Allergies  Allergen Reactions  . Amoxicillin Hives  . Penicillins Hives    Patient Measurements: Height: 5' (152.4 cm) Weight: 157 lb 10.1 oz (71.5 kg) IBW/kg (Calculated) : 45.5   Vital Signs: Temp: 98.5 F (36.9 C) (04/14 0531) Temp src: Oral (04/14 0531) BP: 97/71 mmHg (04/14 0531) Pulse Rate: 74  (04/14 0531)  Labs:  Basename 10/16/11 0930 10/16/11 0527 10/15/11 0810 10/15/11 0546 10/14/11 2009 10/14/11 0440 10/13/11 1200  HGB -- 8.9* 8.8* -- -- -- --  HCT -- 30.8* 31.0* -- 29.6* -- --  PLT -- 317 308 -- 344 -- --  APTT -- -- -- -- -- -- --  LABPROT -- 22.0* -- 19.2* -- 21.9* --  INR -- 1.89* -- 1.58* -- 1.88* --  HEPARINUNFRC -- -- -- -- -- -- --  CREATININE 0.67 -- -- 0.69 -- 0.94 --  CKTOTAL -- -- -- -- -- -- --  CKMB -- -- -- -- -- -- --  TROPONINI -- -- -- -- -- -- <0.30   Estimated Creatinine Clearance: 44.5 ml/min (by C-G formula based on Cr of 0.67).   Medications:  Scheduled:     . albuterol  2.5 mg Nebulization QID  . budesonide  0.5 mg Nebulization BID  . calcium-vitamin D  1 tablet Oral Daily  . diltiazem  180 mg Oral Daily  . ferrous sulfate  325 mg Oral Daily  . fluticasone  2 spray Each Nare BID  . furosemide  40 mg Oral Daily  . ipratropium  0.5 mg Nebulization QID  . loratadine  10 mg Oral Daily  . mulitivitamin with minerals  1 tablet Oral Daily  . potassium chloride  40 mEq Oral Q4H  . simvastatin  10 mg Oral QHS  . sodium chloride  3 mL Intravenous Q12H  . warfarin  6 mg Oral ONCE-1800  . Warfarin - Pharmacist Dosing Inpatient   Does not apply q1800  . DISCONTD: potassium chloride  20 mEq Oral Daily   Infusions:     . sodium chloride     PRN: acetaminophen, acetaminophen, albuterol, ondansetron (ZOFRAN) IV, ondansetron  Assessment:  78 yo F presented to ER 4/11 with increased weakness, N/V, and wheezing. On chronic warfarin  for hx of Afib.   Home dose reportedly 6mg  daily except 4mg  on Tues/Th/Sat.  Of note, patient has had an AVM and GI bleed and instructions are to keep INR as close as possible to 2 per MD.   No bleeding events reported in chart.  INR subtherapeutic but increased. Was given 1mg  on 4/11 and 5mg  on 4/12 and 6mg  on 4/13   Goal of Therapy:  INR = 2   Plan:  1) Given rise in INR from yesterday and fact that it will be very difficult to keep patient's INR at 2.0, will go with 4mg  today 2) F/U daily INR trend 3) Attempt to maintain INR ~ 2 4) Watch for S/Sx GI bleed. - call MD if this occurs   Hessie Knows, PharmD, BCPS Pager [phone] 10/16/2011 10:54 AM

## 2011-10-16 NOTE — Plan of Care (Signed)
Problem: Consults Goal: GI Bleeding Patient Education See Patient Education Module for education specifics.  Outcome: Not Progressing Pt has dementia

## 2011-10-16 NOTE — Progress Notes (Signed)
Subjective: Patient more alert today; continue to be oriented X2. Overnight with multiple episodes of diarrhea (required flexiseal to be place, for skin integrity); no fever. O2 sat 93% RA. No nausea or vomiting and is willing to have diet advance to something with more consistency.  Objective: Vital signs in last 24 hours: Temp:  [98.2 F (36.8 C)-98.5 F (36.9 C)] 98.2 F (36.8 C) (04/14 1406) Pulse Rate:  [74-85] 84  (04/14 1406) Resp:  [16-18] 16  (04/14 1406) BP: (97-98)/(64-71) 97/65 mmHg (04/14 1406) SpO2:  [89 %-92 %] 92 % (04/14 1406) Weight:  [71.5 kg (157 lb 10.1 oz)] 71.5 kg (157 lb 10.1 oz) (04/14 0531) Weight change:  Last BM Date: 10/16/11  Intake/Output from previous day: 04/13 0701 - 04/14 0700 In: 883 [P.O.:880; I.V.:3] Out: -  Total I/O In: 120 [P.O.:120] Out: -    Physical Exam: General: Alert, awake able to answer simple questions. In no acute distress. HEENT: No bruits, no goiter. Heart: Regular rate and rhythm, without murmurs, rubs, gallops. Lungs: Clear to auscultation bilaterally. Abdomen: Soft, nontender, nondistended, positive bowel sounds. Flexiseal in place (so far 1.2 L output) Extremities: No clubbing, cyanosis or edema with positive pedal pulses. Neuro: Nonfocal.   Lab Results: Basic Metabolic Panel:  Basename 10/16/11 0930 10/15/11 0546 10/14/11 1420  NA 141 143 --  K 2.7* 4.0 --  CL 108 114* --  CO2 25 21 --  GLUCOSE 113* 96 --  BUN 9 13 --  CREATININE 0.67 0.69 --  CALCIUM 8.8 8.6 --  MG 1.8 -- 2.0  PHOS -- -- --   CBC:  Basename 10/16/11 0527 10/15/11 0810  WBC 4.9 5.4  NEUTROABS -- --  HGB 8.9* 8.8*  HCT 30.8* 31.0*  MCV 91.1 93.9  PLT 317 308   Fasting Lipid Panel:  Basename 10/14/11 0440  CHOL 119  HDL 68  LDLCALC 41  TRIG 51  CHOLHDL 1.8  LDLDIRECT --   Thyroid Function Tests:  Basename 10/13/11 2050  TSH 0.892  T4TOTAL --  FREET4 --  T3FREE --  THYROIDAB --   Coagulation:  Basename 10/16/11  0527 10/15/11 0546  LABPROT 22.0* 19.2*  INR 1.89* 1.58*   Misc. Labs:  Recent Results (from the past 240 hour(s))  CLOSTRIDIUM DIFFICILE BY PCR     Status: Normal   Collection Time   10/13/11  4:07 PM      Component Value Range Status Comment   C difficile by pcr NEGATIVE  NEGATIVE  Final   STOOL CULTURE     Status: Normal (Preliminary result)   Collection Time   10/13/11  4:07 PM      Component Value Range Status Comment   Specimen Description STOOL   Final    Special Requests NONE   Final    Culture     Final    Value: NO SUSPICIOUS COLONIES, CONTINUING TO HOLD     Note: REDUCED NORMAL FLORA PRESENT   Report Status PENDING   Incomplete   CULTURE, BLOOD (ROUTINE X 2)     Status: Normal (Preliminary result)   Collection Time   10/13/11 10:10 PM      Component Value Range Status Comment   Specimen Description BLOOD RIGHT HAND   Final    Special Requests BOTTLES DRAWN AEROBIC AND ANAEROBIC 2.5ML   Final    Culture  Setup Time [id number]   Final    Culture     Final    Value:  BLOOD CULTURE RECEIVED NO GROWTH TO DATE CULTURE WILL BE HELD FOR 5 DAYS BEFORE ISSUING A FINAL NEGATIVE REPORT   Report Status PENDING   Incomplete   CULTURE, BLOOD (ROUTINE X 2)     Status: Normal (Preliminary result)   Collection Time   10/13/11 10:15 PM      Component Value Range Status Comment   Specimen Description BLOOD LEFT ARM   Final    Special Requests BOTTLES DRAWN AEROBIC AND ANAEROBIC 2.5ML   Final    Culture  Setup Time [id number]   Final    Culture     Final    Value:        BLOOD CULTURE RECEIVED NO GROWTH TO DATE CULTURE WILL BE HELD FOR 5 DAYS BEFORE ISSUING A FINAL NEGATIVE REPORT   Report Status PENDING   Incomplete   MRSA PCR SCREENING     Status: Normal   Collection Time   10/14/11  3:34 AM      Component Value Range Status Comment   MRSA by PCR NEGATIVE  NEGATIVE  Final     Studies/Results: No results found.  Medications: Scheduled Meds:    . albuterol  2.5 mg  Nebulization QID  . budesonide  0.5 mg Nebulization BID  . calcium-vitamin D  1 tablet Oral Daily  . diltiazem  180 mg Oral Daily  . ferrous sulfate  325 mg Oral Daily  . fluticasone  2 spray Each Nare BID  . furosemide  40 mg Oral Daily  . ipratropium  0.5 mg Nebulization QID  . loratadine  10 mg Oral Daily  . mulitivitamin with minerals  1 tablet Oral Daily  . potassium chloride  40 mEq Oral Q4H  . simvastatin  10 mg Oral QHS  . sodium chloride  3 mL Intravenous Q12H  . warfarin  4 mg Oral ONCE-1800  . warfarin  6 mg Oral ONCE-1800  . Warfarin - Pharmacist Dosing Inpatient   Does not apply q1800  . DISCONTD: potassium chloride  20 mEq Oral Daily   Continuous Infusions:    . sodium chloride 75 mL/hr at 10/16/11 1406   PRN Meds:.acetaminophen, acetaminophen, albuterol, diphenoxylate-atropine, ondansetron (ZOFRAN) IV, ondansetron  Assessment/Plan: 1-Diarrhea: most likely 2/2 viral gastroenteritis. Continue supportive care, electrolytes repletion and follow symptoms. Patient w/o any more fever or any other complaints other than ongoing diarrhea.   2-A-fib, rate controlled and on coumadin: will continue current regimen. Pharmacy to manage warfarin. Goal is a close as possible to 2.0  3-CVA (cerebral vascular accident): on coumadin now; no new deficit appreciated.   4-COPD: continue inhalers and nebulizer as per home regimen. Now off oxygen with good O2 sat on RA. No SOB and CTA lung fields.  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   6-Allergic rhinitis, seasonal: continue fluticasone   7-Hypokalemia: 2/2 diuretics and diarrhea; will replete; magnesium WNL.   8-Weakness generalized: 2/2 to mild dehydration and electrolytes disturbances; will continue supportive care and PT to improve deconditioning.  9-DVT: SCD's; already on coumadin.     LOS: 3 days   Grisell Bissette [facility] [phone]  10/16/2011, 4:32 PM

## 2011-10-17 ENCOUNTER — Telehealth: Payer: Self-pay | Admitting: Pharmacist

## 2011-10-17 LAB — PROTIME-INR
INR: 2.47 — ABNORMAL HIGH (ref 0.00–1.49)
Prothrombin Time: 27.2 seconds — ABNORMAL HIGH (ref 11.6–15.2)

## 2011-10-17 LAB — BASIC METABOLIC PANEL
BUN: 6 mg/dL (ref 6–23)
CO2: 20 mEq/L (ref 19–32)
Calcium: 8.5 mg/dL (ref 8.4–10.5)
Creatinine, Ser: 0.67 mg/dL (ref 0.50–1.10)
GFR calc Af Amer: 90 mL/min — ABNORMAL LOW (ref >90)

## 2011-10-17 LAB — STOOL CULTURE

## 2011-10-17 MED ORDER — POTASSIUM CHLORIDE CRYS ER 20 MEQ PO TBCR
40.0000 meq | EXTENDED_RELEASE_TABLET | Freq: Every day | ORAL | Status: DC
  Administered 2011-10-17 – 2011-10-20 (×4): 40 meq via ORAL
  Filled 2011-10-17 (×4): qty 2

## 2011-10-17 MED ORDER — SODIUM CHLORIDE 0.9 % IV SOLN
125.0000 mg | Freq: Once | INTRAVENOUS | Status: AC
  Administered 2011-10-17: 125 mg via INTRAVENOUS
  Filled 2011-10-17: qty 10

## 2011-10-17 MED ORDER — WARFARIN SODIUM 4 MG PO TABS
4.0000 mg | ORAL_TABLET | Freq: Once | ORAL | Status: AC
  Administered 2011-10-17: 4 mg via ORAL
  Filled 2011-10-17: qty 1

## 2011-10-17 NOTE — Progress Notes (Signed)
ANTICOAGULATION CONSULT NOTE - Follow Up Consult  Pharmacy Consult for Warfarin Indication: A-Fib  Allergies  Allergen Reactions  . Amoxicillin Hives  . Penicillins Hives    Patient Measurements: Height: 5' (152.4 cm) Weight: 153 lb 14.1 oz (69.8 kg) IBW/kg (Calculated) : 45.5   Vital Signs: Temp: 99.7 F (37.6 C) (04/15 0355) Temp src: Oral (04/15 0355) BP: 96/58 mmHg (04/15 0355) Pulse Rate: 76  (04/15 0750)  Labs:  Basename 10/17/11 0415 10/16/11 0930 10/16/11 0527 10/15/11 0810 10/15/11 0546 10/14/11 2009  HGB -- -- 8.9* 8.8* -- --  HCT -- -- 30.8* 31.0* -- 29.6*  PLT -- -- 317 308 -- 344  APTT -- -- -- -- -- --  LABPROT 27.2* -- 22.0* -- 19.2* --  INR 2.47* -- 1.89* -- 1.58* --  HEPARINUNFRC -- -- -- -- -- --  CREATININE 0.67 0.67 -- -- 0.69 --  CKTOTAL -- -- -- -- -- --  CKMB -- -- -- -- -- --  TROPONINI -- -- -- -- -- --   Estimated Creatinine Clearance: 44 ml/min (by C-G formula based on Cr of 0.67).   Medications:  Scheduled:     . albuterol  2.5 mg Nebulization QID  . budesonide  0.5 mg Nebulization BID  . calcium-vitamin D  1 tablet Oral Daily  . diltiazem  180 mg Oral Daily  . ferrous sulfate  325 mg Oral Daily  . fluticasone  2 spray Each Nare BID  . furosemide  40 mg Oral Daily  . ipratropium  0.5 mg Nebulization QID  . loratadine  10 mg Oral Daily  . mulitivitamin with minerals  1 tablet Oral Daily  . potassium chloride  40 mEq Oral Q4H  . simvastatin  10 mg Oral QHS  . sodium chloride  3 mL Intravenous Q12H  . warfarin  4 mg Oral ONCE-1800  . Warfarin - Pharmacist Dosing Inpatient   Does not apply q1800  . DISCONTD: potassium chloride  20 mEq Oral Daily   Infusions:     . sodium chloride 75 mL/hr at 10/17/11 0545   PRN: acetaminophen, acetaminophen, albuterol, diphenoxylate-atropine, ondansetron (ZOFRAN) IV, ondansetron  Assessment:  78 yo F presented to ER 4/11 with increased weakness, N/V, and wheezing. On chronic warfarin for  hx of Afib.   Home dose reportedly 6mg  daily except 4mg  on Tues/Th/Sat.  Of note, patient has had an AVM and GI bleed and instructions are to keep INR as close as possible to 2 per MD.   No bleeding events reported in chart.  CBC low but stable.  INR 2.47 today, since above goal, will repeat patient's lower dosage of 4 mg  Goal of Therapy:  INR = 2   Plan:  1) Warfarin 4mg  today 2) F/U daily INR trend 3) Attempt to maintain INR ~ 2 4) Watch for S/Sx GI bleed. - call MD if this occurs  Clance Boll, PharmD, BCPS Pager: [phone] 10/17/2011 9:40 AM

## 2011-10-17 NOTE — Progress Notes (Signed)
Subjective: Patient even more alert and cooperative today. Continue with diarrhea; total of 1.2 L on flexiseal. No fever. O2 sat 93-94% RA. Also 1 episode of nausea and vomiting.  Objective: Vital signs in last 24 hours: Temp:  [98.5 F (36.9 C)-99.7 F (37.6 C)] 99.5 F (37.5 C) (04/15 1417) Pulse Rate:  [76-87] 86  (04/15 1417) Resp:  [16-18] 16  (04/15 1417) BP: (96-105)/(58-67) 105/65 mmHg (04/15 1417) SpO2:  [90 %-96 %] 93 % (04/15 1417) Weight:  [69.8 kg (153 lb 14.1 oz)] 69.8 kg (153 lb 14.1 oz) (04/15 0540) Weight change: 0.4 kg (14.1 oz) Last BM Date: 10/16/11 (Flexiseal drain)  Intake/Output from previous day: 04/14 0701 - 04/15 0700 In: 1671.5 [P.O.:400; I.V.:1267.5; IV Piggyback:4] Out: -  Total I/O In: 840 [P.O.:240; I.V.:600] Out: 700 [Emesis/NG output:200; Stool:500]   Physical Exam: General: Alert, awake able to answer simple questions. In no acute distress. HEENT: No bruits, no goiter. Heart: Regular rate and rhythm, without murmurs, rubs, gallops. Lungs: Clear to auscultation bilaterally. Abdomen: Soft, nontender, nondistended, positive bowel sounds. Flexiseal in place (so far 1.2 L output) Extremities: No clubbing, cyanosis or edema with positive pedal pulses. Neuro: Nonfocal.   Lab Results: Basic Metabolic Panel:  Basename 10/17/11 0415 10/16/11 0930  NA 138 141  K 3.8 2.7*  CL 109 108  CO2 20 25  GLUCOSE 122* 113*  BUN 6 9  CREATININE 0.67 0.67  CALCIUM 8.5 8.8  MG -- 1.8  PHOS -- --   CBC:  Basename 10/16/11 0527 10/15/11 0810  WBC 4.9 5.4  NEUTROABS -- --  HGB 8.9* 8.8*  HCT 30.8* 31.0*  MCV 91.1 93.9  PLT 317 308   Coagulation:  Basename 10/17/11 0415 10/16/11 0527  LABPROT 27.2* 22.0*  INR 2.47* 1.89*   Misc. Labs:  Recent Results (from the past 240 hour(s))  CLOSTRIDIUM DIFFICILE BY PCR     Status: Normal   Collection Time   10/13/11  4:07 PM      Component Value Range Status Comment   C difficile by pcr NEGATIVE   NEGATIVE  Final   STOOL CULTURE     Status: Normal   Collection Time   10/13/11  4:07 PM      Component Value Range Status Comment   Specimen Description STOOL   Final    Special Requests NONE   Final    Culture     Final    Value: NO SALMONELLA, SHIGELLA, CAMPYLOBACTER, OR YERSINIA ISOLATED     Note: REDUCED NORMAL FLORA PRESENT   Report Status 10/17/2011 FINAL   Final   CULTURE, BLOOD (ROUTINE X 2)     Status: Normal (Preliminary result)   Collection Time   10/13/11 10:10 PM      Component Value Range Status Comment   Specimen Description BLOOD RIGHT HAND   Final    Special Requests BOTTLES DRAWN AEROBIC AND ANAEROBIC 2.5ML   Final    Culture  Setup Time [id number]   Final    Culture     Final    Value:        BLOOD CULTURE RECEIVED NO GROWTH TO DATE CULTURE WILL BE HELD FOR 5 DAYS BEFORE ISSUING A FINAL NEGATIVE REPORT   Report Status PENDING   Incomplete   CULTURE, BLOOD (ROUTINE X 2)     Status: Normal (Preliminary result)   Collection Time   10/13/11 10:15 PM      Component Value Range Status Comment   Specimen  Description BLOOD LEFT ARM   Final    Special Requests BOTTLES DRAWN AEROBIC AND ANAEROBIC 2.5ML   Final    Culture  Setup Time [id number]   Final    Culture     Final    Value:        BLOOD CULTURE RECEIVED NO GROWTH TO DATE CULTURE WILL BE HELD FOR 5 DAYS BEFORE ISSUING A FINAL NEGATIVE REPORT   Report Status PENDING   Incomplete   MRSA PCR SCREENING     Status: Normal   Collection Time   10/14/11  3:34 AM      Component Value Range Status Comment   MRSA by PCR NEGATIVE  NEGATIVE  Final     Studies/Results: No results found.  Medications: Scheduled Meds:    . albuterol  2.5 mg Nebulization QID  . budesonide  0.5 mg Nebulization BID  . calcium-vitamin D  1 tablet Oral Daily  . diltiazem  180 mg Oral Daily  . ferrous sulfate  325 mg Oral Daily  . fluticasone  2 spray Each Nare BID  . furosemide  40 mg Oral Daily  . ipratropium  0.5 mg Nebulization QID    . loratadine  10 mg Oral Daily  . mulitivitamin with minerals  1 tablet Oral Daily  . potassium chloride  40 mEq Oral Q4H  . potassium chloride  40 mEq Oral Daily  . simvastatin  10 mg Oral QHS  . sodium chloride  3 mL Intravenous Q12H  . warfarin  4 mg Oral ONCE-1800  . warfarin  4 mg Oral ONCE-1800  . Warfarin - Pharmacist Dosing Inpatient   Does not apply q1800   Continuous Infusions:    . sodium chloride 75 mL/hr at 10/17/11 0545   PRN Meds:.acetaminophen, acetaminophen, albuterol, diphenoxylate-atropine, ondansetron (ZOFRAN) IV, ondansetron  Assessment/Plan: 1-Diarrhea: most likely 2/2 viral gastroenteritis. Continue supportive care, electrolytes repletion and follow symptoms. Patient w/o any more fever. Also with 1 episode of N/V; will use PRN antiemetics.   2-A-fib, rate controlled and on coumadin: will continue current regimen. Pharmacy to manage warfarin. Goal is a close as possible to 2.0  3-CVA (cerebral vascular accident): on coumadin now; no new deficit appreciated.   4-COPD: continue inhalers and nebulizer as per home regimen. Now off oxygen with good O2 sat on RA. No SOB and Clear lung fields.  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 Will transfuse iron IV to help producing more RBC's.   6-Allergic rhinitis, seasonal: continue fluticasone   7-Hypokalemia: 2/2 diuretics and diarrhea; repleted; will start maintenance dose.   8-Weakness generalized: 2/2 to mild dehydration and electrolytes disturbances; will continue supportive care and PT to improve deconditioning.  9-DVT: SCD's; already on coumadin.     LOS: 4 days   Dachelle Molzahn [facility] [phone]  10/17/2011, 5:06 PM

## 2011-10-17 NOTE — Progress Notes (Signed)
PT cancel note:  Pt refused to participate with therapy.  Per RN and family, pt was vomiting earlier in the day, but has been medicated.  Family did not want pt to ambulate.  Will check back on pt as schedule permits.    Thanks,  Clovia Cuff, PT

## 2011-10-18 LAB — CBC
Hemoglobin: 8.9 g/dL — ABNORMAL LOW (ref 12.0–15.0)
MCH: 26.6 pg (ref 26.0–34.0)
MCHC: 30.1 g/dL (ref 30.0–36.0)
MCV: 88.4 fL (ref 78.0–100.0)

## 2011-10-18 LAB — PROTIME-INR: Prothrombin Time: 32.4 seconds — ABNORMAL HIGH (ref 11.6–15.2)

## 2011-10-18 NOTE — Progress Notes (Signed)
Subjective: Patient slowly improving; flexiseal with just 150cc since yesterday night. No nausea, no vomiting and tolerating diet better (even intake still poor).  Objective: Vital signs in last 24 hours: Temp:  [98.7 F (37.1 C)-98.8 F (37.1 C)] 98.7 F (37.1 C) (04/16 1358) Pulse Rate:  [78-84] 78  (04/16 1358) Resp:  [16-18] 18  (04/16 1358) BP: (99-104)/(62-66) 102/66 mmHg (04/16 1358) SpO2:  [89 %-94 %] 92 % (04/16 1600) FiO2 (%):  [21 %] 21 % (04/16 0752) Weight:  [70.6 kg (155 lb 10.3 oz)] 70.6 kg (155 lb 10.3 oz) (04/16 0615) Weight change: 0.8 kg (1 lb 12.2 oz) Last BM Date: 10/17/11  Intake/Output from previous day: 04/15 0701 - 04/16 0700 In: 1380 [P.O.:480; I.V.:900] Out: 700 [Emesis/NG output:200; Stool:500] Total I/O In: 1080 [P.O.:480; I.V.:600] Out: -    Physical Exam: General: Alert, awake able to answer simple questions. In no acute distress. HEENT: No bruits, no goiter. Heart: Regular rate and rhythm, without murmurs, rubs, gallops. Lungs: Clear to auscultation bilaterally. Abdomen: Soft, nontender, nondistended, positive bowel sounds. Flexiseal in place (so far 1.2 L output) Extremities: No clubbing, cyanosis or edema with positive pedal pulses. Neuro: Nonfocal.   Lab Results: Basic Metabolic Panel:  Basename 10/17/11 0415 10/16/11 0930  NA 138 141  K 3.8 2.7*  CL 109 108  CO2 20 25  GLUCOSE 122* 113*  BUN 6 9  CREATININE 0.67 0.67  CALCIUM 8.5 8.8  MG -- 1.8  PHOS -- --   CBC:  Basename 10/18/11 0445 10/16/11 0527  WBC 6.3 4.9  NEUTROABS -- --  HGB 8.9* 8.9*  HCT 29.6* 30.8*  MCV 88.4 91.1  PLT 284 317   Coagulation:  Basename 10/18/11 0445 10/17/11 0415  LABPROT 32.4* 27.2*  INR 3.10* 2.47*   Misc. Labs:  Recent Results (from the past 240 hour(s))  CLOSTRIDIUM DIFFICILE BY PCR     Status: Normal   Collection Time   10/13/11  4:07 PM      Component Value Range Status Comment   C difficile by pcr NEGATIVE  NEGATIVE  Final    STOOL CULTURE     Status: Normal   Collection Time   10/13/11  4:07 PM      Component Value Range Status Comment   Specimen Description STOOL   Final    Special Requests NONE   Final    Culture     Final    Value: NO SALMONELLA, SHIGELLA, CAMPYLOBACTER, OR YERSINIA ISOLATED     Note: REDUCED NORMAL FLORA PRESENT   Report Status 10/17/2011 FINAL   Final   CULTURE, BLOOD (ROUTINE X 2)     Status: Normal (Preliminary result)   Collection Time   10/13/11 10:10 PM      Component Value Range Status Comment   Specimen Description BLOOD RIGHT HAND   Final    Special Requests BOTTLES DRAWN AEROBIC AND ANAEROBIC 2.5ML   Final    Culture  Setup Time [id number]   Final    Culture     Final    Value:        BLOOD CULTURE RECEIVED NO GROWTH TO DATE CULTURE WILL BE HELD FOR 5 DAYS BEFORE ISSUING A FINAL NEGATIVE REPORT   Report Status PENDING   Incomplete   CULTURE, BLOOD (ROUTINE X 2)     Status: Normal (Preliminary result)   Collection Time   10/13/11 10:15 PM      Component Value Range Status Comment   Specimen  Description BLOOD LEFT ARM   Final    Special Requests BOTTLES DRAWN AEROBIC AND ANAEROBIC 2.5ML   Final    Culture  Setup Time [id number]   Final    Culture     Final    Value:        BLOOD CULTURE RECEIVED NO GROWTH TO DATE CULTURE WILL BE HELD FOR 5 DAYS BEFORE ISSUING A FINAL NEGATIVE REPORT   Report Status PENDING   Incomplete   MRSA PCR SCREENING     Status: Normal   Collection Time   10/14/11  3:34 AM      Component Value Range Status Comment   MRSA by PCR NEGATIVE  NEGATIVE  Final     Studies/Results: No results found.  Medications: Scheduled Meds:    . albuterol  2.5 mg Nebulization QID  . budesonide  0.5 mg Nebulization BID  . calcium-vitamin D  1 tablet Oral Daily  . diltiazem  180 mg Oral Daily  . ferric gluconate (FERRLECIT/NULECIT) IV  125 mg Intravenous Once  . ferrous sulfate  325 mg Oral Daily  . fluticasone  2 spray Each Nare BID  . furosemide  40 mg  Oral Daily  . ipratropium  0.5 mg Nebulization QID  . loratadine  10 mg Oral Daily  . mulitivitamin with minerals  1 tablet Oral Daily  . potassium chloride  40 mEq Oral Daily  . simvastatin  10 mg Oral QHS  . sodium chloride  3 mL Intravenous Q12H  . Warfarin - Pharmacist Dosing Inpatient   Does not apply q1800   Continuous Infusions:    . sodium chloride 75 mL/hr at 10/18/11 1500   PRN Meds:.acetaminophen, acetaminophen, albuterol, diphenoxylate-atropine, ondansetron (ZOFRAN) IV, ondansetron  Assessment/Plan: 1-Diarrhea: most likely 2/2 viral gastroenteritis. Continue supportive care, electrolytes repletion and follow symptoms. Patient w/o any more fever. Also now w/o N/V and with just 150 cc in the flexiseal for the last 18 hours. Will discontinue flexiseal and continue encouraging patient to eat. PRN lomitil and antiemetics.   2-A-fib, rate controlled and on coumadin: will continue current regimen. Pharmacy to manage warfarin. Goal is a close as possible to 2.0  3-CVA (cerebral vascular accident): on coumadin now; no new deficit appreciated.   4-COPD: continue inhalers and nebulizer as per home regimen. Now off oxygen with good O2 sat on RA. No SOB and Clear lung fields.  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 Iron infusion given on 10-17-11. Will transfuse if less than 8.   6-Allergic rhinitis, seasonal: continue fluticasone   7-Hypokalemia: 2/2 diuretics and diarrhea; repleted; will start maintenance dose.   8-Weakness generalized: 2/2 to mild dehydration and electrolytes disturbances; will continue supportive care and PT to improve deconditioning. Patient might need short term SNF vs HHPT; will follow recommendations from PT and OT.  9-DVT: SCD's; already on coumadin.      LOS: 5 days   Eyden Dobie [facility] [phone]  10/18/2011, 5:48 PM

## 2011-10-18 NOTE — Progress Notes (Signed)
ANTICOAGULATION CONSULT NOTE - Follow Up Consult  Pharmacy Consult for Warfarin Indication: A-Fib  Allergies  Allergen Reactions  . Amoxicillin Hives  . Penicillins Hives    Patient Measurements: Height: 5' (152.4 cm) Weight: 155 lb 10.3 oz (70.6 kg) IBW/kg (Calculated) : 45.5   Vital Signs: Temp: 98.8 Angelica [name] (37.1 C) (04/16 0615) Temp src: Oral (04/16 0615) BP: 99/62 mmHg (04/16 0615) Pulse Rate: 83  (04/16 0615)  Labs:  Basename 10/18/11 0445 10/17/11 0415 10/16/11 0930 10/16/11 0527  HGB 8.9* -- -- 8.9*  HCT 29.6* -- -- 30.8*  PLT 284 -- -- 317  APTT -- -- -- --  LABPROT Angelica.4* 27.2* -- 22.0*  INR 3.10* 2.47* -- 1.89*  HEPARINUNFRC -- -- -- --  CREATININE -- 0.67 0.67 --  CKTOTAL -- -- -- --  CKMB -- -- -- --  TROPONINI -- -- -- --   Estimated Creatinine Clearance: 44.2 ml/min (by C-G formula based on Cr of 0.67).   Medications:  Scheduled:     . albuterol  2.5 mg Nebulization QID  . budesonide  0.5 mg Nebulization BID  . calcium-vitamin D  1 tablet Oral Daily  . diltiazem  180 mg Oral Daily  . ferric gluconate (FERRLECIT/NULECIT) IV  125 mg Intravenous Once  . ferrous sulfate  325 mg Oral Daily  . fluticasone  2 spray Each Nare BID  . furosemide  40 mg Oral Daily  . ipratropium  0.5 mg Nebulization QID  . loratadine  10 mg Oral Daily  . mulitivitamin with minerals  1 tablet Oral Daily  . potassium chloride  40 mEq Oral Daily  . simvastatin  10 mg Oral QHS  . sodium chloride  3 mL Intravenous Q12H  . warfarin  4 mg Oral ONCE-1800  . Warfarin - Pharmacist Dosing Inpatient   Does not apply q1800   Infusions:     . sodium chloride 75 mL/hr at 10/18/11 0531   PRN: acetaminophen, acetaminophen, albuterol, diphenoxylate-atropine, ondansetron (ZOFRAN) IV, ondansetron  Assessment:  78 yo Angelica [name] presented to ER 4/11 with increased weakness, N/V, and wheezing. On chronic warfarin for hx of Afib.   Home dose reportedly 6mg  daily except 4mg  on Tues/Th/Sat.  Of  note, patient has had an AVM and GI bleed and instructions are to keep INR as close as possible to 2 per MD.   No bleeding events reported in chart.  CBC low but stable.  INR jumped overnight on home doses of warfarin.  Thought that lower dose last night would have brought INR down so this is unexpected.  No drug-drug interactions with warfarin that would have caused this increase.  Most likely a result of patient's vomiting/diarrhea and decreased oral intake.  Goal of Therapy:  INR = 2   Plan:  1) No warfarin today. 2) Angelica [name]/U daily INR trend 3) Attempt to maintain INR ~ 2 4) Watch for S/Sx GI bleed. - call MD if this occurs  Clance Boll, PharmD, BCPS Pager: [phone] 10/18/2011 9:35 AM

## 2011-10-18 NOTE — Progress Notes (Signed)
PT Cancellation Note  Treatment cancelled today due to patient's refusal to participate.  Attempted 3 times today to assist patient with walk and/ore exercise.  She refused due to just out of bed, then just back to bed, then family present.  Feel she is reluctant due to fecal incontinence with flexiseal.  Will continue attempts.  Pavan Bring,CYNDI 10/18/2011, 4:28 PM

## 2011-10-18 NOTE — Progress Notes (Signed)
   CARE MANAGEMENT NOTE 10/18/2011  Patient:  Angelica [name], Angelica [name]   Account Number:  [id number]  Date Initiated:  10/14/2011  Documentation initiated by:  DAVIS,RHONDA  Subjective/Objective Assessment:   pt with gi bleed and INR greater than 2.0 on admission, on coumadin for atrial fib at home,     Action/Plan:   lives at home with family   Anticipated DC Date:  10/20/2011   Anticipated DC Plan:  HOME/SELF CARE  In-house referral  NA      DC Planning Services  NA      [facility] Choice  NA   Choice offered to / List presented to:  NA   DME arranged  NA      DME agency  NA     HH arranged  NA      HH agency  NA   Status of service:  In process, will continue to follow Medicare Important Message given?  YES (If response is "NO", the following Medicare IM given date fields will be blank) Date Medicare IM given:  10/13/2011 Date Additional Medicare IM given:    Discharge Disposition:    Per UR Regulation:  Reviewed for med. necessity/level of care/duration of stay  If discussed at Long Length of Stay Meetings, dates discussed:    Comments:  10/18/11 [facility] RN,BSN NCM [phone] FLEXISEAL IN PLACE.  [id number] Davis,RN,BSN,CCM

## 2011-10-19 LAB — PROTIME-INR: Prothrombin Time: 22.6 seconds — ABNORMAL HIGH (ref 11.6–15.2)

## 2011-10-19 LAB — CBC
HCT: 27.9 % — ABNORMAL LOW (ref 36.0–46.0)
Hemoglobin: 8.6 g/dL — ABNORMAL LOW (ref 12.0–15.0)
MCH: 26.6 pg (ref 26.0–34.0)
MCHC: 30.8 g/dL (ref 30.0–36.0)
MCV: 86.4 fL (ref 78.0–100.0)
RBC: 3.23 MIL/uL — ABNORMAL LOW (ref 3.87–5.11)

## 2011-10-19 LAB — BASIC METABOLIC PANEL
BUN: 6 mg/dL (ref 6–23)
CO2: 24 mEq/L (ref 19–32)
Calcium: 8.8 mg/dL (ref 8.4–10.5)
Creatinine, Ser: 0.61 mg/dL (ref 0.50–1.10)
GFR calc non Af Amer: 80 mL/min — ABNORMAL LOW (ref >90)
Glucose, Bld: 91 mg/dL (ref 70–99)
Sodium: 135 mEq/L (ref 135–145)

## 2011-10-19 MED ORDER — WARFARIN SODIUM 2.5 MG PO TABS
2.5000 mg | ORAL_TABLET | Freq: Once | ORAL | Status: AC
  Administered 2011-10-19: 2.5 mg via ORAL
  Filled 2011-10-19: qty 1

## 2011-10-19 MED ORDER — POTASSIUM CHLORIDE CRYS ER 20 MEQ PO TBCR
40.0000 meq | EXTENDED_RELEASE_TABLET | ORAL | Status: AC
  Administered 2011-10-19 (×2): 40 meq via ORAL
  Filled 2011-10-19 (×2): qty 2

## 2011-10-19 NOTE — Progress Notes (Signed)
ANTICOAGULATION CONSULT NOTE - Follow Up Consult  Pharmacy Consult for Warfarin Indication: A-Fib  Allergies  Allergen Reactions  . Amoxicillin Hives  . Penicillins Hives    Patient Measurements: Height: 5' (152.4 cm) Weight: 156 lb 15.5 oz (71.2 kg) IBW/kg (Calculated) : 45.5   Vital Signs: Temp: 98.3 F (36.8 C) (04/17 0627) Temp src: Oral (04/17 0627) BP: 113/73 mmHg (04/17 0627) Pulse Rate: 70  (04/17 0627)  Labs:  Basename 10/19/11 0425 10/18/11 0445 10/17/11 0415  HGB 8.6* 8.9* --  HCT 27.9* 29.6* --  PLT 341 284 --  APTT -- -- --  LABPROT 22.6* 32.4* 27.2*  INR 1.95* 3.10* 2.47*  HEPARINUNFRC -- -- --  CREATININE 0.61 -- 0.67  CKTOTAL -- -- --  CKMB -- -- --  TROPONINI -- -- --   Estimated Creatinine Clearance: 44.5 ml/min (by C-G formula based on Cr of 0.61).   Medications:  Scheduled:     . albuterol  2.5 mg Nebulization QID  . budesonide  0.5 mg Nebulization BID  . calcium-vitamin D  1 tablet Oral Daily  . diltiazem  180 mg Oral Daily  . ferrous sulfate  325 mg Oral Daily  . fluticasone  2 spray Each Nare BID  . furosemide  40 mg Oral Daily  . ipratropium  0.5 mg Nebulization QID  . loratadine  10 mg Oral Daily  . mulitivitamin with minerals  1 tablet Oral Daily  . potassium chloride  40 mEq Oral Daily  . potassium chloride  40 mEq Oral Q2H  . simvastatin  10 mg Oral QHS  . sodium chloride  3 mL Intravenous Q12H  . Warfarin - Pharmacist Dosing Inpatient   Does not apply q1800   Infusions:     . sodium chloride 75 mL/hr at 10/19/11 0938   PRN: acetaminophen, acetaminophen, albuterol, diphenoxylate-atropine, ondansetron (ZOFRAN) IV, ondansetron  Assessment:  78 yo F presented to ER 4/11 with increased weakness, N/V, and wheezing. On chronic warfarin for hx of Afib.   Home dose reportedly 6mg  daily except 4mg  on Tues/Th/Sat.  Of note, patient has had an AVM and GI bleed and instructions are to keep INR as close as possible to 2 per MD.     No bleeding events reported in chart.  CBC low but stable.  INR now near goal after holding dose last night.  Will restart at lower dose today.  Goal of Therapy:  INR = 2   Plan:  1) Warfarin 2.5mg  po once today. 2) F/U daily INR trend 3) Attempt to maintain INR ~ 2 4) Watch for S/Sx GI bleed. - call MD if this occurs  Clance Boll, PharmD, BCPS Pager: [phone] 10/19/2011 11:06 AM

## 2011-10-19 NOTE — Progress Notes (Signed)
Subjective: Spoke with Patient's husband and daughter who feel that she will be able to go home with home health services.   Interval History: Pt had one formed stool today and has not had any diarrheal stool since yesterday. Pt ate well today.   Objective: Filed Vitals:   10/18/11 2236 10/19/11 0627 10/19/11 0805 10/19/11 1440  BP:  113/73  103/70  Pulse:  70  70  Temp:  98.3 F (36.8 C)  98.8 F (37.1 C)  TempSrc:  Oral  Oral  Resp:  20  18  Height:      Weight:  71.2 kg (156 lb 15.5 oz)    SpO2: 94% 94% 94% 94%   Weight change: 0.6 kg (1 lb 5.2 oz)  Intake/Output Summary (Last 24 hours) at 10/19/11 1540 Last data filed at 10/19/11 1300  Gross per 24 hour  Intake   1700 ml  Output      0 ml  Net   1700 ml    General: Alert, awake, oriented x3, in no acute distress.  HEENT: [place]/AT PEERL, EOMI Neck: Trachea midline,  no masses, no thyromegal,y no JVD, no carotid bruit OROPHARYNX:  Moist, No exudate/ erythema/lesions.  Heart: Regular rate and rhythm, without murmurs, rubs, gallops, PMI non-displaced, no heaves or thrills on palpation.  Lungs: Clear to auscultation, no wheezing or rhonchi noted. No increased vocal fremitus resonant to percussion  Abdomen: Soft, nontender, nondistended, positive bowel sounds, no masses no hepatosplenomegaly noted..  Neuro: No focal neurological deficits noted cranial nerves II through XII grossly intact. Musculoskeletal: No warm swelling or erythema around joints, no spinal tenderness noted.   Lab Results:  Basename 10/19/11 0425 10/17/11 0415  NA 135 138  K 3.2* 3.8  CL 103 109  CO2 24 20  GLUCOSE 91 122*  BUN 6 6  CREATININE 0.61 0.67  CALCIUM 8.8 8.5  MG 1.6 --  PHOS -- --   No results found for this basename: AST:2,ALT:2,ALKPHOS:2,BILITOT:2,PROT:2,ALBUMIN:2 in the last 72 hours No results found for this basename: LIPASE:2,AMYLASE:2 in the last 72 hours  Basename 10/19/11 0425 10/18/11 0445  WBC 6.0 6.3  NEUTROABS -- --  HGB  8.6* 8.9*  HCT 27.9* 29.6*  MCV 86.4 88.4  PLT 341 284   No results found for this basename: CKTOTAL:3,CKMB:3,CKMBINDEX:3,TROPONINI:3 in the last 72 hours No components found with this basename: POCBNP:3 No results found for this basename: DDIMER:2 in the last 72 hours No results found for this basename: HGBA1C:2 in the last 72 hours No results found for this basename: CHOL:2,HDL:2,LDLCALC:2,TRIG:2,CHOLHDL:2,LDLDIRECT:2 in the last 72 hours No results found for this basename: TSH,T4TOTAL,FREET3,T3FREE,THYROIDAB in the last 72 hours No results found for this basename: VITAMINB12:2,FOLATE:2,FERRITIN:2,TIBC:2,IRON:2,RETICCTPCT:2 in the last 72 hours  Micro Results: Recent Results (from the past 240 hour(s))  CLOSTRIDIUM DIFFICILE BY PCR     Status: Normal   Collection Time   10/13/11  4:07 PM      Component Value Range Status Comment   C difficile by pcr NEGATIVE  NEGATIVE  Final   STOOL CULTURE     Status: Normal   Collection Time   10/13/11  4:07 PM      Component Value Range Status Comment   Specimen Description STOOL   Final    Special Requests NONE   Final    Culture     Final    Value: NO SALMONELLA, SHIGELLA, CAMPYLOBACTER, OR YERSINIA ISOLATED     Note: REDUCED NORMAL FLORA PRESENT   Report Status 10/17/2011  FINAL   Final   CULTURE, BLOOD (ROUTINE X 2)     Status: Normal (Preliminary result)   Collection Time   10/13/11 10:10 PM      Component Value Range Status Comment   Specimen Description BLOOD RIGHT HAND   Final    Special Requests BOTTLES DRAWN AEROBIC AND ANAEROBIC 2.5ML   Final    Culture  Setup Time [id number]   Final    Culture     Final    Value:        BLOOD CULTURE RECEIVED NO GROWTH TO DATE CULTURE WILL BE HELD FOR 5 DAYS BEFORE ISSUING A FINAL NEGATIVE REPORT   Report Status PENDING   Incomplete   CULTURE, BLOOD (ROUTINE X 2)     Status: Normal (Preliminary result)   Collection Time   10/13/11 10:15 PM      Component Value Range Status Comment   Specimen  Description BLOOD LEFT ARM   Final    Special Requests BOTTLES DRAWN AEROBIC AND ANAEROBIC 2.5ML   Final    Culture  Setup Time [id number]   Final    Culture     Final    Value:        BLOOD CULTURE RECEIVED NO GROWTH TO DATE CULTURE WILL BE HELD FOR 5 DAYS BEFORE ISSUING A FINAL NEGATIVE REPORT   Report Status PENDING   Incomplete   MRSA PCR SCREENING     Status: Normal   Collection Time   10/14/11  3:34 AM      Component Value Range Status Comment   MRSA by PCR NEGATIVE  NEGATIVE  Final     Studies/Results: Dg Chest 2 View  10/13/2011  *RADIOLOGY REPORT*  Clinical Data: Rectal bleeding, weakness.  CHEST - 2 VIEW  Comparison: 09/18/2011  Findings: Cardiomegaly.  Vascular congestion and peribronchial thickening.  Slight interstitial prominence is stable.  Scarring or atelectasis in the perihilar regions bilaterally.  No effusions. No acute bony abnormality.  IMPRESSION: Cardiomegaly, vascular congestion.  Bronchitic changes.  Perihilar scarring or atelectasis.  Original Report Authenticated By: Cyndie Chime, M.D.    Medications: I have reviewed the patient's current medications. Scheduled Meds:   . albuterol  2.5 mg Nebulization QID  . budesonide  0.5 mg Nebulization BID  . calcium-vitamin D  1 tablet Oral Daily  . diltiazem  180 mg Oral Daily  . ferrous sulfate  325 mg Oral Daily  . fluticasone  2 spray Each Nare BID  . furosemide  40 mg Oral Daily  . ipratropium  0.5 mg Nebulization QID  . loratadine  10 mg Oral Daily  . mulitivitamin with minerals  1 tablet Oral Daily  . potassium chloride  40 mEq Oral Daily  . potassium chloride  40 mEq Oral Q2H  . simvastatin  10 mg Oral QHS  . sodium chloride  3 mL Intravenous Q12H  . warfarin  2.5 mg Oral ONCE-1800  . Warfarin - Pharmacist Dosing Inpatient   Does not apply q1800   Continuous Infusions:   . sodium chloride 75 mL/hr at 10/19/11 0938   PRN Meds:.acetaminophen, acetaminophen, albuterol, diphenoxylate-atropine,  ondansetron (ZOFRAN) IV, ondansetron Assessment/Plan: Patient Active Hospital Problem List: Diarrhea (10/13/2011)   Assessment: This appears to be resolving and patient has had a formed stool today    A-fib, rate controlled on coumadin (05/10/2011)   Assessment: Patient essentially therapeutic on Coumadin. Dose to keep her as close possible to 2.0 and we will observe the patient for any  GI bleeding. Heart rate well controlled in the 70's.   H/O CVA (cerebral vascular accident) (05/10/2011)   Assessment: Patient has no residual appreciable weakness.     COPD  (06/14/2011)   Assessment: Quiescent     Iron deficiency anemia secondary to blood loss (chronic) (07/11/2011)   Assessment: Hemoglobin stable     Allergic rhinitis, seasonal (09/20/2011)   Assessment: Quiescent    Hypokalemia (10/13/2011)   Assessment: Repleted orally    Weakness generalized (10/13/2011)   Assessment: Physical therapy to see patient and make recommendations however patient will likely go home with home health therapy.     Disposition: Anticipate discharge tomorrow   LOS: 6 days

## 2011-10-19 NOTE — Progress Notes (Signed)
OT Note:  Pt had fallen asleep during breakfast.  She states that she slept poorly with many interruptions last night.  May be willing to participate in OT eval later.  Will check back if schedule permits.  [place], [place] [phone] 10/19/2011

## 2011-10-19 NOTE — Progress Notes (Signed)
Spoke with Durene Cal from Physical Therapy regarding PT working with patient in the morning in anticipation of the patient being discharged tomorrow.  She was putting it in the system so patient will be seen in the morning.  Youa Deloney, Joslyn Devon

## 2011-10-19 NOTE — Progress Notes (Signed)
   CARE MANAGEMENT NOTE 10/19/2011  Patient:  Angelica [name], Angelica [name]   Account Number:  [id number]  Date Initiated:  10/14/2011  Documentation initiated by:  DAVIS,RHONDA  Subjective/Objective Assessment:   pt with gi bleed and INR greater than 2.0 on admission, on coumadin for atrial fib at home,     Action/Plan:   lives at home with family   Anticipated DC Date:  10/20/2011   Anticipated DC Plan:  HOME W HOME HEALTH SERVICES  In-house referral  NA      DC Planning Services  NA      [facility] Choice  NA   Choice offered to / List presented to:  NA   DME arranged  NA      DME agency  NA     HH arranged  NA      HH agency  NA   Status of service:  In process, will continue to follow Medicare Important Message given?  YES (If response is "NO", the following Medicare IM given date fields will be blank) Date Medicare IM given:  10/13/2011 Date Additional Medicare IM given:    Discharge Disposition:    Per UR Regulation:  Reviewed for med. necessity/level of care/duration of stay  If discussed at Long Length of Stay Meetings, dates discussed:    Comments:  10/19/11 [facility] RN,BSNNCM [phone] FLEXISEAL D/C.PROVIDED PATIENT W/HHC AGENCY LIST. PT TO REATTEMPT EVAL.IF HOME W/HH,WILL NEED HH ORDERS, & F2F.  10/18/11 Ramon Brant RN,BSN NCM [phone] FLEXISEAL IN PLACE.  [id number] Davis,RN,BSN,CCM

## 2011-10-20 LAB — CULTURE, BLOOD (ROUTINE X 2)
Culture  Setup Time: 201304120124
Culture  Setup Time: 201304120124
Culture: NO GROWTH
Culture: NO GROWTH

## 2011-10-20 LAB — BASIC METABOLIC PANEL
BUN: 5 mg/dL — ABNORMAL LOW (ref 6–23)
CO2: 27 mEq/L (ref 19–32)
Chloride: 106 mEq/L (ref 96–112)
Creatinine, Ser: 0.7 mg/dL (ref 0.50–1.10)
GFR calc Af Amer: 88 mL/min — ABNORMAL LOW (ref >90)
Glucose, Bld: 91 mg/dL (ref 70–99)
Potassium: 3.8 mEq/L (ref 3.5–5.1)

## 2011-10-20 MED ORDER — DIPHENOXYLATE-ATROPINE 2.5-0.025 MG PO TABS: 1.0000 | ORAL_TABLET | Freq: Four times a day (QID) | ORAL | Status: AC | PRN

## 2011-10-20 MED ORDER — WARFARIN SODIUM 5 MG PO TABS
5.0000 mg | ORAL_TABLET | Freq: Once | ORAL | Status: AC
  Administered 2011-10-20: 5 mg via ORAL
  Filled 2011-10-20: qty 1

## 2011-10-20 MED ORDER — POTASSIUM CHLORIDE CRYS ER 20 MEQ PO TBCR: 40.0000 meq | EXTENDED_RELEASE_TABLET | Freq: Every day | ORAL | Status: DC

## 2011-10-20 NOTE — Progress Notes (Signed)
ANTICOAGULATION CONSULT NOTE - Follow Up Consult  Pharmacy Consult for Warfarin Indication: A-Fib  Allergies  Allergen Reactions  . Amoxicillin Hives  . Penicillins Hives    Patient Measurements: Height: 5' (152.4 cm) Weight: 155 lb 3.3 oz (70.4 kg) IBW/kg (Calculated) : 45.5   Vital Signs: Temp: 97.8 F (36.6 C) (04/18 0514) Temp src: Oral (04/18 0514) BP: 115/66 mmHg (04/18 0514) Pulse Rate: 66  (04/18 0514)  Labs:  Basename 10/20/11 0442 10/19/11 0425 10/18/11 0445  HGB 9.3* 8.6* --  HCT 30.8* 27.9* 29.6*  PLT -- 341 284  APTT -- -- --  LABPROT 16.5* 22.6* 32.4*  INR 1.31 1.95* 3.10*  HEPARINUNFRC -- -- --  CREATININE 0.70 0.61 --  CKTOTAL -- -- --  CKMB -- -- --  TROPONINI -- -- --   Estimated Creatinine Clearance: 44.2 ml/min (by C-G formula based on Cr of 0.7).  Assessment:  78 yo F on chronic warfarin for hx of Afib.   Home dose reportedly 6mg  daily except 4mg  on Tues/Th/Sat.  Of note, patient has had an AVM and GI bleed and instructions are to keep INR as close as possible to 2 per MD.   No bleeding events reported in chart.  CBC low but stable.  INR dropped down to 1.31 today given no coumadin on 4/16.  MD noted in chart yesterday for possible discharge today.  Goal of Therapy:  INR = 2   Plan:   Warfarin 5 mg po x 1 tonight  If discharge today, recommend resume home regimen of 6mg  daily except 4mg  on Tues/Th/Sat.  Daily PT/INR, attempt to maintain INR ~ 2, watch for S/Sx GI bleed. - call MD if this occurs  Geoffry Paradise, PharmD.   Pager:  [phone] 8:33 AM

## 2011-10-20 NOTE — Progress Notes (Signed)
   CARE MANAGEMENT NOTE 10/20/2011  Patient:  Angelica [name], Angelica [name]   Account Number:  [id number]  Date Initiated:  10/14/2011  Documentation initiated by:  DAVIS,RHONDA  Subjective/Objective Assessment:   pt with gi bleed and INR greater than 2.0 on admission, on coumadin for atrial fib at home,     Action/Plan:   lives at home with family   Anticipated DC Date:  10/20/2011   Anticipated DC Plan:  HOME W HOME HEALTH SERVICES  In-house referral  NA      DC Planning Services  NA      [facility] Choice  NA   Choice offered to / List presented to:  C-1 Patient   DME arranged  NA      DME agency  NA     HH arranged  HH-2 PT  HH-1 RN  HH-3 OT      HH agency  [facility]   Status of service:  Completed, signed off Medicare Important Message given?  YES (If response is "NO", the following Medicare IM given date fields will be blank) Date Medicare IM given:  10/13/2011 Date Additional Medicare IM given:    Discharge Disposition:  HOME W HOME HEALTH SERVICES  Per UR Regulation:  Reviewed for med. necessity/level of care/duration of stay  If discussed at Long Length of Stay Meetings, dates discussed:    Comments:  10/20/11 Angelica Lona RN,BSN NCM [phone] NOTED PER MD HHRN-PT/INR IN AM.[facility] UPDATED. SPOKE TO DTR LINDA [phone], ABOUT D/C PLANS.AGREE [facility] HH FOR PT ONLY.PRIVATE SITTER LIST PROVIDED AS RESOURCE FOR 24HR SUPV IF NEEDED.WILL NEED F2F.  10/19/11 Angelica Antigua RN,BSNNCM [phone] FLEXISEAL D/C.PROVIDED PATIENT W/HHC AGENCY LIST. PT TO REATTEMPT EVAL.IF HOME W/HH,WILL NEED HH ORDERS, & F2F.  10/18/11 Angelica Lindahl RN,BSN NCM [phone] FLEXISEAL IN PLACE.  [id number] Davis,RN,BSN,CCM

## 2011-10-20 NOTE — Progress Notes (Signed)
Angelica [name] MRN: [id number] DOB/AGE: 04-May-1923 78 y.o.  Admit date: 10/13/2011 Discharge date: 10/20/2011  Primary Care Physician:  Thayer Headings, MD, MD   Discharge Diagnoses:   Patient Active Problem List  Diagnoses  . A-fib, rate controlled on coumadin  . CVA (cerebral vascular accident)  . CHF (congestive heart failure)  . COPD   . 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 Pneumonia  . Anemia due to GI blood loss  . Iron deficiency anemia secondary to blood loss (chronic)  . Arteriovenous malformation of gastrointestinal tract  . Allergic rhinitis, seasonal  . Diarrhea  . Hypokalemia  . Weakness generalized    DISCHARGE MEDICATION: Medication List  As of 10/20/2011 10:42 AM   STOP taking these medications         sulfamethoxazole-trimethoprim 800-160 MG per tablet         TAKE these medications         albuterol (2.5 MG/3ML) 0.083% nebulizer solution   Commonly known as: PROVENTIL   Take 2.5 mg by nebulization 4 (four) times daily.      alendronate 70 MG tablet   Commonly known as: FOSAMAX   Take 70 mg by mouth every Tuesday. Take with a full glass of water on an empty stomach.      budesonide 0.5 MG/2ML nebulizer solution   Commonly known as: PULMICORT   Take 0.5 mg by nebulization 2 (two) times daily.      CALCIUM + D PO   Take 1 tablet by mouth daily.      diltiazem 180 MG 24 hr capsule   Commonly known as: DILACOR XR   Take 180 mg by mouth daily.      diphenoxylate-atropine 2.5-0.025 MG per tablet   Commonly known as: LOMOTIL   Take 1 tablet by mouth 4 (four) times daily as needed for diarrhea or loose stools.      ferrous sulfate 325 (65 FE) MG tablet   Take 325 mg by mouth daily.      fexofenadine 180 MG tablet   Commonly known as: ALLEGRA   Take 180 mg by mouth daily.      fluticasone 50 MCG/ACT nasal spray   Commonly known as: FLONASE   Place 2 sprays into the nose 2 (two) times daily.      furosemide 40 MG tablet   Commonly known as: LASIX   Take  60 mg by mouth daily.      loratadine 10 MG tablet   Commonly known as: CLARITIN   Take 1 tablet (10 mg total) by mouth daily.      multivitamins ther. w/minerals Tabs   Take 1 tablet by mouth daily.      potassium chloride SA 20 MEQ tablet   Commonly known as: K-DUR,KLOR-CON   Take 2 tablets (40 mEq total) by mouth daily.      simvastatin 10 MG tablet   Commonly known as: ZOCOR   Take 10 mg by mouth at bedtime.      Vitamin D 2000 UNITS Caps   Take 1 capsule by mouth daily.      warfarin 4 MG tablet   Commonly known as: COUMADIN   Take 4-6 mg by mouth See admin instructions. She takes one tablet (4mg ) on Tuesday, Thursday Saturday and one and a half tablets (6mg ) on Sunday, Monday, Wednesday, and Friday. (INR MUST BE KEPT AT 2 OR PATIENT WILL HAVE A GI BLEED PER FAMILY)            Consults:  SIGNIFICANT DIAGNOSTIC STUDIES:  Dg Chest 2 View  10/13/2011  *RADIOLOGY REPORT*  Clinical Data: Rectal bleeding, weakness.  CHEST - 2 VIEW  Comparison: 09/18/2011  Findings: Cardiomegaly.  Vascular congestion and peribronchial thickening.  Slight interstitial prominence is stable.  Scarring or atelectasis in the perihilar regions bilaterally.  No effusions. No acute bony abnormality.  IMPRESSION: Cardiomegaly, vascular congestion.  Bronchitic changes.  Perihilar scarring or atelectasis.  Original Report Authenticated By: Cyndie Chime, M.D.      Recent Results (from the past 240 hour(s))  CLOSTRIDIUM DIFFICILE BY PCR     Status: Normal   Collection Time   10/13/11  4:07 PM      Component Value Range Status Comment   C difficile by pcr NEGATIVE  NEGATIVE  Final   STOOL CULTURE     Status: Normal   Collection Time   10/13/11  4:07 PM      Component Value Range Status Comment   Specimen Description STOOL   Final    Special Requests NONE   Final    Culture     Final    Value: NO SALMONELLA, SHIGELLA, CAMPYLOBACTER, OR YERSINIA ISOLATED     Note: REDUCED NORMAL FLORA PRESENT    Report Status 10/17/2011 FINAL   Final   CULTURE, BLOOD (ROUTINE X 2)     Status: Normal (Preliminary result)   Collection Time   10/13/11 10:10 PM      Component Value Range Status Comment   Specimen Description BLOOD RIGHT HAND   Final    Special Requests BOTTLES DRAWN AEROBIC AND ANAEROBIC 2.5ML   Final    Culture  Setup Time [id number]   Final    Culture     Final    Value:        BLOOD CULTURE RECEIVED NO GROWTH TO DATE CULTURE WILL BE HELD FOR 5 DAYS BEFORE ISSUING A FINAL NEGATIVE REPORT   Report Status PENDING   Incomplete   CULTURE, BLOOD (ROUTINE X 2)     Status: Normal (Preliminary result)   Collection Time   10/13/11 10:15 PM      Component Value Range Status Comment   Specimen Description BLOOD LEFT ARM   Final    Special Requests BOTTLES DRAWN AEROBIC AND ANAEROBIC 2.5ML   Final    Culture  Setup Time [id number]   Final    Culture     Final    Value:        BLOOD CULTURE RECEIVED NO GROWTH TO DATE CULTURE WILL BE HELD FOR 5 DAYS BEFORE ISSUING A FINAL NEGATIVE REPORT   Report Status PENDING   Incomplete   MRSA PCR SCREENING     Status: Normal   Collection Time   10/14/11  3:34 AM      Component Value Range Status Comment   MRSA by PCR NEGATIVE  NEGATIVE  Final     BRIEF ADMITTING H & P: 78 y/o female with pmh of HTN, AF (on coumadin), hx of AVM and GI bleed in the past; iron deficiency anemia, COPD and RLE cellulitis treated with 1 week of bactrim recently; was brought to ED secondary to increased weakness, lethargy and diarrhea. Patient daughter also documented some Non bloody N/V 2-3 days PTA.  This morning, daughter notice a large amount of diarrhea both in bed and on the bathroom floor. Stool appears dark, but states patient takes iron supplementation. Daughter states her Coumadin level was 2.0 which was done at the office yesterday.  Per patient, she denies headache, lightheadedness, fever, chest pain, shortness of breath, abdominal pain.  Workup in ED showed  positive FOBT (which is not new on the patient); hypokalemia, mild dehydration and some wheezing.       Hospital Course:  Present on Admission:  .Diarrhea: Pt had work up which showed c.diff negative. Etiology of diarrhea unclear but felt to be viral gastroenteritis. Pt was started on Lomotil and had resolution of diarrhea. She has had formed stool in the last 24 hours.  .Hypokalemia: Felt to be secondary to GI losses. Repleted  .Weakness generalized: Secondary to dehydration and deconditioning. Pt will receive HH PT.   .Iron deficiency anemia secondary to blood loss (chronic): Continue Iron replacement. There was some question as to an acute blood loss based on pt's report of character of stool. However no blood loss was observed here in the hospital and Hb remained stable.  .A-fib, rate controlled on coumadin: In light of the concern for chronic blood loss, the goal of INR is 2.0. Continue home regimen of Coumadin.   Disposition and Follow-up:  F/U with Dr. Shary Decamp in 1 week. Check Coumadin levels tomorrow and report to Dr. Ronne Binning.   Discharge Orders    Future Appointments: Provider: Department: Dept Phone: Center:   10/26/2011 11:00 AM [facility] [phone] None   10/26/2011 12:00 PM Leslye Peer, MD [facility] [phone] None   01/09/2012 10:15 AM Delcie Roch [facility] [phone] None   01/09/2012 10:45 AM Rana Snare, NP [facility] [phone] None     Future Orders Please Complete By Expires   Diet - low sodium heart healthy      Increase activity slowly         DISCHARGE EXAM:  General: Alert, awake, oriented x3, in no acute distress.  Vital Signs:Blood pressure 119/72, pulse 79, temperature 98.5 F (36.9 C), temperature source Oral, resp. rate 16, height 5' (1.524 m), weight 70.4 kg (155 lb 3.3 oz), SpO2 96.00%.  HEENT: [place]/AT PEERL, EOMI  Neck: Trachea midline, no masses, no thyromegal,y no JVD, no carotid bruit   OROPHARYNX: Moist, No exudate/ erythema/lesions.  Heart: Regular rate and rhythm, without murmurs, rubs, gallops, PMI non-displaced, no heaves or thrills on palpation.  Lungs: Clear to auscultation, no wheezing or rhonchi noted. No increased vocal fremitus resonant to percussion  Abdomen: Soft, nontender, nondistended, positive bowel sounds, no masses no hepatosplenomegaly noted..  Neuro: No focal neurological deficits noted cranial nerves II through XII grossly intact.  Musculoskeletal: No warm swelling or erythema around joints, no spinal tenderness noted.   Basename 10/20/11 0442 10/19/11 0425  NA 139 135  K 3.8 3.2*  CL 106 103  CO2 27 24  GLUCOSE 91 91  BUN 5* 6  CREATININE 0.70 0.61  CALCIUM 9.4 8.8  MG -- 1.6  PHOS -- --   No results found for this basename: AST:2,ALT:2,ALKPHOS:2,BILITOT:2,PROT:2,ALBUMIN:2 in the last 72 hours No results found for this basename: LIPASE:2,AMYLASE:2 in the last 72 hours  Basename 10/20/11 0442 10/19/11 0425 10/18/11 0445  WBC -- 6.0 6.3  NEUTROABS -- -- --  HGB 9.3* 8.6* --  HCT 30.8* 27.9* --  MCV -- 86.4 88.4  PLT -- 341 284   Total time for discharge process including face to face time approximately 40 minutes. Signed: Caidance Sybert A. 10/20/2011, 10:42 AM

## 2011-10-20 NOTE — Progress Notes (Signed)
   CARE MANAGEMENT NOTE 10/20/2011  Patient:  Angelica [name], Angelica [name]   Account Number:  [id number]  Date Initiated:  10/14/2011  Documentation initiated by:  DAVIS,RHONDA  Subjective/Objective Assessment:   pt with gi bleed and INR greater than 2.0 on admission, on coumadin for atrial fib at home,     Action/Plan:   lives at home with family   Anticipated DC Date:  10/20/2011   Anticipated DC Plan:  HOME W HOME HEALTH SERVICES  In-house referral  NA      DC Planning Services  NA      [facility] Choice  NA   Choice offered to / List presented to:  C-1 Patient   DME arranged  NA      DME agency  NA     HH arranged  HH-2 PT      HH agency  [facility]   Status of service:  Completed, signed off Medicare Important Message given?  YES (If response is "NO", the following Medicare IM given date fields will be blank) Date Medicare IM given:  10/13/2011 Date Additional Medicare IM given:    Discharge Disposition:  HOME W HOME HEALTH SERVICES  Per UR Regulation:  Reviewed for med. necessity/level of care/duration of stay  If discussed at Long Length of Stay Meetings, dates discussed:    Comments:  10/20/11 Treyden Hakim RN,BSN NCM [phone] SPOKE TO DTR LINDA G#[phone], ABOUT D/C PLANS.AGREE [facility] HH FOR PT ONLY.PRIVATE SITTER LIST PROVIDED AS RESOURCE FOR 24HR SUPV IF NEEDED.WILL NEED F2F.  10/19/11 Arnav Cregg RN,BSNNCM [phone] F[facility]AL D/C.PROVIDED PATIENT W/HHC AGENCY LIST. PT TO REATTEMPT EVAL.IF HOME W/HH,WILL NEED HH ORDERS, & F2F.  10/18/11 Edwardine Deschepper RN,BSN NCM [phone] FLEXISEAL IN PLACE.  [id number] Davis,RN,BSN,CCM

## 2011-10-20 NOTE — Plan of Care (Signed)
Problem: Problem: Cardiovascular Progression Goal: NO ARRHYTHMIAS Outcome: Not Applicable Date Met:  10/20/11 Pt has chronic A-fib rate controlled.

## 2011-10-20 NOTE — Evaluation (Signed)
Occupational Therapy Evaluation Patient Details Name: Angelica [name] MRN: [id number] DOB: 1923-04-27 Today's Date: 10/20/2011  Problem List:  Patient Active Problem List  Diagnoses  . A-fib, rate controlled on coumadin  . CVA (cerebral vascular accident)  . CHF (congestive heart failure)  . COPD   . 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 Pneumonia  . Anemia due to GI blood loss  . Iron deficiency anemia secondary to blood loss (chronic)  . Arteriovenous malformation of gastrointestinal tract  . Allergic rhinitis, seasonal  . Diarrhea  . Hypokalemia  . Weakness generalized    Past Medical History:  Past Medical History  Diagnosis Date  . Atrial fibrillation   . Stroke   . Hypertension   . Arthritis   . GERD (gastroesophageal reflux disease)   . COPD (chronic obstructive pulmonary disease)   . PNA (pneumonia)   . Anemia due to GI blood loss 07/11/2011  . 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 Arteriovenous malformation of gastrointestinal tract 07/11/2011   Past Surgical History:  Past Surgical History  Procedure Date  . Hip fracture surgery   . Cholecystectomy     OT Assessment/Plan/Recommendation OT Assessment Clinical Impression Statement: Pt is an 78 yo female who initially presented to hospital with diarrhea. Pt has significant cognitive impairments and would require 24/7 supervision at home. Skilled OT recommended to maximize I w/BADLs to supervision level in prep for d/c home with HHOT. OT Recommendation/Assessment: Patient will need skilled OT in the acute care venue OT Problem List: Decreased activity tolerance;Decreased safety awareness;Decreased cognition;Decreased knowledge of use of DME or AE;Impaired balance (sitting and/or standing) OT Therapy Diagnosis : Generalized weakness OT Plan OT Frequency: Min 2X/week OT Treatment/Interventions: Self-care/ADL training;Therapeutic activities;DME and/or AE instruction;Patient/family education;Balance  training OT Recommendation Follow Up Recommendations: Home Health OT with 24/7 supervision Equipment Recommended: None recommended by OT Individuals Consulted Consulted and Agree with Results and Recommendations: Patient unable/family or caregiver not available OT Goals Acute Rehab OT Goals OT Goal Formulation: Patient unable to participate in goal setting Time For Goal Achievement: 2 weeks ADL Goals Pt Will Perform Grooming: with supervision;Standing at sink (X 3 tasks to improve standing activity tolerance.) ADL Goal: Grooming - Progress: Goal set today Pt Will Perform Lower Body Bathing: with supervision;Sit to stand from chair;Sit to stand from bed ADL Goal: Lower Body Bathing - Progress: Goal set today Pt Will Perform Lower Body Dressing: with supervision;Sit to stand from bed;Sit to stand from chair ADL Goal: Lower Body Dressing - Progress: Goal set today Pt Will Transfer to Toilet: with supervision;Ambulation;Regular height toilet ADL Goal: Toilet Transfer - Progress: Goal set today Pt Will Perform Toileting - Clothing Manipulation: with supervision;Standing ADL Goal: Toileting - Clothing Manipulation - Progress: Goal set today Pt Will Perform Toileting - Hygiene: with supervision;Sit to stand from 3-in-1/toilet ADL Goal: Toileting - Hygiene - Progress: Goal set today  OT Evaluation Precautions/Restrictions  Precautions Precautions: Fall Precaution Comments: stool (diarrhea) and urinary incontinence Restrictions Weight Bearing Restrictions: No Prior Functioning Home Living Lives With: Spouse Available Help at Discharge: Family Type of Home: House Home Access: Level entry Home Layout: One level Bathroom Shower/Tub: Health visitor: Standard Home Adaptive Equipment: Walker - rolling;Straight cane;Shower chair with back;Grab bars around toilet Additional Comments: Pt is an unreliable historian and no family was present during eval. Prior Function Level of  Independence: Independent with assistive device(s)  ADL ADL Grooming: Performed;Wash/dry hands;Minimal assistance Where Assessed - Grooming: Standing at sink Upper Body Bathing: Simulated;Set  up Where Assessed - Upper Body Bathing: Sitting, bed;Unsupported Lower Body Bathing: Minimal assistance;Simulated Where Assessed - Lower Body Bathing: Sit to stand from bed Upper Body Dressing: Simulated;Set up Where Assessed - Upper Body Dressing: Sitting, bed;Unsupported Lower Body Dressing: Performed;Set up Where Assessed - Lower Body Dressing: Sitting, bed;Unsupported (for socks.) Toilet Transfer: Performed;Minimal assistance Toilet Transfer Method: Proofreader: Regular height toilet;Grab bars Toileting - Clothing Manipulation: Performed;Minimal assistance Where Assessed - Toileting Clothing Manipulation: Sit to stand from 3-in-1 or toilet Toileting - Hygiene: Performed;Minimal assistance Where Assessed - Toileting Hygiene: Sit on 3-in-1 or toilet Tub/Shower Transfer: Not assessed Tub/Shower Transfer Method: Not assessed Equipment Used: Rolling walker Ambulation Related to ADLs: Pt ambulated to the bathroom w/minimal VCs for safety. Vision/Perception    Cognition Cognition Overall Cognitive Status: No family/caregiver present to determine baseline cognitive functioning Area of Impairment: Memory;Attention;Awareness of errors;Problem solving;Executive functioning Arousal/Alertness: Awake/alert Orientation Level: Disoriented to;Time;Situation Behavior During Session: WFL for tasks performed Current Attention Level: Sustained Memory: Decreased recall of precautions Following Commands: Follows one step commands consistently Awareness of Errors: Assistance required to identify errors made;Assistance required to correct errors made Sensation/Coordination   Extremity Assessment RUE Assessment RUE Assessment: Within Functional Limits LUE Assessment LUE Assessment:  Within Functional Limits Mobility  Bed Mobility Bed Mobility: Yes Supine to Sit: 6: Modified independent (Device/Increase time) Supine to Sit Details (indicate cue type and reason): Increased time.  Sitting - Scoot to Edge of Bed: 5: Supervision Sitting - Scoot to [place] of Bed Details (indicate cue type and reason): Cues and increased time.  Transfers Sit to Stand: 4: Min assist;From bed;With upper extremity assist Sit to Stand Details (indicate cue type and reason): Assist to rise. VCs safety. Stand to Sit: To chair/3-in-1;With armrests;With upper extremity assist Stand to Sit Details: Min-guard assist. VCs safety.  Exercises   End of Session OT - End of Session Activity Tolerance: Patient tolerated treatment well Patient left: in chair;with call bell in reach General Behavior During Session: [facility] for tasks performed Cognition: Impaired, at baseline   Tiamarie Furnari A, OTR/L [phone] 10/20/2011, 11:37 AM

## 2011-10-20 NOTE — Progress Notes (Signed)
Physical Therapy Treatment Patient Details Name: Angelica [name] MRN: [id number] DOB: [birth year] Today's Date: 10/20/2011  PT Assessment/Plan  PT - Assessment/Plan Comments on Treatment Session: Pt with plans to d/c home today.  PT Plan: Discharge plan remains appropriate Follow Up Recommendations: Home health PT;Supervision/Assistance - 24 hour PT Goals  Acute Rehab PT Goals PT Goal: Supine/Side to Sit - Progress: Met PT Goal: Sit to Stand - Progress: Progressing toward goal PT Goal: Stand - Progress: Progressing toward goal PT Goal: Ambulate - Progress: Progressing toward goal  PT Treatment Precautions/Restrictions  Precautions Precautions: Fall Precaution Comments: stool (diarrhea) and urinary incontinence Restrictions Weight Bearing Restrictions: No Mobility (including Balance) Bed Mobility Bed Mobility: Yes Supine to Sit: 6: Modified independent (Device/Increase time) Supine to Sit Details (indicate cue type and reason): Increased time.  Sitting - Scoot to Edge of Bed: 5: Supervision Sitting - Scoot to [place] of Bed Details (indicate cue type and reason): Cues and increased time.  Transfers Transfers: Yes Sit to Stand: 4: Min assist;From bed;With upper extremity assist Sit to Stand Details (indicate cue type and reason): Assist to rise. VCs safety. Stand to Sit: To chair/3-in-1;With armrests;With upper extremity assist Stand to Sit Details: Min-guard assist. VCs safety.  Ambulation/Gait Ambulation/Gait Assistance: 4: Min assist Ambulation/Gait Assistance Details (indicate cue type and reason): Assist to stabilize intermittently. VCs safety, postue. narrow BOS Ambulation Distance (Feet): 175 Feet Assistive device: Rolling walker Gait Pattern: Step-through pattern;Trunk flexed    Exercise    End of Session PT - End of Session Equipment Utilized During Treatment: Gait belt Activity Tolerance: Patient tolerated treatment well Patient left: in chair;with call bell in  reach (notified RN that pt was up in chair) General Behavior During Session: [facility] for tasks performed Cognition: Impaired, at baseline  Rebeca Alert [facility] 10/20/2011, 9:52 AM [phone]

## 2011-10-21 ENCOUNTER — Ambulatory Visit: Payer: Self-pay | Admitting: Pharmacist

## 2011-10-21 DIAGNOSIS — I4891 Unspecified atrial fibrillation: Secondary | ICD-10-CM

## 2011-10-21 DIAGNOSIS — I639 Cerebral infarction, unspecified: Secondary | ICD-10-CM

## 2011-10-21 LAB — POCT INR: INR: 1.2

## 2011-10-21 NOTE — Progress Notes (Signed)
Pt discharged yesterday from [facility].  She took 5 mg of Coumadin yesterday but today is back on maintenance dose of 6 mg/day; 4 mg Tu/Th/Sat per [name], pts dtr. INR = 1.2 today drawn by [facility]. I have s/w Sheryl Ward, LPN w/ [facility] (ph# [phone]; fax # [phone]) & we will manage Coumadin again at this point rather than Dr. Arlys John McKenzie's office. I have s/w Kennon Rounds, CNA at Dr. Dimas Millin office (ph# [phone]) since their office closed today at noon, she will notify on call MD that we will resume anticoag management unless their office objects.  I also faxed same info to attn Dr. Ronne Binning or Virgina Evener (Coumadin Clinic Pharmacist at [facility] Cont maintenance dose for now & recheck INR w/ [facility] on 10/26/11.  They will fax [place] the results & we'll need to call [name] w/ results/instructions. Bonita Quin gave me permission to call her for the next week at her cell # [phone].  Bonita Quin is aware of plan. Marily Lente, Pharm.D.

## 2011-10-26 ENCOUNTER — Telehealth: Payer: Self-pay

## 2011-10-26 ENCOUNTER — Ambulatory Visit (HOSPITAL_BASED_OUTPATIENT_CLINIC_OR_DEPARTMENT_OTHER): Payer: Medicare Other | Admitting: Pharmacist

## 2011-10-26 ENCOUNTER — Ambulatory Visit: Payer: Medicare Other | Admitting: Emergency Medicine

## 2011-10-26 DIAGNOSIS — I6350 Cerebral infarction due to unspecified occlusion or stenosis of unspecified cerebral artery: Secondary | ICD-10-CM

## 2011-10-26 DIAGNOSIS — I639 Cerebral infarction, unspecified: Secondary | ICD-10-CM

## 2011-10-26 DIAGNOSIS — I4891 Unspecified atrial fibrillation: Secondary | ICD-10-CM

## 2011-10-26 LAB — POCT INR: INR: 1.6

## 2011-10-26 NOTE — Patient Instructions (Signed)
Continue current dose: 6mg  daily except 4mg  on TuTHuSat. Recheck INR in ~1 week with [place] services.

## 2011-10-26 NOTE — Progress Notes (Signed)
Patient has been on this dose for less than 1 week. Current dose started on 10/21/11 Continue current dose: 6mg  daily except 4mg  on TuTHuSat. Recheck INR in ~1 week with [place] services.

## 2011-10-26 NOTE — Telephone Encounter (Signed)
Received call from pt's daughter Bonita Quin notifying Dr Cyndie Chime that pt was admitted "for a week for gastroenteritis."  Bonita Quin wanted Dr Cyndie Chime to be aware pt did receive an "iron infusion" while admitted.  Per pt's chart, it appears pt received Nulecit - Ferric Gluconate 125mg  IV on 10/17/11.   Bonita Quin wanted Dr Cyndie Chime to be aware and questions if pt will need any additional f/u.  Pt is scheduled for lab/LCT on 7/8.  dph

## 2011-10-27 ENCOUNTER — Telehealth: Payer: Self-pay

## 2011-10-27 NOTE — Telephone Encounter (Signed)
Per [facility] Dr Cyndie Chime - orders added for additional labs at pt's next visit.   Verbal orders given to [facility] at [place] for CBC, Iron Panel, Ferritin.  Kay reads back instructions and verbalizes understanding.   Pt's daughter, Bonita Quin notified of new lab orders.  Verbalizes understanding and agreement. dph

## 2011-11-02 ENCOUNTER — Ambulatory Visit (HOSPITAL_BASED_OUTPATIENT_CLINIC_OR_DEPARTMENT_OTHER): Payer: Medicare Other | Admitting: Pharmacist

## 2011-11-02 DIAGNOSIS — I639 Cerebral infarction, unspecified: Secondary | ICD-10-CM

## 2011-11-02 DIAGNOSIS — I4891 Unspecified atrial fibrillation: Secondary | ICD-10-CM

## 2011-11-02 NOTE — Progress Notes (Signed)
INR = 1.8 on 6 mg/day; 4 mg Tu/Th/Sat. I s/w pts dtr today & there are no problems to report. Cont same Coumadin dose. Recheck INR in 3 weeks w/ [facility]. Faxed instruction to [facility] & s/w [facility] Ward, LPN over phone. Marily Lente, Pharm.D.

## 2011-11-17 ENCOUNTER — Ambulatory Visit (INDEPENDENT_AMBULATORY_CARE_PROVIDER_SITE_OTHER): Payer: Medicare Other | Admitting: Emergency Medicine

## 2011-11-17 ENCOUNTER — Encounter: Payer: Self-pay | Admitting: Emergency Medicine

## 2011-11-17 VITALS — BP 102/64 | HR 92 | Temp 98.3°F | Ht 64.0 in | Wt 156.4 lb

## 2011-11-17 DIAGNOSIS — J309 Allergic rhinitis, unspecified: Secondary | ICD-10-CM

## 2011-11-17 DIAGNOSIS — J449 Chronic obstructive pulmonary disease, unspecified: Secondary | ICD-10-CM

## 2011-11-17 DIAGNOSIS — J302 Other seasonal allergic rhinitis: Secondary | ICD-10-CM

## 2011-11-17 MED ORDER — AZELASTINE HCL 0.1 % NA SOLN: 2.0000 | Freq: Two times a day (BID) | NASAL | Status: DC

## 2011-11-17 NOTE — Assessment & Plan Note (Signed)
Unclear that this is a true dx, will stop pulmicort and continue albuterol for now. May d/c after we have done PFT. Plan PFT next time

## 2011-11-17 NOTE — Patient Instructions (Signed)
Please continue your fluticasone nasal spray, 2 sprays each nostril twice a day Continue loratadine and stop allegra Stop budesonide nebulizer You may continue to use the albuterol nebulizer as needed Start Astelin nasal spray, 2 sprays each nostril twice a day Follow with Dr Delton Coombes in 2 months with full PFT on the same day

## 2011-11-17 NOTE — Assessment & Plan Note (Signed)
Continue the loratadine, stop allegra Continue fluticasone spray bid Add astelin bid rov 2 mon

## 2011-11-17 NOTE — Progress Notes (Signed)
Subjective:    Patient ID: Angelica [name], female    DOB: [birth year], 78 y.o.   MRN: [id number] HPI 78 yo woman, never smoker, hx of HTN, A Fib on coumadin, CAD with hx CHF (Dr Rennis Golden), CVA, dementia, allergies formerly on shots now seen at [facility] and sensitive to ragweed. Admitted x 2 12/12 for L PNA +/- pulm edema. Also with ? Dx asthma +/- COPD diagnosed recently clinically. She has had PFT remotely in [place]. Was started budesonide + albuterol for 4+ yrs, recently started on Spiriva without much clinical response. No aspiration sx. No GERD symptoms on a PPI.   ROV 11/17/11 -- pleasant 78 yo woman, hx HTN, A Fib, CAD/CHF. Has syndrome consistent w sinus drainage and UA/lower airways disease. Given dx COPD/asthma although unclear. Last time we started loratadine, fluticasone, unable to do [place].  Remains on pulmicort nebs + albuterol nebs. We stopped spiriva. Had planned for PFT today but unable to do due to some recent sinonasal gtt and disease.    Having nasal gtt and congestion, wheeze, cough especially at night. Has had at least 3 -4 discrete episodes with hospitalizations. Quickly gets better with prednisone    Review of Systems  Constitutional: Negative.  Negative for fever and unexpected weight change.  HENT: Positive for congestion and sneezing. Negative for ear pain, nosebleeds, sore throat, rhinorrhea, trouble swallowing, dental problem, postnasal drip and sinus pressure.   Eyes: Negative.  Negative for redness and itching.  Respiratory: Positive for cough and shortness of breath. Negative for chest tightness and wheezing.   Cardiovascular: Positive for chest pain and palpitations. Negative for leg swelling.  Gastrointestinal: Negative.  Negative for nausea and vomiting.  Genitourinary: Negative.  Negative for dysuria.  Musculoskeletal: Positive for arthralgias. Negative for joint swelling.  Skin: Negative.  Negative for rash.  Neurological: Negative.  Negative for headaches.    Hematological: Negative.  Does not bruise/bleed easily.  Psychiatric/Behavioral: Negative.  Negative for dysphoric mood. The patient is not nervous/anxious.      TTE 06/15/11:  Study Conclusions  - Left ventricle: The cavity size was normal. Wall thickness was increased in a pattern of mild LVH. Systolic function was normal. The estimated ejection fraction was in the range of 55% to 60%. Wall motion was normal; there were no regional wall motion abnormalities. - Mitral valve: Calcified annulus. - Left atrium: The atrium was moderately dilated. - Right atrium: The atrium was moderately dilated. - Tricuspid valve: Moderate regurgitation. - Pulmonary arteries: Systolic pressure was mildly increased. PA peak pressure: 39mm Hg (S). Impressions:  - Oscillating density in RV on parasternal short axis views most likely papillary muscle       Objective:   Physical Exam  Gen: Pleasant, well-nourished, in no distress,  Poor hx giver - her daughter answers the questions  ENT: No lesions,  mouth clear,  oropharynx clear, no postnasal drip  Neck: No JVD, no TMG, no carotid bruits  Lungs: No use of accessory muscles, no dullness to percussion, clear without rales or rhonchi  Cardiovascular: RRR, heart sounds normal, no murmur or gallops, trace LE edema  Musculoskeletal: No deformities, no cyanosis or clubbing  Neuro: alert, non focal  Skin: Warm, no lesions or rashes       Assessment & Plan:  COPD  Unclear that this is a true dx, will stop pulmicort and continue albuterol for now. May d/c after we have done PFT. Plan PFT next time  Allergic rhinitis, seasonal Continue the loratadine, stop allegra  Continue fluticasone spray bid Add astelin bid rov 2 mon

## 2011-11-21 ENCOUNTER — Encounter: Payer: Self-pay | Admitting: Pharmacist

## 2011-11-21 ENCOUNTER — Telehealth: Payer: Self-pay | Admitting: Pharmacist

## 2011-11-21 NOTE — Progress Notes (Signed)
Expected INR result today from [place] but did not receive fax.  Called Sheryl Ward at [place] & she informed me that pt was discharged from RN care as of 11/18/11 due to "met goal".  They cannot continue to go see pt just for INR's since the lab is not a billable service for them. I called pts dtr & left vm's for her to call us so we can schedule her mother at [facility]. Marily Lente, Pharm.D.

## 2011-11-23 ENCOUNTER — Other Ambulatory Visit (HOSPITAL_BASED_OUTPATIENT_CLINIC_OR_DEPARTMENT_OTHER): Payer: Medicare Other | Admitting: Lab

## 2011-11-23 DIAGNOSIS — I4891 Unspecified atrial fibrillation: Secondary | ICD-10-CM

## 2011-11-23 NOTE — Discharge Summary (Signed)
Angelica [name] MRN: [id number] DOB/AGE: 76/08/1922 76 y.o.  Admit date: 10/13/2011 Discharge date: 10/20/2011  Primary Care Physician:  MACKENZIE,BRIAN, MD, MD   Discharge Diagnoses:   Patient Active Problem List  Diagnoses  . A-fib, rate controlled on coumadin  . CVA (cerebral vascular accident)  . CHF (congestive heart failure)  . COPD   . 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 Pneumonia  . Anemia due to GI blood loss  . Iron deficiency anemia secondary to blood loss (chronic)  . Arteriovenous malformation of gastrointestinal tract  . Allergic rhinitis, seasonal  . Diarrhea  . Hypokalemia  . Weakness generalized    DISCHARGE MEDICATION: Medication List  As of 10/20/2011 10:42 AM   STOP taking these medications         sulfamethoxazole-trimethoprim 800-160 MG per tablet         TAKE these medications         albuterol (2.5 MG/3ML) 0.083% nebulizer solution   Commonly known as: PROVENTIL   Take 2.5 mg by nebulization 4 (four) times daily.      alendronate 70 MG tablet   Commonly known as: FOSAMAX   Take 70 mg by mouth every Tuesday. Take with a full glass of water on an empty stomach.      budesonide 0.5 MG/2ML nebulizer solution   Commonly known as: PULMICORT   Take 0.5 mg by nebulization 2 (two) times daily.      CALCIUM + D PO   Take 1 tablet by mouth daily.      diltiazem 180 MG 24 hr capsule   Commonly known as: DILACOR XR   Take 180 mg by mouth daily.      diphenoxylate-atropine 2.5-0.025 MG per tablet   Commonly known as: LOMOTIL   Take 1 tablet by mouth 4 (four) times daily as needed for diarrhea or loose stools.      ferrous sulfate 325 (65 FE) MG tablet   Take 325 mg by mouth daily.      fexofenadine 180 MG tablet   Commonly known as: ALLEGRA   Take 180 mg by mouth daily.      fluticasone 50 MCG/ACT nasal spray   Commonly known as: FLONASE   Place 2 sprays into the nose 2 (two) times daily.      furosemide 40 MG tablet   Commonly known as: LASIX   Take  60 mg by mouth daily.      loratadine 10 MG tablet   Commonly known as: CLARITIN   Take 1 tablet (10 mg total) by mouth daily.      multivitamins ther. w/minerals Tabs   Take 1 tablet by mouth daily.      potassium chloride SA 20 MEQ tablet   Commonly known as: K-DUR,KLOR-CON   Take 2 tablets (40 mEq total) by mouth daily.      simvastatin 10 MG tablet   Commonly known as: ZOCOR   Take 10 mg by mouth at bedtime.      Vitamin D 2000 UNITS Caps   Take 1 capsule by mouth daily.      warfarin 4 MG tablet   Commonly known as: COUMADIN   Take 4-6 mg by mouth See admin instructions. She takes one tablet (4mg) on Tuesday, Thursday Saturday and one and a half tablets (6mg) on Sunday, Monday, Wednesday, and Friday. (INR MUST BE KEPT AT 2 OR PATIENT WILL HAVE A GI BLEED PER FAMILY)            Consults:       SIGNIFICANT DIAGNOSTIC STUDIES:  Dg Chest 2 View  10/13/2011  *RADIOLOGY REPORT*  Clinical Data: Rectal bleeding, weakness.  CHEST - 2 VIEW  Comparison: 09/18/2011  Findings: Cardiomegaly.  Vascular congestion and peribronchial thickening.  Slight interstitial prominence is stable.  Scarring or atelectasis in the perihilar regions bilaterally.  No effusions. No acute bony abnormality.  IMPRESSION: Cardiomegaly, vascular congestion.  Bronchitic changes.  Perihilar scarring or atelectasis.  Original Report Authenticated By: KEVIN G. DOVER, M.D.      Recent Results (from the past 240 hour(s))  CLOSTRIDIUM DIFFICILE BY PCR     Status: Normal   Collection Time   10/13/11  4:07 PM      Component Value Range Status Comment   C difficile by pcr NEGATIVE  NEGATIVE  Final   STOOL CULTURE     Status: Normal   Collection Time   10/13/11  4:07 PM      Component Value Range Status Comment   Specimen Description STOOL   Final    Special Requests NONE   Final    Culture     Final    Value: NO SALMONELLA, SHIGELLA, CAMPYLOBACTER, OR YERSINIA ISOLATED     Note: REDUCED NORMAL FLORA PRESENT    Report Status 10/17/2011 FINAL   Final   CULTURE, BLOOD (ROUTINE X 2)     Status: Normal (Preliminary result)   Collection Time   10/13/11 10:10 PM      Component Value Range Status Comment   Specimen Description BLOOD RIGHT HAND   Final    Special Requests BOTTLES DRAWN AEROBIC AND ANAEROBIC 2.5ML   Final    Culture  Setup Time [id number]   Final    Culture     Final    Value:        BLOOD CULTURE RECEIVED NO GROWTH TO DATE CULTURE WILL BE HELD FOR 5 DAYS BEFORE ISSUING A FINAL NEGATIVE REPORT   Report Status PENDING   Incomplete   CULTURE, BLOOD (ROUTINE X 2)     Status: Normal (Preliminary result)   Collection Time   10/13/11 10:15 PM      Component Value Range Status Comment   Specimen Description BLOOD LEFT ARM   Final    Special Requests BOTTLES DRAWN AEROBIC AND ANAEROBIC 2.5ML   Final    Culture  Setup Time [id number]   Final    Culture     Final    Value:        BLOOD CULTURE RECEIVED NO GROWTH TO DATE CULTURE WILL BE HELD FOR 5 DAYS BEFORE ISSUING A FINAL NEGATIVE REPORT   Report Status PENDING   Incomplete   MRSA PCR SCREENING     Status: Normal   Collection Time   10/14/11  3:34 AM      Component Value Range Status Comment   MRSA by PCR NEGATIVE  NEGATIVE  Final     BRIEF ADMITTING H & P: 78 y/o female with pmh of HTN, AF (on coumadin), hx of AVM and GI bleed in the past; iron deficiency anemia, COPD and RLE cellulitis treated with 1 week of bactrim recently; was brought to ED secondary to increased weakness, lethargy and diarrhea. Patient daughter also documented some Non bloody N/V 2-3 days PTA.  This morning, daughter notice a large amount of diarrhea both in bed and on the bathroom floor. Stool appears dark, but states patient takes iron supplementation. Daughter states her Coumadin level was 2.0 which was done at the office yesterday.   Per patient, she denies headache, lightheadedness, fever, chest pain, shortness of breath, abdominal pain.  Workup in ED showed  positive FOBT (which is not new on the patient); hypokalemia, mild dehydration and some wheezing.       Hospital Course:  Present on Admission:  .Diarrhea: Pt had work up which showed c.diff negative. Etiology of diarrhea unclear but felt to be viral gastroenteritis. Pt was started on Lomotil and had resolution of diarrhea. She has had formed stool in the last 24 hours.  .Hypokalemia: Felt to be secondary to GI losses. Repleted  .Weakness generalized: Secondary to dehydration and deconditioning. Pt will receive HH PT.   .Iron deficiency anemia secondary to blood loss (chronic): Continue Iron replacement. There was some question as to an acute blood loss based on pt's report of character of stool. However no blood loss was observed here in the hospital and Hb remained stable.  .A-fib, rate controlled on coumadin: In light of the concern for chronic blood loss, the goal of INR is 2.0. Continue home regimen of Coumadin.   Disposition and Follow-up:  F/U with Dr. Brian McKenzie in 1 week. Check Coumadin levels tomorrow and report to Dr. McKenzie.   Discharge Orders    Future Appointments: Provider: Department: Dept Phone: Center:   10/26/2011 11:00 AM [facility] [phone] None   10/26/2011 12:00 PM Robert S Byrum, MD [facility] [phone] None   01/09/2012 10:15 AM Harold D Shumate [facility] [phone] None   01/09/2012 10:45 AM Lisa K Thomas, NP [facility] [phone] None     Future Orders Please Complete By Expires   Diet - low sodium heart healthy      Increase activity slowly         DISCHARGE EXAM:  General: Alert, awake, oriented x3, in no acute distress.  Vital Signs:Blood pressure 119/72, pulse 79, temperature 98.5 F (36.9 C), temperature source Oral, resp. rate 16, height 5' (1.524 m), weight 70.4 kg (155 lb 3.3 oz), SpO2 96.00%.  HEENT: [place]/AT PEERL, EOMI  Neck: Trachea midline, no masses, no thyromegal,y no JVD, no carotid bruit   OROPHARYNX: Moist, No exudate/ erythema/lesions.  Heart: Regular rate and rhythm, without murmurs, rubs, gallops, PMI non-displaced, no heaves or thrills on palpation.  Lungs: Clear to auscultation, no wheezing or rhonchi noted. No increased vocal fremitus resonant to percussion  Abdomen: Soft, nontender, nondistended, positive bowel sounds, no masses no hepatosplenomegaly noted..  Neuro: No focal neurological deficits noted cranial nerves II through XII grossly intact.  Musculoskeletal: No warm swelling or erythema around joints, no spinal tenderness noted.   Basename 10/20/11 0442 10/19/11 0425  NA 139 135  K 3.8 3.2*  CL 106 103  CO2 27 24  GLUCOSE 91 91  BUN 5* 6  CREATININE 0.70 0.61  CALCIUM 9.4 8.8  MG -- 1.6  PHOS -- --   No results found for this basename: AST:2,ALT:2,ALKPHOS:2,BILITOT:2,PROT:2,ALBUMIN:2 in the last 72 hours No results found for this basename: LIPASE:2,AMYLASE:2 in the last 72 hours  Basename 10/20/11 0442 10/19/11 0425 10/18/11 0445  WBC -- 6.0 6.3  NEUTROABS -- -- --  HGB 9.3* 8.6* --  HCT 30.8* 27.9* --  MCV -- 86.4 88.4  PLT -- 341 284   Total time for discharge process including face to face time approximately 40 minutes. Signed: Bird Tailor A. 10/20/2011, 10:42 AM    106  103   CO2  27  24   GLUCOSE  91  91   BUN  5*  6   CREATININE  0.70  0.61   CALCIUM  9.4  8.8   MG  --  1.6   PHOS  --  --    No results found for this basename: AST:2,ALT:2,ALKPHOS:2,BILITOT:2,PROT:2,ALBUMIN:2 in the last 72 hours  No results found for this basename: LIPASE:2,AMYLASE:2 in the last 72 hours   Basename  10/20/11 0442  10/19/11 0425  10/18/11 0445   WBC  --  6.0  6.3   NEUTROABS  --  --  --   HGB  9.3*  8.6*  --   HCT  30.8*  27.9*  --   MCV  --  86.4  88.4   PLT  --  341  284    Total time for discharge process including face to face time approximately 40 minutes.  Signed:  Milen Lengacher A.  10/20/2011, 10:42 AM

## 2011-11-25 ENCOUNTER — Ambulatory Visit (HOSPITAL_BASED_OUTPATIENT_CLINIC_OR_DEPARTMENT_OTHER): Payer: Medicare Other | Admitting: Pharmacist

## 2011-11-25 DIAGNOSIS — I6350 Cerebral infarction due to unspecified occlusion or stenosis of unspecified cerebral artery: Secondary | ICD-10-CM

## 2011-11-25 DIAGNOSIS — I4891 Unspecified atrial fibrillation: Secondary | ICD-10-CM

## 2011-11-25 DIAGNOSIS — I639 Cerebral infarction, unspecified: Secondary | ICD-10-CM

## 2011-11-25 NOTE — Progress Notes (Signed)
Spoke with [name], pt's daughter, by phone.  No reports of bleeding or bruising.  Reviewed changes to her medication profile, and medlist updated in Epic.  No missed doses.  Will continue current dose of 6mg  daily except 4mg  on Tues, Thurs, Sat.  Recheck INR in ~3 weeks.

## 2011-12-20 ENCOUNTER — Telehealth: Payer: Self-pay | Admitting: Pharmacist

## 2011-12-20 ENCOUNTER — Other Ambulatory Visit: Payer: Medicare Other | Admitting: Lab

## 2011-12-20 NOTE — Telephone Encounter (Signed)
Left message with pt daughter, [name]. Asked to reschedule lab appointment from 12/20/11. [name] cell = [phone] She can call us back at [phone] or [phone].

## 2011-12-27 ENCOUNTER — Other Ambulatory Visit (HOSPITAL_BASED_OUTPATIENT_CLINIC_OR_DEPARTMENT_OTHER): Payer: Medicare Other | Admitting: Lab

## 2011-12-27 ENCOUNTER — Telehealth: Payer: Self-pay | Admitting: Pharmacist

## 2011-12-27 DIAGNOSIS — I4891 Unspecified atrial fibrillation: Secondary | ICD-10-CM

## 2011-12-27 LAB — PROTIME-INR (CHCC SATELLITE): INR: 1.2 — ABNORMAL LOW (ref 2.0–3.5)

## 2011-12-27 LAB — POCT INR: INR: 1.2

## 2011-12-28 ENCOUNTER — Ambulatory Visit: Payer: Self-pay | Admitting: Pharmacist

## 2011-12-28 ENCOUNTER — Telehealth: Payer: Self-pay | Admitting: Hematology & Oncology

## 2011-12-28 DIAGNOSIS — I639 Cerebral infarction, unspecified: Secondary | ICD-10-CM

## 2011-12-28 DIAGNOSIS — I4891 Unspecified atrial fibrillation: Secondary | ICD-10-CM

## 2011-12-28 NOTE — Telephone Encounter (Signed)
Ginna from [facility] called and sch 01/25/12 apt for pt.  She called back and resch apt for 01/24/12

## 2011-12-28 NOTE — Progress Notes (Signed)
INR = 1.2 drawn yesterday at [facility] Per [name], her mother took a short course of Abx & Prednisone ~6/6 or 6/7.  She has completed both. INR a little low.  I will boost w/ 8 mg today (typically would get 6 mg today) then back to usual dose of 6 mg/day; 4 mg Tu/Th/Sat. Repeat INR 01/24/12 in [facility]. Marily Lente, Pharm.D.

## 2012-01-07 ENCOUNTER — Emergency Department (HOSPITAL_BASED_OUTPATIENT_CLINIC_OR_DEPARTMENT_OTHER)
Admission: EM | Admit: 2012-01-07 | Discharge: 2012-01-07 | Disposition: A | Discharge status: Home / Self Care | Payer: Medicare Other | Attending: Emergency Medicine | Admitting: Emergency Medicine

## 2012-01-07 ENCOUNTER — Emergency Department (HOSPITAL_BASED_OUTPATIENT_CLINIC_OR_DEPARTMENT_OTHER): Payer: Medicare Other

## 2012-01-07 ENCOUNTER — Encounter (HOSPITAL_BASED_OUTPATIENT_CLINIC_OR_DEPARTMENT_OTHER): Payer: Self-pay | Admitting: Emergency Medicine

## 2012-01-07 DIAGNOSIS — J4489 Other specified chronic obstructive pulmonary disease: Secondary | ICD-10-CM | POA: Insufficient documentation

## 2012-01-07 DIAGNOSIS — J3489 Other specified disorders of nose and nasal sinuses: Secondary | ICD-10-CM | POA: Insufficient documentation

## 2012-01-07 DIAGNOSIS — R062 Wheezing: Secondary | ICD-10-CM | POA: Insufficient documentation

## 2012-01-07 DIAGNOSIS — J449 Chronic obstructive pulmonary disease, unspecified: Secondary | ICD-10-CM | POA: Insufficient documentation

## 2012-01-07 DIAGNOSIS — I10 Essential (primary) hypertension: Secondary | ICD-10-CM | POA: Insufficient documentation

## 2012-01-07 DIAGNOSIS — R05 Cough: Secondary | ICD-10-CM | POA: Insufficient documentation

## 2012-01-07 DIAGNOSIS — Z79899 Other long term (current) drug therapy: Secondary | ICD-10-CM | POA: Insufficient documentation

## 2012-01-07 DIAGNOSIS — M7989 Other specified soft tissue disorders: Secondary | ICD-10-CM | POA: Insufficient documentation

## 2012-01-07 DIAGNOSIS — I4891 Unspecified atrial fibrillation: Secondary | ICD-10-CM | POA: Insufficient documentation

## 2012-01-07 DIAGNOSIS — Z8673 Personal history of transient ischemic attack (TIA), and cerebral infarction without residual deficits: Secondary | ICD-10-CM | POA: Insufficient documentation

## 2012-01-07 DIAGNOSIS — K219 Gastro-esophageal reflux disease without esophagitis: Secondary | ICD-10-CM | POA: Insufficient documentation

## 2012-01-07 DIAGNOSIS — R609 Edema, unspecified: Secondary | ICD-10-CM | POA: Insufficient documentation

## 2012-01-07 DIAGNOSIS — F0390 Unspecified dementia without behavioral disturbance: Secondary | ICD-10-CM | POA: Insufficient documentation

## 2012-01-07 DIAGNOSIS — R059 Cough, unspecified: Secondary | ICD-10-CM | POA: Insufficient documentation

## 2012-01-07 DIAGNOSIS — Z7901 Long term (current) use of anticoagulants: Secondary | ICD-10-CM | POA: Insufficient documentation

## 2012-01-07 DIAGNOSIS — R0602 Shortness of breath: Secondary | ICD-10-CM | POA: Insufficient documentation

## 2012-01-07 DIAGNOSIS — M129 Arthropathy, unspecified: Secondary | ICD-10-CM | POA: Insufficient documentation

## 2012-01-07 LAB — CBC WITH DIFFERENTIAL/PLATELET
Eosinophils Absolute: 0.7 10*3/uL (ref 0.0–0.7)
Hemoglobin: 11 g/dL — ABNORMAL LOW (ref 12.0–15.0)
Lymphocytes Relative: 14 % (ref 12–46)
Lymphs Abs: 0.8 10*3/uL (ref 0.7–4.0)
MCH: 27.8 pg (ref 26.0–34.0)
Neutro Abs: 3.8 10*3/uL (ref 1.7–7.7)
Neutrophils Relative %: 65 % (ref 43–77)
Platelets: 315 10*3/uL (ref 150–400)
RBC: 3.95 MIL/uL (ref 3.87–5.11)
WBC: 5.9 10*3/uL (ref 4.0–10.5)

## 2012-01-07 LAB — BASIC METABOLIC PANEL
Chloride: 108 mEq/L (ref 96–112)
GFR calc non Af Amer: 65 mL/min — ABNORMAL LOW (ref >90)
Glucose, Bld: 108 mg/dL — ABNORMAL HIGH (ref 70–99)
Potassium: 3.7 mEq/L (ref 3.5–5.1)
Sodium: 143 mEq/L (ref 135–145)

## 2012-01-07 LAB — PROTIME-INR
INR: 1.15 (ref 0.00–1.49)
Prothrombin Time: 14.9 seconds (ref 11.6–15.2)

## 2012-01-07 LAB — APTT: aPTT: 29 seconds (ref 24–37)

## 2012-01-07 MED ORDER — IPRATROPIUM BROMIDE 0.02 % IN SOLN
0.5000 mg | Freq: Once | RESPIRATORY_TRACT | Status: AC
  Administered 2012-01-07: 0.5 mg via RESPIRATORY_TRACT
  Filled 2012-01-07: qty 2.5

## 2012-01-07 MED ORDER — ALBUTEROL SULFATE (5 MG/ML) 0.5% IN NEBU
5.0000 mg | INHALATION_SOLUTION | Freq: Once | RESPIRATORY_TRACT | Status: AC
  Administered 2012-01-07: 5 mg via RESPIRATORY_TRACT
  Filled 2012-01-07: qty 1

## 2012-01-07 MED ORDER — AZITHROMYCIN 250 MG PO TABS: 250.0000 mg | ORAL_TABLET | Freq: Every day | ORAL | Status: AC

## 2012-01-07 MED ORDER — PREDNISONE 10 MG PO TABS: 20.0000 mg | ORAL_TABLET | Freq: Every day | ORAL | Status: DC

## 2012-01-07 MED ORDER — METHYLPREDNISOLONE SODIUM SUCC 125 MG IJ SOLR
125.0000 mg | Freq: Once | INTRAMUSCULAR | Status: AC
  Administered 2012-01-07: 125 mg via INTRAVENOUS
  Filled 2012-01-07: qty 2

## 2012-01-07 NOTE — ED Notes (Signed)
Pt c/o SHOB, worsened since this am.  Daughter reports multiple episodes of same about "every 4-6 weeks"

## 2012-01-07 NOTE — ED Provider Notes (Signed)
History     CSN: [id number]  Arrival date & time 01/07/12  1191   First MD Initiated Contact with Patient 01/07/12 [phone]      Chief Complaint  Patient presents with  . Shortness of Breath    (Consider location/radiation/quality/duration/timing/severity/associated sxs/prior treatment) HPI Pt with dementia. Details of history provided by daughter. Pt has had several days of increasing congestion and cough with SOB and wheezing worsening over night. No fever chills, sputum production. Audible wheezing.  Past Medical History  Diagnosis Date  . Atrial fibrillation   . Stroke   . Hypertension   . Arthritis   . GERD (gastroesophageal reflux disease)   . COPD (chronic obstructive pulmonary disease)   . PNA (pneumonia)   . Anemia due to GI blood loss 07/11/2011  . 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 Hip fracture surgery   . Cholecystectomy     Family History  Problem Relation Age of Onset  . Heart disease Mother   . Rectal cancer Mother   . Leukemia Father     History  Substance Use Topics  . Smoking status: Never Smoker   . Smokeless tobacco: Never Used  . Alcohol Use: 4.2 oz/week    7 Glasses of wine per week    OB History    Grav Para Term Preterm Abortions TAB SAB Ect Mult Living                  Review of Systems  Constitutional: Negative for fever and chills.  HENT: Positive for congestion. Negative for sore throat.   Respiratory: Positive for cough, shortness of breath and wheezing.   Cardiovascular: Positive for leg swelling. Negative for chest pain and palpitations.  Gastrointestinal: Negative for nausea, vomiting and abdominal pain.  Musculoskeletal: Negative for back pain.  Skin: Negative for rash and wound.  Neurological: Negative for weakness, light-headedness and numbness.    Allergies  Amoxicillin and  Penicillins  Home Medications   Current Outpatient Rx  Name Route Sig Dispense Refill  . VITAMIN D 1000 UNITS PO TABS Oral Take 2,000 Units by mouth daily.    Marland Kitchen MUCINEX PO Oral Take by mouth.    . ALBUTEROL SULFATE (2.5 MG/3ML) 0.083% IN NEBU Nebulization Take 2.5 mg by nebulization 4 (four) times daily.     . ALENDRONATE SODIUM 70 MG PO TABS Oral Take 70 mg by mouth every Tuesday. Take with a full glass of water on an empty stomach.    . AZELASTINE HCL 137 MCG/SPRAY NA SOLN Nasal Place 2 sprays into the nose 2 (two) times daily. Use in each nostril as directed 30 mL 12  . AZITHROMYCIN 250 MG PO TABS Oral Take 1 tablet (250 mg total) by mouth daily. Take first 2 tablets together, then 1 every day until finished. 6 tablet 0  . CALCIUM + D PO Oral Take 1 tablet by mouth daily.    Marland Kitchen VITAMIN D 2000 UNITS PO CAPS Oral Take 1 capsule by mouth daily.      Marland Kitchen DILTIAZEM HCL ER 180 MG PO CP24 Oral Take 180 mg by mouth daily.      Marland Kitchen FERROUS SULFATE 325 (65 FE) MG PO TABS Oral Take 325 mg by mouth daily.     Marland Kitchen FEXOFENADINE HCL 180 MG PO TABS Oral Take 180 mg by mouth daily.    Marland Kitchen FLUTICASONE PROPIONATE  50 MCG/ACT NA SUSP Nasal Place 2 sprays into the nose 2 (two) times daily. 16 g 11  . FUROSEMIDE 40 MG PO TABS Oral Take 20 mg by mouth daily.     Marland Kitchen LORATADINE 10 MG PO TABS Oral Take 1 tablet (10 mg total) by mouth daily. 30 tablet 11  . THERA M PLUS PO TABS Oral Take 1 tablet by mouth daily.      Marland Kitchen PREDNISONE 10 MG PO TABS Oral Take 2 tablets (20 mg total) by mouth daily. 10 tablet 0  . WARFARIN SODIUM 4 MG PO TABS Oral Take 4-6 mg by mouth See admin instructions. She takes one tablet (4mg ) on Tuesday, Thursday Saturday and one and a half tablets (6mg ) on Sunday, Monday, Wednesday, and Friday. (INR MUST BE KEPT AT 2 OR PATIENT WILL HAVE A GI BLEED PER FAMILY)      BP 115/63  Pulse 81  Temp 98.2 F (36.8 C) (Oral)  Resp 18  SpO2 98%  Physical Exam  Nursing note and vitals reviewed. Constitutional:  She is oriented to person, place, and time. She appears well-developed and well-nourished. No distress.  HENT:  Head: Normocephalic and atraumatic.  Mouth/Throat: Oropharynx is clear and moist.  Eyes: EOM are normal. Pupils are equal, round, and reactive to light.  Neck: Normal range of motion. Neck supple.  Cardiovascular: Normal rate and regular rhythm.   Pulmonary/Chest: No respiratory distress. She has wheezes. She has no rales.       increased work of breathing. Prolonged exp phase. Diffuse exp wheezing  Abdominal: Soft. Bowel sounds are normal. There is no tenderness. There is no rebound and no guarding.  Musculoskeletal: Normal range of motion. She exhibits edema (very mild bl edema. No pitting). She exhibits no tenderness.  Neurological: She is alert and oriented to person, place, and time.       Pt able to ambulate down hall with walker  Skin: Skin is warm and dry. No rash noted. No erythema.  Psychiatric: She has a normal mood and affect. Her behavior is normal.    ED Course  Procedures (including critical care time)  Labs Reviewed  CBC WITH DIFFERENTIAL - Abnormal; Notable for the following:    Hemoglobin 11.0 (*)     HCT 35.0 (*)     RDW 17.5 (*)     Eosinophils Relative 11 (*)     All other components within normal limits  BASIC METABOLIC PANEL - Abnormal; Notable for the following:    Glucose, Bld 108 (*)     GFR calc non Af Amer 65 (*)     GFR calc Af Amer 75 (*)     All other components within normal limits  TROPONIN I  PROTIME-INR  APTT   Dg Chest Port 1 View  01/07/2012  *RADIOLOGY REPORT*  Clinical Data: Cough.  Chest congestion.  PORTABLE CHEST - 1 VIEW 01/07/2012 1030 hours:  Comparison: Two-view chest x-ray 10/13/2011, 07/01/2011 [facility] and 07/19/2011, 05/25/2010 [facility].  Findings: Respiratory motion blurs the image.  Cardiac silhouette moderately enlarged but stable.  Coarse reticular interstitial opacities, stable dating back to 2011.   Linear scarring in the central left upper lobe.  Mild biapical pleuroparenchymal scarring. No new pulmonary parenchymal abnormalities.  IMPRESSION: Stable cardiomegaly.  COPD (chronic bronchitis/asthma).  No acute cardiopulmonary disease.  Original Report Authenticated By: Arnell Sieving, M.D.     1. Wheezing      Date: 01/07/2012  Rate: 79  Rhythm: atrial fibrillation  QRS Axis: normal  Intervals: normal  ST/T Wave abnormalities: nonspecific ST changes  Conduction Disutrbances:none  Narrative Interpretation:   Old EKG Reviewed: none available    MDM   Pt is much more comfortable after first treatment. States she thinks she is close to her baseline.    Pt with clear lungs. Sats high 90's on RA. F/u with PMD and pulmonologist. Return for concerns    Loren Racer, MD 01/07/12 1213

## 2012-01-09 ENCOUNTER — Ambulatory Visit (HOSPITAL_BASED_OUTPATIENT_CLINIC_OR_DEPARTMENT_OTHER): Payer: Medicare Other | Admitting: Nurse Practitioner

## 2012-01-09 ENCOUNTER — Telehealth: Payer: Self-pay | Admitting: Hematology & Oncology

## 2012-01-09 ENCOUNTER — Telehealth: Payer: Self-pay | Admitting: Oncology

## 2012-01-09 ENCOUNTER — Ambulatory Visit: Payer: Self-pay | Admitting: Pharmacist

## 2012-01-09 ENCOUNTER — Other Ambulatory Visit (HOSPITAL_BASED_OUTPATIENT_CLINIC_OR_DEPARTMENT_OTHER): Payer: Medicare Other | Admitting: Lab

## 2012-01-09 VITALS — BP 115/65 | HR 68 | Temp 97.1°F | Ht 60.0 in | Wt 153.1 lb

## 2012-01-09 DIAGNOSIS — D500 Iron deficiency anemia secondary to blood loss (chronic): Secondary | ICD-10-CM

## 2012-01-09 DIAGNOSIS — Z7901 Long term (current) use of anticoagulants: Secondary | ICD-10-CM

## 2012-01-09 DIAGNOSIS — I639 Cerebral infarction, unspecified: Secondary | ICD-10-CM

## 2012-01-09 DIAGNOSIS — I4891 Unspecified atrial fibrillation: Secondary | ICD-10-CM

## 2012-01-09 DIAGNOSIS — K922 Gastrointestinal hemorrhage, unspecified: Secondary | ICD-10-CM

## 2012-01-09 DIAGNOSIS — K5520 Angiodysplasia of colon without hemorrhage: Secondary | ICD-10-CM

## 2012-01-09 LAB — CBC & DIFF AND RETIC
Basophils Absolute: 0 10*3/uL (ref 0.0–0.1)
EOS%: 3.2 % (ref 0.0–7.0)
HCT: 34.4 % — ABNORMAL LOW (ref 34.8–46.6)
HGB: 10.8 g/dL — ABNORMAL LOW (ref 11.6–15.9)
Immature Retic Fract: 10.3 % — ABNORMAL HIGH (ref 1.60–10.00)
LYMPH%: 19 % (ref 14.0–49.7)
MCH: 27.6 pg (ref 25.1–34.0)
MCHC: 31.4 g/dL — ABNORMAL LOW (ref 31.5–36.0)
MCV: 87.8 fL (ref 79.5–101.0)
MONO%: 11.5 % (ref 0.0–14.0)
NEUT%: 65.6 % (ref 38.4–76.8)
Platelets: 324 10*3/uL (ref 145–400)
lymph#: 1.1 10*3/uL (ref 0.9–3.3)

## 2012-01-09 LAB — COMPREHENSIVE METABOLIC PANEL
ALT: 14 U/L (ref 0–35)
Albumin: 3.5 g/dL (ref 3.5–5.2)
CO2: 27 mEq/L (ref 19–32)
Calcium: 9.4 mg/dL (ref 8.4–10.5)
Chloride: 108 mEq/L (ref 96–112)
Glucose, Bld: 109 mg/dL — ABNORMAL HIGH (ref 70–99)
Sodium: 142 mEq/L (ref 135–145)
Total Protein: 5.3 g/dL — ABNORMAL LOW (ref 6.0–8.3)

## 2012-01-09 LAB — IRON AND TIBC
Iron: 29 ug/dL — ABNORMAL LOW (ref 42–145)
UIBC: 320 ug/dL (ref 125–400)

## 2012-01-09 LAB — POCT INR: INR: 1.1

## 2012-01-09 LAB — PROTIME-INR: INR: 1.1 — ABNORMAL LOW (ref 2.00–3.50)

## 2012-01-09 NOTE — Progress Notes (Signed)
INR = 1.1 (goal = 1.8-2.3) on 6 mg/day; 4 mg Tu/Th/Sat Pt seen by Lonna Cobb, NP today.  INR low, so I saw pt in Coumadin clinic as add-on. Pt went to ED 2 days ago for wheezing.  RX: Z-pack & Prednisone dose burst. [name], pts dtr mentioned that her mothers medications have changed recently.  She no longer takes Budesonide, Protonix or Zocor.  She is now on Astelin nasal spray & Loratadine in addition to the RX from ED as aforementioned. Both Zocor & Budesonide have potential to increase INR, so now that Ms. Meggison is off these 2 drugs, it is understandable why her INR has been low the last 2 times we've checked it. After discussing this w/ pt & her dtr, I feel that we should have her take 6 mg/day; 4 mg Th/Sat this week only.  This is taking into account her being on Z-pack & Prednisone (both may increase INR). Recheck INR in 1 week.  This will give [place] another "baseline" when she has completed Prednisone & Z-pack.  Likely we will need to consider a dose increase at that time. Per Bonita Quin, her mother was on 8 mg/6 mg a couple of years ago. Pt has appt already scheduled for 01/24/12 in addition to next weeks lab appt (7/16).  I'll go ahead for now & keep the 7/23 appt scheduled.  If we need to change that, we can next week. Marily Lente, Pharm.D.

## 2012-01-09 NOTE — Telephone Encounter (Signed)
ginna from [facility] called and sch 01/17/12 lab apt for pt

## 2012-01-09 NOTE — Telephone Encounter (Signed)
January appt made and printed,daughterr req not to make all lab appts but to do them month by month and stated that she has her next coumadin appt   !!

## 2012-01-09 NOTE — Progress Notes (Signed)
OFFICE PROGRESS NOTE  Interval history:  Angelica [name] is an 78 year old woman with anemia/iron deficiency due to chronic GI blood loss related to AV malformations of the GI tract. She is maintained on chronic Coumadin anticoagulation due to a previous stroke. She is seen today for scheduled followup.  Angelica [name] is accompanied by her daughter. She reports Angelica [name] was hospitalized in April with diarrhea. She was felt to have a viral gastroenteritis. Her daughter reports she received IV iron during hospitalization. Her hemoglobin ranged from 8.6-9.0 . Angelica [name] was seen in the emergency Department on 01/07/2012 with wheezing. She improved following a breathing treatment. She is currently completing a Z-Pak and prednisone. Hemoglobin returned at 11; PT 14.9/INR 1.15.  Angelica [name] continues to note improvement in her breathing. She denies rectal bleeding. Stools are chronically black related to oral iron.   Objective: Blood pressure 115/65, pulse 68, temperature 97.1 F (36.2 C), temperature source Oral, height 5' (1.524 m), weight 153 lb 1.6 oz (69.446 kg).  Oropharynx is without thrush or ulceration. Lungs with bilateral expiratory wheezes. Irregular cardiac rhythm. Abdomen is soft and nontender. No organomegaly. Trace lower leg edema bilaterally. She appears oriented. Follows commands. She is hard of hearing.  Lab Results: Lab Results  Component Value Date   WBC 5.6 01/09/2012   HGB 10.8* 01/09/2012   HCT 34.4* 01/09/2012   MCV 87.8 01/09/2012   PLT 324 01/09/2012    Chemistry:    Chemistry      Component Value Date/Time   NA 143 01/07/2012 1020   K 3.7 01/07/2012 1020   CL 108 01/07/2012 1020   CO2 27 01/07/2012 1020   BUN 13 01/07/2012 1020   CREATININE 0.80 01/07/2012 1020      Component Value Date/Time   CALCIUM 9.4 01/07/2012 1020   ALKPHOS 68 06/29/2011 1340   AST 17 06/29/2011 1340   ALT 23 06/29/2011 1340   BILITOT 0.3 06/29/2011 1340       Studies/Results: Dg Chest Port 1  View  01/07/2012  *RADIOLOGY REPORT*  Clinical Data: Cough.  Chest congestion.  PORTABLE CHEST - 1 VIEW 01/07/2012 1030 hours:  Comparison: Two-view chest x-ray 10/13/2011, 07/01/2011  and 07/19/2011, 05/25/2010 [facility].  Findings: Respiratory motion blurs the image.  Cardiac silhouette moderately enlarged but stable.  Coarse reticular interstitial opacities, stable dating back to 2011.  Linear scarring in the central left upper lobe.  Mild biapical pleuroparenchymal scarring. No new pulmonary parenchymal abnormalities.  IMPRESSION: Stable cardiomegaly.  COPD (chronic bronchitis/asthma).  No acute cardiopulmonary disease.  Original Report Authenticated By: Arnell Sieving, M.D.    Medications: I have reviewed the patient's current medications.  Assessment/Plan:  1. Chronic GI blood loss secondary to AV malformations of the GI tract. Hemoglobin currently at baseline. She continues oral iron. 2. Hospitalization April of this year with diarrhea felt to be viral gastroenteritis. 3. CVA June 2009 with risk factor of chronic atrial fibrillation. Stroke occurred while on aspirin. She is maintained on chronic Coumadin anticoagulation. Coumadin is followed through our office. 4. Chronic atrial fibrillation. 5. Adult-onset asthma.  Disposition-Angelica [name]'s hemoglobin is at baseline. We will followup on the iron studies from today. We will continue to check CBCs and protimes on a monthly basis. She will return for a followup visit in 6 months. She will contact the office in the interim with any problems. She is meeting with the Coumadin pharmacist today.  Lonna Cobb ANP/GNP-BC

## 2012-01-17 ENCOUNTER — Other Ambulatory Visit (HOSPITAL_BASED_OUTPATIENT_CLINIC_OR_DEPARTMENT_OTHER): Payer: Medicare Other | Admitting: Lab

## 2012-01-18 ENCOUNTER — Telehealth: Payer: Self-pay | Admitting: Pharmacist

## 2012-01-18 LAB — POCT INR: INR: 1.3

## 2012-01-19 ENCOUNTER — Ambulatory Visit (HOSPITAL_BASED_OUTPATIENT_CLINIC_OR_DEPARTMENT_OTHER): Payer: Medicare Other | Admitting: Pharmacist

## 2012-01-19 DIAGNOSIS — I4891 Unspecified atrial fibrillation: Secondary | ICD-10-CM

## 2012-01-19 DIAGNOSIS — I6350 Cerebral infarction due to unspecified occlusion or stenosis of unspecified cerebral artery: Secondary | ICD-10-CM

## 2012-01-19 DIAGNOSIS — I639 Cerebral infarction, unspecified: Secondary | ICD-10-CM

## 2012-01-19 NOTE — Progress Notes (Signed)
INR = 1.3 (drawn at [facility] on 01/18/12) Pt taking 6 mg/day; 4 mg Th/Sat. I made several calls over the past 2 days to pts dtr, [name].  Not able to speak w/ Bonita Quin.  I had to leave messages.  Bonita Quin has requested we not call her cell phone any longer so I am honoring her request & only calling her home phone [phone]). I advised Bonita Quin to have her mother change her Coumadin dose to 6 mg/day as the past several INR's have been low (perhaps the cause being stopping budesonide & Zocor). I asked that Bonita Quin call me so we can discuss this further to determine if there are med changes or other concerns/issues. I have scheduled appt for Ms. Gerhart in [facility] for 01/26/12 at 11:15 am & left this appt date/time info on the voicemail message for [place]. Marily Lente, Pharm.D.

## 2012-01-20 ENCOUNTER — Encounter: Payer: Self-pay | Admitting: Pharmacist

## 2012-01-20 NOTE — Progress Notes (Signed)
[name] called me back this AM.  She stated she was at the MD w/ her mother yesterday & didn't get home until after 5 pm. Pt has an allergic vs. Bacterial conjunctivitis.  Eye gtts prescribed. No other med changes. Pt was not able to take 6 mg last night (she took her usual 4 mg dose). Bonita Quin will make sure her mother takes 8 mg x 1 tonight then increase to 6 mg/day. Repeat INR 7/25 as planned. Marily Lente, Pharm.D.

## 2012-01-21 ENCOUNTER — Encounter (HOSPITAL_COMMUNITY): Payer: Self-pay | Admitting: Emergency Medicine

## 2012-01-21 ENCOUNTER — Emergency Department (HOSPITAL_COMMUNITY): Payer: Medicare Other

## 2012-01-21 ENCOUNTER — Emergency Department (HOSPITAL_COMMUNITY)
Admission: EM | Admit: 2012-01-21 | Discharge: 2012-01-21 | Disposition: A | Discharge status: Home / Self Care | Payer: Medicare Other | Attending: Emergency Medicine | Admitting: Emergency Medicine

## 2012-01-21 DIAGNOSIS — Z8673 Personal history of transient ischemic attack (TIA), and cerebral infarction without residual deficits: Secondary | ICD-10-CM | POA: Insufficient documentation

## 2012-01-21 DIAGNOSIS — M129 Arthropathy, unspecified: Secondary | ICD-10-CM | POA: Insufficient documentation

## 2012-01-21 DIAGNOSIS — J45901 Unspecified asthma with (acute) exacerbation: Secondary | ICD-10-CM

## 2012-01-21 DIAGNOSIS — J4489 Other specified chronic obstructive pulmonary disease: Secondary | ICD-10-CM | POA: Insufficient documentation

## 2012-01-21 DIAGNOSIS — J449 Chronic obstructive pulmonary disease, unspecified: Secondary | ICD-10-CM | POA: Insufficient documentation

## 2012-01-21 DIAGNOSIS — D649 Anemia, unspecified: Secondary | ICD-10-CM | POA: Insufficient documentation

## 2012-01-21 DIAGNOSIS — I4891 Unspecified atrial fibrillation: Secondary | ICD-10-CM | POA: Insufficient documentation

## 2012-01-21 DIAGNOSIS — K219 Gastro-esophageal reflux disease without esophagitis: Secondary | ICD-10-CM | POA: Insufficient documentation

## 2012-01-21 DIAGNOSIS — I10 Essential (primary) hypertension: Secondary | ICD-10-CM | POA: Insufficient documentation

## 2012-01-21 LAB — POCT I-STAT TROPONIN I

## 2012-01-21 LAB — PRO B NATRIURETIC PEPTIDE: Pro B Natriuretic peptide (BNP): 483.1 pg/mL — ABNORMAL HIGH (ref 0–450)

## 2012-01-21 LAB — POCT I-STAT, CHEM 8
Creatinine, Ser: 1.1 mg/dL (ref 0.50–1.10)
Hemoglobin: 11.9 g/dL — ABNORMAL LOW (ref 12.0–15.0)
Potassium: 3.5 mEq/L (ref 3.5–5.1)
Sodium: 142 mEq/L (ref 135–145)

## 2012-01-21 LAB — PROTIME-INR: INR: 1.54 — ABNORMAL HIGH (ref 0.00–1.49)

## 2012-01-21 LAB — CBC
Hemoglobin: 10.8 g/dL — ABNORMAL LOW (ref 12.0–15.0)
MCHC: 31.2 g/dL (ref 30.0–36.0)
RBC: 3.93 MIL/uL (ref 3.87–5.11)

## 2012-01-21 MED ORDER — ALBUTEROL SULFATE (5 MG/ML) 0.5% IN NEBU
INHALATION_SOLUTION | RESPIRATORY_TRACT | Status: AC
  Administered 2012-01-21: 03:00:00
  Filled 2012-01-21: qty 2

## 2012-01-21 MED ORDER — ALBUTEROL SULFATE (5 MG/ML) 0.5% IN NEBU
5.0000 mg | INHALATION_SOLUTION | Freq: Once | RESPIRATORY_TRACT | Status: AC
  Administered 2012-01-21: 5 mg via RESPIRATORY_TRACT
  Filled 2012-01-21: qty 1

## 2012-01-21 MED ORDER — IPRATROPIUM BROMIDE 0.02 % IN SOLN
RESPIRATORY_TRACT | Status: AC
  Administered 2012-01-21: 03:00:00
  Filled 2012-01-21: qty 2.5

## 2012-01-21 MED ORDER — METHYLPREDNISOLONE SODIUM SUCC 125 MG IJ SOLR
INTRAMUSCULAR | Status: AC
  Administered 2012-01-21: 125 mg
  Filled 2012-01-21: qty 2

## 2012-01-21 MED ORDER — PREDNISONE 20 MG PO TABS: 60.0000 mg | ORAL_TABLET | Freq: Every day | ORAL | Status: AC

## 2012-01-21 NOTE — ED Notes (Signed)
Rx x 1, pt voiced understanding to f/u with pulmonologist on Monday and return with worsening condition

## 2012-01-21 NOTE — ED Notes (Signed)
Per ems, from home- having SOB- hx of same for past 8 years, with no underlying cause other than allergies and asthma, when EMS arrived- mod distress exp wheezing upper and lower with short inspiratory phase; was already taking albuterol tx at home, received atrovent/albuterol tx by EMS, 125 mg solumedrol IV given; pt c/o edema in legs; denies pain, n/v; 12 lead ekg done- afib 70-80's, 20g LAC, BP 136/76, 100% on Neb- 94% when EMS arrived

## 2012-01-21 NOTE — ED Provider Notes (Signed)
History     CSN: [id number]  Arrival date & time 01/21/12  0300   First MD Initiated Contact with Patient 01/21/12 [phone]      Chief Complaint  Patient presents with  . Shortness of Breath    (Consider location/radiation/quality/duration/timing/severity/associated sxs/prior treatment) HPI History provided by patient and her daughter bedside. Has history of asthma and A. Fib, takes Coumadin and is followed by pulmonologist. She uses albuterol at home when she gets symptoms, uses her inhaler which usually helps. Tonight she became short of breath used her inhaler without relief and called EMS. In route was given nebulizer treatment and steroids by the time she arrives to the emergency department is feeling much better. She has recently established care with her pulmonologist, is scheduled for pulmonary function testing this Monday in 2 days. She denies any chest pain. Shortness of breath has resolved. Has had some mild lower extremity swelling. No chest pain. No dyspnea on exertion. No known aggravating factors. Has history of multiple episodes of similar symptoms in the past. Has been there for the same. Last time she is in emergency department she was discharged home with the same presentation. No leg pain. No recent bleeding. Symptoms moderate in severity. Past Medical History  Diagnosis Date  . Atrial fibrillation   . Hypertension   . Arthritis   . GERD (gastroesophageal reflux disease)   . COPD (chronic obstructive pulmonary disease)   . PNA (pneumonia)   . Anemia due to GI blood loss 07/11/2011  . 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 Hip fracture surgery   . Cholecystectomy   . Knee surgery   . Cataract extraction     Family History  Problem Relation Age of Onset  . Heart disease  Mother   . Rectal cancer Mother   . Leukemia Father     History  Substance Use Topics  . Smoking status: Never Smoker   . Smokeless tobacco: Never Used  . Alcohol Use: 4.2 oz/week    7 Glasses of wine per week    OB History    Grav Para Term Preterm Abortions TAB SAB Ect Mult Living                  Review of Systems  Constitutional: Negative for fever and chills.  HENT: Negative for neck pain and neck stiffness.   Eyes: Negative for pain.  Respiratory: Positive for shortness of breath.   Cardiovascular: Positive for leg swelling. Negative for chest pain and palpitations.  Gastrointestinal: Negative for abdominal pain.  Genitourinary: Negative for dysuria.  Musculoskeletal: Negative for back pain.  Skin: Negative for rash.  Neurological: Negative for headaches.  All other systems reviewed and are negative.    Allergies  Amoxicillin and Penicillins  Home Medications   Current Outpatient Rx  Name Route Sig Dispense Refill  . ALBUTEROL SULFATE (2.5 MG/3ML) 0.083% IN NEBU Nebulization Take 2.5 mg by nebulization 4 (four) times daily.     . ALENDRONATE SODIUM 70 MG PO TABS Oral Take 70 mg by mouth every Tuesday. Take with a full glass of water on an empty stomach.    . AZELASTINE HCL 137 MCG/SPRAY NA SOLN Nasal Place 2 sprays into the nose 2 (two) times daily. Use in each nostril as directed 30 mL 12  .  CALCIUM + D PO Oral Take 1 tablet by mouth daily.    Marland Kitchen VITAMIN D 2000 UNITS PO CAPS Oral Take 1 capsule by mouth daily.      Marland Kitchen DILTIAZEM HCL ER 180 MG PO CP24 Oral Take 180 mg by mouth daily.      Marland Kitchen FERROUS SULFATE 325 (65 FE) MG PO TABS Oral Take 325 mg by mouth daily.     Marland Kitchen FLUTICASONE PROPIONATE 50 MCG/ACT NA SUSP Nasal Place 2 sprays into the nose 2 (two) times daily. 16 g 11  . FUROSEMIDE 40 MG PO TABS Oral Take 40 mg by mouth daily.     Marland Kitchen MUCINEX PO Oral Take 1 tablet by mouth daily.     Marland Kitchen LORATADINE 10 MG PO TABS Oral Take 1 tablet (10 mg total) by mouth daily. 30  tablet 11  . THERA M PLUS PO TABS Oral Take 1 tablet by mouth daily.      Marland Kitchen PREDNISONE 10 MG PO TABS Oral Take 2 tablets (20 mg total) by mouth daily. 10 tablet 0  . WARFARIN SODIUM 4 MG PO TABS Oral Take 6 mg by mouth daily.       BP 124/64  Temp 98.3 F (36.8 C) (Oral)  Resp 25  SpO2 96%  Physical Exam  Constitutional: She is oriented to person, place, and time. She appears well-developed and well-nourished.  HENT:  Head: Normocephalic and atraumatic.  Eyes: Conjunctivae and EOM are normal. Pupils are equal, round, and reactive to light.  Neck: Trachea normal. Neck supple. No thyromegaly present.  Cardiovascular: Normal rate, regular rhythm, S1 normal, S2 normal and normal pulses.     No systolic murmur is present   No diastolic murmur is present  Pulses:      Radial pulses are 2+ on the right side, and 2+ on the left side.  Pulmonary/Chest: Effort normal. She has no rhonchi. She has no rales. She exhibits no tenderness.       Very mild bilateral expiratory wheezes with good air movement otherwise.  Abdominal: Soft. Normal appearance and bowel sounds are normal. There is no tenderness. There is no CVA tenderness and negative Murphy's sign.  Musculoskeletal:       BLE:s 1+ symmetric pretibial edema, Calves nontender, no cords or erythema, negative Homans sign  Neurological: She is alert and oriented to person, place, and time. She has normal strength. No cranial nerve deficit or sensory deficit. GCS eye subscore is 4. GCS verbal subscore is 5. GCS motor subscore is 6.  Skin: Skin is warm and dry. No rash noted. She is not diaphoretic.  Psychiatric: Her speech is normal.       Cooperative and appropriate    ED Course  Procedures (including critical care time)  Results for orders placed during the hospital encounter of 01/21/12  PROTIME-INR      Component Value Range   Prothrombin Time 18.8 (*) 11.6 - 15.2 seconds   INR 1.54 (*) 0.00 - 1.49  PRO B NATRIURETIC PEPTIDE       Component Value Range   Pro B Natriuretic peptide (BNP) 483.1 (*) 0 - 450 pg/mL  CBC      Component Value Range   WBC 13.4 (*) 4.0 - 10.5 K/uL   RBC 3.93  3.87 - 5.11 MIL/uL   Hemoglobin 10.8 (*) 12.0 - 15.0 g/dL   HCT 86.5 (*) 78.4 - 69.6 %   MCV 88.0  78.0 - 100.0 fL   MCH 27.5  26.0 - 34.0 pg   MCHC 31.2  30.0 - 36.0 g/dL   RDW 16.1 (*) 09.6 - 04.5 %   Platelets 299  150 - 400 K/uL  POCT I-STAT, CHEM 8      Component Value Range   Sodium 142  135 - 145 mEq/L   Potassium 3.5  3.5 - 5.1 mEq/L   Chloride 103  96 - 112 mEq/L   BUN 12  6 - 23 mg/dL   Creatinine, Ser 4.09  0.50 - 1.10 mg/dL   Glucose, Bld 811 (*) 70 - 99 mg/dL   Calcium, Ion 9.14  7.82 - 1.30 mmol/L   TCO2 27  0 - 100 mmol/L   Hemoglobin 11.9 (*) 12.0 - 15.0 g/dL   HCT 95.6 (*) 21.3 - 08.6 %  POCT I-STAT TROPONIN I      Component Value Range   Troponin i, poc 0.01  0.00 - 0.08 ng/mL   Comment 3            Dg Chest 2 View  01/21/2012  *RADIOLOGY REPORT*  Clinical Data: Shortness of breath and cough.  CHEST - 2 VIEW  Comparison: 01/07/2012  Findings: Shallow inspiration.  Mild cardiac enlargement without pulmonary vascular congestion or edema.  Emphysematous changes and fibrosis in the lungs.  Calcification of the aorta.  Hiatal hernia behind the heart.  No focal consolidation in the lungs.  No blunting of costophrenic angles.  No pneumothorax.  No significant change since previous study.  IMPRESSION: Cardiac enlargement.  Emphysematous changes and scattered fibrosis in the lungs.  No active disease.  Hiatal hernia.  Original Report Authenticated By: Marlon Pel, M.D.   Dg Chest Port 1 View  01/07/2012  *RADIOLOGY REPORT*  Clinical Data: Cough.  Chest congestion.  PORTABLE CHEST - 1 VIEW 01/07/2012 1030 hours:  Comparison: Two-view chest x-ray 10/13/2011, 07/01/2011 [facility] and 07/19/2011, 05/25/2010 [facility].  Findings: Respiratory motion blurs the image.  Cardiac silhouette moderately enlarged but  stable.  Coarse reticular interstitial opacities, stable dating back to 2011.  Linear scarring in the central left upper lobe.  Mild biapical pleuroparenchymal scarring. No new pulmonary parenchymal abnormalities.  IMPRESSION: Stable cardiomegaly.  COPD (chronic bronchitis/asthma).  No acute cardiopulmonary disease.  Original Report Authenticated By: Arnell Sieving, M.D.     Date: 01/21/2012  Rate: 64  Rhythm: atrial fibrillation  QRS Axis: normal  Intervals: normal  ST/T Wave abnormalities: nonspecific ST changes  Conduction Disutrbances:none  Narrative Interpretation:   Old EKG Reviewed: unchanged  Albuterol treatment provided. On recheck patient is requesting to be discharged home. EKG, x-ray and labs reviewed as above. Condition improved prior to arrival with albuterol and steroids. Plan keep pulmonary followup in 2 days as scheduled.   6:16 AM Ambulates with from her pulse ox high 90s. -Getting dressed and patient requesting to be discharged. MDM   shortness of breath with reactive airway disease improved with albuterol prior to arrival. Prescription for prednisone provided. Has albuterol at home. Nursing notes reviewed. Old records reviewed. Vital signs reviewed. Labs, EKG and imaging reviewed as above.        Sunnie Nielsen, MD 01/21/12 [phone]

## 2012-01-23 ENCOUNTER — Ambulatory Visit: Payer: Medicare Other | Admitting: Emergency Medicine

## 2012-01-24 ENCOUNTER — Other Ambulatory Visit: Payer: Medicare Other | Admitting: Lab

## 2012-01-25 ENCOUNTER — Other Ambulatory Visit: Payer: Medicare Other | Admitting: Lab

## 2012-01-25 ENCOUNTER — Telehealth: Payer: Self-pay | Admitting: Emergency Medicine

## 2012-01-25 NOTE — Telephone Encounter (Signed)
Called and spoke with the pt's daughter. She states that she does not think that the pt needs to have PFT's at this point, but she does want to have sooner f/u with RB since pt has been to ED with respiratory symptoms x 2 since last ov here. I gave her appt with TP for 02-02-12 (30 min slot ok per JJ). Daughter states nothing further needed.

## 2012-01-25 NOTE — Telephone Encounter (Signed)
Pt's daughter requests to get an earlier PFT & ROV w/ RB than what is currently scheduled.  Antionette Fairy

## 2012-01-26 ENCOUNTER — Telehealth: Payer: Self-pay | Admitting: Pharmacist

## 2012-01-26 ENCOUNTER — Other Ambulatory Visit: Payer: Medicare Other | Admitting: Lab

## 2012-01-26 DIAGNOSIS — I4891 Unspecified atrial fibrillation: Secondary | ICD-10-CM

## 2012-01-26 LAB — POCT INR: INR: 2.1

## 2012-01-26 LAB — PROTIME-INR (CHCC SATELLITE): Protime: 25.2 Seconds — ABNORMAL HIGH (ref 10.6–13.4)

## 2012-01-27 ENCOUNTER — Ambulatory Visit (HOSPITAL_BASED_OUTPATIENT_CLINIC_OR_DEPARTMENT_OTHER): Payer: Medicare Other | Admitting: Pharmacist

## 2012-01-27 DIAGNOSIS — I4891 Unspecified atrial fibrillation: Secondary | ICD-10-CM

## 2012-01-27 DIAGNOSIS — I639 Cerebral infarction, unspecified: Secondary | ICD-10-CM

## 2012-01-27 NOTE — Progress Notes (Signed)
INR at goal.  Will continue Coumadin 6mg  daily and check PT/INR in [place] on 02/09/12.

## 2012-01-31 ENCOUNTER — Telehealth: Payer: Self-pay | Admitting: Pharmacist

## 2012-01-31 NOTE — Telephone Encounter (Signed)
Daughter called to change appmt from 8/8 to 8/6.

## 2012-02-02 ENCOUNTER — Encounter: Payer: Self-pay | Admitting: Adult Health

## 2012-02-02 ENCOUNTER — Ambulatory Visit (INDEPENDENT_AMBULATORY_CARE_PROVIDER_SITE_OTHER): Payer: Medicare Other | Admitting: Adult Health

## 2012-02-02 VITALS — BP 98/58 | HR 68 | Temp 100.0°F | Ht 64.0 in | Wt 150.2 lb

## 2012-02-02 DIAGNOSIS — J449 Chronic obstructive pulmonary disease, unspecified: Secondary | ICD-10-CM

## 2012-02-02 NOTE — Assessment & Plan Note (Addendum)
Recurrent exacerbation with ER visits and steroid rx  ? COPD dx as she is a never smoker, may have obstruction AB  Will need PFT to better evaluate.  Will hold Fosamax for now as this may be contributing to UA irritability.  Add back budesonide neb in hope to avoid oral steroids Cont on sinus regimen   ?question component of volume overload w/ underlying atrial fib  prev echo showing mod left/ right atrium dilatition  And elevated BNP in past   Plan;  saline nasal rinses As needed   Hold Fosamax .  Restart Budesonide Neb Twice daily   Follow up Dr. Delton Coombes  In 3-4 weeks  Set up PFT before next visit with Dr. Delton Coombes   Please contact office for sooner follow up if symptoms do not improve or worsen or seek emergency care

## 2012-02-02 NOTE — Progress Notes (Signed)
Subjective:    Patient ID: Angelica [name], female    DOB: 1923-01-13, 78 y.o.   MRN: [id number] HPI 78 yo woman, never smoker, hx of HTN, A Fib on coumadin, CAD with hx CHF (Dr Rennis Golden), CVA, dementia, allergies formerly on shots now seen at [facility] and sensitive to ragweed. Admitted x 2 12/12 for L PNA +/- pulm edema. Also with ? Dx asthma +/- COPD diagnosed recently clinically. She has had PFT remotely in [place]. Was started budesonide + albuterol for 4+ yrs, recently started on Spiriva without much clinical response. No aspiration sx. No GERD symptoms on a PPI.   ROV 11/17/11 -- pleasant 78 yo woman, hx HTN, A Fib, CAD/CHF. Has syndrome consistent w sinus drainage and UA/lower airways disease. Given dx COPD/asthma although unclear. Last time we started loratadine, fluticasone, unable to do [place].  Remains on pulmicort nebs + albuterol nebs. We stopped spiriva. Had planned for PFT today but unable to do due to some recent sinonasal gtt and disease.   02/02/2012 ER follow up  Seen in ER on 7/20 for dyspnea, tx for COPD flare w/ steroid rx  CXR showed no acute process,  Labs neg card enzymes and BNP at 483  She is feeling better but still has on/off dyspnea and wheezing  Uses albuterol Three times a day  most days  Off budesonide neb since May.  Family wants her put on low dose steroids because she has been to ER > 3 times this year for COPD flare and gets better on steroids .  No hemotpysis or increased edema.     Review of Systems  Constitutional:   No  weight loss, night sweats,  Fevers, chills,  +fatigue, or  lassitude.  HEENT:   No headaches,  Difficulty swallowing,  Tooth/dental problems, or  Sore throat,                No sneezing, itching, ear ache,  +nasal congestion, post nasal drip,   CV:  No chest pain,  Orthopnea, PND, swelling in lower extremities, anasarca, dizziness, palpitations, syncope.   GI  No heartburn, indigestion, abdominal pain, nausea, vomiting, diarrhea, change in  bowel habits, loss of appetite, bloody stools.   Resp:    No coughing up of blood.  No change in color of mucus.  No wheezing.  No chest wall deformity  Skin: no rash or lesions.  GU: no dysuria, change in color of urine, no urgency or frequency.  No flank pain, no hematuria   MS:  No joint pain or swelling.  No decreased range of motion.     Psych:  No change in mood or affect. No depression or anxiety.  No memory loss.        TTE 06/15/11:  Study Conclusions  - Left ventricle: The cavity size was normal. Wall thickness was increased in a pattern of mild LVH. Systolic function was normal. The estimated ejection fraction was in the range of 55% to 60%. Wall motion was normal; there were no regional wall motion abnormalities. - Mitral valve: Calcified annulus. - Left atrium: The atrium was moderately dilated. - Right atrium: The atrium was moderately dilated. - Tricuspid valve: Moderate regurgitation. - Pulmonary arteries: Systolic pressure was mildly increased. PA peak pressure: 39mm Hg (S). Impressions:  - Oscillating density in RV on parasternal short axis views most likely papillary muscle       Objective:   Physical Exam  Gen: Pleasant, well-nourished, in no distress,  Poor hx  giver - her daughter answers the questions  ENT: No lesions,  mouth clear,  oropharynx clear, no postnasal drip  Neck: No JVD, no TMG, no carotid bruits  Lungs: No use of accessory muscles, no dullness to percussion, coarse BS  without rales or rhonchi or wheezing   Cardiovascular: RRR, heart sounds normal, no murmur or gallops, trace LE edema  Musculoskeletal: No deformities, no cyanosis or clubbing  Neuro: alert, non focal  Skin: Warm, no lesions or rashes       Assessment & Plan:  No problem-specific assessment & plan notes found for this encounter.

## 2012-02-02 NOTE — Patient Instructions (Addendum)
Saline nasal rinses As needed   Hold Fosamax .  Restart Budesonide Neb Twice daily   Follow up Dr. Delton Coombes  In 3-4 weeks  Set up PFT before next visit with Dr. Delton Coombes   Please contact office for sooner follow up if symptoms do not improve or worsen or seek emergency care

## 2012-02-07 ENCOUNTER — Other Ambulatory Visit (HOSPITAL_BASED_OUTPATIENT_CLINIC_OR_DEPARTMENT_OTHER): Payer: Medicare Other | Admitting: Lab

## 2012-02-07 ENCOUNTER — Telehealth: Payer: Self-pay | Admitting: Hematology & Oncology

## 2012-02-07 ENCOUNTER — Ambulatory Visit: Payer: Self-pay | Admitting: Pharmacist

## 2012-02-07 DIAGNOSIS — I4891 Unspecified atrial fibrillation: Secondary | ICD-10-CM

## 2012-02-07 DIAGNOSIS — I639 Cerebral infarction, unspecified: Secondary | ICD-10-CM

## 2012-02-07 LAB — CBC WITH DIFFERENTIAL (CANCER CENTER ONLY)
BASO#: 0.1 10*3/uL (ref 0.0–0.2)
Eosinophils Absolute: 0.3 10*3/uL (ref 0.0–0.5)
HCT: 35.6 % (ref 34.8–46.6)
HGB: 10.9 g/dL — ABNORMAL LOW (ref 11.6–15.9)
LYMPH#: 0.7 10*3/uL — ABNORMAL LOW (ref 0.9–3.3)
MCHC: 30.6 g/dL — ABNORMAL LOW (ref 32.0–36.0)
MONO#: 0.6 10*3/uL (ref 0.1–0.9)
NEUT%: 74.4 % (ref 39.6–80.0)
RBC: 3.88 10*6/uL (ref 3.70–5.32)
WBC: 6.7 10*3/uL (ref 3.9–10.0)

## 2012-02-07 LAB — PROTIME-INR (CHCC SATELLITE)

## 2012-02-07 NOTE — Telephone Encounter (Signed)
Melissa from [facility] called and sch appt for patient on 03/13/12

## 2012-02-07 NOTE — Progress Notes (Signed)
INR at goal 1.8-2.3.  Per [name], daughter, pt has stated pulmicort and is taking mucinex for congestion.  Will check PT/INR in 74month at [facility] on 03/13/12 at 1030.

## 2012-02-09 ENCOUNTER — Other Ambulatory Visit: Payer: Medicare Other | Admitting: Lab

## 2012-02-20 ENCOUNTER — Ambulatory Visit (INDEPENDENT_AMBULATORY_CARE_PROVIDER_SITE_OTHER): Payer: Medicare Other | Admitting: Emergency Medicine

## 2012-02-20 ENCOUNTER — Other Ambulatory Visit: Payer: Self-pay | Admitting: Oncology

## 2012-02-20 ENCOUNTER — Encounter: Payer: Self-pay | Admitting: Emergency Medicine

## 2012-02-20 DIAGNOSIS — I4891 Unspecified atrial fibrillation: Secondary | ICD-10-CM

## 2012-02-20 DIAGNOSIS — J449 Chronic obstructive pulmonary disease, unspecified: Secondary | ICD-10-CM

## 2012-02-20 DIAGNOSIS — I639 Cerebral infarction, unspecified: Secondary | ICD-10-CM

## 2012-02-20 LAB — PULMONARY FUNCTION TEST

## 2012-02-20 NOTE — Progress Notes (Signed)
PFT done today. 

## 2012-03-01 ENCOUNTER — Ambulatory Visit: Payer: Medicare Other | Admitting: Emergency Medicine

## 2012-03-02 ENCOUNTER — Encounter: Payer: Self-pay | Admitting: Emergency Medicine

## 2012-03-05 ENCOUNTER — Emergency Department (HOSPITAL_BASED_OUTPATIENT_CLINIC_OR_DEPARTMENT_OTHER): Payer: Medicare Other

## 2012-03-05 ENCOUNTER — Encounter (HOSPITAL_BASED_OUTPATIENT_CLINIC_OR_DEPARTMENT_OTHER): Payer: Self-pay | Admitting: *Deleted

## 2012-03-05 ENCOUNTER — Emergency Department (HOSPITAL_BASED_OUTPATIENT_CLINIC_OR_DEPARTMENT_OTHER)
Admission: EM | Admit: 2012-03-05 | Discharge: 2012-03-05 | Disposition: A | Discharge status: Home / Self Care | Payer: Medicare Other | Attending: Emergency Medicine | Admitting: Emergency Medicine

## 2012-03-05 DIAGNOSIS — J45909 Unspecified asthma, uncomplicated: Secondary | ICD-10-CM | POA: Insufficient documentation

## 2012-03-05 DIAGNOSIS — I10 Essential (primary) hypertension: Secondary | ICD-10-CM | POA: Insufficient documentation

## 2012-03-05 DIAGNOSIS — Z7901 Long term (current) use of anticoagulants: Secondary | ICD-10-CM | POA: Insufficient documentation

## 2012-03-05 DIAGNOSIS — J45901 Unspecified asthma with (acute) exacerbation: Secondary | ICD-10-CM

## 2012-03-05 DIAGNOSIS — I6990 Unspecified sequelae of unspecified cerebrovascular disease: Secondary | ICD-10-CM | POA: Insufficient documentation

## 2012-03-05 DIAGNOSIS — I4891 Unspecified atrial fibrillation: Secondary | ICD-10-CM | POA: Insufficient documentation

## 2012-03-05 DIAGNOSIS — J189 Pneumonia, unspecified organism: Secondary | ICD-10-CM | POA: Insufficient documentation

## 2012-03-05 DIAGNOSIS — R0602 Shortness of breath: Secondary | ICD-10-CM | POA: Insufficient documentation

## 2012-03-05 HISTORY — DX: Hypoxemia: R09.02

## 2012-03-05 LAB — CBC WITH DIFFERENTIAL/PLATELET
Basophils Absolute: 0.1 10*3/uL (ref 0.0–0.1)
Basophils Relative: 1 % (ref 0–1)
Eosinophils Relative: 11 % — ABNORMAL HIGH (ref 0–5)
Lymphocytes Relative: 12 % (ref 12–46)
MCHC: 31.9 g/dL (ref 30.0–36.0)
Neutro Abs: 4.6 10*3/uL (ref 1.7–7.7)
Platelets: 332 10*3/uL (ref 150–400)
RDW: 15.1 % (ref 11.5–15.5)
WBC: 6.9 10*3/uL (ref 4.0–10.5)

## 2012-03-05 LAB — COMPREHENSIVE METABOLIC PANEL
Alkaline Phosphatase: 92 U/L (ref 39–117)
BUN: 12 mg/dL (ref 6–23)
CO2: 29 mEq/L (ref 19–32)
Chloride: 108 mEq/L (ref 96–112)
Creatinine, Ser: 0.8 mg/dL (ref 0.50–1.10)
GFR calc non Af Amer: 64 mL/min — ABNORMAL LOW (ref >90)
Glucose, Bld: 100 mg/dL — ABNORMAL HIGH (ref 70–99)
Potassium: 3.7 mEq/L (ref 3.5–5.1)
Total Bilirubin: 0.2 mg/dL — ABNORMAL LOW (ref 0.3–1.2)

## 2012-03-05 LAB — TROPONIN I: Troponin I: <0.3 ng/mL (ref <0.30)

## 2012-03-05 LAB — PROTIME-INR
INR: 1.24 (ref 0.00–1.49)
Prothrombin Time: 15.9 seconds — ABNORMAL HIGH (ref 11.6–15.2)

## 2012-03-05 MED ORDER — ALBUTEROL SULFATE (5 MG/ML) 0.5% IN NEBU
5.0000 mg | INHALATION_SOLUTION | Freq: Once | RESPIRATORY_TRACT | Status: AC
  Administered 2012-03-05: 5 mg via RESPIRATORY_TRACT
  Filled 2012-03-05: qty 1

## 2012-03-05 MED ORDER — CEPHALEXIN 500 MG PO CAPS: ORAL_CAPSULE | ORAL | Status: AC

## 2012-03-05 MED ORDER — CEFTRIAXONE SODIUM 1 G IJ SOLR
1.0000 g | Freq: Once | INTRAMUSCULAR | Status: AC
  Administered 2012-03-05: 1 g via INTRAVENOUS
  Filled 2012-03-05: qty 10

## 2012-03-05 MED ORDER — ALBUTEROL SULFATE (5 MG/ML) 0.5% IN NEBU
INHALATION_SOLUTION | RESPIRATORY_TRACT | Status: AC
  Filled 2012-03-05: qty 1

## 2012-03-05 MED ORDER — IPRATROPIUM BROMIDE 0.02 % IN SOLN
0.5000 mg | Freq: Once | RESPIRATORY_TRACT | Status: AC
  Administered 2012-03-05: 0.5 mg via RESPIRATORY_TRACT
  Filled 2012-03-05: qty 2.5

## 2012-03-05 MED ORDER — AZITHROMYCIN 250 MG PO TABS: ORAL_TABLET | ORAL | Status: AC

## 2012-03-05 MED ORDER — PREDNISONE 20 MG PO TABS: ORAL_TABLET | ORAL | Status: AC

## 2012-03-05 MED ORDER — PREDNISONE 50 MG PO TABS
60.0000 mg | ORAL_TABLET | Freq: Once | ORAL | Status: AC
  Administered 2012-03-05: 60 mg via ORAL
  Filled 2012-03-05: qty 1

## 2012-03-05 MED ORDER — AZITHROMYCIN 250 MG PO TABS
500.0000 mg | ORAL_TABLET | Freq: Once | ORAL | Status: AC
  Administered 2012-03-05: 500 mg via ORAL
  Filled 2012-03-05: qty 2

## 2012-03-05 MED ORDER — ALBUTEROL SULFATE (5 MG/ML) 0.5% IN NEBU
5.0000 mg | INHALATION_SOLUTION | Freq: Once | RESPIRATORY_TRACT | Status: AC
  Administered 2012-03-05: 5 mg via RESPIRATORY_TRACT

## 2012-03-05 NOTE — ED Notes (Signed)
Explained to Pt. And Pt. Daughter that she cant have apple sauce at this time due to lab values are up in some areas.  Pt. Daughter at bedside asking multiple questions.  RN and Resp. Therapist attempting to answer questions for the Pt.

## 2012-03-05 NOTE — ED Provider Notes (Signed)
History     CSN: [id number]  Arrival date & time 03/05/12  1222   First MD Initiated Contact with Patient 03/05/12 1253      No chief complaint on file.  shortness of breath  (Consider location/radiation/quality/duration/timing/severity/associated sxs/prior treatment) HPI This 78 year old female has a history of chronic atrial fibrillation taking Coumadin as well as a history of asthma with recurrent exacerbations. She is not on oxygen at home. She has baseline dementia. She was fine yesterday but this morning has shortness breath like prior exacerbations of asthma. She has slight cough. There is no fever and no change in her mental status. She still awake and alert. She is no chest pain abdominal pain vomiting diarrhea rashes. She is no lateralizing focal weakness. She and the patient's daughter thinks patient will get some breathing treatments then go back home. Past Medical History  Diagnosis Date  . Atrial fibrillation   . Hypertension   . Arthritis   . GERD (gastroesophageal reflux disease)   . COPD (chronic obstructive pulmonary disease)   . PNA (pneumonia)   . Anemia due to GI blood loss 07/11/2011  . 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 Stroke 2009    aphasia and memory impairment as residual  . Hypoxia     Past Surgical History  Procedure Date  . Hip fracture surgery   . Cholecystectomy   . Knee surgery   . Cataract extraction     Family History  Problem Relation Age of Onset  . Heart disease Mother   . Rectal cancer Mother   . Leukemia Father     History  Substance Use Topics  . Smoking status: Never Smoker   . Smokeless tobacco: Never Used  . Alcohol Use: 4.2 oz/week    7 Glasses of wine per week    OB History    Grav Para Term Preterm Abortions TAB SAB Ect Mult Living                  Review of Systems 10 Systems reviewed and are negative for acute  change except as noted in the HPI. Allergies  Amoxicillin and Penicillins  Home Medications   Current Outpatient Rx  Name Route Sig Dispense Refill  . ALBUTEROL SULFATE (2.5 MG/3ML) 0.083% IN NEBU Nebulization Take 2.5 mg by nebulization 4 (four) times daily.     . DORNASE ALFA 1 MG/ML IN SOLN Inhalation Inhale 0.5 mg into the lungs 2 (two) times daily.    Marland Kitchen PANTOPRAZOLE SODIUM 40 MG PO TBEC Oral Take 40 mg by mouth daily.    Marland Kitchen PENTOXIFYLLINE ER 400 MG PO TBCR Oral Take 400 mg by mouth 3 (three) times daily with meals.    . WARFARIN SODIUM 6 MG PO TABS Oral Take 6 mg by mouth daily.    . AZELASTINE HCL 137 MCG/SPRAY NA SOLN Nasal Place 2 sprays into the nose 2 (two) times daily. Use in each nostril as directed 30 mL 12  . AZITHROMYCIN 250 MG PO TABS  1 tab daily x next 4 days 4 tablet 0  . CALCIUM + D PO Oral Take 1 tablet by mouth daily.    . CEPHALEXIN 500 MG PO CAPS  2 caps po bid x 7 days 28 capsule 0  . VITAMIN D 2000 UNITS PO CAPS Oral Take 1 capsule by mouth daily.     Marland Kitchen DILTIAZEM HCL ER 180 MG PO CP24  Oral Take 180 mg by mouth daily.      Marland Kitchen FERROUS SULFATE 325 (65 FE) MG PO TABS Oral Take 650 mg by mouth daily. 2 tablets per day Total-   1300mg     . FLUTICASONE PROPIONATE 50 MCG/ACT NA SUSP Nasal Place 2 sprays into the nose 2 (two) times daily. 16 g 11  . FUROSEMIDE 40 MG PO TABS Oral Take 40 mg by mouth daily.     Marland Kitchen MUCINEX PO Oral Take 1 tablet by mouth daily. 1800mg  daily    . LORATADINE 10 MG PO TABS Oral Take 1 tablet (10 mg total) by mouth daily. 30 tablet 11  . THERA M PLUS PO TABS Oral Take 1 tablet by mouth daily.      Marland Kitchen PREDNISONE 20 MG PO TABS  2 tabs po daily x 4 days 8 tablet 0  . WARFARIN SODIUM 4 MG PO TABS  TAKE AS DIRECTED BY MD EVERY EVENING 160 tablet 4    BP 134/93  Pulse 81  Temp 99.3 F (37.4 C) (Oral)  Resp 18  SpO2 92%  Physical Exam  Nursing note and vitals reviewed. Constitutional:       Awake, alert, nontoxic appearance.  HENT:  Head:  Atraumatic.  Eyes: Right eye exhibits no discharge. Left eye exhibits no discharge.  Neck: Neck supple.  Cardiovascular: Normal rate.   No murmur heard.      Irregularly irregular rhythm  Pulmonary/Chest: She is in respiratory distress. She has wheezes. She has no rales. She exhibits no tenderness.       Patient is resting comfortably with minimal to mild respiratory distress with diffuse mild expiratory wheezes with no crackles no accessory muscle usage no retractions  Abdominal: Soft. There is no tenderness. There is no rebound.  Musculoskeletal: She exhibits no edema and no tenderness.       Baseline ROM, no obvious new focal weakness.  Neurological: She is alert.       Mental status and motor strength appears baseline for patient and situation.  Skin: No rash noted.  Psychiatric: She has a normal mood and affect.    ED Course  Procedures (including critical care time)  ECG: Atrial fibrillation, ventricular rate 73, normal axis, nonspecific ST changes, no significant change noted compared with July 2013   Pt feels baseline able to stand up by herself at bedside RA sat 90-92% wants to try discharge and daughter OK with that, has home nebs.  Will order Rocephin and Zmax in ED. Clinically doubt ACS or significant HF exacerbation despite proBNP higher than previous.1530 Labs Reviewed  CBC WITH DIFFERENTIAL - Abnormal; Notable for the following:    Hemoglobin 11.5 (*)     Eosinophils Relative 11 (*)     All other components within normal limits  PRO B NATRIURETIC PEPTIDE - Abnormal; Notable for the following:    Pro B Natriuretic peptide (BNP) 1140.0 (*)     All other components within normal limits  COMPREHENSIVE METABOLIC PANEL - Abnormal; Notable for the following:    Glucose, Bld 100 (*)     Total Protein 5.9 (*)     Albumin 3.1 (*)     Total Bilirubin 0.2 (*)     GFR calc non Af Amer 64 (*)     GFR calc Af Amer 75 (*)     All other components within normal limits  PROTIME-INR  - Abnormal; Notable for the following:    Prothrombin Time 15.9 (*)  All other components within normal limits  TROPONIN I   No results found.   1. CAP (community acquired pneumonia)   2. Asthma attack       MDM          Hurman Horn, MD 03/09/12 2217

## 2012-03-05 NOTE — ED Notes (Signed)
Daughter of patient states she went by to check on her mother this morning and found her incommunicable in the bed with an oxygen level of 91 per pulse ox.  States she checked her a few hours later and found her oxygen level decreased to 90.  States she has a history of a fib and multiple episodes of decreased oxygen level from unknown etiology.

## 2012-03-05 NOTE — ED Notes (Signed)
Stood Pt. With  EDP at bedside for o2 sat.  Pt. In no distress.

## 2012-03-09 ENCOUNTER — Encounter: Payer: Self-pay | Admitting: Emergency Medicine

## 2012-03-09 ENCOUNTER — Ambulatory Visit (INDEPENDENT_AMBULATORY_CARE_PROVIDER_SITE_OTHER): Payer: Medicare Other | Admitting: Emergency Medicine

## 2012-03-09 VITALS — BP 102/80 | HR 65 | Temp 98.3°F | Ht 64.0 in | Wt 157.0 lb

## 2012-03-09 DIAGNOSIS — J309 Allergic rhinitis, unspecified: Secondary | ICD-10-CM

## 2012-03-09 DIAGNOSIS — J302 Other seasonal allergic rhinitis: Secondary | ICD-10-CM

## 2012-03-09 DIAGNOSIS — J449 Chronic obstructive pulmonary disease, unspecified: Secondary | ICD-10-CM

## 2012-03-09 NOTE — Progress Notes (Signed)
Subjective:    Patient ID: Angelica [name], female    DOB: [birth year], 78 y.o.   MRN: [id number] HPI 78 yo woman, never smoker, hx of HTN, A Fib on coumadin, CAD with hx CHF (Dr Rennis Golden), CVA, dementia, allergies formerly on shots now seen at [facility] and sensitive to ragweed. Admitted x 2 12/12 for L PNA +/- pulm edema. Also with ? Dx asthma +/- COPD diagnosed recently clinically. She has had PFT remotely in [place]. Was started budesonide + albuterol for 4+ yrs, recently started on Spiriva without much clinical response. No aspiration sx. No GERD symptoms on a PPI.   ROV 11/17/11 -- pleasant 78 yo woman, hx HTN, A Fib, CAD/CHF. Has syndrome consistent w sinus drainage and UA/lower airways disease. Given dx COPD/asthma although unclear. Last time we started loratadine, fluticasone, unable to do [place].  Remains on pulmicort nebs + albuterol nebs. We stopped spiriva. Had planned for PFT today but unable to do due to some recent sinonasal gtt and disease.   ER follow up 02/02/12 -  Seen in ER on 7/20 for dyspnea, tx for COPD flare w/ steroid rx  CXR showed no acute process,  Labs neg card enzymes and BNP at 483  She is feeling better but still has on/off dyspnea and wheezing  Uses albuterol Three times a day  most days  Off budesonide neb since May.  Family wants her put on low dose steroids because she has been to ER > 3 times this year for COPD flare and gets better on steroids .  No hemotpysis or increased edema.   ROV 03/09/12 -- hx HTN, A Fib, CAD/CHF. Has syndrome consistent w sinus drainage and UA/lower airways disease. Given dx COPD/asthma although unclear. PFT with mild mixed disease 02/20/12. She reports that since her visit above with TP, she has been seen at Urgent Care for ? Flare. Taken there for lethargy, and relative hypoxemia (SpO2 90-91%).  Patient is unable to tell me her sx. She has significant nasal gtt, largely UA sx but with crossover to   PULMONARY FUNCTON TEST 02/20/2012  FVC 1.32    FEV1 .92  FEV1/FVC 69.7  FVC  % Predicted 55  FEV % Predicted 58  FeF 25-75 .57  FeF 25-75 % Predicted 1.78    TTE 06/15/11:  Study Conclusions - Left ventricle: The cavity size was normal. Wall thickness was increased in a pattern of mild LVH. Systolic function was normal. The estimated ejection fraction was in the range of 55% to 60%. Wall motion was normal; there were no regional wall motion abnormalities. - Mitral valve: Calcified annulus. - Left atrium: The atrium was moderately dilated. - Right atrium: The atrium was moderately dilated. - Tricuspid valve: Moderate regurgitation. - Pulmonary arteries: Systolic pressure was mildly increased. PA peak pressure: 39mm Hg (S). Impressions: - Oscillating density in RV on parasternal short axis views most likely papillary muscle      Objective:   Physical Exam Filed Vitals:   03/09/12 1114  BP: 102/80  Pulse: 65  Temp: 98.3 F (36.8 C)   Gen: Pleasant, elderly woman, in no distress,  Poor hx giver - her daughter answers the questions  ENT: No lesions,  mouth clear,  oropharynx clear, no postnasal drip  Neck: No JVD, no TMG, no carotid bruits  Lungs: No use of accessory muscles, coarse BS  without rales or rhonchi or wheezing   Cardiovascular: RRR, heart sounds normal, no murmur or gallops, trace LE edema  Musculoskeletal:  No deformities, no cyanosis or clubbing  Neuro: alert, poor STM, evidence dementia  Skin: Warm, no lesions or rashes      Assessment & Plan:  Allergic rhinitis, seasonal I believe that allergies and UA disease are driving her sx and her need for recurrent prednisone. Will ask her to go to Dr Maple Hudson to be re-tested for allergens. Also ask her to bring her old records and skin testing from before.   COPD  She may have some degree COPD vs asthma based on AFL on her spirometry. Her sx sound and have responded to therapy much more like allergic rhinitis and UA irritation (certainly the two may  interplay). She does feel benefit from BD's on most occasions. She has cycled on and off prednisone for approximately 9 months. Her control was better in the past when she was on immunotherapy - would like to see if we can restart - continue same BD's  - finish prednisone and abx, suspect she will flare again a few weeks after completed - referral for allergy 2nd opinion - rov after the allergy appt

## 2012-03-09 NOTE — Assessment & Plan Note (Addendum)
She may have some degree COPD vs asthma based on AFL on her spirometry. Her sx sound and have responded to therapy much more like allergic rhinitis and UA irritation (certainly the two may interplay). She does feel benefit from BD's on most occasions. She has cycled on and off prednisone for approximately 9 months. Her control was better in the past when she was on immunotherapy - would like to see if we can restart - continue same BD's  - finish prednisone and abx, suspect she will flare again a few weeks after completed - referral for allergy 2nd opinion - rov after the allergy appt

## 2012-03-09 NOTE — Patient Instructions (Addendum)
Foinish your prednisone and antibiotics as ordered Continue your nebulized medications  Continue your nasal sprays and loratadine We will refer you to see Dr Maple Hudson for Allergy evaluation.  Follow with Dr Delton Coombes in 3 months or sooner if you have any problems.

## 2012-03-09 NOTE — Assessment & Plan Note (Signed)
I believe that allergies and UA disease are driving her sx and her need for recurrent prednisone. Will ask her to go to Dr Maple Hudson to be re-tested for allergens. Also ask her to bring her old records and skin testing from before.

## 2012-03-13 ENCOUNTER — Other Ambulatory Visit (HOSPITAL_BASED_OUTPATIENT_CLINIC_OR_DEPARTMENT_OTHER): Payer: Medicare Other | Admitting: Lab

## 2012-03-13 DIAGNOSIS — D500 Iron deficiency anemia secondary to blood loss (chronic): Secondary | ICD-10-CM

## 2012-03-13 LAB — CBC WITH DIFFERENTIAL (CANCER CENTER ONLY)
BASO#: 0 10*3/uL (ref 0.0–0.2)
BASO%: 0.4 % (ref 0.0–2.0)
EOS%: 11.4 % — ABNORMAL HIGH (ref 0.0–7.0)
HCT: 34.9 % (ref 34.8–46.6)
HGB: 11.1 g/dL — ABNORMAL LOW (ref 11.6–15.9)
LYMPH#: 0.7 10*3/uL — ABNORMAL LOW (ref 0.9–3.3)
LYMPH%: 10 % — ABNORMAL LOW (ref 14.0–48.0)
MCHC: 31.8 g/dL — ABNORMAL LOW (ref 32.0–36.0)
MCV: 90 fL (ref 81–101)
MONO#: 0.8 10*3/uL (ref 0.1–0.9)
NEUT%: 66.8 % (ref 39.6–80.0)
RDW: 15.1 % (ref 11.1–15.7)

## 2012-03-14 ENCOUNTER — Telehealth: Payer: Self-pay | Admitting: Hematology & Oncology

## 2012-03-14 NOTE — Telephone Encounter (Signed)
Left message with 10-10 lab appointment

## 2012-03-16 ENCOUNTER — Ambulatory Visit: Payer: Self-pay | Admitting: Pharmacist

## 2012-03-16 DIAGNOSIS — I4891 Unspecified atrial fibrillation: Secondary | ICD-10-CM

## 2012-03-16 DIAGNOSIS — I639 Cerebral infarction, unspecified: Secondary | ICD-10-CM

## 2012-03-16 NOTE — Patient Instructions (Signed)
Phone call to [name] regarding INR on 9/9 of 1.8.  Pt had recent ER visit for asthma attack.  Goal is 1.8-2.3.  Will keep patient on same dose and recheck INR in [place] in apprx one month.  Her appmt is 10/10 at 10:30.

## 2012-03-16 NOTE — Progress Notes (Signed)
Phone call to Linda Margo regarding INR on 9/9 of 1.8.  Pt had recent ER visit for asthma attack.  Goal is 1.8-2.3.  Will keep patient on same dose and recheck INR in [place] in apprx one month.  Her appmt is 10/10 at 10:30.  

## 2012-04-12 ENCOUNTER — Other Ambulatory Visit (HOSPITAL_BASED_OUTPATIENT_CLINIC_OR_DEPARTMENT_OTHER): Payer: Medicare Other | Admitting: Lab

## 2012-04-12 DIAGNOSIS — D500 Iron deficiency anemia secondary to blood loss (chronic): Secondary | ICD-10-CM

## 2012-04-12 LAB — CBC WITH DIFFERENTIAL (CANCER CENTER ONLY)
BASO#: 0 10*3/uL (ref 0.0–0.2)
Eosinophils Absolute: 0.7 10*3/uL — ABNORMAL HIGH (ref 0.0–0.5)
HCT: 35.3 % (ref 34.8–46.6)
LYMPH%: 10.3 % — ABNORMAL LOW (ref 14.0–48.0)
MCH: 28.2 pg (ref 26.0–34.0)
MCV: 91 fL (ref 81–101)
MONO#: 0.9 10*3/uL (ref 0.1–0.9)
MONO%: 10.9 % (ref 0.0–13.0)
NEUT%: 70.2 % (ref 39.6–80.0)
Platelets: 390 10*3/uL (ref 145–400)
RBC: 3.87 10*6/uL (ref 3.70–5.32)
WBC: 7.9 10*3/uL (ref 3.9–10.0)

## 2012-04-12 LAB — PROTIME-INR (CHCC SATELLITE): Protime: 18 Seconds — ABNORMAL HIGH (ref 10.6–13.4)

## 2012-04-12 LAB — POCT INR: INR: 1.5

## 2012-04-13 ENCOUNTER — Ambulatory Visit: Payer: Self-pay | Admitting: Pharmacist

## 2012-04-13 DIAGNOSIS — I4891 Unspecified atrial fibrillation: Secondary | ICD-10-CM

## 2012-04-13 DIAGNOSIS — I639 Cerebral infarction, unspecified: Secondary | ICD-10-CM

## 2012-04-13 NOTE — Patient Instructions (Signed)
Spoke with daughter, [name] on phone, she thinks her mother skipped Tue dose.  She has no other changes to report.  Will keep her on 6 mg daily and recheck her INR in [place] on Tue, Oct 22 at 10:30.

## 2012-04-24 ENCOUNTER — Other Ambulatory Visit (HOSPITAL_BASED_OUTPATIENT_CLINIC_OR_DEPARTMENT_OTHER): Payer: Medicare Other | Admitting: Lab

## 2012-04-24 ENCOUNTER — Ambulatory Visit: Payer: Self-pay | Admitting: Pharmacist

## 2012-04-24 ENCOUNTER — Telehealth: Payer: Self-pay | Admitting: Hematology & Oncology

## 2012-04-24 DIAGNOSIS — I4891 Unspecified atrial fibrillation: Secondary | ICD-10-CM

## 2012-04-24 DIAGNOSIS — I639 Cerebral infarction, unspecified: Secondary | ICD-10-CM

## 2012-04-24 DIAGNOSIS — D500 Iron deficiency anemia secondary to blood loss (chronic): Secondary | ICD-10-CM

## 2012-04-24 LAB — CBC WITH DIFFERENTIAL (CANCER CENTER ONLY)
BASO%: 1.7 % (ref 0.0–2.0)
Eosinophils Absolute: 0.5 10*3/uL (ref 0.0–0.5)
LYMPH#: 0.8 10*3/uL — ABNORMAL LOW (ref 0.9–3.3)
LYMPH%: 16.1 % (ref 14.0–48.0)
MCV: 91 fL (ref 81–101)
MONO#: 0.5 10*3/uL (ref 0.1–0.9)
NEUT#: 2.8 10*3/uL (ref 1.5–6.5)
Platelets: 328 10*3/uL (ref 145–400)
RBC: 3.89 10*6/uL (ref 3.70–5.32)
RDW: 15.3 % (ref 11.1–15.7)
WBC: 4.7 10*3/uL (ref 3.9–10.0)

## 2012-04-24 LAB — PROTIME-INR (CHCC SATELLITE)
INR: 2.2 (ref 2.0–3.5)
Protime: 26.4 Seconds — ABNORMAL HIGH (ref 10.6–13.4)

## 2012-04-24 LAB — POCT INR: INR: 2.2

## 2012-04-24 NOTE — Telephone Encounter (Signed)
Melissa from [facility] in [place] called and sch lab appt for patient in November.

## 2012-04-24 NOTE — Progress Notes (Signed)
Spoke to pts daughter, [name].  Pt doing well.  INR stable on 6mg  daily.  Will check PT/INR in 1 month in [place].

## 2012-05-01 ENCOUNTER — Other Ambulatory Visit: Payer: Self-pay | Admitting: Internal Medicine

## 2012-05-01 ENCOUNTER — Ambulatory Visit (INDEPENDENT_AMBULATORY_CARE_PROVIDER_SITE_OTHER): Payer: Medicare Other | Admitting: Internal Medicine

## 2012-05-01 ENCOUNTER — Encounter: Payer: Self-pay | Admitting: Internal Medicine

## 2012-05-01 ENCOUNTER — Other Ambulatory Visit (INDEPENDENT_AMBULATORY_CARE_PROVIDER_SITE_OTHER): Payer: Medicare Other

## 2012-05-01 VITALS — BP 112/68 | HR 84 | Ht 64.0 in | Wt 161.8 lb

## 2012-05-01 DIAGNOSIS — J310 Chronic rhinitis: Secondary | ICD-10-CM

## 2012-05-01 DIAGNOSIS — J449 Chronic obstructive pulmonary disease, unspecified: Secondary | ICD-10-CM

## 2012-05-01 DIAGNOSIS — J40 Bronchitis, not specified as acute or chronic: Secondary | ICD-10-CM

## 2012-05-01 DIAGNOSIS — J302 Other seasonal allergic rhinitis: Secondary | ICD-10-CM

## 2012-05-01 DIAGNOSIS — J309 Allergic rhinitis, unspecified: Secondary | ICD-10-CM

## 2012-05-01 DIAGNOSIS — J4489 Other specified chronic obstructive pulmonary disease: Secondary | ICD-10-CM

## 2012-05-01 NOTE — Progress Notes (Signed)
05/01/12- 26 yoF never smoker referred courtesy of Dr Delton Coombes for allergy evaluation   Daughter Bonita Quin (med tech) is here Followed by Dr Delton Coombes for COPD with hx CVA, AFib, CHF To give a history of asthma with bronchitis, wheezing and shortness of breath since at least 2006. An allergist in [place] in 2009 did skin testing and treated with allergy vaccine for mold and ragweed. She says similar treatment was given 20 years previously. She moved here in 2009 and was skin tested by Dr Stevphen Rochester . Those test results are reviewed-significantly positive for weed pollens and Fusarium mold. In the past year she has been getting "sick" with head and chest congestion every 3 or 4 weeks. Usually this begins as head congestion with drainage and nasal congestion, moving into her chest. Early treatment with prednisone will abort this progression. They want to know if there is an allergy basis for the repeated episodes which are happening every 3 or 4 weeks. They do not see a seasonal pattern or specific exposure trigger. She lives in a clean house, crawl space, gas heat, no mold, no smokers. There is a dog that goes in and out.  there is no history of urticaria or unusual reaction to foods, insects, aspirin or latex.  She grew up in a smoking family. Married, living with her husband.  Prior to Admission medications   Medication Sig Start Date End Date Taking? Authorizing Provider  albuterol (PROVENTIL) (2.5 MG/3ML) 0.083% nebulizer solution Take 2.5 mg by nebulization 4 (four) times daily.    Yes Historical Provider, MD  alendronate (FOSAMAX) 70 MG tablet Take 70 mg by mouth every 7 (seven) days. Take with a full glass of water on an empty stomach.   Yes Historical Provider, MD  azelastine (ASTELIN) 137 MCG/SPRAY nasal spray Place 2 sprays into the nose 2 (two) times daily. Use in each nostril as directed 11/17/11 11/16/12 Yes Leslye Peer, MD  budesonide (PULMICORT) 0.5 MG/2ML nebulizer solution Take 0.5 mg by  nebulization 2 (two) times daily.   Yes Historical Provider, MD  Cholecalciferol (VITAMIN D) 2000 UNITS CAPS Take 1 capsule by mouth daily.    Yes Historical Provider, MD  diltiazem (DILACOR XR) 180 MG 24 hr capsule Take 180 mg by mouth daily.     Yes Historical Provider, MD  ferrous sulfate 325 (65 FE) MG tablet Take 650 mg by mouth daily. 2 tablets per day Total-   1300mg    Yes Historical Provider, MD  fluticasone (FLONASE) 50 MCG/ACT nasal spray Place 2 sprays into the nose 2 (two) times daily. 09/20/11 09/19/12 Yes Leslye Peer, MD  furosemide (LASIX) 40 MG tablet Take 40 mg by mouth daily.    Yes Historical Provider, MD  GuaiFENesin (MUCINEX PO) Take 1 tablet by mouth daily. 1800mg  daily   Yes Historical Provider, MD  loratadine (CLARITIN) 10 MG tablet Take 1 tablet (10 mg total) by mouth daily. 09/20/11 09/19/12 Yes Leslye Peer, MD  Multiple Vitamins-Minerals (MULTIVITAMINS THER. W/MINERALS) TABS Take 1 tablet by mouth daily.     Yes Historical Provider, MD  pantoprazole (PROTONIX) 40 MG tablet Take 40 mg by mouth daily.   Yes Historical Provider, MD  warfarin (COUMADIN) 6 MG tablet Take 6 mg by mouth daily.   Yes Historical Provider, MD  Calcium Carbonate-Vitamin D (CALCIUM + D PO) Take 1 tablet by mouth daily.    Historical Provider, MD   Past Medical History  Diagnosis Date  . Atrial fibrillation   .  Hypertension   . Arthritis   . GERD (gastroesophageal reflux disease)   . COPD (chronic obstructive pulmonary disease)   . PNA (pneumonia)   . Anemia due to GI blood loss 07/11/2011  . 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 Stroke 2009    aphasia and memory impairment as residual  . Hypoxia    Past Surgical History  Procedure Date  . Hip fracture surgery   . Cholecystectomy   . Knee surgery   . Cataract extraction    Family History  Problem Relation Age of Onset  . Heart disease  Mother   . Rectal cancer Mother   . Leukemia Father    History   Social History  . Marital Status: Married    Spouse Name: N/A    Number of Children: N/A  . Years of Education: N/A   Occupational History  . RETIRED    Social History Main Topics  . Smoking status: Never Smoker   . Smokeless tobacco: Never Used  . Alcohol Use: 4.2 oz/week    7 Glasses of wine per week  . Drug Use: No  . Sexually Active: No   Other Topics Concern  . Not on file   Social History Narrative  . No narrative on file   History   Social History  . Marital Status: Married    Spouse Name: N/A    Number of Children: N/A  . Years of Education: N/A   Occupational History  . RETIRED    Social History Main Topics  . Smoking status: Never Smoker   . Smokeless tobacco: Never Used  . Alcohol Use: 4.2 oz/week    7 Glasses of wine per week  . Drug Use: No  . Sexually Active: No   Other Topics Concern  . Not on file   Social History Narrative  . No narrative on file   ROS-see HPI Constitutional:   No-   weight loss, night sweats, fevers, chills, fatigue, lassitude. HEENT:   No-  headaches, difficulty swallowing, tooth/dental problems, sore throat,       No-  sneezing, itching, ear ache, +nasal congestion, post nasal drip,  CV:  No-   chest pain, orthopnea, PND, +swelling in lower extremities, anasarca,  dizziness, +palpitations Resp: +  shortness of breath with exertion or at rest.              + productive cough,  No non-productive cough,  No- coughing up of blood.              No-   change in color of mucus.  No- wheezing.   Skin: No-   rash or lesions. GI:  No-   heartburn, indigestion, abdominal pain, nausea, vomiting, diarrhea,                 change in bowel habits, loss of appetite GU: No-   dysuria, change in color of urine, no urgency or frequency.  No- flank pain. MS:  No-   joint pain or swelling.  No- decreased range of motion.  No- back pain. Neuro-     nothing unusual Psych:   No- change in mood or affect. No depression or anxiety.  No memory loss.  OBJ- Physical Exam General- Alert, Oriented, Affect-appropriate/ quiet, Distress- none acute. Her daughter did most of the talking. Skin- rash-none, lesions- none, excoriation- none Lymphadenopathy- none Head- atraumatic  Eyes- Gross vision intact, PERRLA, conjunctivae and secretions clear            Ears- Hearing, canals-normal            Nose- Clear, no-Septal dev, mucus, polyps, erosion, perforation             Throat- Mallampati II , mucosa clear , drainage- none, tonsils- atrophic. + Upper plate Neck- flexible , trachea midline, no stridor , thyroid nl, carotid no bruit Chest - symmetrical excursion , unlabored           Heart/CV- IRR/ AFib , no murmur , no gallop  , no rub, nl s1 s2                           - JVD- none , edema- none, stasis changes- none, varices- none           Lung- clear to P&A, wheeze- none, cough- none , dullness-none, rub- none           Chest wall-  Abd- tender-no, distended-no, bowel sounds-present, HSM- no Br/ Gen/ Rectal- Not done, not indicated Extrem- cyanosis- none, clubbing, none, atrophy- none, strength- nl/ using a walker Neuro- grossly intact to observation

## 2012-05-01 NOTE — Patient Instructions (Addendum)
Order- lab- ANA, sed rate, Allergy profile, Food IgE profile, Immunoglobulins (A, G, M)       Dx recurrent rhinitis, bronchitis

## 2012-05-02 LAB — IGG, IGA, IGM
IgA: 181 mg/dL (ref 69–380)
IgG (Immunoglobin G), Serum: 344 mg/dL — ABNORMAL LOW (ref 690–1700)

## 2012-05-02 LAB — ALLERGY FULL AND FOOD SPECIFIC PROFILE
Allergen,Goose feathers, e70: <0.1 kU/L
Alternaria Alternata: <0.1 kU/L
Apple: <0.1 kU/L
Bahia Grass: <0.1 kU/L
Cat Dander: <0.1 kU/L
Common Ragweed: 0.11 kU/L — ABNORMAL HIGH
Corn: <0.1 kU/L
D. farinae: <0.1 kU/L
Dog Dander: <0.1 kU/L
Fescue: <0.1 kU/L
Fish Cod: <0.1 kU/L
Goldenrod: <0.1 kU/L
Helminthosporium halodes: <0.1 kU/L
House Dust Hollister: <0.1 kU/L
Lamb's Quarters: <0.1 kU/L
Peanut IgE: <0.1 kU/L
Shrimp IgE: <0.1 kU/L
Stemphylium Botryosum: <0.1 kU/L
Sycamore Tree: <0.1 kU/L
Wheat IgE: <0.1 kU/L

## 2012-05-02 LAB — ANA: Anti Nuclear Antibody(ANA): NEGATIVE

## 2012-05-03 NOTE — Progress Notes (Signed)
Quick Note:  Pt aware of results. ______ 

## 2012-05-13 NOTE — Assessment & Plan Note (Signed)
I asked whether allergy or some other immune deficiency maybe the reason she has recurrent exacerbations. No consistent trigger has been identified. Plan-allergy profile, immunoglobulins, ANA, sedimentation rate. (She is on prednisone now)

## 2012-05-22 ENCOUNTER — Other Ambulatory Visit (HOSPITAL_BASED_OUTPATIENT_CLINIC_OR_DEPARTMENT_OTHER): Payer: Medicare Other | Admitting: Lab

## 2012-05-22 ENCOUNTER — Telehealth: Payer: Self-pay | Admitting: Pharmacist

## 2012-05-22 DIAGNOSIS — I4891 Unspecified atrial fibrillation: Secondary | ICD-10-CM

## 2012-05-23 ENCOUNTER — Telehealth: Payer: Self-pay | Admitting: Hematology & Oncology

## 2012-05-23 ENCOUNTER — Ambulatory Visit (HOSPITAL_BASED_OUTPATIENT_CLINIC_OR_DEPARTMENT_OTHER): Payer: Medicare Other | Admitting: Pharmacist

## 2012-05-23 DIAGNOSIS — I4891 Unspecified atrial fibrillation: Secondary | ICD-10-CM

## 2012-05-23 DIAGNOSIS — I639 Cerebral infarction, unspecified: Secondary | ICD-10-CM

## 2012-05-23 NOTE — Progress Notes (Signed)
INR = 1.4 on 6 mg/day I spoke w/ [name].  She says Ms Sekhon didn't miss any Coumadin doses.  Ms Frye "felt bad" on Friday so her dtr gave her 2 Prednisone on Friday & Saturday. INR subtherapeutic. Take 8 mg x 1 then back to 6 mg/day. Repeat INR on 12/4 in [place] at Linda's request. Marily Lente, Pharm.D.

## 2012-05-23 NOTE — Telephone Encounter (Signed)
Ginna from [facility] called and sent in basket message for patient to be sch for 06/06/12.

## 2012-06-01 ENCOUNTER — Encounter: Payer: Self-pay | Admitting: Internal Medicine

## 2012-06-01 ENCOUNTER — Ambulatory Visit (INDEPENDENT_AMBULATORY_CARE_PROVIDER_SITE_OTHER): Payer: Medicare Other | Admitting: Internal Medicine

## 2012-06-01 VITALS — BP 122/80 | HR 64 | Ht 65.0 in | Wt 163.4 lb

## 2012-06-01 DIAGNOSIS — J309 Allergic rhinitis, unspecified: Secondary | ICD-10-CM

## 2012-06-01 DIAGNOSIS — D801 Nonfamilial hypogammaglobulinemia: Secondary | ICD-10-CM

## 2012-06-01 DIAGNOSIS — J189 Pneumonia, unspecified organism: Secondary | ICD-10-CM

## 2012-06-01 DIAGNOSIS — J449 Chronic obstructive pulmonary disease, unspecified: Secondary | ICD-10-CM

## 2012-06-01 DIAGNOSIS — J302 Other seasonal allergic rhinitis: Secondary | ICD-10-CM

## 2012-06-01 HISTORY — DX: Nonfamilial hypogammaglobulinemia: D80.1

## 2012-06-01 MED ORDER — PREDNISONE 5 MG PO TABS: ORAL_TABLET | ORAL | Status: DC

## 2012-06-01 NOTE — Assessment & Plan Note (Signed)
Pneumonia left lung by chest x-ray 07/01/2011 Pneumonia right lower lobe 03/05/2012. Consider possibility of intermittent aspiration.

## 2012-06-01 NOTE — Assessment & Plan Note (Signed)
Etiology of her low IgG is unknown. 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  Consider replacement IVIG if necessary.

## 2012-06-01 NOTE — Progress Notes (Signed)
05/01/12- 83 yoF never smoker referred courtesy of Dr Delton Coombes for allergy evaluation   Daughter Bonita Quin (med tech) is here Followed by Dr Delton Coombes for COPD with hx CVA, AFib, CHF To give a history of asthma with bronchitis, wheezing and shortness of breath since at least 2006. An allergist in [place] in 2009 did skin testing and treated with allergy vaccine for mold and ragweed. She says similar treatment was given 20 years previously. She moved here in 2009 and was skin tested by Dr Stevphen Rochester . Those test results are reviewed-significantly positive for weed pollens and Fusarium mold. In the past year she has been getting "sick" with head and chest congestion every 3 or 4 weeks. Usually this begins as head congestion with drainage and nasal congestion, moving into her chest. Early treatment with prednisone will abort this progression. They want to know if there is an allergy basis for the repeated episodes which are happening every 3 or 4 weeks. They do not see a seasonal pattern or specific exposure trigger. She lives in a clean house, crawl space, gas heat, no mold, no smokers. There is a dog that goes in and out.  there is no history of urticaria or unusual reaction to foods, insects, aspirin or latex.  She grew up in a smoking family. Married, living with her husband.  06/01/12- 75 yoF never smoker referred courtesy of Dr Delton Coombes for allergy evaluation   Daughter Bonita Quin (med tech) is here Followed by Dr Delton Coombes for COPD with hx CVA, AFib, CHF FOLLOWS FOR: pt has had some inc congestion,prod cough , pnd,dry nose-- x1 week--denies any other concerns at this time Daughter is concerned that this is simply the beginning of another typical cycle increasing chest congestion for her mother. Daughter has been giving prednisone using left over pills of uncertain strength, 2 or 3 pills every couple of days. She is convinced the prednisone helps keep her mother stable. She had already looked up all of her mother's  lab results. Labs- Allergy Profile 05/01/12- total IgE only 32.2 with minimal specific elevation only for ragweed. She notes that mother was on allergy vaccine after moving to [place] and daughter stopped it when seemed ineffective. Immunoglobulins- 05/01/12- IgG low 344 ([phone]) ANA negative, sedimentation rate normal CXR 03/05/12- patchy right lower lobe infiltrate, hiatal hernia.   07/01/2011- left lung pneumonia Impression: Allergy is not a significant issue. 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      ROS-see HPI Constitutional:   No-   weight loss, night sweats, fevers, chills, fatigue, lassitude. HEENT:   No-  headaches, difficulty swallowing, tooth/dental problems, sore throat,       No-  sneezing, itching, ear ache, +nasal congestion, post nasal drip,  CV:  No-   chest pain, orthopnea, PND, +swelling in lower extremities, anasarca,  dizziness, +palpitations Resp: +  shortness of breath with exertion or at rest.              + productive cough,  No non-productive cough,  No- coughing up of blood.              No-   change in color of mucus.  No- wheezing.   Skin: No-   rash or lesions. GI:  No-   heartburn, indigestion, abdominal pain, nausea, vomiting, diarrhea,                 change in bowel habits, loss  of appetite GU: No-   dysuria, change in color of urine, no urgency or frequency.  No- flank pain. MS:  No-   joint pain or swelling.  No- decreased range of motion.  No- back pain. Neuro-     nothing unusual Psych:  No- change in mood or affect. No depression or anxiety.  No memory loss.  OBJ- Physical Exam General- Alert, Oriented, Affect-appropriate/ quiet, Distress- none acute. Her daughter did most of the talking. Skin- rash-none, lesions- none, excoriation- none Lymphadenopathy- none Head- atraumatic            Eyes- Gross vision intact, PERRLA, conjunctivae and  secretions clear            Ears- Hearing, canals-normal            Nose- Clear, no-Septal dev, mucus, polyps, erosion, perforation             Throat- Mallampati II , mucosa clear , drainage- none, tonsils- atrophic. + Upper plate Neck- flexible , trachea midline, no stridor , thyroid nl, carotid no bruit Chest - symmetrical excursion , unlabored           Heart/CV- IRR/ AFib , no murmur , no gallop  , no rub, nl s1 s2                           - JVD- none , edema- none, stasis changes- none, varices- none           Lung- clear to P&A, wheeze- none, cough- none , dullness-none, rub- none           Chest wall-  Abd- tender-no, distended-no, bowel sounds-present, HSM- no Br/ Gen/ Rectal- Not done, not indicated Extrem- cyanosis- none, clubbing, none, atrophy- none, strength- nl/ using a walker Neuro- grossly intact to observation

## 2012-06-01 NOTE — Assessment & Plan Note (Signed)
Repeated bronchitis seems to be steroid responsive. Daughter is very eager  to have a trial of low-dose maintenance prednisone. Alternatives would include Daliresp and/or IVIG. I had a long discussion with mother and especially daughter about side effects of maintenance prednisone, especially related to infection, bones, blood sugar and wound healing as well as adrenal insufficiency. At age 78, low-dose prednisone on a maintenance basis might be the cheapest and least toxic treatment to offer. It also addresses the strategy the daughter really wants to try.

## 2012-06-01 NOTE — Patient Instructions (Addendum)
Script sent for prednisone to try taking 10 mg (5 mg x 2) every other day  Keep your appointment with Dr Delton Coombes next week. I will be happy to see you again as needed

## 2012-06-01 NOTE — Assessment & Plan Note (Addendum)
Atopic allergy is not an important factor now. Nonspecific watery rhinorrhea might respond to ipratropium if a nonsedating antihistamine or nasal steroid is insufficient.

## 2012-06-05 ENCOUNTER — Encounter: Payer: Self-pay | Admitting: Emergency Medicine

## 2012-06-05 ENCOUNTER — Ambulatory Visit (INDEPENDENT_AMBULATORY_CARE_PROVIDER_SITE_OTHER): Payer: Medicare Other | Admitting: Emergency Medicine

## 2012-06-05 VITALS — BP 100/68 | HR 65 | Temp 98.2°F | Ht 64.0 in | Wt 165.6 lb

## 2012-06-05 DIAGNOSIS — J309 Allergic rhinitis, unspecified: Secondary | ICD-10-CM

## 2012-06-05 DIAGNOSIS — J302 Other seasonal allergic rhinitis: Secondary | ICD-10-CM

## 2012-06-05 DIAGNOSIS — J449 Chronic obstructive pulmonary disease, unspecified: Secondary | ICD-10-CM

## 2012-06-05 NOTE — Patient Instructions (Addendum)
Please continue your medications as you have been taking them, including prednisone 10mg  every other day.  Follow with Dr Delton Coombes in 6 weeks or sooner if you have any problems

## 2012-06-05 NOTE — Assessment & Plan Note (Addendum)
continue the albuterol prn, budesonide nebs bid

## 2012-06-05 NOTE — Progress Notes (Signed)
Subjective:    Patient ID: Angelica [name], female    DOB: 04/23/1923, 78 y.o.   MRN: [id number] HPI 78 yo woman, never smoker, hx of HTN, A Fib on coumadin, CAD with hx CHF (Dr Rennis Golden), CVA, dementia, allergies formerly on shots now seen at [facility] and sensitive to ragweed. Admitted x 2 12/12 for L PNA +/- pulm edema. Also with ? Dx asthma +/- COPD diagnosed recently clinically. She has had PFT remotely in [place]. Was started budesonide + albuterol for 4+ yrs, recently started on Spiriva without much clinical response. No aspiration sx. No GERD symptoms on a PPI.   ROV 11/17/11 -- pleasant 78 yo woman, hx HTN, A Fib, CAD/CHF. Has syndrome consistent w sinus drainage and UA/lower airways disease. Given dx COPD/asthma although unclear. Last time we started loratadine, fluticasone, unable to do [place].  Remains on pulmicort nebs + albuterol nebs. We stopped spiriva. Had planned for PFT today but unable to do due to some recent sinonasal gtt and disease.   ER follow up 02/02/12 -  Seen in ER on 7/20 for dyspnea, tx for COPD flare w/ steroid rx  CXR showed no acute process,  Labs neg card enzymes and BNP at 483  She is feeling better but still has on/off dyspnea and wheezing  Uses albuterol Three times a day  most days  Off budesonide neb since May.  Family wants her put on low dose steroids because she has been to ER > 3 times this year for COPD flare and gets better on steroids .  No hemotpysis or increased edema.   ROV 03/09/12 -- hx HTN, A Fib, CAD/CHF. Has syndrome consistent w sinus drainage and UA/lower airways disease. Given dx COPD/asthma although unclear. PFT with mild mixed disease 02/20/12. She reports that since her visit above with TP, she has been seen at Urgent Care for ? Flare. Taken there for lethargy, and relative hypoxemia (SpO2 90-91%).  Patient is unable to tell me her sx. She has significant nasal gtt, largely UA sx but with crossover to   PULMONARY FUNCTON TEST 02/20/2012  FVC  1.32  FEV1 .92  FEV1/FVC 69.7  FVC  % Predicted 55  FEV % Predicted 58  FeF 25-75 .57  FeF 25-75 % Predicted 1.78   ROV 06/05/12 -- hx HTN, A Fib, CAD/CHF. Has syndrome consistent w sinus drainage and UA/lower airways disease. PFT with mild mixed disease. She was seen by Dr Maple Hudson - no clear allergy to pursue (like immunotherapy); ragweed was mild, but agreed trial of scheduled pred that seems to be beneficial >> 10mg  qod. Her daughter had been pulsing her with pred depending on sx. On loratadine and fluticasone.    TTE 06/15/11:  Study Conclusions - Left ventricle: The cavity size was normal. Wall thickness was increased in a pattern of mild LVH. Systolic function was normal. The estimated ejection fraction was in the range of 55% to 60%. Wall motion was normal; there were no regional wall motion abnormalities. - Mitral valve: Calcified annulus. - Left atrium: The atrium was moderately dilated. - Right atrium: The atrium was moderately dilated. - Tricuspid valve: Moderate regurgitation. - Pulmonary arteries: Systolic pressure was mildly increased. PA peak pressure: 39mm Hg (S). Impressions: - Oscillating density in RV on parasternal short axis views most likely papillary muscle      Objective:   Physical Exam Filed Vitals:   06/05/12 1107  BP: 100/68  Pulse: 65  Temp: 98.2 F (36.8 C)  Gen: Pleasant, elderly woman, in no distress,  Poor hx giver - her daughter answers the questions  ENT: No lesions,  mouth clear,  oropharynx clear, no postnasal drip  Neck: No JVD, no TMG, no carotid bruits  Lungs: No use of accessory muscles, coarse BS  without rales or rhonchi or wheezing   Cardiovascular: RRR, heart sounds normal, no murmur or gallops, trace LE edema  Musculoskeletal: No deformities, no cyanosis or clubbing  Neuro: alert, poor STM, evidence dementia  Skin: Warm, no lesions or rashes      Assessment & Plan:  Allergic rhinitis, seasonal Daughter has been  pulsing pred, I reviewed the potential risks associated with this. We agreed to go to the 10mg  qod as recommended by DR Maple Hudson. She will continue her nasal spray regimen, loratadine  COPD  continue the albuterol prn, budesonide nebs bid

## 2012-06-05 NOTE — Assessment & Plan Note (Addendum)
Daughter has been pulsing pred, I reviewed the potential risks associated with this. We agreed to go to the 10mg  qod as recommended by DR Maple Hudson. She will continue her nasal spray regimen, loratadine

## 2012-06-06 ENCOUNTER — Ambulatory Visit: Payer: Self-pay | Admitting: Pharmacist

## 2012-06-06 ENCOUNTER — Other Ambulatory Visit (HOSPITAL_BASED_OUTPATIENT_CLINIC_OR_DEPARTMENT_OTHER): Payer: Medicare Other | Admitting: Lab

## 2012-06-06 DIAGNOSIS — I4891 Unspecified atrial fibrillation: Secondary | ICD-10-CM

## 2012-06-06 DIAGNOSIS — I639 Cerebral infarction, unspecified: Secondary | ICD-10-CM

## 2012-06-06 LAB — PROTIME-INR (CHCC SATELLITE)
INR: 2.3 (ref 2.0–3.5)
Protime: 27.6 Seconds — ABNORMAL HIGH (ref 10.6–13.4)

## 2012-06-06 NOTE — Progress Notes (Signed)
INR at goal today (2.3). No missed doses, diet changes or bleeding noted. Pt did recently start prednisone 10 mg every other day. This may effect the INR and a decrease in coumadin dose may be necessary. Will monitor closely and adjust if INR is out of range next appointment.

## 2012-06-06 NOTE — Patient Instructions (Signed)
INR at goal No changes Continue 6 mg daily Return for INR in [place] 12/19 at 10:30am

## 2012-06-21 ENCOUNTER — Telehealth: Payer: Self-pay | Admitting: Hematology & Oncology

## 2012-06-21 ENCOUNTER — Other Ambulatory Visit (HOSPITAL_BASED_OUTPATIENT_CLINIC_OR_DEPARTMENT_OTHER): Payer: Medicare Other | Admitting: Lab

## 2012-06-21 ENCOUNTER — Ambulatory Visit: Payer: Self-pay | Admitting: Pharmacist

## 2012-06-21 DIAGNOSIS — I639 Cerebral infarction, unspecified: Secondary | ICD-10-CM

## 2012-06-21 DIAGNOSIS — I4891 Unspecified atrial fibrillation: Secondary | ICD-10-CM

## 2012-06-21 LAB — PROTIME-INR (CHCC SATELLITE)
INR: 2.1 (ref 2.0–3.5)
Protime: 25.2 Seconds — ABNORMAL HIGH (ref 10.6–13.4)

## 2012-06-21 LAB — POCT INR: INR: 2.1

## 2012-06-21 NOTE — Telephone Encounter (Signed)
Melissa for [facility] called and sch 07/25/12 lab apt for patient

## 2012-06-21 NOTE — Progress Notes (Signed)
INR at goal.  The addition of prednisone does not seem to have effected INR.  Pt has appt with Dr. Cyndie Chime on 07/10/12 but no labs needed, so will check next PT/INR on 07/26/11 scheduled in [place].  I have called Dr. Dimas Millin office with [facility] [phone]) to add PT/INR on 08/22/12 lab tests, if INR stable to continue monthly lab checks.

## 2012-07-10 ENCOUNTER — Telehealth: Payer: Self-pay | Admitting: Oncology

## 2012-07-10 ENCOUNTER — Ambulatory Visit (HOSPITAL_BASED_OUTPATIENT_CLINIC_OR_DEPARTMENT_OTHER): Payer: Medicare Other | Admitting: Oncology

## 2012-07-10 VITALS — BP 131/69 | HR 76 | Temp 97.6°F | Resp 18 | Ht 60.0 in | Wt 162.4 lb

## 2012-07-10 DIAGNOSIS — Z7901 Long term (current) use of anticoagulants: Secondary | ICD-10-CM

## 2012-07-10 DIAGNOSIS — Q2733 Arteriovenous malformation of digestive system vessel: Secondary | ICD-10-CM

## 2012-07-10 DIAGNOSIS — D500 Iron deficiency anemia secondary to blood loss (chronic): Secondary | ICD-10-CM

## 2012-07-10 DIAGNOSIS — K5520 Angiodysplasia of colon without hemorrhage: Secondary | ICD-10-CM

## 2012-07-10 DIAGNOSIS — I4891 Unspecified atrial fibrillation: Secondary | ICD-10-CM

## 2012-07-10 NOTE — Telephone Encounter (Signed)
Gave pt appt for July 2014 ML only , all labs will be drawn @ [place] patient will get calendar on January 2014

## 2012-07-10 NOTE — Progress Notes (Signed)
Hematology and Oncology Follow Up Visit  Angelica [name] [id number] 1923/04/14 79 y.o. 07/10/2012 6:14 PM   Principle Diagnosis: Encounter Diagnoses  Name Primary?  . Iron deficiency anemia secondary to blood loss (chronic) Yes  . Arteriovenous malformation of gastrointestinal tract      Interim History:   Follow-up visit for this 79 year old woman with chronic GI blood loss due to AVMs.  She is on full dose Coumadin anticoagulation due to a previous stroke.  I felt aspirin was contraindicated in view of her AVMs and GI bleeding.  Her Coumadin is monitored through our office. She has had a number of medical issues over the last year. She was hospitalized one year ago in December 2012 with pneumonia and congestive heart failure. She was hospitalized in April 2013 with viral gastroenteritis/diarrhea. Hemoglobin dipped from her baseline of 11 g at that time and she was given parenteral iron. She has chronic rhinitis and uses a Proventil nebulizer and Astelin nasal spray as well as Pulmicort nebulizers. She is on chronic low-dose prednisone 5 mg daily. She has chronic atrial fibrillation on Dilacor XR.  Her daughter is concerned that we haven't been checking her iron studies frequently. Other than the minor fall in her hemoglobin during the April hospitalization, hemoglobin 7 been stable. Most recent iron panel was done January 09 2012. Serum iron 29, TIBC 349, percent saturation 8, ferritin 37. Most recent hemoglobin on October 22 was 11 g with MCV 91. Most recent protime 25.2 seconds INR 2.1 in 06/21/2012.   Medications: reviewed  Allergies:  Allergies  Allergen Reactions  . Amoxicillin Hives  . Other     Ragweed--wheezing, congestion, etc  . Penicillins Hives    Review of Systems: Constitutional:   No constitutional symptoms Respiratory: No cough or dyspnea Cardiovascular:  No chest pain or palpitations she has had some peripheral edema Gastrointestinal: No abdominal pain. No change in  bowel habits. No hematochezia Genito-Urinary: No urinary tract symptoms Musculoskeletal: Arthritis pain Neurologic: No headache or change in vision Skin: She scraped her right leg and has an abrasion which is healing. Daughter also concerned about a tiny bump on her second right toe Remaining ROS negative.  Physical Exam: Blood pressure 131/69, pulse 76, temperature 97.6 F (36.4 C), temperature source Oral, resp. rate 18, height 5' (1.524 m), weight 162 lb 6.4 oz (73.664 kg). Wt Readings from Last 3 Encounters:  07/10/12 162 lb 6.4 oz (73.664 kg)  06/05/12 165 lb 9.6 oz (75.116 kg)  06/01/12 163 lb 6.4 oz (74.118 kg)     General appearance: Well-nourished Caucasian woman HENNT: Pharynx no erythema or exudate Lymph nodes: No adenopathy Breasts: Lungs: Clear to auscultation, resonant to percussion Heart: Regular rhythm, no murmur Abdomen: Soft, nontender Extremities: 1+ ankle edema Vascular: No cyanosis Neurologic: Motor strength 5 over 5, reflexes 1+ symmetric, PERRLA, full extraocular movements Skin: Superficial laceration shin right leg; small callus distal tip second right toe  Lab Results: Lab Results  Component Value Date   WBC 4.7 04/24/2012   HGB 11.0* 04/24/2012   HCT 35.5 04/24/2012   MCV 91 04/24/2012   PLT 328 04/24/2012     Chemistry      Component Value Date/Time   NA 144 03/05/2012 1320   K 3.7 03/05/2012 1320   CL 108 03/05/2012 1320   CO2 29 03/05/2012 1320   BUN 12 03/05/2012 1320   CREATININE 0.80 03/05/2012 1320      Component Value Date/Time   CALCIUM 9.2 03/05/2012 1320  ALKPHOS 92 03/05/2012 1320   AST 15 03/05/2012 1320   ALT 13 03/05/2012 1320   BILITOT 0.2* 03/05/2012 1320       Radiological Studies: No results found.  Impression and Plan: 1. Intermittent GI blood loss due to AVMs currently stable with no active bleeding.   2. Chronic Coumadin anticoagulation status post stroke sustained in June  of 2009 with risk factor   being chronic atrial  fibrillation.  She was on aspirin when she had the stroke.  Plavix added to the  regimen.  I felt Coumadin would be safer for her, and she has had no subsequent neurologic   events or bleeding since being on the Coumadin started back in July of 2009.   3. Chronic atrial fibrillation.   4. Adult onset asthma.    CC:. Dr. Arlys John McKinsey; Dr. Levy Pupa   Levert Feinstein, MD 1/7/20146:14 PM

## 2012-07-25 ENCOUNTER — Other Ambulatory Visit: Payer: Medicare Other | Admitting: Lab

## 2012-07-25 ENCOUNTER — Ambulatory Visit: Payer: Self-pay | Admitting: Pharmacist

## 2012-07-25 ENCOUNTER — Telehealth: Payer: Self-pay | Admitting: Pharmacist

## 2012-07-25 ENCOUNTER — Other Ambulatory Visit (HOSPITAL_BASED_OUTPATIENT_CLINIC_OR_DEPARTMENT_OTHER): Payer: Medicare Other | Admitting: Lab

## 2012-07-25 DIAGNOSIS — I4891 Unspecified atrial fibrillation: Secondary | ICD-10-CM

## 2012-07-25 DIAGNOSIS — K5520 Angiodysplasia of colon without hemorrhage: Secondary | ICD-10-CM

## 2012-07-25 DIAGNOSIS — Q2733 Arteriovenous malformation of digestive system vessel: Secondary | ICD-10-CM

## 2012-07-25 DIAGNOSIS — I639 Cerebral infarction, unspecified: Secondary | ICD-10-CM

## 2012-07-25 DIAGNOSIS — D500 Iron deficiency anemia secondary to blood loss (chronic): Secondary | ICD-10-CM

## 2012-07-25 LAB — CBC WITH DIFFERENTIAL (CANCER CENTER ONLY)
BASO%: 0.6 % (ref 0.0–2.0)
HCT: 34.5 % — ABNORMAL LOW (ref 34.8–46.6)
LYMPH#: 0.8 10*3/uL — ABNORMAL LOW (ref 0.9–3.3)
MONO#: 0.8 10*3/uL (ref 0.1–0.9)
NEUT%: 66.2 % (ref 39.6–80.0)
RDW: 15.8 % — ABNORMAL HIGH (ref 11.1–15.7)
WBC: 6.2 10*3/uL (ref 3.9–10.0)

## 2012-07-25 LAB — POCT INR: INR: 1.8

## 2012-07-25 LAB — IRON AND TIBC
%SAT: 8 % — ABNORMAL LOW (ref 20–55)
Iron: 25 ug/dL — ABNORMAL LOW (ref 42–145)

## 2012-07-25 LAB — PROTIME-INR (CHCC SATELLITE): Protime: 21.6 Seconds — ABNORMAL HIGH (ref 10.6–13.4)

## 2012-07-25 NOTE — Progress Notes (Signed)
INR continues to be at goal.  Iron studies are pending.  No changes per pt daughter, [name].  Will check PT/INR on 08/22/12 at Dr. Dimas Millin office per request of Ms. Margo.  Will f/u these results.

## 2012-07-31 ENCOUNTER — Encounter: Payer: Self-pay | Admitting: Emergency Medicine

## 2012-07-31 ENCOUNTER — Ambulatory Visit (INDEPENDENT_AMBULATORY_CARE_PROVIDER_SITE_OTHER): Payer: Medicare Other | Admitting: Emergency Medicine

## 2012-07-31 VITALS — BP 120/62 | HR 75 | Temp 97.5°F | Ht 65.0 in | Wt 158.0 lb

## 2012-07-31 DIAGNOSIS — J449 Chronic obstructive pulmonary disease, unspecified: Secondary | ICD-10-CM

## 2012-07-31 NOTE — Progress Notes (Signed)
Subjective:    Patient ID: Angelica [name], female    DOB: 1923/03/18, 79 y.o.   MRN: [id number] HPI 79 yo woman, never smoker, hx of HTN, A Fib on coumadin, CAD with hx CHF (Angelica Rennis Golden), CVA, dementia, allergies formerly on shots now seen at [facility] and sensitive to ragweed. Admitted x 2 12/12 for L PNA +/- pulm edema. Also with ? Dx asthma +/- COPD diagnosed recently clinically. She has had PFT remotely in [place]. Was started budesonide + albuterol for 4+ yrs, recently started on Spiriva without much clinical response. No aspiration sx. No GERD symptoms on a PPI.   ROV 11/17/11 -- pleasant 79 yo woman, hx HTN, A Fib, CAD/CHF. Has syndrome consistent w sinus drainage and UA/lower airways disease. Given dx COPD/asthma although unclear. Last time we started loratadine, fluticasone, unable to do [place].  Remains on pulmicort nebs + albuterol nebs. We stopped spiriva. Had planned for PFT today but unable to do due to some recent sinonasal gtt and disease.   ER follow up 02/02/12 -  Seen in ER on 7/20 for dyspnea, tx for COPD flare w/ steroid rx  CXR showed no acute process,  Labs neg card enzymes and BNP at 483  She is feeling better but still has on/off dyspnea and wheezing  Uses albuterol Three times a day  most days  Off budesonide neb since May.  Family wants her put on low dose steroids because she has been to ER > 3 times this year for COPD flare and gets better on steroids .  No hemotpysis or increased edema.   ROV 03/09/12 -- hx HTN, A Fib, CAD/CHF. Has syndrome consistent w sinus drainage and UA/lower airways disease. Given dx COPD/asthma although unclear. PFT with mild mixed disease 02/20/12. She reports that since her visit above with TP, she has been seen at Urgent Care for ? Flare. Taken there for lethargy, and relative hypoxemia (SpO2 90-91%).  Patient is unable to tell me her sx. She has significant nasal gtt, largely UA sx but with crossover to   PULMONARY FUNCTON TEST 02/20/2012  FVC  1.32  FEV1 .92  FEV1/FVC 69.7  FVC  % Predicted 55  FEV % Predicted 58  FeF 25-75 .57  FeF 25-75 % Predicted 1.78   ROV 06/05/12 -- hx HTN, A Fib, CAD/CHF. Has syndrome consistent w sinus drainage and UA/lower airways disease. PFT with mild mixed disease. She was seen by Angelica [name] - no clear allergy to pursue (like immunotherapy); ragweed was mild, but agreed trial of scheduled pred that seems to be beneficial >> 10mg  qod. Her daughter had been pulsing her with pred depending on sx. On loratadine and fluticasone.   ROV 07/31/12 -- hx HTN, A Fib, CAD/CHF. Has syndrome consistent w sinus drainage and UA/lower airways disease. PFT with mild mixed disease. Last time we continued pred 10mg  qod, pulmicort, albuterol tid. Remains on fluticasone, loratadine, astelin spray. Returns for 6 week f/u. Maybe a little more nasal gtt overt the last few days. Maybe a little more swelling than usual.    TTE 06/15/11:  Study Conclusions - Left ventricle: The cavity size was normal. Wall thickness was increased in a pattern of mild LVH. Systolic function was normal. The estimated ejection fraction was in the range of 55% to 60%. Wall motion was normal; there were no regional wall motion abnormalities. - Mitral valve: Calcified annulus. - Left atrium: The atrium was moderately dilated. - Right atrium: The atrium was moderately dilated. -  Tricuspid valve: Moderate regurgitation. - Pulmonary arteries: Systolic pressure was mildly increased. PA peak pressure: 39mm Hg (S). Impressions: - Oscillating density in RV on parasternal short axis views most likely papillary muscle      Objective:   Physical Exam Filed Vitals:   07/31/12 1108  BP: 120/62  Pulse: 75  Temp: 97.5 F (36.4 C)   Gen: Pleasant, elderly woman, in no distress,  Poor hx giver - her daughter answers the questions  ENT: No lesions,  mouth clear,  oropharynx clear, no postnasal drip  Neck: No JVD, no TMG, no carotid bruits  Lungs: No  use of accessory muscles, coarse BS  without rales or rhonchi or wheezing   Cardiovascular: RRR, heart sounds normal, no murmur or gallops, trace LE edema  Musculoskeletal: No deformities, no cyanosis or clubbing  Neuro: alert, poor STM, evidence dementia  Skin: Warm, no lesions or rashes      Assessment & Plan:  COPD  Continue pulmicort and albuterol nebs on a schedule Her recurrent sx are primarily allergic rhinitis, chronic rhinitis, but controlling has been difficult. Now on pred 10mg  qod. Will continue this dosing and follow her sx. She has been better controlled since this was started. Will follow her with Angelica [name].

## 2012-07-31 NOTE — Assessment & Plan Note (Signed)
Continue pulmicort and albuterol nebs on a schedule Her recurrent sx are primarily allergic rhinitis, chronic rhinitis, but controlling has been difficult. Now on pred 10mg  qod. Will continue this dosing and follow her sx. She has been better controlled since this was started. Will follow her with Dr Maple Hudson.

## 2012-07-31 NOTE — Patient Instructions (Addendum)
Please continue your current medications the way your are taking them Follow with Dr Maple Hudson as planned Follow with Dr Delton Coombes in 4 months or sooner if you have any problems.

## 2012-08-22 LAB — POCT INR: INR: 2.1

## 2012-08-23 ENCOUNTER — Ambulatory Visit: Payer: Self-pay | Admitting: Pharmacist

## 2012-08-23 DIAGNOSIS — I639 Cerebral infarction, unspecified: Secondary | ICD-10-CM

## 2012-08-23 DIAGNOSIS — I4891 Unspecified atrial fibrillation: Secondary | ICD-10-CM

## 2012-08-23 NOTE — Patient Instructions (Signed)
Continue Coumadin 6 mg daily. Check PT/INR in [place] on 09/18/12.

## 2012-08-23 NOTE — Progress Notes (Signed)
INR drawn at [facility] on 08/22/12. Continue Coumadin 6 mg daily. Check PT/INR in [place] on 09/18/12.

## 2012-09-04 ENCOUNTER — Other Ambulatory Visit: Payer: Self-pay | Admitting: *Deleted

## 2012-09-04 NOTE — Progress Notes (Signed)
Dtr. Jeanene Erb and wanted to co-ordinate monthly lab appt.  Pt. Gets PT/INR monthly and then CBC, iron etc Q 3 months.  OK to co-ordinate and change 4/7 appt to 4/16 or 4/17 as she requested.  POF done and conversation with Rose regarding this change.

## 2012-09-13 ENCOUNTER — Telehealth: Payer: Self-pay | Admitting: Emergency Medicine

## 2012-09-13 NOTE — Telephone Encounter (Signed)
I spoke with pharmacists and they advised that they can refill the medication but insurance will not cover it until 09-16-12 so they will have to pay cash for enough for the 3 days and then insurance will cover the rest. Nothing further needed. Carron Curie, CMA

## 2012-09-18 ENCOUNTER — Other Ambulatory Visit (HOSPITAL_BASED_OUTPATIENT_CLINIC_OR_DEPARTMENT_OTHER): Payer: Medicare Other | Admitting: Lab

## 2012-09-18 ENCOUNTER — Ambulatory Visit: Payer: Self-pay | Admitting: Pharmacist

## 2012-09-18 DIAGNOSIS — D500 Iron deficiency anemia secondary to blood loss (chronic): Secondary | ICD-10-CM

## 2012-09-18 DIAGNOSIS — K5520 Angiodysplasia of colon without hemorrhage: Secondary | ICD-10-CM

## 2012-09-18 DIAGNOSIS — I639 Cerebral infarction, unspecified: Secondary | ICD-10-CM

## 2012-09-18 DIAGNOSIS — I4891 Unspecified atrial fibrillation: Secondary | ICD-10-CM

## 2012-09-18 LAB — IRON AND TIBC
%SAT: 5 % — ABNORMAL LOW (ref 20–55)
Iron: 16 ug/dL — ABNORMAL LOW (ref 42–145)
UIBC: 339 ug/dL (ref 125–400)

## 2012-09-18 LAB — PROTIME-INR (CHCC SATELLITE): INR: 1.5 — ABNORMAL LOW (ref 2.0–3.5)

## 2012-09-18 LAB — CBC WITH DIFFERENTIAL (CANCER CENTER ONLY)
BASO#: 0.1 10*3/uL (ref 0.0–0.2)
Eosinophils Absolute: 0.4 10*3/uL (ref 0.0–0.5)
HCT: 30 % — ABNORMAL LOW (ref 34.8–46.6)
HGB: 8.6 g/dL — ABNORMAL LOW (ref 11.6–15.9)
MCH: 23.3 pg — ABNORMAL LOW (ref 26.0–34.0)
MONO%: 12.5 % (ref 0.0–13.0)
NEUT#: 5.3 10*3/uL (ref 1.5–6.5)
RBC: 3.69 10*6/uL — ABNORMAL LOW (ref 3.70–5.32)

## 2012-09-18 NOTE — Patient Instructions (Addendum)
LVM with [name] to discuss coumadin dosing as mothers INR was 1.5.  Wanted to check if she missed any doses.  I instructed her to call back before 5 if possible.  If she can not return our call today then she is to take 8 mg tonight and call us in the AM to discuss further dosing.

## 2012-09-21 ENCOUNTER — Other Ambulatory Visit: Payer: Self-pay | Admitting: *Deleted

## 2012-09-21 NOTE — Progress Notes (Signed)
Called pt. And let her know that Dr. Cyndie Chime would like her to have some IV iron.  She prefers to come 3/27.  POF in.

## 2012-09-22 ENCOUNTER — Other Ambulatory Visit: Payer: Self-pay | Admitting: Oncology

## 2012-09-24 ENCOUNTER — Telehealth: Payer: Self-pay | Admitting: *Deleted

## 2012-09-24 NOTE — Telephone Encounter (Signed)
Pt's daughter, [name] called stating that she needed to confirm some labs & to discuss appts.  Returned call & left message this am to call back.  She called back this pm & ferritin, iron, & CBC results given & appt already made for 09/27/12 for fereheme at our office.  She will call [facility] office & see if that can be scheduled in [facility].

## 2012-09-25 ENCOUNTER — Other Ambulatory Visit: Payer: Self-pay | Admitting: Emergency Medicine

## 2012-09-25 ENCOUNTER — Telehealth: Payer: Self-pay | Admitting: Hematology & Oncology

## 2012-09-25 ENCOUNTER — Telehealth: Payer: Self-pay | Admitting: Oncology

## 2012-09-25 NOTE — Telephone Encounter (Signed)
Scheduled 3/27 inf. Inf was cx'd and scheduled in [place]. Per cancellation note in appt this was done at pt's request.

## 2012-09-25 NOTE — Telephone Encounter (Signed)
Pt moved 3-27 iron to [facility] [place]

## 2012-09-27 ENCOUNTER — Other Ambulatory Visit: Payer: Self-pay

## 2012-09-27 ENCOUNTER — Encounter (HOSPITAL_COMMUNITY): Payer: Self-pay | Admitting: Emergency Medicine

## 2012-09-27 ENCOUNTER — Telehealth: Payer: Self-pay | Admitting: *Deleted

## 2012-09-27 ENCOUNTER — Ambulatory Visit: Payer: Medicare Other

## 2012-09-27 ENCOUNTER — Telehealth: Payer: Self-pay | Admitting: Hematology & Oncology

## 2012-09-27 ENCOUNTER — Emergency Department (HOSPITAL_COMMUNITY): Payer: Medicare Other

## 2012-09-27 ENCOUNTER — Emergency Department (HOSPITAL_COMMUNITY)
Admission: EM | Admit: 2012-09-27 | Discharge: 2012-09-27 | Disposition: A | Discharge status: Home / Self Care | Payer: Medicare Other | Attending: Emergency Medicine | Admitting: Emergency Medicine

## 2012-09-27 DIAGNOSIS — Z8679 Personal history of other diseases of the circulatory system: Secondary | ICD-10-CM | POA: Insufficient documentation

## 2012-09-27 DIAGNOSIS — Z79899 Other long term (current) drug therapy: Secondary | ICD-10-CM | POA: Insufficient documentation

## 2012-09-27 DIAGNOSIS — Z8739 Personal history of other diseases of the musculoskeletal system and connective tissue: Secondary | ICD-10-CM | POA: Insufficient documentation

## 2012-09-27 DIAGNOSIS — Z8719 Personal history of other diseases of the digestive system: Secondary | ICD-10-CM | POA: Insufficient documentation

## 2012-09-27 DIAGNOSIS — Z8673 Personal history of transient ischemic attack (TIA), and cerebral infarction without residual deficits: Secondary | ICD-10-CM | POA: Insufficient documentation

## 2012-09-27 DIAGNOSIS — J4489 Other specified chronic obstructive pulmonary disease: Secondary | ICD-10-CM | POA: Insufficient documentation

## 2012-09-27 DIAGNOSIS — R5381 Other malaise: Secondary | ICD-10-CM | POA: Insufficient documentation

## 2012-09-27 DIAGNOSIS — K219 Gastro-esophageal reflux disease without esophagitis: Secondary | ICD-10-CM | POA: Insufficient documentation

## 2012-09-27 DIAGNOSIS — R531 Weakness: Secondary | ICD-10-CM

## 2012-09-27 DIAGNOSIS — M549 Dorsalgia, unspecified: Secondary | ICD-10-CM

## 2012-09-27 DIAGNOSIS — J449 Chronic obstructive pulmonary disease, unspecified: Secondary | ICD-10-CM | POA: Insufficient documentation

## 2012-09-27 DIAGNOSIS — D509 Iron deficiency anemia, unspecified: Secondary | ICD-10-CM | POA: Insufficient documentation

## 2012-09-27 DIAGNOSIS — I10 Essential (primary) hypertension: Secondary | ICD-10-CM | POA: Insufficient documentation

## 2012-09-27 DIAGNOSIS — Z7901 Long term (current) use of anticoagulants: Secondary | ICD-10-CM | POA: Insufficient documentation

## 2012-09-27 DIAGNOSIS — Z8709 Personal history of other diseases of the respiratory system: Secondary | ICD-10-CM | POA: Insufficient documentation

## 2012-09-27 DIAGNOSIS — IMO0002 Reserved for concepts with insufficient information to code with codable children: Secondary | ICD-10-CM | POA: Insufficient documentation

## 2012-09-27 DIAGNOSIS — Z8701 Personal history of pneumonia (recurrent): Secondary | ICD-10-CM | POA: Insufficient documentation

## 2012-09-27 LAB — COMPREHENSIVE METABOLIC PANEL
ALT: 14 U/L (ref 0–35)
AST: 13 U/L (ref 0–37)
Albumin: 3 g/dL — ABNORMAL LOW (ref 3.5–5.2)
Alkaline Phosphatase: 74 U/L (ref 39–117)
Chloride: 103 mEq/L (ref 96–112)
Potassium: 3.8 mEq/L (ref 3.5–5.1)
Sodium: 137 mEq/L (ref 135–145)
Total Bilirubin: 0.2 mg/dL — ABNORMAL LOW (ref 0.3–1.2)
Total Protein: 5.8 g/dL — ABNORMAL LOW (ref 6.0–8.3)

## 2012-09-27 LAB — CBC WITH DIFFERENTIAL/PLATELET
Basophils Absolute: 0.1 10*3/uL (ref 0.0–0.1)
Basophils Relative: 1 % (ref 0–1)
Eosinophils Absolute: 0.4 10*3/uL (ref 0.0–0.7)
Hemoglobin: 8.2 g/dL — ABNORMAL LOW (ref 12.0–15.0)
MCH: 22.5 pg — ABNORMAL LOW (ref 26.0–34.0)
MCHC: 29.1 g/dL — ABNORMAL LOW (ref 30.0–36.0)
Monocytes Relative: 10 % (ref 3–12)
Neutro Abs: 6.6 10*3/uL (ref 1.7–7.7)
Neutrophils Relative %: 76 % (ref 43–77)
Platelets: 407 10*3/uL — ABNORMAL HIGH (ref 150–400)

## 2012-09-27 LAB — URINALYSIS, ROUTINE W REFLEX MICROSCOPIC
Bilirubin Urine: NEGATIVE
Glucose, UA: NEGATIVE mg/dL
Hgb urine dipstick: NEGATIVE
Specific Gravity, Urine: 1.027 (ref 1.005–1.030)
Urobilinogen, UA: 1 mg/dL (ref 0.0–1.0)
pH: 6.5 (ref 5.0–8.0)

## 2012-09-27 LAB — PROTIME-INR: Prothrombin Time: 20 seconds — ABNORMAL HIGH (ref 11.6–15.2)

## 2012-09-27 MED ORDER — SODIUM CHLORIDE 0.9 % IV BOLUS (SEPSIS)
500.0000 mL | Freq: Once | INTRAVENOUS | Status: AC
  Administered 2012-09-27: 500 mL via INTRAVENOUS

## 2012-09-27 MED ORDER — CYCLOBENZAPRINE HCL 10 MG PO TABS
5.0000 mg | ORAL_TABLET | Freq: Once | ORAL | Status: AC
  Administered 2012-09-27: 5 mg via ORAL
  Filled 2012-09-27: qty 1

## 2012-09-27 MED ORDER — OXYCODONE-ACETAMINOPHEN 5-325 MG PO TABS
1.0000 | ORAL_TABLET | Freq: Once | ORAL | Status: AC
  Administered 2012-09-27: 1 via ORAL
  Filled 2012-09-27: qty 1

## 2012-09-27 MED ORDER — CYCLOBENZAPRINE HCL 10 MG PO TABS: 10.0000 mg | ORAL_TABLET | Freq: Two times a day (BID) | ORAL | Status: DC | PRN

## 2012-09-27 NOTE — Telephone Encounter (Signed)
Pt cx 3-27 iron is going to ER. Infusion Nurse aware

## 2012-09-27 NOTE — ED Notes (Signed)
Patient transported to X-ray 

## 2012-09-27 NOTE — Telephone Encounter (Signed)
Patient's daughter called and canceled appt for today.  The daughter is taking the patient to the hospital for back pain. They will call back to reschedule. I have contacted the [facility] office.  JMW

## 2012-09-27 NOTE — ED Notes (Signed)
Per daughter, pt c/o of nondescript back pain for the past two days and weakness.

## 2012-09-27 NOTE — ED Provider Notes (Signed)
History     CSN: [id number]  Arrival date & time 09/27/12  1014   First MD Initiated Contact with Patient 09/27/12 1018      Chief Complaint  Patient presents with  . Back Pain  . Weakness    (Consider location/radiation/quality/duration/timing/severity/associated sxs/prior treatment) The history is provided by the patient and a relative.  Angelica [name] is a 79 y.o. female history of hypertension, COPD, anemia from chronic GI loss with chronic melena, or presenting with diffuse weakness. Diffuse weakness for the past 2 days. She also has left-sided flank pain and back pain since yesterday. Denies any abdominal pain or vomiting or urinary symptoms or chest pain or fever or cough. Noticed recent changes in her medicines. She is scheduled for a iron infusion today for anemia and her hemoglobin at baseline is a 8.5. Continues to have chronic melena that is not getting worse.    Level V caveat- dementia   Past Medical History  Diagnosis Date  . Atrial fibrillation   . Hypertension   . Arthritis   . GERD (gastroesophageal reflux disease)   . COPD (chronic obstructive pulmonary disease)   . PNA (pneumonia)   . Anemia due to GI blood loss 07/11/2011  . 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 Stroke 2009    aphasia and memory impairment as residual  . Hypoxia     Past Surgical History  Procedure Laterality Date  . Hip fracture surgery    . Cholecystectomy    . Knee surgery    . Cataract extraction      Family History  Problem Relation Age of Onset  . Heart disease Mother   . Rectal cancer Mother   . Leukemia Father     History  Substance Use Topics  . Smoking status: Never Smoker   . Smokeless tobacco: Never Used  . Alcohol Use: 4.2 oz/week    7 Glasses of wine per week    OB History   Grav Para Term Preterm Abortions TAB SAB Ect Mult Living                  Review  of Systems  Musculoskeletal: Positive for back pain.  Neurological: Positive for weakness.  All other systems reviewed and are negative.    Allergies  Amoxicillin; Other; and Penicillins  Home Medications   Current Outpatient Rx  Name  Route  Sig  Dispense  Refill  . albuterol (PROVENTIL) (2.5 MG/3ML) 0.083% nebulizer solution   Nebulization   Take 2.5 mg by nebulization 4 (four) times daily.          Marland Kitchen alendronate (FOSAMAX) 70 MG tablet   Oral   Take 70 mg by mouth every 7 (seven) days. Take with a full glass of water on an empty stomach.         Marland Kitchen azelastine (ASTELIN) 137 MCG/SPRAY nasal spray   Nasal   Place 2 sprays into the nose 2 (two) times daily. Use in each nostril as directed   30 mL   12   . budesonide (PULMICORT) 0.5 MG/2ML nebulizer solution   Nebulization   Take 0.5 mg by nebulization 2 (two) times daily.         . Cholecalciferol (VITAMIN D) 2000 UNITS CAPS   Oral   Take 1 capsule by mouth daily.          Marland Kitchen diltiazem (DILACOR XR) 180 MG 24 hr  capsule   Oral   Take 180 mg by mouth daily before breakfast.          . ferrous sulfate 325 (65 FE) MG tablet   Oral   Take 650 mg by mouth daily.          . fluticasone (FLONASE) 50 MCG/ACT nasal spray      INSTILL 2 SPRAYS INTO THE NOSE 2 (TWO) TIMES DAILY.   16 g   11   . furosemide (LASIX) 40 MG tablet   Oral   Take 40-60 mg by mouth every evening. Takes 1-1.5 tablets daily as needed for swelling         . loratadine (CLARITIN) 10 MG tablet   Oral   Take 10 mg by mouth daily.         . Multiple Vitamins-Minerals (MULTIVITAMINS THER. W/MINERALS) TABS   Oral   Take 1 tablet by mouth daily.           . pantoprazole (PROTONIX) 40 MG tablet   Oral   Take 40 mg by mouth daily.         . predniSONE (DELTASONE) 5 MG tablet   Oral   Take 10 mg by mouth daily.         Marland Kitchen warfarin (COUMADIN) 6 MG tablet   Oral   Take 6 mg by mouth daily after lunch.          . EXPIRED:  loratadine (CLARITIN) 10 MG tablet   Oral   Take 1 tablet (10 mg total) by mouth daily.   30 tablet   11     BP 132/69  Pulse 68  Temp(Src) 98.9 F (37.2 C) (Oral)  Resp 21  SpO2 96%  Physical Exam  Nursing note and vitals reviewed. Constitutional:  Chronically ill, NAD   HENT:  Head: Normocephalic.  Mouth/Throat: Oropharynx is clear and moist.  Eyes: Conjunctivae are normal. Pupils are equal, round, and reactive to light.  Neck: Normal range of motion. Neck supple.  Cardiovascular: Normal rate, regular rhythm and normal heart sounds.   Pulmonary/Chest: Effort normal and breath sounds normal. No respiratory distress. She has no wheezes. She has no rales.  Abdominal: Soft. Bowel sounds are normal. She exhibits no distension. There is no tenderness. There is no rebound and no guarding.  Musculoskeletal: She exhibits no edema.  Mild L CVAT vs L paralumbar tenderness.   Neurological: She is alert.  Nl strength throughout   Skin: Skin is warm and dry.  Psychiatric: She has a normal mood and affect. Her behavior is normal. Judgment and thought content normal.    ED Course  Procedures (including critical care time)  Labs Reviewed  CBC WITH DIFFERENTIAL - Abnormal; Notable for the following:    RBC 3.64 (*)    Hemoglobin 8.2 (*)    HCT 28.2 (*)    MCV 77.5 (*)    MCH 22.5 (*)    MCHC 29.1 (*)    Platelets 407 (*)    Lymphocytes Relative 9 (*)    All other components within normal limits  COMPREHENSIVE METABOLIC PANEL - Abnormal; Notable for the following:    Total Protein 5.8 (*)    Albumin 3.0 (*)    Total Bilirubin 0.2 (*)    GFR calc non Af Amer 57 (*)    GFR calc Af Amer 66 (*)    All other components within normal limits  PROTIME-INR - Abnormal; Notable for the following:    Prothrombin  Time 20.0 (*)    INR 1.77 (*)    All other components within normal limits  URINE CULTURE  URINALYSIS, ROUTINE W REFLEX MICROSCOPIC  TROPONIN I   Dg Chest 2  View  09/27/2012  *RADIOLOGY REPORT*  Clinical Data: Weakness  CHEST - 2 VIEW  Comparison: 03/05/2012  Findings: Cardiomediastinal silhouette is stable.  Thoracic spine osteopenia.  No acute infiltrate or pleural effusion.  No pulmonary edema.  IMPRESSION: No active disease.  No significant change.   Original Report Authenticated By: Natasha Mead, M.D.      No diagnosis found.   Date: 09/27/2012  Rate: 57  Rhythm: atrial fibrillation  QRS Axis: normal  Intervals: normal  ST/T Wave abnormalities: nonspecific ST changes  Conduction Disutrbances:right bundle branch block  Narrative Interpretation:   Old EKG Reviewed: unchanged    MDM  Angelica [name] is a 79 y.o. female here with weakness. Need to r/o infections. Will get CXR, labs, UA. EKG and trop neg, not concerning for ACS.   12:49 PM Patient felt better after percocet, flexeril. CBC showed Hg stable at 8.2. INR 1.77. CXR nl, UA nl. Will d/c home on tylenol and flexeril. Daughter doesn't want her to go home on narcotics as she is unsteady to begin with.          Richardean Canal, MD 09/27/12 [phone]

## 2012-09-28 ENCOUNTER — Telehealth: Payer: Self-pay | Admitting: Hematology & Oncology

## 2012-09-28 LAB — URINE CULTURE: Culture: NO GROWTH

## 2012-09-28 NOTE — Telephone Encounter (Signed)
Patient's daughter called and resch 09/27/12 cx apt for 10/03/12

## 2012-10-02 ENCOUNTER — Other Ambulatory Visit: Payer: Self-pay | Admitting: Emergency Medicine

## 2012-10-02 ENCOUNTER — Telehealth: Payer: Self-pay | Admitting: Hematology & Oncology

## 2012-10-02 NOTE — Telephone Encounter (Signed)
Patient's daughter called and cx 10/03/12 apt and resch for 10/08/12

## 2012-10-03 ENCOUNTER — Ambulatory Visit: Payer: Medicare Other

## 2012-10-04 ENCOUNTER — Telehealth: Payer: Self-pay | Admitting: *Deleted

## 2012-10-04 DIAGNOSIS — D500 Iron deficiency anemia secondary to blood loss (chronic): Secondary | ICD-10-CM

## 2012-10-04 NOTE — Telephone Encounter (Addendum)
Received vm call from pt's daughter, Bonita Quin stating that pt's hgb is dropping & she was supposed to get an iron infusion last thurs & was in the hosp ED with a back problem & was on flexeril.  She states she is at home x 1 wk & not better & not feeling well & had to cancel appt yest.  She states that her hgb was 8.2 in the hospital & thinks she has failure to thrive & she doesn't know if this is from the anemia or what.  She reported that she would like to get her in the hospital but just doesn't know what to do.  She thinks she needs a transfusion or iron infusion but knows it will be hard to get her dressed & in the office for lab today & blood tomorrow.  She has called her PCP but they suggested calling Dr Cyndie Chime.   Call back # is [phone].  Note to Dr Cyndie Chime. Called pt's daughter back & informed that Dr Cyndie Chime is off today but was able to discuss this with him & he doesn't feel that he can admit pt with hgb 8.2 or for failure to thrive.  He suggested getting pt in the office for T&CM & set up blood transfusion same time of iron infusion.  She reports that she can't get the pt in tomorrow b/c she has to take someone for a colonoscopy.  She states she is scheduled for Monday for iron infusion at the [facility] office & would like a cbc done then & type & hold.  Orders will be placed for this.  Encouraged daughter to get pt to ED if worse

## 2012-10-06 ENCOUNTER — Inpatient Hospital Stay (HOSPITAL_COMMUNITY)
Admission: EM | Admit: 2012-10-06 | Discharge: 2012-10-11 | DRG: 292 | Disposition: A | Discharge status: Home Health | Payer: Medicare Other | Attending: Internal Medicine | Admitting: Internal Medicine

## 2012-10-06 ENCOUNTER — Encounter (HOSPITAL_COMMUNITY): Payer: Self-pay | Admitting: *Deleted

## 2012-10-06 ENCOUNTER — Emergency Department (HOSPITAL_COMMUNITY): Payer: Medicare Other

## 2012-10-06 ENCOUNTER — Inpatient Hospital Stay (HOSPITAL_COMMUNITY): Payer: Medicare Other

## 2012-10-06 DIAGNOSIS — D649 Anemia, unspecified: Secondary | ICD-10-CM

## 2012-10-06 DIAGNOSIS — I4891 Unspecified atrial fibrillation: Secondary | ICD-10-CM | POA: Diagnosis present

## 2012-10-06 DIAGNOSIS — Z7901 Long term (current) use of anticoagulants: Secondary | ICD-10-CM

## 2012-10-06 DIAGNOSIS — I5033 Acute on chronic diastolic (congestive) heart failure: Principal | ICD-10-CM | POA: Diagnosis present

## 2012-10-06 DIAGNOSIS — D509 Iron deficiency anemia, unspecified: Secondary | ICD-10-CM | POA: Diagnosis present

## 2012-10-06 DIAGNOSIS — F0390 Unspecified dementia without behavioral disturbance: Secondary | ICD-10-CM | POA: Diagnosis present

## 2012-10-06 DIAGNOSIS — Z9119 Patient's noncompliance with other medical treatment and regimen: Secondary | ICD-10-CM

## 2012-10-06 DIAGNOSIS — K5520 Angiodysplasia of colon without hemorrhage: Secondary | ICD-10-CM

## 2012-10-06 DIAGNOSIS — I4821 Permanent atrial fibrillation: Secondary | ICD-10-CM | POA: Diagnosis present

## 2012-10-06 DIAGNOSIS — K219 Gastro-esophageal reflux disease without esophagitis: Secondary | ICD-10-CM | POA: Diagnosis present

## 2012-10-06 DIAGNOSIS — K922 Gastrointestinal hemorrhage, unspecified: Secondary | ICD-10-CM | POA: Diagnosis present

## 2012-10-06 DIAGNOSIS — Z8673 Personal history of transient ischemic attack (TIA), and cerebral infarction without residual deficits: Secondary | ICD-10-CM

## 2012-10-06 DIAGNOSIS — I639 Cerebral infarction, unspecified: Secondary | ICD-10-CM

## 2012-10-06 DIAGNOSIS — R197 Diarrhea, unspecified: Secondary | ICD-10-CM

## 2012-10-06 DIAGNOSIS — Z880 Allergy status to penicillin: Secondary | ICD-10-CM

## 2012-10-06 DIAGNOSIS — Z79899 Other long term (current) drug therapy: Secondary | ICD-10-CM

## 2012-10-06 DIAGNOSIS — M549 Dorsalgia, unspecified: Secondary | ICD-10-CM

## 2012-10-06 DIAGNOSIS — E876 Hypokalemia: Secondary | ICD-10-CM

## 2012-10-06 DIAGNOSIS — R531 Weakness: Secondary | ICD-10-CM

## 2012-10-06 DIAGNOSIS — I4510 Unspecified right bundle-branch block: Secondary | ICD-10-CM | POA: Diagnosis present

## 2012-10-06 DIAGNOSIS — D500 Iron deficiency anemia secondary to blood loss (chronic): Secondary | ICD-10-CM | POA: Diagnosis present

## 2012-10-06 DIAGNOSIS — J189 Pneumonia, unspecified organism: Secondary | ICD-10-CM

## 2012-10-06 DIAGNOSIS — J449 Chronic obstructive pulmonary disease, unspecified: Secondary | ICD-10-CM | POA: Diagnosis present

## 2012-10-06 DIAGNOSIS — Z91199 Patient's noncompliance with other medical treatment and regimen due to unspecified reason: Secondary | ICD-10-CM

## 2012-10-06 DIAGNOSIS — I10 Essential (primary) hypertension: Secondary | ICD-10-CM | POA: Diagnosis present

## 2012-10-06 DIAGNOSIS — D801 Nonfamilial hypogammaglobulinemia: Secondary | ICD-10-CM

## 2012-10-06 DIAGNOSIS — J4489 Other specified chronic obstructive pulmonary disease: Secondary | ICD-10-CM | POA: Diagnosis present

## 2012-10-06 DIAGNOSIS — I509 Heart failure, unspecified: Secondary | ICD-10-CM | POA: Diagnosis present

## 2012-10-06 DIAGNOSIS — IMO0002 Reserved for concepts with insufficient information to code with codable children: Secondary | ICD-10-CM

## 2012-10-06 DIAGNOSIS — Q2733 Arteriovenous malformation of digestive system vessel: Secondary | ICD-10-CM

## 2012-10-06 DIAGNOSIS — J302 Other seasonal allergic rhinitis: Secondary | ICD-10-CM

## 2012-10-06 HISTORY — DX: Permanent atrial fibrillation: I48.21

## 2012-10-06 LAB — CBC WITH DIFFERENTIAL/PLATELET
Basophils Absolute: 0.1 10*3/uL (ref 0.0–0.1)
Basophils Relative: 1 % (ref 0–1)
Eosinophils Absolute: 0.2 10*3/uL (ref 0.0–0.7)
Eosinophils Relative: 3 % (ref 0–5)
Hemoglobin: 8 g/dL — ABNORMAL LOW (ref 12.0–15.0)
MCH: 21.6 pg — ABNORMAL LOW (ref 26.0–34.0)
MCHC: 28.7 g/dL — ABNORMAL LOW (ref 30.0–36.0)
MCV: 75.2 fL — ABNORMAL LOW (ref 78.0–100.0)
Metamyelocytes Relative: 0 %
Myelocytes: 0 %
Platelets: 396 10*3/uL (ref 150–400)
Promyelocytes Absolute: 0 %
RBC: 3.71 MIL/uL — ABNORMAL LOW (ref 3.87–5.11)

## 2012-10-06 LAB — COMPREHENSIVE METABOLIC PANEL
ALT: 12 U/L (ref 0–35)
AST: 13 U/L (ref 0–37)
Albumin: 3.1 g/dL — ABNORMAL LOW (ref 3.5–5.2)
Calcium: 9 mg/dL (ref 8.4–10.5)
Sodium: 143 mEq/L (ref 135–145)
Total Protein: 5.8 g/dL — ABNORMAL LOW (ref 6.0–8.3)

## 2012-10-06 LAB — PRO B NATRIURETIC PEPTIDE: Pro B Natriuretic peptide (BNP): 794.7 pg/mL — ABNORMAL HIGH (ref 0–450)

## 2012-10-06 LAB — TROPONIN I: Troponin I: <0.3 ng/mL (ref <0.30)

## 2012-10-06 MED ORDER — DILTIAZEM HCL ER 180 MG PO CP24
180.0000 mg | ORAL_CAPSULE | Freq: Every day | ORAL | Status: DC
  Administered 2012-10-07 – 2012-10-11 (×5): 180 mg via ORAL
  Filled 2012-10-06 (×5): qty 1

## 2012-10-06 MED ORDER — ONDANSETRON HCL 4 MG/2ML IJ SOLN
4.0000 mg | Freq: Four times a day (QID) | INTRAMUSCULAR | Status: DC | PRN
  Administered 2012-10-10: 4 mg via INTRAVENOUS
  Filled 2012-10-06: qty 2

## 2012-10-06 MED ORDER — LORATADINE 10 MG PO TABS
10.0000 mg | ORAL_TABLET | Freq: Every evening | ORAL | Status: DC
  Administered 2012-10-06 – 2012-10-10 (×5): 10 mg via ORAL
  Filled 2012-10-06 (×6): qty 1

## 2012-10-06 MED ORDER — VITAMIN D3 25 MCG (1000 UNIT) PO TABS
2000.0000 [IU] | ORAL_TABLET | Freq: Every day | ORAL | Status: DC
  Administered 2012-10-07 – 2012-10-11 (×5): 2000 [IU] via ORAL
  Filled 2012-10-06 (×5): qty 2

## 2012-10-06 MED ORDER — PREDNISONE 10 MG PO TABS
10.0000 mg | ORAL_TABLET | ORAL | Status: DC
  Administered 2012-10-08 – 2012-10-10 (×2): 10 mg via ORAL
  Filled 2012-10-06 (×2): qty 1

## 2012-10-06 MED ORDER — IPRATROPIUM BROMIDE 0.02 % IN SOLN
0.5000 mg | Freq: Once | RESPIRATORY_TRACT | Status: AC
  Administered 2012-10-06: 0.5 mg via RESPIRATORY_TRACT
  Filled 2012-10-06: qty 2.5

## 2012-10-06 MED ORDER — ALBUTEROL SULFATE (5 MG/ML) 0.5% IN NEBU
2.5000 mg | INHALATION_SOLUTION | Freq: Once | RESPIRATORY_TRACT | Status: AC
  Administered 2012-10-06: 2.5 mg via RESPIRATORY_TRACT
  Filled 2012-10-06: qty 0.5

## 2012-10-06 MED ORDER — FUROSEMIDE 10 MG/ML IJ SOLN
40.0000 mg | Freq: Once | INTRAMUSCULAR | Status: AC
  Administered 2012-10-06: 40 mg via INTRAVENOUS
  Filled 2012-10-06: qty 4

## 2012-10-06 MED ORDER — ENOXAPARIN SODIUM 30 MG/0.3ML ~~LOC~~ SOLN
30.0000 mg | SUBCUTANEOUS | Status: DC
  Filled 2012-10-06: qty 0.3

## 2012-10-06 MED ORDER — SODIUM CHLORIDE 0.9 % IV SOLN
125.0000 mg | Freq: Once | INTRAVENOUS | Status: AC
  Administered 2012-10-06: 125 mg via INTRAVENOUS
  Filled 2012-10-06: qty 10

## 2012-10-06 MED ORDER — FUROSEMIDE 10 MG/ML IJ SOLN
40.0000 mg | Freq: Two times a day (BID) | INTRAMUSCULAR | Status: DC
  Administered 2012-10-07 – 2012-10-08 (×3): 40 mg via INTRAVENOUS
  Filled 2012-10-06 (×5): qty 4

## 2012-10-06 MED ORDER — BUDESONIDE 0.5 MG/2ML IN SUSP
0.5000 mg | Freq: Two times a day (BID) | RESPIRATORY_TRACT | Status: DC
  Administered 2012-10-06 – 2012-10-11 (×10): 0.5 mg via RESPIRATORY_TRACT
  Filled 2012-10-06 (×12): qty 2

## 2012-10-06 MED ORDER — ONDANSETRON HCL 4 MG/2ML IJ SOLN: 4.0000 mg | Freq: Four times a day (QID) | INTRAMUSCULAR | Status: DC | PRN

## 2012-10-06 MED ORDER — ALBUTEROL SULFATE (5 MG/ML) 0.5% IN NEBU
2.5000 mg | INHALATION_SOLUTION | Freq: Four times a day (QID) | RESPIRATORY_TRACT | Status: DC
  Administered 2012-10-06 – 2012-10-11 (×18): 2.5 mg via RESPIRATORY_TRACT
  Filled 2012-10-06 (×20): qty 0.5

## 2012-10-06 MED ORDER — AZELASTINE HCL 0.1 % NA SOLN
2.0000 | Freq: Two times a day (BID) | NASAL | Status: DC
  Administered 2012-10-06 – 2012-10-11 (×10): 2 via NASAL
  Filled 2012-10-06: qty 30

## 2012-10-06 MED ORDER — METHYLPREDNISOLONE SODIUM SUCC 125 MG IJ SOLR
125.0000 mg | Freq: Once | INTRAMUSCULAR | Status: AC
  Administered 2012-10-06: 125 mg via INTRAVENOUS
  Filled 2012-10-06: qty 2

## 2012-10-06 MED ORDER — THERA M PLUS PO TABS: 1.0000 | ORAL_TABLET | Freq: Every day | ORAL | Status: DC

## 2012-10-06 MED ORDER — ADULT MULTIVITAMIN W/MINERALS CH
1.0000 | ORAL_TABLET | Freq: Every day | ORAL | Status: DC
  Administered 2012-10-07 – 2012-10-11 (×5): 1 via ORAL
  Filled 2012-10-06 (×5): qty 1

## 2012-10-06 MED ORDER — SODIUM CHLORIDE 0.9 % IV SOLN: 250.0000 mL | INTRAVENOUS | Status: DC | PRN

## 2012-10-06 MED ORDER — SODIUM CHLORIDE 0.9 % IJ SOLN
3.0000 mL | Freq: Two times a day (BID) | INTRAMUSCULAR | Status: DC
Start: 2012-10-06 — End: 2012-10-11
  Administered 2012-10-06 – 2012-10-11 (×8): 3 mL via INTRAVENOUS

## 2012-10-06 MED ORDER — VITAMIN D 50 MCG (2000 UT) PO CAPS: 1.0000 | ORAL_CAPSULE | Freq: Every day | ORAL | Status: DC

## 2012-10-06 MED ORDER — FERROUS SULFATE 325 (65 FE) MG PO TABS
650.0000 mg | ORAL_TABLET | Freq: Every day | ORAL | Status: DC
  Administered 2012-10-07 – 2012-10-11 (×5): 650 mg via ORAL
  Filled 2012-10-06 (×5): qty 2

## 2012-10-06 MED ORDER — WARFARIN - PHARMACIST DOSING INPATIENT: Freq: Every day | Status: DC

## 2012-10-06 MED ORDER — ACETAMINOPHEN 325 MG PO TABS: 650.0000 mg | ORAL_TABLET | ORAL | Status: DC | PRN

## 2012-10-06 MED ORDER — PANTOPRAZOLE SODIUM 40 MG PO TBEC
40.0000 mg | DELAYED_RELEASE_TABLET | Freq: Every day | ORAL | Status: DC
  Administered 2012-10-06 – 2012-10-11 (×6): 40 mg via ORAL
  Filled 2012-10-06 (×7): qty 1

## 2012-10-06 MED ORDER — OXYCODONE-ACETAMINOPHEN 5-325 MG PO TABS: 1.0000 | ORAL_TABLET | ORAL | Status: DC | PRN

## 2012-10-06 MED ORDER — CYCLOBENZAPRINE HCL 10 MG PO TABS
10.0000 mg | ORAL_TABLET | Freq: Three times a day (TID) | ORAL | Status: DC
  Administered 2012-10-06 – 2012-10-11 (×14): 10 mg via ORAL
  Filled 2012-10-06 (×17): qty 1

## 2012-10-06 MED ORDER — SODIUM CHLORIDE 0.9 % IJ SOLN: 3.0000 mL | INTRAMUSCULAR | Status: DC | PRN

## 2012-10-06 NOTE — ED Notes (Signed)
MD at bedside.  Dr. Manus Gunning visualized EKG at bedside.  Placed pt on 2L [place]; pt satting at 88-90% RA.  Placed pt on 2L [place].

## 2012-10-06 NOTE — Progress Notes (Signed)
ANTICOAGULATION CONSULT NOTE - Initial Consult  Pharmacy Consult for warfarin Indication: atrial fibrillation  Allergies  Allergen Reactions  . Amoxicillin Hives  . Other     Ragweed--wheezing, congestion, etc  . Penicillins Hives    Patient Measurements:   Heparin Dosing Weight:  Vital Signs: Temp: 98.5 F (36.9 C) (04/05 1716) Temp src: Oral (04/05 1716) BP: 133/63 mmHg (04/05 1716) Pulse Rate: 84 (04/05 1716)  Labs:  Recent Labs  10/06/12 1356  HGB 8.0*  HCT 27.9*  PLT 396  LABPROT 27.9*  INR 2.77*  CREATININE 0.79  TROPONINI <0.30    The CrCl is unknown because both a height and weight (above a minimum accepted value) are required for this calculation.   Medical History: Past Medical History  Diagnosis Date  . Atrial fibrillation   . Hypertension   . Arthritis   . GERD (gastroesophageal reflux disease)   . COPD (chronic obstructive pulmonary disease)   . PNA (pneumonia)   . Anemia due to GI blood loss 07/11/2011  . 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 Stroke 2009    aphasia and memory impairment as residual  . Hypoxia     Medications:  Scheduled:  . [COMPLETED] albuterol  2.5 mg Nebulization Once  . albuterol  2.5 mg Nebulization QID  . azelastine  2 spray Each Nare BID  . budesonide  0.5 mg Nebulization BID  . [START ON 10/07/2012] cholecalciferol  2,000 Units Oral Daily  . cyclobenzaprine  10 mg Oral TID  . [START ON 10/07/2012] diltiazem  180 mg Oral Daily  . ferric gluconate (FERRLECIT/NULECIT) IV  125 mg Intravenous Once  . [START ON 10/07/2012] ferrous sulfate  650 mg Oral Daily  . [COMPLETED] furosemide  40 mg Intravenous Once  . [START ON 10/07/2012] furosemide  40 mg Intravenous Q12H  . [COMPLETED] ipratropium  0.5 mg Nebulization Once  . loratadine  10 mg Oral QPM  . [COMPLETED] methylPREDNISolone (SOLU-MEDROL) injection  125 mg Intravenous Once   . [START ON 10/07/2012] multivitamin with minerals  1 tablet Oral Daily  . pantoprazole  40 mg Oral Daily  . [START ON 10/08/2012] predniSONE  10 mg Oral QODAY  . sodium chloride  3 mL Intravenous Q12H  . [DISCONTINUED] enoxaparin (LOVENOX) injection  30 mg Subcutaneous Q24H  . [DISCONTINUED] multivitamins ther. w/minerals  1 tablet Oral Daily  . [DISCONTINUED] Vitamin D  1 capsule Oral Daily   Infusions:    Assessment:  79 year old female with a history of hypertension, COPD, chronic atrial fibrillation, and stroke presents with two-day history of worsening shortness of breath and left flank pain. Note patient visited ER on 3/27 for left flank pain as well.  To continue warfarin for hx Afib per pharmacy dosing  Home dose of warfarin reportedly 6mg  daily - last dose 4/5  INR therapeutic  Goal of Therapy:  INR 2-3   Plan:  1) With INR therapeutic and patient already had dose today so no further doses tonight 2) Daily INR   Hessie Knows, PharmD, BCPS Pager [phone] 10/06/2012 6:45 PM

## 2012-10-06 NOTE — ED Provider Notes (Signed)
History     CSN: [id number]  Arrival date & time 10/06/12  1259   First MD Initiated Contact with Patient 10/06/12 1312      Chief Complaint  Patient presents with  . Back Pain  . Shortness of Breath    (Consider location/radiation/quality/duration/timing/severity/associated sxs/prior treatment) HPI Comments: Patient presents with 10 day history of atraumatic left sided low back pain. She was seen in ED in March 27 with similar symptoms and treated with Percocet and Flexeril. The pain is not improved and is causing the patient with discomfort in decreased mobility. Patient has a history of atrial fibrillation on Coumadin, chronic GI blood loss, COPD. Daughter states he's been wheezing since last night and having shortness of breath. Denies any dysuria or hematuria. No vaginal bleeding or discharge. Good by mouth intake and urine output. No change in bowel or bladder habits. HAs not been getting lasix at home because of difficulty getting to the bathroom.  The history is provided by the patient and a relative.    Past Medical History  Diagnosis Date  . Atrial fibrillation   . Hypertension   . Arthritis   . GERD (gastroesophageal reflux disease)   . COPD (chronic obstructive pulmonary disease)   . PNA (pneumonia)   . Anemia due to GI blood loss 07/11/2011  . 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 Stroke 2009    aphasia and memory impairment as residual  . Hypoxia     Past Surgical History  Procedure Laterality Date  . Hip fracture surgery    . Cholecystectomy    . Knee surgery    . Cataract extraction      Family History  Problem Relation Age of Onset  . Heart disease Mother   . Rectal cancer Mother   . Leukemia Father     History  Substance Use Topics  . Smoking status: Never Smoker   . Smokeless tobacco: Never Used  . Alcohol Use: 4.2 oz/week    7 Glasses of wine per  week    OB History   Grav Para Term Preterm Abortions TAB SAB Ect Mult Living                  Review of Systems  Constitutional: Positive for activity change, appetite change and fatigue. Negative for fever.  HENT: Negative for congestion and rhinorrhea.   Respiratory: Positive for cough and shortness of breath. Negative for chest tightness.   Cardiovascular: Negative for chest pain.  Gastrointestinal: Positive for blood in stool.  Genitourinary: Negative for dysuria and hematuria.  Musculoskeletal: Negative for back pain.  Skin: Negative for wound.  Neurological: Positive for weakness and light-headedness. Negative for dizziness and headaches.  A complete 10 system review of systems was obtained and all systems are negative except as noted in the HPI and PMH.    Allergies  Amoxicillin; Other; and Penicillins  Home Medications   Current Outpatient Rx  Name  Route  Sig  Dispense  Refill  . acetaminophen (TYLENOL) 500 MG tablet   Oral   Take 1,000 mg by mouth 3 (three) times daily.         Marland Kitchen albuterol (PROVENTIL) (2.5 MG/3ML) 0.083% nebulizer solution   Nebulization   Take 2.5 mg by nebulization 4 (four) times daily.          Marland Kitchen alendronate (FOSAMAX) 70 MG tablet   Oral   Take 70 mg  by mouth every 7 (seven) days. Take with a full glass of water on an empty stomach. On tuesdays         . azelastine (ASTELIN) 137 MCG/SPRAY nasal spray   Nasal   Place 2 sprays into the nose 2 (two) times daily. Use in each nostril as directed   30 mL   12   . budesonide (PULMICORT) 0.5 MG/2ML nebulizer solution   Nebulization   Take 0.5 mg by nebulization 2 (two) times daily.         . Cholecalciferol (VITAMIN D) 2000 UNITS CAPS   Oral   Take 1 capsule by mouth daily.          . cyclobenzaprine (FLEXERIL) 10 MG tablet   Oral   Take 1 tablet (10 mg total) by mouth 2 (two) times daily as needed for muscle spasms.   20 tablet   0   . diltiazem (DILACOR XR) 180 MG 24 hr  capsule   Oral   Take 180 mg by mouth daily before breakfast.          . ferrous sulfate 325 (65 FE) MG tablet   Oral   Take 650 mg by mouth daily.          . fluticasone (FLONASE) 50 MCG/ACT nasal spray   Nasal   Place 2 sprays into the nose daily.         . furosemide (LASIX) 40 MG tablet   Oral   Take 40-50 mg by mouth daily as needed (swelling).          Marland Kitchen guaiFENesin (MUCINEX) 600 MG 12 hr tablet   Oral   Take 600 mg by mouth 2 (two) times daily as needed for congestion.         Marland Kitchen loratadine (CLARITIN) 10 MG tablet   Oral   Take 10 mg by mouth every evening.          . Multiple Vitamins-Minerals (MULTIVITAMINS THER. W/MINERALS) TABS   Oral   Take 1 tablet by mouth every evening.          . pantoprazole (PROTONIX) 40 MG tablet   Oral   Take 40 mg by mouth daily.         . predniSONE (DELTASONE) 5 MG tablet   Oral   Take 10 mg by mouth every other day.          . warfarin (COUMADIN) 6 MG tablet   Oral   Take 6 mg by mouth every morning.            BP 133/63  Pulse 84  Temp(Src) 98.5 F (36.9 C) (Oral)  Resp 27  SpO2 99%  Physical Exam  Constitutional: She is oriented to person, place, and time. She appears well-developed and well-nourished. She appears distressed.  HENT:  Head: Normocephalic and atraumatic.  Mouth/Throat: Oropharynx is clear and moist. No oropharyngeal exudate.  Eyes: Conjunctivae and EOM are normal. Pupils are equal, round, and reactive to light.  Neck: Normal range of motion. Neck supple.  Cardiovascular: Normal rate, regular rhythm and normal heart sounds.   No murmur heard. Pulmonary/Chest: She is in respiratory distress. She has no wheezes. She has rales.  Mild tachypnea accessory muscle use he  Abdominal: Soft. There is no tenderness. There is no rebound and no guarding.  Musculoskeletal: Normal range of motion. She exhibits tenderness. She exhibits no edema.  L paraspinal lumbar tenderness  5/5 strength in  bilateral lower extremities. Ankle plantar  and dorsiflexion intact. Great toe extension intact bilaterally. +2 DP and PT pulses. +2 patellar reflexes bilaterally. Normal gait.   Neurological: She is alert and oriented to person, place, and time. No cranial nerve deficit. She exhibits normal muscle tone. Coordination normal.  Skin: Skin is warm.    ED Course  Procedures (including critical care time)  Labs Reviewed  CBC WITH DIFFERENTIAL - Abnormal; Notable for the following:    RBC 3.71 (*)    Hemoglobin 8.0 (*)    HCT 27.9 (*)    MCV 75.2 (*)    MCH 21.6 (*)    MCHC 28.7 (*)    RDW 15.9 (*)    Neutrophils Relative 88 (*)    Lymphocytes Relative 6 (*)    Monocytes Relative 2 (*)    Lymphs Abs 0.5 (*)    All other components within normal limits  COMPREHENSIVE METABOLIC PANEL - Abnormal; Notable for the following:    Glucose, Bld 108 (*)    Total Protein 5.8 (*)    Albumin 3.1 (*)    Total Bilirubin 0.2 (*)    GFR calc non Af Amer 73 (*)    GFR calc Af Amer 84 (*)    All other components within normal limits  PROTIME-INR - Abnormal; Notable for the following:    Prothrombin Time 27.9 (*)    INR 2.77 (*)    All other components within normal limits  PRO B NATRIURETIC PEPTIDE - Abnormal; Notable for the following:    Pro B Natriuretic peptide (BNP) 794.7 (*)    All other components within normal limits  TROPONIN I  D-DIMER, QUANTITATIVE  TYPE AND SCREEN   Ct Abdomen Pelvis Wo Contrast  10/06/2012  *RADIOLOGY REPORT*  Clinical Data: Left flank and back pain.  CT ABDOMEN AND PELVIS WITHOUT CONTRAST  Technique:  Multidetector CT imaging of the abdomen and pelvis was performed following the standard protocol without intravenous contrast.  Comparison: None.  Findings: Visualized lung bases show bibasilar atelectasis and a small right pleural effusion.  No renal obstruction identified.  There is a small nonobstructing calculus in the lower pole collecting system of the right kidney  measuring approximately 4 mm.  The bladder is unremarkable.  The gallbladder has been removed.  There is a calcified granuloma in the right lobe of the liver.  Splenic artery calcifications present without aneurysm.  No acute inflammatory process is identified.  Sigmoid diverticulosis present without evidence of diverticulitis.  There are some calcified mesenteric lymph nodes in the mid mesentery.  No hernias are identified.  Moderate degenerative disease of the lumbar spine.  IMPRESSION: No evidence of obstructing calculi.  A nonobstructing calculus is present in the lower pole of the right kidney.   Original Report Authenticated By: Irish Lack, M.D.    Dg Chest 2 View  10/06/2012  *RADIOLOGY REPORT*  Clinical Data: Shortness of breath and wheezing  CHEST - 2 VIEW  Comparison: 09/27/2012  Findings: There is moderate cardiac enlargement.  There are small pleural effusions noted bilaterally.  Mild interstitial edema present.  No airspace consolidation noted.  There is a scoliosis deformity affecting the thoracic spine.  IMPRESSION:  1. Suspect mild CHF.   Original Report Authenticated By: Signa Kell, M.D.      1. CHF (congestive heart failure)   2. Anemia   3. Back pain   4. A-fib   5. Acute on chronic diastolic CHF (congestive heart failure), NYHA class 2   6. Arteriovenous malformation of  gastrointestinal tract   7. Iron deficiency anemia secondary to blood loss (chronic)       MDM  Ten day history of low back pain it does not radiate. Associated with shortness of breath and generalized weakness. Daughter is not been given the patient her Lasix because she is not able to get to the bathroom.  Hypoxia likely secondary CHF. PE unlikely with therapeutic INR.  Hb stable from previous in past several months but was 10-11 before that. May be contributing to SOB. O2 sat 90 without O2. O2 started, lasix given. Admission d/w Dr. Arbutus Leas.     Date: 10/06/2012  Rate: 83  Rhythm: atrial  fibrillation  QRS Axis: normal  Intervals: normal  ST/T Wave abnormalities: nonspecific ST/T changes  Conduction Disutrbances:none  Narrative Interpretation:   Old EKG Reviewed: unchanged    Glynn Octave, MD 10/06/12 1747

## 2012-10-06 NOTE — H&P (Addendum)
[facility] History and Physical  Mahati Vajda ZOX:[id number] DOB: October 01, 1922 DOA: 10/06/2012   PCP: Thayer Headings, MD   Chief Complaint: left flank pain, sob  HPI:  79 year old female with a history of hypertension, COPD, chronic atrial fibrillation, and stroke presents with two-day history of worsening shortness of breath. The patient visited the emergency department on 09/27/2012 because of left flank pain. The patient was sent home after a normal chest x-ray with Flexeril. It initially helped, but the pain continued to worsen. As a result, the patient quit taking her furosemide at home because he was increasingly difficult to get into the bathroom due to pain. The patient does not weigh herself, but has noted increasing dyspnea on exertion as well as at rest now. The patient denies any fevers, chills, chest discomfort, nausea, vomiting, dizziness, abdominal pain, dysuria.  There has not been any history of recent trauma or falls. The patient did not take any NSAIDs. She has been taking Tylenol for her pain which has not been helping. The patient hasn't taken any furosemide since 09/27/2012. She has been compliant with all her other medications. Of note, the patient has a history of gastric AVMs which were diagnosed approximately 10-12 years ago. The patient has had a history of chronic melanotic stools according to the patient's daughter, but she has not visualized the stools and many weeks. The patient has had numerous positive Hemoccults, but the patient has been taking oral iron. The patient is not known to if she has had any recent hematochezia or melena. The patient has been on Coumadin for approximately 3 years secondary to her stroke from atrial fibrillation. Her INR in the emergency department was 2.77. There's not been any hematuria, hemoptysis, epistaxis, hematemesis. Iron studies on 09/18/2012 revealed iron saturation of 5%, ferritin 10. Urinalysis on 09/27/2012 was negative for  pyuria or hematuria. Additional evaluation in the emergency department revealed proBNP 794, chest x-ray showing pulmonary edema with small bilateral pleural effusions(worsen since 09/27/12), EKG with chronic right bundle branch block, hemoglobin 8.0. The patient had a hemoglobin of 10.5 on 07/25/2012. Her baseline hemoglobin is 10-11. In emergency department, the patient received aerosolized albuterol and Atrovent as well as IV Solu-Medrol 125 mg and furosemide 40 mg IV x1. Assessment/Plan: Acute on chronic diastolic CHF -Due to the patient's recent noncompliance with furosemide -Certainly a component of the patient's anemia may be contributing -Echocardiogram -Fluid restriction -Furosemide 40 mg IV twice a day -Daily weights -Consider starting carvedilol if blood pressure able to tolerate -Check TSH -Cycle troponins Iron deficiency anemia -Give a dose of ferric gluconate IV -Continue ferrous sulfate -Trend hemoglobin -Daughter wants to continue warfarin for now--states she just prefers transfusion which has "fixed" problem in past -may need GI eval--Daughter did not want bedside rectal exam -order hemoccult for next BM COPD -Hold additional steroids as the patient is not wheezing -Continue the patient's home a steroid dose, prednisone 10 mg every other day -Continue aerosolized albuterol and Atrovent Permanent atrial fibrillation -Continue Coumadin -Patient is rate controlled at this time -Continue home dose of diltiazem History of gastric AVM with melena -Daughter currently does not want bedside rectal exam -will send next stool for Hemoccult Left flank/rib pain -CT abdomen pelvis in the ED negative for etiology -CT chest -Percocet when necessary pain -Continue Flexeril CODE STATUS -Full code,verified with the patient's daughter -daughter very adamant she wants everything to be done       Past Medical History  Diagnosis Date  . Atrial fibrillation   .  Hypertension   .  Arthritis   . GERD (gastroesophageal reflux disease)   . COPD (chronic obstructive pulmonary disease)   . PNA (pneumonia)   . Anemia due to GI blood loss 07/11/2011  . 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 Stroke 2009    aphasia and memory impairment as residual  . Hypoxia    Past Surgical History  Procedure Laterality Date  . Hip fracture surgery    . Cholecystectomy    . Knee surgery    . Cataract extraction     Social History:  reports that she has never smoked. She has never used smokeless tobacco. She reports that she drinks about 4.2 ounces of alcohol per week. She reports that she does not use illicit drugs.   Family History  Problem Relation Age of Onset  . Heart disease Mother   . Rectal cancer Mother   . Leukemia Father      Allergies  Allergen Reactions  . Amoxicillin Hives  . Other     Ragweed--wheezing, congestion, etc  . Penicillins Hives      Prior to Admission medications   Medication Sig Start Date End Date Taking? Authorizing Provider  acetaminophen (TYLENOL) 500 MG tablet Take 1,000 mg by mouth 3 (three) times daily.   Yes Historical Provider, MD  albuterol (PROVENTIL) (2.5 MG/3ML) 0.083% nebulizer solution Take 2.5 mg by nebulization 4 (four) times daily.    Yes Historical Provider, MD  alendronate (FOSAMAX) 70 MG tablet Take 70 mg by mouth every 7 (seven) days. Take with a full glass of water on an empty stomach. On tuesdays   Yes Historical Provider, MD  azelastine (ASTELIN) 137 MCG/SPRAY nasal spray Place 2 sprays into the nose 2 (two) times daily. Use in each nostril as directed 11/17/11 11/16/12 Yes Leslye Peer, MD  budesonide (PULMICORT) 0.5 MG/2ML nebulizer solution Take 0.5 mg by nebulization 2 (two) times daily.   Yes Historical Provider, MD  Cholecalciferol (VITAMIN D) 2000 UNITS CAPS Take 1 capsule by mouth daily.    Yes Historical Provider, MD   cyclobenzaprine (FLEXERIL) 10 MG tablet Take 1 tablet (10 mg total) by mouth 2 (two) times daily as needed for muscle spasms. 09/27/12  Yes Richardean Canal, MD  diltiazem (DILACOR XR) 180 MG 24 hr capsule Take 180 mg by mouth daily before breakfast.    Yes Historical Provider, MD  ferrous sulfate 325 (65 FE) MG tablet Take 650 mg by mouth daily.    Yes Historical Provider, MD  fluticasone (FLONASE) 50 MCG/ACT nasal spray Place 2 sprays into the nose daily.   Yes Historical Provider, MD  furosemide (LASIX) 40 MG tablet Take 40-50 mg by mouth daily as needed (swelling).    Yes Historical Provider, MD  guaiFENesin (MUCINEX) 600 MG 12 hr tablet Take 600 mg by mouth 2 (two) times daily as needed for congestion.   Yes Historical Provider, MD  loratadine (CLARITIN) 10 MG tablet Take 10 mg by mouth every evening.    Yes Historical Provider, MD  Multiple Vitamins-Minerals (MULTIVITAMINS THER. W/MINERALS) TABS Take 1 tablet by mouth every evening.    Yes Historical Provider, MD  pantoprazole (PROTONIX) 40 MG tablet Take 40 mg by mouth daily.   Yes Historical Provider, MD  predniSONE (DELTASONE) 5 MG tablet Take 10 mg by mouth every other day.    Yes Historical Provider, MD  warfarin (COUMADIN) 6 MG tablet Take 6  mg by mouth every morning.    Yes Historical Provider, MD    Review of Systems:  Constitutional:  No weight loss, night sweats, Fevers, chills Head&Eyes: No headache.  No vision loss.  No eye pain or scotoma ENT:  No Difficulty swallowing,Tooth/dental problems,Sore throat,  No ear ache, post nasal drip,  Cardio-vascular:  No chest pain, Orthopnea, PND, swelling in lower extremities,  dizziness, palpitations  GI:  No  abdominal pain, nausea, vomiting, diarrhea, loss of appetite, hematochezia,  Resp:   No coughing up of blood .No wheezing.No chest wall deformity  Skin:  no rash or lesions.  GU:  no dysuria, change in color of urine, no urgency or frequency Musculoskeletal:  Complains of left  flank pain/rib pain with decreased range of motion Psych:  No change in mood or affect. No depression or anxiety. Neurologic: No headache, no dysesthesia, no focal weakness, no vision loss. No syncope  Physical Exam: Filed Vitals:   10/06/12 1303 10/06/12 1530  BP: 143/66   Pulse: 51   Temp: 99 F (37.2 C)   TempSrc: Oral   Resp: 24   SpO2: 95% 97%   General:  A&O x 3, NAD, nontoxic, pleasant/cooperative Head/Eye: No conjunctival hemorrhage, no icterus, [place]/AT, No nystagmus ENT:  No icterus,  No thrush, good dentition, no pharyngeal exudate Neck:  No masses, no lymphadenpathy, no bruits CV:  IRRR, no rub, no gallop, no S3 Lung:  Bibasilar crackles. No wheezes or rhonchi. Air movement. Abdomen: soft/NT, +BS, nondistended, no peritoneal signs Ext: No cyanosis, No rashes, No petechiae, No lymphangitis, 1+ edema Neuro: CNII-XII intact, strength 4/5 in bilateral upper and lower extremities, no dysmetria  Labs on Admission:  Basic Metabolic Panel:  Recent Labs Lab 10/06/12 1356  NA 143  K 3.9  CL 110  CO2 26  GLUCOSE 108*  BUN 16  CREATININE 0.79  CALCIUM 9.0   Liver Function Tests:  Recent Labs Lab 10/06/12 1356  AST 13  ALT 12  ALKPHOS 87  BILITOT 0.2*  PROT 5.8*  ALBUMIN 3.1*   No results found for this basename: LIPASE, AMYLASE,  in the last 168 hours No results found for this basename: AMMONIA,  in the last 168 hours CBC:  Recent Labs Lab 10/06/12 1356  WBC 7.6  NEUTROABS 6.6  HGB 8.0*  HCT 27.9*  MCV 75.2*  PLT 396   Cardiac Enzymes:  Recent Labs Lab 10/06/12 1356  TROPONINI <0.30   BNP: No components found with this basename: POCBNP,  CBG: No results found for this basename: GLUCAP,  in the last 168 hours  Radiological Exams on Admission: Ct Abdomen Pelvis Wo Contrast  10/06/2012  *RADIOLOGY REPORT*  Clinical Data: Left flank and back pain.  CT ABDOMEN AND PELVIS WITHOUT CONTRAST  Technique:  Multidetector CT imaging of the abdomen and  pelvis was performed following the standard protocol without intravenous contrast.  Comparison: None.  Findings: Visualized lung bases show bibasilar atelectasis and a small right pleural effusion.  No renal obstruction identified.  There is a small nonobstructing calculus in the lower pole collecting system of the right kidney measuring approximately 4 mm.  The bladder is unremarkable.  The gallbladder has been removed.  There is a calcified granuloma in the right lobe of the liver.  Splenic artery calcifications present without aneurysm.  No acute inflammatory process is identified.  Sigmoid diverticulosis present without evidence of diverticulitis.  There are some calcified mesenteric lymph nodes in the mid mesentery.  No hernias are  identified.  Moderate degenerative disease of the lumbar spine.  IMPRESSION: No evidence of obstructing calculi.  A nonobstructing calculus is present in the lower pole of the right kidney.   Original Report Authenticated By: Irish Lack, M.D.    Dg Chest 2 View  10/06/2012  *RADIOLOGY REPORT*  Clinical Data: Shortness of breath and wheezing  CHEST - 2 VIEW  Comparison: 09/27/2012  Findings: There is moderate cardiac enlargement.  There are small pleural effusions noted bilaterally.  Mild interstitial edema present.  No airspace consolidation noted.  There is a scoliosis deformity affecting the thoracic spine.  IMPRESSION:  1. Suspect mild CHF.   Original Report Authenticated By: Signa Kell, M.D.     EKG: Independently reviewed. Right bundle branch block, atrial fibrillation    Time spent:70 minutes Code Status:   Full Family Communication:   Daughter at bedside   Meriah Shands, DO  [facility] Pager [phone]  If 7PM-7AM, please contact night-coverage [email or web address] Password [email or web address] 10/06/2012, 5:11 PM

## 2012-10-06 NOTE — ED Notes (Addendum)
Pt arrives from home c/o lower back pain and cough/shortness of breath, daughter reports that pt has been bed bound and experiencing back pain since discharge last Thursday. Also reports pt is anemic and "has missed 2 appointments for iron infusions."

## 2012-10-07 DIAGNOSIS — I369 Nonrheumatic tricuspid valve disorder, unspecified: Secondary | ICD-10-CM

## 2012-10-07 DIAGNOSIS — D649 Anemia, unspecified: Secondary | ICD-10-CM

## 2012-10-07 DIAGNOSIS — Z7901 Long term (current) use of anticoagulants: Secondary | ICD-10-CM

## 2012-10-07 DIAGNOSIS — J449 Chronic obstructive pulmonary disease, unspecified: Secondary | ICD-10-CM

## 2012-10-07 LAB — CBC
MCH: 21.9 pg — ABNORMAL LOW (ref 26.0–34.0)
MCHC: 29.8 g/dL — ABNORMAL LOW (ref 30.0–36.0)
Platelets: 384 10*3/uL (ref 150–400)
RBC: 3.83 MIL/uL — ABNORMAL LOW (ref 3.87–5.11)
RDW: 15.6 % — ABNORMAL HIGH (ref 11.5–15.5)

## 2012-10-07 LAB — URINE MICROSCOPIC-ADD ON

## 2012-10-07 LAB — BASIC METABOLIC PANEL
BUN: 16 mg/dL (ref 6–23)
GFR calc non Af Amer: 74 mL/min — ABNORMAL LOW (ref >90)
Glucose, Bld: 154 mg/dL — ABNORMAL HIGH (ref 70–99)
Potassium: 3.7 mEq/L (ref 3.5–5.1)

## 2012-10-07 LAB — URINALYSIS, ROUTINE W REFLEX MICROSCOPIC
Bilirubin Urine: NEGATIVE
Protein, ur: NEGATIVE mg/dL
Urobilinogen, UA: 0.2 mg/dL (ref 0.0–1.0)

## 2012-10-07 LAB — TROPONIN I: Troponin I: <0.3 ng/mL (ref <0.30)

## 2012-10-07 MED ORDER — ALBUTEROL SULFATE (5 MG/ML) 0.5% IN NEBU: 2.5000 mg | INHALATION_SOLUTION | RESPIRATORY_TRACT | Status: DC | PRN

## 2012-10-07 MED ORDER — WARFARIN SODIUM 1 MG PO TABS
1.0000 mg | ORAL_TABLET | Freq: Once | ORAL | Status: AC
  Administered 2012-10-07: 1 mg via ORAL
  Filled 2012-10-07: qty 1

## 2012-10-07 NOTE — Progress Notes (Signed)
  Echocardiogram 2D Echocardiogram has been performed.  Angelica [name] 10/07/2012, 10:48 AM

## 2012-10-07 NOTE — Consult Note (Signed)
Reason for Consult: CHF  Requesting Physician: [facility]  HPI: This is a 79 y.o. female with a past medical history significant for CAF, COPD, chronic anticoagulation, and anemia/ AVMs. She is followed by Dr Rennis Golden in our office, LOV 8/13. She lives with her husband who has Alzheimer's. Her daughter lives down the street and helps with her medications. When Dr Rennis Golden saw her in August she was on Lasix 40mg  daily. For some reason she can't tell me she stopped this in the last couple of weeks. She is admitted now with CHF.  PMHx:  Past Medical History  Diagnosis Date  . Atrial fibrillation   . Hypertension   . Arthritis   . GERD (gastroesophageal reflux disease)   . COPD (chronic obstructive pulmonary disease)   . PNA (pneumonia)   . Anemia due to GI blood loss 07/11/2011  . 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 Stroke 2009    aphasia and memory impairment as residual  . Hypoxia    Past Surgical History  Procedure Laterality Date  . Hip fracture surgery    . Cholecystectomy    . Knee surgery    . Cataract extraction      FAMHx: Family History  Problem Relation Age of Onset  . Heart disease Mother   . Rectal cancer Mother   . Leukemia Father     SOCHx:  reports that she has never smoked. She has never used smokeless tobacco. She reports that she drinks about 4.2 ounces of alcohol per week. She reports that she does not use illicit drugs.She lives in her own home with her husband who has Alzheimer's. Her daughter lives nearby and helps with medications.  ALLERGIES: Allergies  Allergen Reactions  . Amoxicillin Hives  . Other     Ragweed--wheezing, congestion, etc  . Penicillins Hives    ROS: Pertinent items are noted in HPI.  HOME MEDICATIONS: Prescriptions prior to admission  Medication Sig Dispense Refill  . acetaminophen (TYLENOL) 500 MG tablet Take 1,000 mg by mouth 3  (three) times daily.      Marland Kitchen albuterol (PROVENTIL) (2.5 MG/3ML) 0.083% nebulizer solution Take 2.5 mg by nebulization 4 (four) times daily.       Marland Kitchen alendronate (FOSAMAX) 70 MG tablet Take 70 mg by mouth every 7 (seven) days. Take with a full glass of water on an empty stomach. On tuesdays      . azelastine (ASTELIN) 137 MCG/SPRAY nasal spray Place 2 sprays into the nose 2 (two) times daily. Use in each nostril as directed  30 mL  12  . budesonide (PULMICORT) 0.5 MG/2ML nebulizer solution Take 0.5 mg by nebulization 2 (two) times daily.      . Cholecalciferol (VITAMIN D) 2000 UNITS CAPS Take 1 capsule by mouth daily.       . cyclobenzaprine (FLEXERIL) 10 MG tablet Take 1 tablet (10 mg total) by mouth 2 (two) times daily as needed for muscle spasms.  20 tablet  0  . diltiazem (DILACOR XR) 180 MG 24 hr capsule Take 180 mg by mouth daily before breakfast.       . ferrous sulfate 325 (65 FE) MG tablet Take 650 mg by mouth daily.       . fluticasone (FLONASE) 50 MCG/ACT nasal spray Place 2 sprays into the nose daily.      . furosemide (LASIX) 40 MG tablet Take 40-50 mg by mouth daily as needed (  swelling).       Marland Kitchen guaiFENesin (MUCINEX) 600 MG 12 hr tablet Take 600 mg by mouth 2 (two) times daily as needed for congestion.      Marland Kitchen loratadine (CLARITIN) 10 MG tablet Take 10 mg by mouth every evening.       . Multiple Vitamins-Minerals (MULTIVITAMINS THER. W/MINERALS) TABS Take 1 tablet by mouth every evening.       . pantoprazole (PROTONIX) 40 MG tablet Take 40 mg by mouth daily.      . predniSONE (DELTASONE) 5 MG tablet Take 10 mg by mouth every other day.       . warfarin (COUMADIN) 6 MG tablet Take 6 mg by mouth every morning.         HOSPITAL MEDICATIONS: I have reviewed the patient's current medications.  VITALS: Blood pressure 94/68, pulse 101, temperature 98.3 F (36.8 C), temperature source Oral, resp. rate 18, height 5\' 4"  (1.626 m), weight 67.5 kg (148 lb 13 oz), SpO2 96.00%.  PHYSICAL  EXAM: General appearance: alert, cooperative and no distress Neck: no adenopathy and no carotid bruit Lungs: decreaseed breath sounds scattered ronchi Heart: irregularly irregular rhythm Abdomen: obese Extremities: trace edema Pulses: 2+ and symmetric Skin: pale Neurologic: Grossly normal  LABS: Results for orders placed during the hospital encounter of 10/06/12 (from the past 48 hour(s))  CBC WITH DIFFERENTIAL     Status: Abnormal   Collection Time    10/06/12  1:56 PM      Result Value Range   WBC 7.6  4.0 - 10.5 K/uL   RBC 3.71 (*) 3.87 - 5.11 MIL/uL   Hemoglobin 8.0 (*) 12.0 - 15.0 g/dL   HCT 16.1 (*) 09.6 - 04.5 %   MCV 75.2 (*) 78.0 - 100.0 fL   MCH 21.6 (*) 26.0 - 34.0 pg   MCHC 28.7 (*) 30.0 - 36.0 g/dL   RDW 40.9 (*) 81.1 - 91.4 %   Platelets 396  150 - 400 K/uL   Neutrophils Relative 88 (*) 43 - 77 %   Lymphocytes Relative 6 (*) 12 - 46 %   Monocytes Relative 2 (*) 3 - 12 %   Eosinophils Relative 3  0 - 5 %   Basophils Relative 1  0 - 1 %   Band Neutrophils 0  0 - 10 %   Metamyelocytes Relative 0     Myelocytes 0     Promyelocytes Absolute 0     Blasts 0     nRBC 0  0 /100 WBC   Neutro Abs 6.6  1.7 - 7.7 K/uL   Lymphs Abs 0.5 (*) 0.7 - 4.0 K/uL   Monocytes Absolute 0.2  0.1 - 1.0 K/uL   Eosinophils Absolute 0.2  0.0 - 0.7 K/uL   Basophils Absolute 0.1  0.0 - 0.1 K/uL   RBC Morphology POLYCHROMASIA PRESENT     Comment: TARGET CELLS   Smear Review LARGE PLATELETS PRESENT    COMPREHENSIVE METABOLIC PANEL     Status: Abnormal   Collection Time    10/06/12  1:56 PM      Result Value Range   Sodium 143  135 - 145 mEq/L   Potassium 3.9  3.5 - 5.1 mEq/L   Chloride 110  96 - 112 mEq/L   CO2 26  19 - 32 mEq/L   Glucose, Bld 108 (*) 70 - 99 mg/dL   BUN 16  6 - 23 mg/dL   Creatinine, Ser 7.82  0.50 - 1.10 mg/dL  Calcium 9.0  8.4 - 10.5 mg/dL   Total Protein 5.8 (*) 6.0 - 8.3 g/dL   Albumin 3.1 (*) 3.5 - 5.2 g/dL   AST 13  0 - 37 U/L   ALT 12  0 - 35 U/L    Alkaline Phosphatase 87  39 - 117 U/L   Total Bilirubin 0.2 (*) 0.3 - 1.2 mg/dL   GFR calc non Af Amer 73 (*) >90 mL/min   GFR calc Af Amer 84 (*) >90 mL/min   Comment:            The eGFR has been calculated     using the CKD EPI equation.     This calculation has not been     validated in all clinical     situations.     eGFR's persistently     <90 mL/min signify     possible Chronic Kidney Disease.  PROTIME-INR     Status: Abnormal   Collection Time    10/06/12  1:56 PM      Result Value Range   Prothrombin Time 27.9 (*) 11.6 - 15.2 seconds   INR 2.77 (*) 0.00 - 1.49  TROPONIN I     Status: None   Collection Time    10/06/12  1:56 PM      Result Value Range   Troponin I <0.30  <0.30 ng/mL   Comment:            Due to the release kinetics of cTnI,     a negative result within the first hours     of the onset of symptoms does not rule out     myocardial infarction with certainty.     If myocardial infarction is still suspected,     repeat the test at appropriate intervals.  TYPE AND SCREEN     Status: None   Collection Time    10/06/12  1:56 PM      Result Value Range   ABO/RH(D) B POS     Antibody Screen NEG     Sample Expiration 10/09/2012    D-DIMER, QUANTITATIVE     Status: None   Collection Time    10/06/12  1:56 PM      Result Value Range   D-Dimer, Quant <0.27  0.00 - 0.48 ug/mL-FEU   Comment:            AT THE INHOUSE ESTABLISHED CUTOFF     VALUE OF 0.48 ug/mL FEU,     THIS ASSAY HAS BEEN DOCUMENTED     IN THE LITERATURE TO HAVE     A SENSITIVITY AND NEGATIVE     PREDICTIVE VALUE OF AT LEAST     98 TO 99%.  THE TEST RESULT     SHOULD BE CORRELATED WITH     AN ASSESSMENT OF THE CLINICAL     PROBABILITY OF DVT / VTE.  PRO B NATRIURETIC PEPTIDE     Status: Abnormal   Collection Time    10/06/12  1:56 PM      Result Value Range   Pro B Natriuretic peptide (BNP) 794.7 (*) 0 - 450 pg/mL  TSH     Status: None   Collection Time    10/06/12  7:15 PM       Result Value Range   TSH 1.314  0.350 - 4.500 uIU/mL  TROPONIN I     Status: None   Collection Time    10/06/12  7:15 PM  Result Value Range   Troponin I <0.30  <0.30 ng/mL   Comment:            Due to the release kinetics of cTnI,     a negative result within the first hours     of the onset of symptoms does not rule out     myocardial infarction with certainty.     If myocardial infarction is still suspected,     repeat the test at appropriate intervals.  BASIC METABOLIC PANEL     Status: Abnormal   Collection Time    10/07/12 12:55 AM      Result Value Range   Sodium 142  135 - 145 mEq/L   Potassium 3.7  3.5 - 5.1 mEq/L   Chloride 107  96 - 112 mEq/L   CO2 27  19 - 32 mEq/L   Glucose, Bld 154 (*) 70 - 99 mg/dL   BUN 16  6 - 23 mg/dL   Creatinine, Ser 8.65  0.50 - 1.10 mg/dL   Calcium 8.9  8.4 - 78.4 mg/dL   GFR calc non Af Amer 74 (*) >90 mL/min   GFR calc Af Amer 85 (*) >90 mL/min   Comment:            The eGFR has been calculated     using the CKD EPI equation.     This calculation has not been     validated in all clinical     situations.     eGFR's persistently     <90 mL/min signify     possible Chronic Kidney Disease.  TROPONIN I     Status: None   Collection Time    10/07/12 12:55 AM      Result Value Range   Troponin I <0.30  <0.30 ng/mL   Comment:            Due to the release kinetics of cTnI,     a negative result within the first hours     of the onset of symptoms does not rule out     myocardial infarction with certainty.     If myocardial infarction is still suspected,     repeat the test at appropriate intervals.  PROTIME-INR     Status: Abnormal   Collection Time    10/07/12 12:55 AM      Result Value Range   Prothrombin Time 29.4 (*) 11.6 - 15.2 seconds   INR 2.98 (*) 0.00 - 1.49  URINALYSIS, ROUTINE W REFLEX MICROSCOPIC     Status: Abnormal   Collection Time    10/07/12  6:01 AM      Result Value Range   Color, Urine YELLOW  YELLOW    APPearance CLEAR  CLEAR   Specific Gravity, Urine 1.027  1.005 - 1.030   pH 6.5  5.0 - 8.0   Glucose, UA NEGATIVE  NEGATIVE mg/dL   Hgb urine dipstick SMALL (*) NEGATIVE   Bilirubin Urine NEGATIVE  NEGATIVE   Ketones, ur NEGATIVE  NEGATIVE mg/dL   Protein, ur NEGATIVE  NEGATIVE mg/dL   Urobilinogen, UA 0.2  0.0 - 1.0 mg/dL   Nitrite NEGATIVE  NEGATIVE   Leukocytes, UA NEGATIVE  NEGATIVE  URINE MICROSCOPIC-ADD ON     Status: Abnormal   Collection Time    10/07/12  6:01 AM      Result Value Range   WBC, UA 0-2  <3 WBC/hpf   RBC / HPF 3-6  <3  RBC/hpf   Crystals CA OXALATE CRYSTALS (*) NEGATIVE  CBC     Status: Abnormal   Collection Time    10/07/12  7:33 AM      Result Value Range   WBC 5.3  4.0 - 10.5 K/uL   RBC 3.83 (*) 3.87 - 5.11 MIL/uL   Hemoglobin 8.4 (*) 12.0 - 15.0 g/dL   HCT 40.9 (*) 81.1 - 91.4 %   MCV 73.6 (*) 78.0 - 100.0 fL   MCH 21.9 (*) 26.0 - 34.0 pg   MCHC 29.8 (*) 30.0 - 36.0 g/dL   RDW 78.2 (*) 95.6 - 21.3 %   Platelets 384  150 - 400 K/uL    IMAGING: Ct Abdomen Pelvis Wo Contrast  10/06/2012  *RADIOLOGY REPORT*  Clinical Data: Left flank and back pain.  CT ABDOMEN AND PELVIS WITHOUT CONTRAST  Technique:  Multidetector CT imaging of the abdomen and pelvis was performed following the standard protocol without intravenous contrast.  Comparison: None.  Findings: Visualized lung bases show bibasilar atelectasis and a small right pleural effusion.  No renal obstruction identified.  There is a small nonobstructing calculus in the lower pole collecting system of the right kidney measuring approximately 4 mm.  The bladder is unremarkable.  The gallbladder has been removed.  There is a calcified granuloma in the right lobe of the liver.  Splenic artery calcifications present without aneurysm.  No acute inflammatory process is identified.  Sigmoid diverticulosis present without evidence of diverticulitis.  There are some calcified mesenteric lymph nodes in the mid  mesentery.  No hernias are identified.  Moderate degenerative disease of the lumbar spine.  IMPRESSION: No evidence of obstructing calculi.  A nonobstructing calculus is present in the lower pole of the right kidney.   Original Report Authenticated By: Irish Lack, M.D.    Dg Chest 2 View  10/06/2012  *RADIOLOGY REPORT*  Clinical Data: Shortness of breath and wheezing  CHEST - 2 VIEW  Comparison: 09/27/2012  Findings: There is moderate cardiac enlargement.  There are small pleural effusions noted bilaterally.  Mild interstitial edema present.  No airspace consolidation noted.  There is a scoliosis deformity affecting the thoracic spine.  IMPRESSION:  1. Suspect mild CHF.   Original Report Authenticated By: Signa Kell, M.D.    Ct Chest Wo Contrast  10/06/2012  *RADIOLOGY REPORT*  Clinical Data: Dyspnea.  Left chest pain.  CT CHEST WITHOUT CONTRAST  Technique:  Multidetector CT imaging of the chest was performed following the standard protocol without IV contrast.  Comparison: 10/06/2012 chest x-ray.  No comparison chest CT.  Findings: Cardiomegaly.  Pulmonary vascular congestion.  Small bilateral pleural effusions greater on the right.  Findings consistent with congestive heart failure.  Adenopathy may be related to the congestive heart failure.  Evaluation for pulmonary embolus or dissection not possible without contrast administration.  Mild coronary calcifications.  Calcification mitral valve and aortic valve.  Calcification of the thoracic aorta with mild ectasia.  Moderate size hiatal hernia.  Scoliosis. Mildly wedging of the T8 vertebral body with loss of height most notable inferior endplate possibly remote.  This causes narrowing of the left neural foramen.  Question of this may contribute to the patient's discomfort.  No obvious rib fracture although images slightly motion degraded.  IMPRESSION: Congestive heart failure.  Please see above.  Moderate sized hiatal hernia.  Scoliosis and degenerative  changes with left T8-9 foraminal narrowing.   Original Report Authenticated By: Lacy Duverney, M.D.    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 Agree with resuming Lasix. For someone who never smoked she appears to have significant COPD. Steroids added. Will check office records for most recent echo in am.  Time Spent Directly with Patient: 45  minutes  KILROY,LUKE K 10/07/2012, 2:27 PM   I have seen and examined the patient along with Abelino Derrick, PA.  I have reviewed the chart, notes and new data.  I agree with PA's note.  Key new complaints: dyspnea improved, not resolved; no longer has flank discomfort 9 Was it due to labored breathing?) Key examination changes: bilateral wheezes and a few scattered rales, irregular rhythm Key new findings / data: Hgb stable around 8, not changed from March, but considerably lower than baseline of 10-12 from last year and January of this year. Labs c/w iron deficiency  PLAN: Acute on chronic diastolic heart failure due to interruption of diuretic therapy (memory problems? Was she told to stop it?) and worsening anemia.  Resume diuretics, treat iron deficiency - she has repeatedly required IV iron infusions and may need one now - she bears diagnosis of intestinal AVMs. Unfortunately, she has had a previous stroke and is at high risk of recurrence if warfarin is stopped.  Would recommend requesting Dr. Patsy Lager reevaluation.  Discussed sodium restriction, weight monitoring and adjustment of diuretic therapy. I think her memory problems may prevent implementation of  these measures. A minimum of home health nursing will be needed. I understand that her husband also is developing memory problems. They may need to begin considering alternative living arrangements.  Thurmon Fair, MD, [facility] [phone] 10/07/2012, 2:49 PM

## 2012-10-07 NOTE — Progress Notes (Signed)
ANTICOAGULATION CONSULT NOTE - Follow-up Consult  Pharmacy Consult for warfarin Indication: atrial fibrillation  Allergies  Allergen Reactions  . Amoxicillin Hives  . Other     Ragweed--wheezing, congestion, etc  . Penicillins Hives    Patient Measurements: Height: 5\' 4"  (162.6 cm) Weight: 148 lb 13 oz (67.5 kg) IBW/kg (Calculated) : 54.7  Vital Signs: Temp: 98.6 F (37 C) (04/06 0530) Temp src: Oral (04/06 0530) BP: 126/64 mmHg (04/06 0530) Pulse Rate: 66 (04/06 0530)  Labs:  Recent Labs  10/06/12 1356 10/06/12 1915 10/07/12 0055 10/07/12 0733  HGB 8.0*  --   --  8.4*  HCT 27.9*  --   --  28.2*  PLT 396  --   --  384  LABPROT 27.9*  --  29.4*  --   INR 2.77*  --  2.98*  --   CREATININE 0.79  --  0.76  --   TROPONINI <0.30 <0.30 <0.30  --     Estimated Creatinine Clearance: 46.8 ml/min (by C-G formula based on Cr of 0.76).   Medical History: Past Medical History  Diagnosis Date  . Atrial fibrillation   . Hypertension   . Arthritis   . GERD (gastroesophageal reflux disease)   . COPD (chronic obstructive pulmonary disease)   . PNA (pneumonia)   . Anemia due to GI blood loss 07/11/2011  . 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 Stroke 2009    aphasia and memory impairment as residual  . Hypoxia     Medications:  Scheduled:  . [COMPLETED] albuterol  2.5 mg Nebulization Once  . albuterol  2.5 mg Nebulization QID  . azelastine  2 spray Each Nare BID  . budesonide  0.5 mg Nebulization BID  . cholecalciferol  2,000 Units Oral Daily  . cyclobenzaprine  10 mg Oral TID  . diltiazem  180 mg Oral Daily  . [COMPLETED] ferric gluconate (FERRLECIT/NULECIT) IV  125 mg Intravenous Once  . ferrous sulfate  650 mg Oral Daily  . [COMPLETED] furosemide  40 mg Intravenous Once  . furosemide  40 mg Intravenous Q12H  . [COMPLETED] ipratropium  0.5 mg Nebulization Once  .  loratadine  10 mg Oral QPM  . [COMPLETED] methylPREDNISolone (SOLU-MEDROL) injection  125 mg Intravenous Once  . multivitamin with minerals  1 tablet Oral Daily  . pantoprazole  40 mg Oral Daily  . [START ON 10/08/2012] predniSONE  10 mg Oral QODAY  . sodium chloride  3 mL Intravenous Q12H  . Warfarin - Pharmacist Dosing Inpatient   Does not apply q1800  . [DISCONTINUED] enoxaparin (LOVENOX) injection  30 mg Subcutaneous Q24H  . [DISCONTINUED] multivitamins ther. w/minerals  1 tablet Oral Daily  . [DISCONTINUED] Vitamin D  1 capsule Oral Daily   Infusions:    Assessment: 79 year old female with a history of hypertension, COPD, chronic atrial fibrillation, and stroke presents with two-day history of worsening shortness of breath and left flank pain. Note patient visited ER on 3/27 for left flank pain as well.  Coumadin dosing resumed per pharmacy.   Home dose of warfarin reportedly 6mg  daily - last dose 4/5  CBC: Plts/hgb stable, no bleeding reported/documented  SCr stable  Cardiac diet  Noted specific INR goal per oncology [facility] notes in [facility].  INR supratherapeutic today with updated INR.  Will give pt very low dose tonight.    Goal of Therapy:  INR goal: 1.8 - 2.3  Plan:  1) Coumadin 1mg  po x 1 tonight 2) Daily INR  Haynes Hoehn, PharmD 10/07/2012 10:35 AM  Pager: [phone]

## 2012-10-07 NOTE — Progress Notes (Addendum)
[facility] PROGRESS NOTE  Angelica [name] ZOX:[id number] DOB: 1922/08/04 DOA: 10/06/2012 PCP: Thayer Headings, MD  Brief narrative 79 year old female with a history of hypertension, COPD, chronic atrial fibrillation, gastric AVMs, intermittent dark stools and stroke presented with two-day history of worsening shortness of breath. The patient visited the emergency department on 09/27/2012 because of left flank pain. The patient was sent home after a normal chest x-ray with Flexeril. It initially helped, but the pain continued to worsen. As a result, the patient quit taking her furosemide at home because it was increasingly difficult to get into the bathroom due to pain. She has noted increasing dyspnea on exertion as well as at rest. No history of recent trauma or falls. Additional evaluation in the emergency department revealed proBNP 794, chest x-ray showing pulmonary edema with small bilateral pleural effusions(worsen since 09/27/12), EKG with chronic right bundle branch block, hemoglobin 8.0. The patient had a hemoglobin of 10.5 on 07/25/2012. Her baseline hemoglobin is 10-11. In emergency department, the patient received aerosolized albuterol and Atrovent as well as IV Solu-Medrol 125 mg and furosemide 40 mg IV x1  Assessment/Plan: 1. Acute on chronic diastolic CHF: Secondary to recent noncompliance with diuretics. Chronic anemia may be contributing some. Clinically improving. Followup 2-D echo results. Cardiology consulted for further assistance in management. 2. Iron deficiency anemia: Hemoglobin stable in the low 8 g per DL range since mid-March 8119. No indication for transfusion at this time. Check FOBT. Continue iron supplements. S/P IV Iron on 4/5. May consider GI consultation as OP. 3. COPD: Mild wheezing. Continue home chronic prednisone 10 mg every other day. Bronchodilator nebulizations. Probably made worse by decompensated CHF. 4. Permanent atrial fibrillation: Rate controlled.  Anticoagulated. Continue Cardizem. Cardiology consulted. 5. Left flank pain: CT chest and abdomen without acute findings. Per patient, no pain today.? Radicular pain from degenerative thoracic spine disease. 6. HTN: Controlled. 7. History of gastric AVMs and? Intermittent melena: Check FOBT. Hemoglobin stable. PPIs. 8. History of CVA: Anticoagulated on Coumadin.  Code Status: Full Family Communication: Left message for Ms. [name], daughter. Disposition Plan: Home when medically stable.   Consultants:  Cardiology-[facility]  Procedures:  None  Antibiotics:  None   HPI/Subjective: Poor historian. Denies left flank or chest pain. Cannot tell of dyspnea is better or not.  Objective: Filed Vitals:   10/06/12 2204 10/07/12 0530 10/07/12 0935 10/07/12 1308  BP: 134/66 126/64  94/68  Pulse: 68 66  101  Temp: 98.6 F (37 C) 98.6 F (37 C)  98.3 F (36.8 C)  TempSrc: Oral Oral  Oral  Resp: 20 19  18   Height:      Weight:  67.5 kg (148 lb 13 oz)    SpO2: 90% 97% 96% 96%    Intake/Output Summary (Last 24 hours) at 10/07/12 1414 Last data filed at 10/07/12 1308  Gross per 24 hour  Intake    430 ml  Output   1050 ml  Net   -620 ml   Filed Weights   10/06/12 1716 10/07/12 0530  Weight: 67.2 kg (148 lb 2.4 oz) 67.5 kg (148 lb 13 oz)    Exam:   General exam: Comfortable. Sitting on chair.  Respiratory system: Reduced breath sounds bilaterally with scattered few expiratory rhonchi and basal crackles. No increased work of breathing.  Cardiovascular system: S1 & S2 heard, irregular. No JVD, murmurs, gallops, clicks. 1-2+ bilateral leg edema. Telemetry: A. fib with CVR  Gastrointestinal system: Abdomen is nondistended, soft and nontender. Normal bowel sounds heard.  Central nervous system: Alert and oriented only to self. No focal neurological deficits.? Hard of hearing  Extremities: Symmetric 5 x 5 power.   Data Reviewed: Basic Metabolic Panel:  Recent Labs Lab  10/06/12 1356 10/07/12 0055  NA 143 142  K 3.9 3.7  CL 110 107  CO2 26 27  GLUCOSE 108* 154*  BUN 16 16  CREATININE 0.79 0.76  CALCIUM 9.0 8.9   Liver Function Tests:  Recent Labs Lab 10/06/12 1356  AST 13  ALT 12  ALKPHOS 87  BILITOT 0.2*  PROT 5.8*  ALBUMIN 3.1*   No results found for this basename: LIPASE, AMYLASE,  in the last 168 hours No results found for this basename: AMMONIA,  in the last 168 hours CBC:  Recent Labs Lab 10/06/12 1356 10/07/12 0733  WBC 7.6 5.3  NEUTROABS 6.6  --   HGB 8.0* 8.4*  HCT 27.9* 28.2*  MCV 75.2* 73.6*  PLT 396 384   Cardiac Enzymes:  Recent Labs Lab 10/06/12 1356 10/06/12 1915 10/07/12 0055  TROPONINI <0.30 <0.30 <0.30   BNP (last 3 results)  Recent Labs  01/21/12 0406 03/05/12 1320 10/06/12 1356  PROBNP 483.1* 1140.0* 794.7*   CBG: No results found for this basename: GLUCAP,  in the last 168 hours  No results found for this or any previous visit (from the past 240 hour(s)).   Studies: Ct Abdomen Pelvis Wo Contrast  10/06/2012  *RADIOLOGY REPORT*  Clinical Data: Left flank and back pain.  CT ABDOMEN AND PELVIS WITHOUT CONTRAST  Technique:  Multidetector CT imaging of the abdomen and pelvis was performed following the standard protocol without intravenous contrast.  Comparison: None.  Findings: Visualized lung bases show bibasilar atelectasis and a small right pleural effusion.  No renal obstruction identified.  There is a small nonobstructing calculus in the lower pole collecting system of the right kidney measuring approximately 4 mm.  The bladder is unremarkable.  The gallbladder has been removed.  There is a calcified granuloma in the right lobe of the liver.  Splenic artery calcifications present without aneurysm.  No acute inflammatory process is identified.  Sigmoid diverticulosis present without evidence of diverticulitis.  There are some calcified mesenteric lymph nodes in the mid mesentery.  No hernias are  identified.  Moderate degenerative disease of the lumbar spine.  IMPRESSION: No evidence of obstructing calculi.  A nonobstructing calculus is present in the lower pole of the right kidney.   Original Report Authenticated By: Irish Lack, M.D.    Dg Chest 2 View  10/06/2012  *RADIOLOGY REPORT*  Clinical Data: Shortness of breath and wheezing  CHEST - 2 VIEW  Comparison: 09/27/2012  Findings: There is moderate cardiac enlargement.  There are small pleural effusions noted bilaterally.  Mild interstitial edema present.  No airspace consolidation noted.  There is a scoliosis deformity affecting the thoracic spine.  IMPRESSION:  1. Suspect mild CHF.   Original Report Authenticated By: Signa Kell, M.D.    Ct Chest Wo Contrast  10/06/2012  *RADIOLOGY REPORT*  Clinical Data: Dyspnea.  Left chest pain.  CT CHEST WITHOUT CONTRAST  Technique:  Multidetector CT imaging of the chest was performed following the standard protocol without IV contrast.  Comparison: 10/06/2012 chest x-ray.  No comparison chest CT.  Findings: Cardiomegaly.  Pulmonary vascular congestion.  Small bilateral pleural effusions greater on the right.  Findings consistent with congestive heart failure.  Adenopathy may be related to the congestive heart failure.  Evaluation for pulmonary embolus or dissection  not possible without contrast administration.  Mild coronary calcifications.  Calcification mitral valve and aortic valve.  Calcification of the thoracic aorta with mild ectasia.  Moderate size hiatal hernia.  Scoliosis. Mildly wedging of the T8 vertebral body with loss of height most notable inferior endplate possibly remote.  This causes narrowing of the left neural foramen.  Question of this may contribute to the patient's discomfort.  No obvious rib fracture although images slightly motion degraded.  IMPRESSION: Congestive heart failure.  Please see above.  Moderate sized hiatal hernia.  Scoliosis and degenerative changes with left T8-9  foraminal narrowing.   Original Report Authenticated By: Lacy Duverney, M.D.      Additional labs:   Scheduled Meds: . albuterol  2.5 mg Nebulization QID  . azelastine  2 spray Each Nare BID  . budesonide  0.5 mg Nebulization BID  . cholecalciferol  2,000 Units Oral Daily  . cyclobenzaprine  10 mg Oral TID  . diltiazem  180 mg Oral Daily  . ferrous sulfate  650 mg Oral Daily  . furosemide  40 mg Intravenous Q12H  . loratadine  10 mg Oral QPM  . multivitamin with minerals  1 tablet Oral Daily  . pantoprazole  40 mg Oral Daily  . [START ON 10/08/2012] predniSONE  10 mg Oral QODAY  . sodium chloride  3 mL Intravenous Q12H  . warfarin  1 mg Oral ONCE-1800  . Warfarin - Pharmacist Dosing Inpatient   Does not apply q1800   Continuous Infusions:   Active Problems:   A-fib, rate controlled on coumadin   COPD    Iron deficiency anemia secondary to blood loss (chronic)   Arteriovenous malformation of gastrointestinal tract   Acute on chronic diastolic CHF (congestive heart failure), NYHA class 2    Time spent: 45 minutes    [facility]  [facility] Pager [phone].   If 8PM-8AM, please contact night-coverage at [email or web address], password [facility] 10/07/2012, 2:14 PM  LOS: 1 day

## 2012-10-08 ENCOUNTER — Telehealth: Payer: Self-pay | Admitting: Hematology & Oncology

## 2012-10-08 ENCOUNTER — Ambulatory Visit: Payer: Medicare Other

## 2012-10-08 ENCOUNTER — Other Ambulatory Visit: Payer: Medicare Other | Admitting: Lab

## 2012-10-08 LAB — PREPARE RBC (CROSSMATCH)

## 2012-10-08 LAB — CBC
HCT: 27.4 % — ABNORMAL LOW (ref 36.0–46.0)
Hemoglobin: 8 g/dL — ABNORMAL LOW (ref 12.0–15.0)
MCH: 21.6 pg — ABNORMAL LOW (ref 26.0–34.0)
MCV: 73.9 fL — ABNORMAL LOW (ref 78.0–100.0)
RBC: 3.71 MIL/uL — ABNORMAL LOW (ref 3.87–5.11)

## 2012-10-08 LAB — BASIC METABOLIC PANEL
BUN: 20 mg/dL (ref 6–23)
CO2: 30 mEq/L (ref 19–32)
Calcium: 8.8 mg/dL (ref 8.4–10.5)
Creatinine, Ser: 0.98 mg/dL (ref 0.50–1.10)
GFR calc Af Amer: 58 mL/min — ABNORMAL LOW (ref >90)
Glucose, Bld: 96 mg/dL (ref 70–99)

## 2012-10-08 LAB — MAGNESIUM: Magnesium: 1.9 mg/dL (ref 1.5–2.5)

## 2012-10-08 MED ORDER — POTASSIUM CHLORIDE 10 MEQ/100ML IV SOLN
10.0000 meq | INTRAVENOUS | Status: DC
  Administered 2012-10-08: 10 meq via INTRAVENOUS
  Filled 2012-10-08 (×4): qty 100

## 2012-10-08 MED ORDER — WARFARIN SODIUM 3 MG PO TABS
3.0000 mg | ORAL_TABLET | Freq: Once | ORAL | Status: AC
  Administered 2012-10-08: 3 mg via ORAL
  Filled 2012-10-08: qty 1

## 2012-10-08 MED ORDER — FUROSEMIDE 40 MG PO TABS
40.0000 mg | ORAL_TABLET | Freq: Every day | ORAL | Status: DC
  Administered 2012-10-08 – 2012-10-11 (×4): 40 mg via ORAL
  Filled 2012-10-08 (×4): qty 1

## 2012-10-08 MED ORDER — POTASSIUM CHLORIDE CRYS ER 20 MEQ PO TBCR
40.0000 meq | EXTENDED_RELEASE_TABLET | Freq: Two times a day (BID) | ORAL | Status: DC
  Administered 2012-10-08 – 2012-10-11 (×7): 40 meq via ORAL
  Filled 2012-10-08 (×8): qty 2

## 2012-10-08 NOTE — Progress Notes (Signed)
[facility]  Subjective: She thinks she is breathing better.  She stated she slept flat in the bed.  Objective: Vital signs in last 24 hours: Temp:  [97.7 F (36.5 C)-98.3 F (36.8 C)] 97.8 F (36.6 C) (04/07 0600) Pulse Rate:  [81-101] 92 (04/07 0600) Resp:  [18-20] 20 (04/07 0600) BP: (94-116)/(48-68) 110/57 mmHg (04/07 0600) SpO2:  [93 %-98 %] 93 % (04/07 0600) Weight:  [69.2 kg (152 lb 8.9 oz)] 69.2 kg (152 lb 8.9 oz) (04/07 0607) Last BM Date: 10/06/12  Intake/Output from previous day: 04/06 0701 - 04/07 0700 In: 560 [P.O.:560] Out: 2150 [Urine:2150] Intake/Output this shift: Total I/O In: -  Out: 300 [Urine:300]  Medications Current Facility-Administered Medications  Medication Dose Route Frequency Provider Last Rate Last Dose  . 0.9 %  sodium chloride infusion  250 mL Intravenous PRN Catarina Hartshorn, MD      . acetaminophen (TYLENOL) tablet 650 mg  650 mg Oral Q4H PRN Catarina Hartshorn, MD      . albuterol (PROVENTIL) (5 MG/ML) 0.5% nebulizer solution 2.5 mg  2.5 mg Nebulization QID Catarina Hartshorn, MD   2.5 mg at 10/07/12 2019  . albuterol (PROVENTIL) (5 MG/ML) 0.5% nebulizer solution 2.5 mg  2.5 mg Nebulization Q2H PRN Elease Etienne, MD      . azelastine (ASTELIN) nasal spray 2 spray  2 spray Each Nare BID Catarina Hartshorn, MD   2 spray at 10/07/12 2307  . budesonide (PULMICORT) nebulizer solution 0.5 mg  0.5 mg Nebulization BID Catarina Hartshorn, MD   0.5 mg at 10/07/12 2019  . cholecalciferol (VITAMIN D) tablet 2,000 Units  2,000 Units Oral Daily Catarina Hartshorn, MD   2,000 Units at 10/07/12 [phone]  . cyclobenzaprine (FLEXERIL) tablet 10 mg  10 mg Oral TID Catarina Hartshorn, MD   10 mg at 10/07/12 2307  . diltiazem (DILACOR XR) 24 hr capsule 180 mg  180 mg Oral Daily Catarina Hartshorn, MD   180 mg at 10/07/12 0955  . ferrous sulfate tablet 650 mg  650 mg Oral Daily Catarina Hartshorn, MD   650 mg at 10/07/12 0955  . furosemide (LASIX) injection 40 mg  40 mg Intravenous Q12H Catarina Hartshorn, MD   40 mg at  10/08/12 0558  . loratadine (CLARITIN) tablet 10 mg  10 mg Oral QPM Catarina Hartshorn, MD   10 mg at 10/07/12 1719  . multivitamin with minerals tablet 1 tablet  1 tablet Oral Daily Catarina Hartshorn, MD   1 tablet at 10/07/12 0955  . ondansetron (ZOFRAN) injection 4 mg  4 mg Intravenous Q6H PRN Catarina Hartshorn, MD      . oxyCODONE-acetaminophen (PERCOCET/ROXICET) 5-325 MG per tablet 1 tablet  1 tablet Oral Q4H PRN Catarina Hartshorn, MD      . pantoprazole (PROTONIX) EC tablet 40 mg  40 mg Oral Daily Catarina Hartshorn, MD   40 mg at 10/07/12 1008  . potassium chloride 10 mEq in 100 mL IVPB  10 mEq Intravenous Q1 Hr x 4 Rolan Lipa, NP   10 mEq at 10/08/12 2956  . predniSONE (DELTASONE) tablet 10 mg  10 mg Oral Larita Fife Tat, MD      . sodium chloride 0.9 % injection 3 mL  3 mL Intravenous Q12H Catarina Hartshorn, MD   3 mL at 10/07/12 2307  . sodium chloride 0.9 % injection 3 mL  3 mL Intravenous PRN Catarina Hartshorn, MD      . Warfarin - Pharmacist Dosing Inpatient  Does not apply q1800 Catarina Hartshorn, MD        PE: General appearance: alert, cooperative and no distress Lungs: Decreased BS. Bilateral wheeze. No rales. Heart: irregularly irregular rhythm Extremities: No LEE Pulses: 2+ and symmetric Neurologic: Grossly normal  Lab Results:   Recent Labs  10/06/12 1356 10/07/12 0733 10/08/12 0506  WBC 7.6 5.3 8.3  HGB 8.0* 8.4* 8.0*  HCT 27.9* 28.2* 27.4*  PLT 396 384 395   BMET  Recent Labs  10/06/12 1356 10/07/12 0055 10/08/12 0506  NA 143 142 141  K 3.9 3.7 2.8*  CL 110 107 104  CO2 26 27 30   GLUCOSE 108* 154* 96  BUN 16 16 20   CREATININE 0.79 0.76 0.98  CALCIUM 9.0 8.9 8.8   PT/INR  Recent Labs  10/06/12 1356 10/07/12 0055 10/08/12 0506  LABPROT 27.9* 29.4* 26.4*  INR 2.77* 2.98* 2.58*    Assessment/Plan  Principal Problem:   Acute on chronic diastolic CHF (congestive heart failure), NYHA class 2 Active Problems:   A-fib, rate controlled on coumadin   CVA history   COPD    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  BNP was only mildy elevated.  Recommend changing to PO lasix for PM dose.  INR therapeutic.  Hgb stable but low.  Hypokalemia being replaced.  Wheezing: Respiratory to administer nebs. this morning.     LOS: 2 days    HAGER, BRYAN 10/08/2012 8:29 AM . I have seen and examined the patient along with Wilburt Finlay, PA.  I have reviewed the chart, notes and new data.  I agree with PA's note.  Key new complaints: better, no orthopnea, no desaturation walking in hallway with PT Key examination changes: weight appears to be inaccurate (?) Key new findings / data: K is very low  PLAN: Slow down diuretics (switch to PO), replace K. Transfusion will probably help overall, but will increase volume load. Would watch for at least 24 h after transfusion and change in diuretic route. Note PT recommends inpatient rehab.   Thurmon Fair, MD, [facility] [phone] 10/08/2012, 4:44 PM

## 2012-10-08 NOTE — Evaluation (Addendum)
Physical Therapy Evaluation Patient Details Name: Angelica [name] MRN: [id number] DOB: 05-Aug-1922 Today's Date: 10/08/2012 Time: 7829-5621 PT Time Calculation (min): 16 min  PT Assessment / Plan / Recommendation Clinical Impression  Pt presents with acute on chronic CHF with history of COPD, HTN, and afib.  Tolerated ambulation in hallway well at min assist and RW on RA with SaO2 at 97%.  Noted in chart pts hx of dementia, however pt mostly WFL during session.  She did state that her husband is able to assist her if needed, but when checked chart, it states pts husband has Alzheimer's Disease and daughter lives down the street.  Pt will benefit from skilled PT in acute venue to address deficits.  PT recommends HHPT vs ST SNF pending progress in hospital.      PT Assessment  Patient needs continued PT services    Follow Up Recommendations  Home health PT;SNF, 24/7 supervision/assist    Does the patient have the potential to tolerate intense rehabilitation      Barriers to Discharge None      Equipment Recommendations  None recommended by PT    Recommendations for Other Services OT consult   Frequency Min 3X/week    Precautions / Restrictions Precautions Precautions: Fall Precaution Comments: did well on RA, however monitor O2 (hx of COPD) Restrictions Weight Bearing Restrictions: No   Pertinent Vitals/Pain Pt denies pain during session.       Mobility  Bed Mobility Bed Mobility: Sit to Supine Sit to Supine: 4: Min guard;HOB flat Details for Bed Mobility Assistance: Guarding assist for trunk into bed.  She requires cues for adjusting hips once in bed.  Transfers Transfers: Sit to Stand;Stand to Sit Sit to Stand: With upper extremity assist;With armrests;From chair/3-in-1;4: Min assist Stand to Sit: 4: Min guard;With upper extremity assist;To bed Details for Transfer Assistance: Assist to rise and steady (more when standing due to noted posterior LOB) with cues for hand  placement and safety.  Ambulation/Gait Ambulation/Gait Assistance: 4: Min assist Ambulation Distance (Feet): 145 Feet Assistive device: Rolling walker Ambulation/Gait Assistance Details: Requires assist to steady throughout with cues for maintaining position inside of RW and steering RW to avoid obstacles.  she tends to slightly veer to left with amb.   Gait Pattern: Step-through pattern;Decreased stride length;Trunk flexed General Gait Details: Ambulated on RA with SaO2 at 97% following 145' of amb.  Stairs: No Wheelchair Mobility Wheelchair Mobility: No    Exercises     PT Diagnosis: Difficulty walking;Generalized weakness  PT Problem List: Decreased strength;Decreased activity tolerance;Decreased balance;Decreased mobility;Decreased coordination;Decreased knowledge of use of DME;Cardiopulmonary status limiting activity PT Treatment Interventions: DME instruction;Gait training;Stair training;Functional mobility training;Therapeutic activities;Therapeutic exercise;Balance training;Patient/family education   PT Goals Acute Rehab PT Goals PT Goal Formulation: With patient Time For Goal Achievement: 10/15/12 Potential to Achieve Goals: Good Pt will go Supine/Side to Sit: with supervision PT Goal: Supine/Side to Sit - Progress: Goal set today Pt will go Sit to Supine/Side: with supervision PT Goal: Sit to Supine/Side - Progress: Goal set today Pt will go Sit to Stand: with supervision PT Goal: Sit to Stand - Progress: Goal set today Pt will Ambulate: 51 - 150 feet;with supervision;with least restrictive assistive device PT Goal: Ambulate - Progress: Goal set today Pt will Go Up / Down Stairs: 3-5 stairs;with supervision;with rail(s) PT Goal: Up/Down Stairs - Progress: Goal set today  Visit Information  Last PT Received On: 10/08/12 Assistance Needed: +1    Subjective Data  Subjective: I don't  much care if I get up or not.  Patient Stated Goal: to return home.    Prior  Functioning  Home Living Lives With: Spouse Available Help at Discharge: Family;Available 24 hours/day Type of Home: House Home Access: Stairs to enter [employer] of Steps: 3 Entrance Stairs-Rails: Right Home Layout: Two level;Able to live on main level with bedroom/bathroom Bathroom Shower/Tub: Engineer, manufacturing systems: Standard Home Adaptive Equipment: Walker - rolling;Grab bars in shower Prior Function Level of Independence: Independent with assistive device(s) Able to Take Stairs?: Yes Driving: No Vocation: Retired Musician: Surveyor, mining Overall Cognitive Status: Appears within functional limits for tasks assessed/performed Arousal/Alertness: Awake/alert Orientation Level: Appears intact for tasks assessed Behavior During Session: [facility] for tasks performed    Extremity/Trunk Assessment Right Lower Extremity Assessment RLE ROM/Strength/Tone: [facility] for tasks assessed Left Lower Extremity Assessment LLE ROM/Strength/Tone: [facility] for tasks assessed Trunk Assessment Trunk Assessment: Kyphotic   Balance    End of Session PT - End of Session Equipment Utilized During Treatment: Gait belt Activity Tolerance: Patient tolerated treatment well Patient left: in bed;with call bell/phone within reach Nurse Communication: Mobility status  GP     Vista Deck 10/08/2012, 12:30 PM

## 2012-10-08 NOTE — Care Management Note (Addendum)
    Page 1 of 2   10/11/2012     2:02:26 PM   CARE MANAGEMENT NOTE 10/11/2012  Patient:  Angelica [name], Angelica [name]   Account Number:  [id number]  Date Initiated:  10/08/2012  Documentation initiated by:  Lanier Clam  Subjective/Objective Assessment:   ADMITTED W/CHF.     Action/Plan:   FROM HOME W/SUPPORT.HAS PCP,PHARMACY.   Anticipated DC Date:  10/11/2012   Anticipated DC Plan:  HOME W HOME HEALTH SERVICES      DC Planning Services  CM consult  [facility]      Choice offered to / List presented to:  C-1 Patient        HH arranged  HH-1 RN  HH-2 PT      [facility] agency  [facility]   Status of service:  Completed, signed off Medicare Important Message given?  NA - LOS <3 / Initial given by admissions (If response is "NO", the following Medicare IM given date fields will be blank) Date Medicare IM given:   Date Additional Medicare IM given:  10/10/2012  Discharge Disposition:  HOME W HOME HEALTH SERVICES  Per UR Regulation:  Reviewed for med. necessity/level of care/duration of stay  If discussed at Long Length of Stay Meetings, dates discussed:    Comments:  10/11/12 Treanna Dumler RN,BSN NCM [phone] [facility] KRISTEN (REP) AWARE D/C, & HH ORDERS FROM YESTERDAY. 10/10/12 Kalanie Fewell RN,BSN NCM [phone] NOTED PER NSG N/V,ABD XRAY.TC [facility] REMINDED TO FOLLOW JUST IN CASE NOT D/C THIS EVENING. SPOKE TO DAUGHTER VIA PHONE PER PATIENT REQUEST LINDA TEL#[phone] INFORMED OF HH SERVICES,SHE ONLY WANTED HHRN/PT SAYS THERE IS NO NEED FOR HHOT.PATIENT ALREADY HAS RW.SHE FEELS OVERWHELMED WITH TRYING TO TAKE CARE OF HER FATHER WHO IS ALSO IN THE HOME.PROVIDED HER WITH [facility] [employer],& INFO,PRIVATE DUTY CARE AGENCY LIST.DAUGHTER SAYS SHE CANNOT PAY THE FEES,IS THERE A PROGRAM THAT THE HOSPITAL IS CONNECTED WITH.INFORMED HER OF RESPITE CARE AS AN OPTION.SHE SAYS SHE HAS ALREADY CALLED A FEW RESPITE CARE SERVICES & THEY HAVE A WAITING LIST.TC TO SW,LEFT MESSAGE TO CONTACT DTR W/CALL BACK#.LEFT  COPY OF MEDICARE IM NOTICE IN RM.MD UPDATED. 10/09/12 Moussa Wiegand RN,BSN NCM [phone] PT-HH.PATIENT CHOSE [facility] FOR HH.   10/07/12 Jakalyn Kratky RN,BSN NCM [phone] PROVIDED W/[facility] AGENCY LIST.RECOMMEND HHRN-CHF PROTOCAL.

## 2012-10-08 NOTE — Progress Notes (Signed)
[facility] PROGRESS NOTE  Angelica [name] AVW:[id number] DOB: January 21, 1923 DOA: 10/06/2012 PCP: Thayer Headings, MD  Brief narrative 79 year old female with a history of hypertension, COPD, chronic atrial fibrillation, gastric AVMs, intermittent dark stools and stroke presented with two-day history of worsening shortness of breath. The patient visited the emergency department on 09/27/2012 because of left flank pain. The patient was sent home after a normal chest x-ray with Flexeril. It initially helped, but the pain continued to worsen. As a result, the patient quit taking her furosemide at home because it was increasingly difficult to get into the bathroom due to pain. She has noted increasing dyspnea on exertion as well as at rest. No history of recent trauma or falls. Additional evaluation in the emergency department revealed proBNP 794, chest x-ray showing pulmonary edema with small bilateral pleural effusions(worsen since 09/27/12), EKG with chronic right bundle branch block, hemoglobin 8.0. The patient had a hemoglobin of 10.5 on 07/25/2012. Her baseline hemoglobin is 10-11. In emergency department, the patient received aerosolized albuterol and Atrovent as well as IV Solu-Medrol 125 mg and furosemide 40 mg IV x1  Assessment/Plan: 1. Acute on chronic diastolic CHF: Secondary to recent noncompliance with diuretics. Chronic anemia may be contributing some. Clinically improving. 2-D echo: LVEF 55-60%. Cardiology input appreciated. 2. Iron deficiency anemia: Hemoglobin stable in the low 8 g per DL range since mid-March 8295. No indication for transfusion at this time. Check FOBT. Continue iron supplements. S/P IV Iron on 4/5-consider repeating ferritin levels in a month's time. Per daughter, patient does not agree to endoscopies. Discussed with primary hematologist-recommends transfusing 1 unit of PRBC and has close followups at his office. 3. COPD: Continue home chronic prednisone 10 mg every other  day. Bronchodilator nebulizations. Probably made worse by decompensated CHF. Improving. 4. Permanent atrial fibrillation: Rate controlled. Anticoagulated. Continue Cardizem. Cardiology input appreciated.  5. Left flank pain: CT chest and abdomen without acute findings. Per patient, no pain today.? Radicular pain from degenerative thoracic spine disease. 6. HTN: Controlled. 7. History of gastric AVMs and? Intermittent melena: Check FOBT. Hemoglobin stable. PPIs. Patient declines endoscopies. 8. History of CVA: Anticoagulated on Coumadin. 9. Hypokalemia: Orally replete and follow BMP  Code Status: Full Family Communication: Discussed with Ms. [name], daughter. Disposition Plan: Home when medically stable.   Consultants:  Cardiology-[facility]  Procedures:  None  Antibiotics:  None   HPI/Subjective: Denies left flank pain. Dyspnea is better. No BM.  Objective: Filed Vitals:   10/08/12 0607 10/08/12 0852 10/08/12 1255 10/08/12 1357  BP:   111/67   Pulse:   101   Temp:   97.3 F (36.3 C)   TempSrc:   Oral   Resp:   18   Height:      Weight: 69.2 kg (152 lb 8.9 oz)     SpO2:  95% 96% 96%    Intake/Output Summary (Last 24 hours) at 10/08/12 1501 Last data filed at 10/08/12 1100  Gross per 24 hour  Intake    600 ml  Output   2150 ml  Net  -1550 ml   Filed Weights   10/06/12 1716 10/07/12 0530 10/08/12 0607  Weight: 67.2 kg (148 lb 2.4 oz) 67.5 kg (148 lb 13 oz) 69.2 kg (152 lb 8.9 oz)    Exam:   General exam: Comfortable. Sitting on chair.  Respiratory system:  improved breath sounds. No wheezing or rhonchi.occasional basal crackles.  No increased work of breathing.  Cardiovascular system: S1 & S2 heard, irregular. No JVD, murmurs,  gallops, clicks. 1+ bilateral leg edema. Telemetry: A. fib with CVR  Gastrointestinal system: Abdomen is nondistended, soft and nontender. Normal bowel sounds heard.  Central nervous system: Alert and oriented only to self. No focal  neurological deficits.? Hard of hearing  Extremities: Symmetric 5 x 5 power.   Data Reviewed: Basic Metabolic Panel:  Recent Labs Lab 10/06/12 1356 10/07/12 0055 10/08/12 0506  NA 143 142 141  K 3.9 3.7 2.8*  CL 110 107 104  CO2 26 27 30   GLUCOSE 108* 154* 96  BUN 16 16 20   CREATININE 0.79 0.76 0.98  CALCIUM 9.0 8.9 8.8  MG  --   --  1.9   Liver Function Tests:  Recent Labs Lab 10/06/12 1356  AST 13  ALT 12  ALKPHOS 87  BILITOT 0.2*  PROT 5.8*  ALBUMIN 3.1*   No results found for this basename: LIPASE, AMYLASE,  in the last 168 hours No results found for this basename: AMMONIA,  in the last 168 hours CBC:  Recent Labs Lab 10/06/12 1356 10/07/12 0733 10/08/12 0506  WBC 7.6 5.3 8.3  NEUTROABS 6.6  --   --   HGB 8.0* 8.4* 8.0*  HCT 27.9* 28.2* 27.4*  MCV 75.2* 73.6* 73.9*  PLT 396 384 395   Cardiac Enzymes:  Recent Labs Lab 10/06/12 1356 10/06/12 1915 10/07/12 0055  TROPONINI <0.30 <0.30 <0.30   BNP (last 3 results)  Recent Labs  01/21/12 0406 03/05/12 1320 10/06/12 1356  PROBNP 483.1* 1140.0* 794.7*   CBG: No results found for this basename: GLUCAP,  in the last 168 hours  No results found for this or any previous visit (from the past 240 hour(s)).   Studies: Ct Abdomen Pelvis Wo Contrast  10/06/2012  *RADIOLOGY REPORT*  Clinical Data: Left flank and back pain.  CT ABDOMEN AND PELVIS WITHOUT CONTRAST  Technique:  Multidetector CT imaging of the abdomen and pelvis was performed following the standard protocol without intravenous contrast.  Comparison: None.  Findings: Visualized lung bases show bibasilar atelectasis and a small right pleural effusion.  No renal obstruction identified.  There is a small nonobstructing calculus in the lower pole collecting system of the right kidney measuring approximately 4 mm.  The bladder is unremarkable.  The gallbladder has been removed.  There is a calcified granuloma in the right lobe of the liver.  Splenic  artery calcifications present without aneurysm.  No acute inflammatory process is identified.  Sigmoid diverticulosis present without evidence of diverticulitis.  There are some calcified mesenteric lymph nodes in the mid mesentery.  No hernias are identified.  Moderate degenerative disease of the lumbar spine.  IMPRESSION: No evidence of obstructing calculi.  A nonobstructing calculus is present in the lower pole of the right kidney.   Original Report Authenticated By: Irish Lack, M.D.    Ct Chest Wo Contrast  10/06/2012  *RADIOLOGY REPORT*  Clinical Data: Dyspnea.  Left chest pain.  CT CHEST WITHOUT CONTRAST  Technique:  Multidetector CT imaging of the chest was performed following the standard protocol without IV contrast.  Comparison: 10/06/2012 chest x-ray.  No comparison chest CT.  Findings: Cardiomegaly.  Pulmonary vascular congestion.  Small bilateral pleural effusions greater on the right.  Findings consistent with congestive heart failure.  Adenopathy may be related to the congestive heart failure.  Evaluation for pulmonary embolus or dissection not possible without contrast administration.  Mild coronary calcifications.  Calcification mitral valve and aortic valve.  Calcification of the thoracic aorta with mild ectasia.  Moderate size hiatal hernia.  Scoliosis. Mildly wedging of the T8 vertebral body with loss of height most notable inferior endplate possibly remote.  This causes narrowing of the left neural foramen.  Question of this may contribute to the patient's discomfort.  No obvious rib fracture although images slightly motion degraded.  IMPRESSION: Congestive heart failure.  Please see above.  Moderate sized hiatal hernia.  Scoliosis and degenerative changes with left T8-9 foraminal narrowing.   Original Report Authenticated By: Lacy Duverney, M.D.      Additional labs:   Scheduled Meds: . albuterol  2.5 mg Nebulization QID  . azelastine  2 spray Each Nare BID  . budesonide  0.5 mg  Nebulization BID  . cholecalciferol  2,000 Units Oral Daily  . cyclobenzaprine  10 mg Oral TID  . diltiazem  180 mg Oral Daily  . ferrous sulfate  650 mg Oral Daily  . furosemide  40 mg Oral Daily  . loratadine  10 mg Oral QPM  . multivitamin with minerals  1 tablet Oral Daily  . pantoprazole  40 mg Oral Daily  . potassium chloride  40 mEq Oral BID  . predniSONE  10 mg Oral QODAY  . sodium chloride  3 mL Intravenous Q12H  . warfarin  3 mg Oral ONCE-1800  . Warfarin - Pharmacist Dosing Inpatient   Does not apply q1800   Continuous Infusions:   Principal Problem:   Acute on chronic diastolic CHF (congestive heart failure), NYHA class 2 Active Problems:   A-fib, rate controlled on coumadin   CVA history   COPD    Dementia-stage 4-5   Anemia due to GI blood loss   Iron deficiency anemia secondary to blood loss (chronic)   Arteriovenous malformation of gastrointestinal tract   Chronic anticoagulation    Time spent: 35 minutes    [facility]  [facility] Pager [phone].   If 8PM-8AM, please contact night-coverage at [email or web address], password [place] 10/08/2012, 3:01 PM  LOS: 2 days

## 2012-10-08 NOTE — Telephone Encounter (Signed)
Patient's daughter called and cx 10/08/12 lab and iron apt due to patient being in the hospital.  She will call back to resch

## 2012-10-08 NOTE — Progress Notes (Signed)
ANTICOAGULATION CONSULT NOTE - Follow-up Consult  Pharmacy Consult for warfarin Indication: atrial fibrillation  Allergies  Allergen Reactions  . Amoxicillin Hives  . Other     Ragweed--wheezing, congestion, etc  . Penicillins Hives    Patient Measurements: Height: 5\' 4"  (162.6 cm) Weight: 152 lb 8.9 oz (69.2 kg) IBW/kg (Calculated) : 54.7  Vital Signs: Temp: 97.8 F (36.6 C) (04/07 0600) Temp src: Oral (04/07 0600) BP: 110/57 mmHg (04/07 0600) Pulse Rate: 92 (04/07 0600)  Labs:  Recent Labs  10/06/12 1356 10/06/12 1915 10/07/12 0055 10/07/12 0733 10/08/12 0506  HGB 8.0*  --   --  8.4* 8.0*  HCT 27.9*  --   --  28.2* 27.4*  PLT 396  --   --  384 395  LABPROT 27.9*  --  29.4*  --  26.4*  INR 2.77*  --  2.98*  --  2.58*  CREATININE 0.79  --  0.76  --  0.98  TROPONINI <0.30 <0.30 <0.30  --   --     Estimated Creatinine Clearance: 38.6 ml/min (by C-G formula based on Cr of 0.98).   Medical History: Past Medical History  Diagnosis Date  . Atrial fibrillation   . Hypertension   . Arthritis   . GERD (gastroesophageal reflux disease)   . COPD (chronic obstructive pulmonary disease)   . PNA (pneumonia)   . Anemia due to GI blood loss 07/11/2011  . 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 Stroke 2009    aphasia and memory impairment as residual  . Hypoxia     Medications:  Scheduled:  . albuterol  2.5 mg Nebulization QID  . azelastine  2 spray Each Nare BID  . budesonide  0.5 mg Nebulization BID  . cholecalciferol  2,000 Units Oral Daily  . cyclobenzaprine  10 mg Oral TID  . diltiazem  180 mg Oral Daily  . ferrous sulfate  650 mg Oral Daily  . furosemide  40 mg Oral Daily  . loratadine  10 mg Oral QPM  . multivitamin with minerals  1 tablet Oral Daily  . pantoprazole  40 mg Oral Daily  . potassium chloride  40 mEq Oral BID  . predniSONE  10 mg Oral QODAY  .  sodium chloride  3 mL Intravenous Q12H  . [COMPLETED] warfarin  1 mg Oral ONCE-1800  . Warfarin - Pharmacist Dosing Inpatient   Does not apply q1800  . [DISCONTINUED] furosemide  40 mg Intravenous Q12H  . [DISCONTINUED] potassium chloride  10 mEq Intravenous Q1 Hr x 4   Infusions:    Assessment: 79 year old female with a history of hypertension, COPD, chronic atrial fibrillation, and stroke presents with two-day history of worsening shortness of breath and left flank pain. Note patient visited ER on 3/27 for left flank pain as well.  Coumadin dosing resumed per pharmacy.   Home dose of warfarin 6mg  daily - last dose 4/5  CBC: Hgb remains in the 8's (stable), platelets WNL, no bleeding reported/documented  SCr WNL  Cardiac diet  Noted specific INR goal per oncology [facility] notes in [facility].  INR supratherapeutic today at 2.58 for narrow/low INR goal of 1.8-2.3.  INR trending down after providing a 1 mg dose last night.  Anticipate INR will continue to trend down considering home dose is 6 mg daily so will increase dose today but keep it lower than home dose until INR drops into documented INR goal  range.  Goal of Therapy:  INR goal: 1.8 - 2.3   Plan:  1) Coumadin 3 mg po x 1 tonight 2) Daily INR  Clance Boll, PharmD, BCPS Pager: [phone] 10/08/2012 12:19 PM

## 2012-10-09 LAB — TYPE AND SCREEN
ABO/RH(D): B POS
Antibody Screen: NEGATIVE
Unit division: 0

## 2012-10-09 LAB — CBC
MCH: 22.2 pg — ABNORMAL LOW (ref 26.0–34.0)
MCHC: 30 g/dL (ref 30.0–36.0)
MCV: 74.1 fL — ABNORMAL LOW (ref 78.0–100.0)
Platelets: 397 10*3/uL (ref 150–400)
RBC: 4.09 MIL/uL (ref 3.87–5.11)
RDW: 15.9 % — ABNORMAL HIGH (ref 11.5–15.5)

## 2012-10-09 LAB — BASIC METABOLIC PANEL
CO2: 33 mEq/L — ABNORMAL HIGH (ref 19–32)
Calcium: 9.4 mg/dL (ref 8.4–10.5)
Creatinine, Ser: 0.87 mg/dL (ref 0.50–1.10)
GFR calc non Af Amer: 58 mL/min — ABNORMAL LOW (ref >90)

## 2012-10-09 LAB — PROTIME-INR: Prothrombin Time: 19.5 seconds — ABNORMAL HIGH (ref 11.6–15.2)

## 2012-10-09 MED ORDER — WARFARIN SODIUM 5 MG PO TABS
5.0000 mg | ORAL_TABLET | Freq: Once | ORAL | Status: AC
  Administered 2012-10-09: 5 mg via ORAL
  Filled 2012-10-09: qty 1

## 2012-10-09 NOTE — Progress Notes (Signed)
[facility] PROGRESS NOTE  Angelica [name] ZOX:[id number] DOB: 05-15-1923 DOA: 10/06/2012 PCP: Thayer Headings, MD  Brief narrative 79 year old female with a history of hypertension, COPD, chronic atrial fibrillation, gastric AVMs, intermittent dark stools and stroke presented with two-day history of worsening shortness of breath. The patient visited the emergency department on 09/27/2012 because of left flank pain. The patient was sent home after a normal chest x-ray with Flexeril. It initially helped, but the pain continued to worsen. As a result, the patient quit taking her furosemide at home because it was increasingly difficult to get into the bathroom due to pain. She has noted increasing dyspnea on exertion as well as at rest. No history of recent trauma or falls. Additional evaluation in the emergency department revealed proBNP 794, chest x-ray showing pulmonary edema with small bilateral pleural effusions(worsen since 09/27/12), EKG with chronic right bundle branch block, hemoglobin 8.0. The patient had a hemoglobin of 10.5 on 07/25/2012. Her baseline hemoglobin is 10-11. In emergency department, the patient received aerosolized albuterol and Atrovent as well as IV Solu-Medrol 125 mg and furosemide 40 mg IV x1  Assessment/Plan: 1. Acute on chronic diastolic CHF: Secondary to recent noncompliance with diuretics and anemia. Clinically improving. 2-D echo: LVEF 55-60%. Cardiology following. 2. Iron deficiency anemia: Status post 1 unit PRBC on 4/7-improved. Continue iron supplements. S/P IV Iron on 4/5-consider repeating ferritin levels in a month's time. Per daughter, patient does not agree to endoscopies. Followup with primary hematologist as OP. 3. COPD: Continue home chronic prednisone 10 mg every other day. Bronchodilator nebulizations. Probably made worse by decompensated CHF. Improved 4. Permanent atrial fibrillation: Rate controlled. Anticoagulated. Continue Cardizem. Cardiology  following.  5. Left flank pain: CT chest and abdomen without acute findings. Resolved.? Radicular pain from degenerative thoracic spine disease. 6. HTN: Controlled. 7. History of gastric AVMs and? Intermittent melena: Check FOBT. PPIs. Patient declines endoscopies. 8. History of CVA: Anticoagulated on Coumadin. 9. Hypokalemia: Repleted  Code Status: Full Family Communication: No family at bedside Disposition Plan: Home when medically stable.   Consultants:  Cardiology-[facility]  Procedures:  None  Antibiotics:  None   HPI/Subjective: Dyspnea continues to improve. No flank pain.  Objective: Filed Vitals:   10/08/12 2030 10/09/12 0545 10/09/12 0810 10/09/12 1330  BP: 119/63 115/65  116/70  Pulse: 97 76  79  Temp: 98.2 F (36.8 C) 98 F (36.7 C)  98.5 F (36.9 C)  TempSrc: Oral Oral  Oral  Resp: 18 18  17   Height:      Weight:  68 kg (149 lb 14.6 oz)    SpO2:  94% 96% 98%    Intake/Output Summary (Last 24 hours) at 10/09/12 1824 Last data filed at 10/09/12 1700  Gross per 24 hour  Intake 802.92 ml  Output   1475 ml  Net -672.08 ml   Filed Weights   10/07/12 0530 10/08/12 0607 10/09/12 0545  Weight: 67.5 kg (148 lb 13 oz) 69.2 kg (152 lb 8.9 oz) 68 kg (149 lb 14.6 oz)    Exam:   General exam: Comfortable. Sitting on chair.  Respiratory system:  improved breath sounds. No wheezing or rhonchi. Occasional basal crackles-decreasing.  No increased work of breathing.  Cardiovascular system: S1 & S2 heard, irregular. No JVD, murmurs, gallops, clicks. 1+ bilateral leg edema.   Gastrointestinal system: Abdomen is nondistended, soft and nontender. Normal bowel sounds heard.  Central nervous system: Alert and oriented only to self. No focal neurological deficits.? Hard of hearing  Extremities: Symmetric 5 x  5 power.   Data Reviewed: Basic Metabolic Panel:  Recent Labs Lab 10/06/12 1356 10/07/12 0055 10/08/12 0506 10/09/12 0530  NA 143 142 141 141  K 3.9  3.7 2.8* 3.7  CL 110 107 104 105  CO2 26 27 30  33*  GLUCOSE 108* 154* 96 93  BUN 16 16 20 18   CREATININE 0.79 0.76 0.98 0.87  CALCIUM 9.0 8.9 8.8 9.4  MG  --   --  1.9  --    Liver Function Tests:  Recent Labs Lab 10/06/12 1356  AST 13  ALT 12  ALKPHOS 87  BILITOT 0.2*  PROT 5.8*  ALBUMIN 3.1*   No results found for this basename: LIPASE, AMYLASE,  in the last 168 hours No results found for this basename: AMMONIA,  in the last 168 hours CBC:  Recent Labs Lab 10/06/12 1356 10/07/12 0733 10/08/12 0506 10/09/12 0530  WBC 7.6 5.3 8.3 11.2*  NEUTROABS 6.6  --   --   --   HGB 8.0* 8.4* 8.0* 9.1*  HCT 27.9* 28.2* 27.4* 30.3*  MCV 75.2* 73.6* 73.9* 74.1*  PLT 396 384 395 397   Cardiac Enzymes:  Recent Labs Lab 10/06/12 1356 10/06/12 1915 10/07/12 0055  TROPONINI <0.30 <0.30 <0.30   BNP (last 3 results)  Recent Labs  01/21/12 0406 03/05/12 1320 10/06/12 1356  PROBNP 483.1* 1140.0* 794.7*   CBG: No results found for this basename: GLUCAP,  in the last 168 hours  No results found for this or any previous visit (from the past 240 hour(s)).   Studies: No results found.   Additional labs:   Scheduled Meds: . albuterol  2.5 mg Nebulization QID  . azelastine  2 spray Each Nare BID  . budesonide  0.5 mg Nebulization BID  . cholecalciferol  2,000 Units Oral Daily  . cyclobenzaprine  10 mg Oral TID  . diltiazem  180 mg Oral Daily  . ferrous sulfate  650 mg Oral Daily  . furosemide  40 mg Oral Daily  . loratadine  10 mg Oral QPM  . multivitamin with minerals  1 tablet Oral Daily  . pantoprazole  40 mg Oral Daily  . potassium chloride  40 mEq Oral BID  . predniSONE  10 mg Oral QODAY  . sodium chloride  3 mL Intravenous Q12H  . 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   If 8PM-8AM, please contact night-coverage at [email or web address], password [facility] 10/09/2012, 6:24 PM  LOS: 3 days

## 2012-10-09 NOTE — Progress Notes (Signed)
ANTICOAGULATION CONSULT NOTE - Follow-up Consult  Pharmacy Consult for warfarin Indication: atrial fibrillation  Allergies  Allergen Reactions  . Amoxicillin Hives  . Other     Ragweed--wheezing, congestion, etc  . Penicillins Hives    Patient Measurements: Height: 5\' 4"  (162.6 cm) Weight: 149 lb 14.6 oz (68 kg) IBW/kg (Calculated) : 54.7  Vital Signs: Temp: 98 F (36.7 C) (04/08 0545) Temp src: Oral (04/08 0545) BP: 115/65 mmHg (04/08 0545) Pulse Rate: 76 (04/08 0545)  Labs:  Recent Labs  10/06/12 1356 10/06/12 1915 10/07/12 0055 10/07/12 0733 10/08/12 0506 10/09/12 0530  HGB 8.0*  --   --  8.4* 8.0* 9.1*  HCT 27.9*  --   --  28.2* 27.4* 30.3*  PLT 396  --   --  384 395 397  LABPROT 27.9*  --  29.4*  --  26.4* 19.5*  INR 2.77*  --  2.98*  --  2.58* 1.71*  CREATININE 0.79  --  0.76  --  0.98 0.87  TROPONINI <0.30 <0.30 <0.30  --   --   --     Estimated Creatinine Clearance: 43.2 ml/min (by C-G formula based on Cr of 0.87).   Medical History: Past Medical History  Diagnosis Date  . Atrial fibrillation   . Hypertension   . Arthritis   . GERD (gastroesophageal reflux disease)   . COPD (chronic obstructive pulmonary disease)   . PNA (pneumonia)   . Anemia due to GI blood loss 07/11/2011  . 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 Stroke 2009    aphasia and memory impairment as residual  . Hypoxia     Medications:  Scheduled:  . albuterol  2.5 mg Nebulization QID  . azelastine  2 spray Each Nare BID  . budesonide  0.5 mg Nebulization BID  . cholecalciferol  2,000 Units Oral Daily  . cyclobenzaprine  10 mg Oral TID  . diltiazem  180 mg Oral Daily  . ferrous sulfate  650 mg Oral Daily  . furosemide  40 mg Oral Daily  . loratadine  10 mg Oral QPM  . multivitamin with minerals  1 tablet Oral Daily  . pantoprazole  40 mg Oral Daily  . potassium chloride  40 mEq  Oral BID  . predniSONE  10 mg Oral QODAY  . sodium chloride  3 mL Intravenous Q12H  . [COMPLETED] warfarin  3 mg Oral ONCE-1800  . Warfarin - Pharmacist Dosing Inpatient   Does not apply q1800   Infusions:    Assessment: 79 year old female with a history of hypertension, COPD, chronic atrial fibrillation, and stroke presents with two-day history of worsening shortness of breath and left flank pain. Note patient visited ER on 3/27 for left flank pain as well.  Coumadin dosing resumed per pharmacy.   Home dose of warfarin 6mg  daily - last dose 4/5  CBC: Hgb 9.1 following 1 unit of PRBC yesterday (Hgb has been stable in the 8's, received Nulecit 4/5), platelets WNL, no bleeding reported/documented  SCr WNL  Cardiac diet  Noted specific INR goal per oncology [facility] notes in [facility].  INR decreased just below the narrow/low INR goal of 1.8-2.3 after providing reduced doses (lower than home dosing) d/t INR levels above 2.3.  INR should continue to trend down after these lower doses so will provide higher dose today.  Goal of Therapy:  INR goal: 1.8 - 2.3   Plan:  1) Coumadin  5 mg po x 1 tonight 2) Daily INR  Clance Boll, PharmD, BCPS Pager: [phone] 10/09/2012 1:09 PM

## 2012-10-09 NOTE — Progress Notes (Signed)
Stopped to see patient today. Offered encouragement and offered prayer for continued healing.

## 2012-10-09 NOTE — Progress Notes (Signed)
Subjective:  SOB improved  Objective:  Vital Signs in the last 24 hours: Temp:  [97.3 F (36.3 C)-98.6 F (37 C)] 98 F (36.7 C) (04/08 0545) Pulse Rate:  [55-106] 76 (04/08 0545) Resp:  [16-20] 18 (04/08 0545) BP: (111-129)/(54-67) 115/65 mmHg (04/08 0545) SpO2:  [93 %-96 %] 96 % (04/08 0810) Weight:  [68 kg (149 lb 14.6 oz)] 68 kg (149 lb 14.6 oz) (04/08 0545)  Intake/Output from previous day:  Intake/Output Summary (Last 24 hours) at 10/09/12 1202 Last data filed at 10/09/12 0900  Gross per 24 hour  Intake 825.42 ml  Output   1275 ml  Net -449.58 ml    Physical Exam: General appearance: alert, cooperative, no distress and frail Lungs: few rhonchi Heart: irregularly irregular rhythm   Rate: 80  Rhythm: atrial fibrillation  Lab Results:  Recent Labs  10/08/12 0506 10/09/12 0530  WBC 8.3 11.2*  HGB 8.0* 9.1*  PLT 395 397    Recent Labs  10/08/12 0506 10/09/12 0530  NA 141 141  K 2.8* 3.7  CL 104 105  CO2 30 33*  GLUCOSE 96 93  BUN 20 18  CREATININE 0.98 0.87    Recent Labs  10/06/12 1915 10/07/12 0055  TROPONINI <0.30 <0.30   Hepatic Function Panel  Recent Labs  10/06/12 1356  PROT 5.8*  ALBUMIN 3.1*  AST 13  ALT 12  ALKPHOS 87  BILITOT 0.2*   No results found for this basename: CHOL,  in the last 72 hours  Recent Labs  10/09/12 0530  INR 1.71*    Imaging: Imaging results have been reviewed  Cardiac Studies:  Assessment/Plan:   Principal Problem:   Acute on chronic diastolic CHF (congestive heart failure), NYHA class 2 Active Problems:   A-fib, rate controlled on coumadin   COPD    Chronic anticoagulation   CVA history   Dementia-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 She is very frail. Early follow up with Dr Rennis Golden ro  be arranged..- (TCM pt, high risk for early re hospitalization)   Corine Shelter PA-C 10/09/2012, 12:02 PM   I have seen and examined the patient along with Corine Shelter PA-C.  I have reviewed the chart, notes and new data.  I agree with PA's note.  Key new complaints: dyspnea considerably better Key examination changes: JVP lower (5-6 cm or so), no edema; HR 90s Key new findings / data: Hgb 9.1  Her daughter was here today. She had decided to stop the patient's loop diuretic because she was weak and was spending all day in bed, hard to reach the toilet. Also thought it was unnecessary because she had no ankle edema.. Interestingly, she noticed it was harder to pull her trousers on, probably due to sacral edema.  PLAN: Discussed the important role of weight monitoring as a guide to fluid accumulation, rather than edema which is unreliable due to its dependence on position. Reviewed weight monitoring, signs and symptoms of CHF, Na restriction.  Looking back, I suspect she was weak due to worsening anemia and then went into acute CHF due to a combination of anemia and cessation of diuretic.  Thurmon Fair, MD, [facility] [phone] 10/09/2012, 4:22 PM

## 2012-10-09 NOTE — Progress Notes (Signed)
Physical Therapy Treatment Patient Details Name: Angelica [name] MRN: [id number] DOB: 09-05-1922 Today's Date: 10/09/2012 Time: 4696-2952 PT Time Calculation (min): 12 min  PT Assessment / Plan / Recommendation Comments on Treatment Session  Pt ambulated in hallway and denies SOB.  SaO2 88% upon return to room.    Follow Up Recommendations  Home health PT;Supervision for mobility/OOB     Does the patient have the potential to tolerate intense rehabilitation     Barriers to Discharge        Equipment Recommendations  None recommended by PT    Recommendations for Other Services    Frequency     Plan Discharge plan remains appropriate;Frequency remains appropriate    Precautions / Restrictions Precautions Precautions: Fall Precaution Comments: monitor sats   Pertinent Vitals/Pain SaO2 88% upon return to room on room air    Mobility  Bed Mobility Bed Mobility: Not assessed Transfers Transfers: Sit to Stand;Stand to Sit Sit to Stand: With upper extremity assist;From chair/3-in-1;4: Min guard Stand to Sit: With upper extremity assist;To chair/3-in-1;4: Min assist Details for Transfer Assistance: pt unsteady upon rise and assisted back to sitting with controlled descent, min/guard upon 2nd attempt Ambulation/Gait Ambulation/Gait Assistance: 4: Min guard Ambulation Distance (Feet): 360 Feet Assistive device: Rolling walker Ambulation/Gait Assistance Details: verbal cues for safe use of RW including RW distance and staying within RW Gait Pattern: Step-through pattern;Decreased stride length;Trunk flexed General Gait Details: SaO2 88% upon returning to room    Exercises     PT Diagnosis:    PT Problem List:   PT Treatment Interventions:     PT Goals Acute Rehab PT Goals PT Goal: Sit to Stand - Progress: Progressing toward goal PT Goal: Ambulate - Progress: Progressing toward goal  Visit Information  Last PT Received On: 10/09/12 Assistance Needed: +1    Subjective  Data  Subjective: I guess I could (ambulate)   Cognition  Cognition Overall Cognitive Status: History of cognitive impairments - at baseline Arousal/Alertness: Awake/alert Behavior During Session: [name] for tasks performed Cognition - Other Comments: hx dementia    Balance     End of Session PT - End of Session Activity Tolerance: Patient tolerated treatment well Patient left: in chair;with call bell/phone within reach;with chair alarm set   GP     [name],[name] 10/09/2012, 10:39 AM [name], PT, DPT 10/09/2012 Pager: [phone]

## 2012-10-10 ENCOUNTER — Encounter (HOSPITAL_COMMUNITY): Payer: Self-pay | Admitting: Cardiology

## 2012-10-10 ENCOUNTER — Inpatient Hospital Stay (HOSPITAL_COMMUNITY): Payer: Medicare Other

## 2012-10-10 DIAGNOSIS — I369 Nonrheumatic tricuspid valve disorder, unspecified: Secondary | ICD-10-CM

## 2012-10-10 DIAGNOSIS — D649 Anemia, unspecified: Secondary | ICD-10-CM

## 2012-10-10 DIAGNOSIS — D500 Iron deficiency anemia secondary to blood loss (chronic): Secondary | ICD-10-CM

## 2012-10-10 DIAGNOSIS — I5033 Acute on chronic diastolic (congestive) heart failure: Secondary | ICD-10-CM

## 2012-10-10 DIAGNOSIS — I509 Heart failure, unspecified: Secondary | ICD-10-CM

## 2012-10-10 DIAGNOSIS — Q2733 Arteriovenous malformation of digestive system vessel: Secondary | ICD-10-CM

## 2012-10-10 DIAGNOSIS — I4891 Unspecified atrial fibrillation: Secondary | ICD-10-CM

## 2012-10-10 DIAGNOSIS — J449 Chronic obstructive pulmonary disease, unspecified: Secondary | ICD-10-CM

## 2012-10-10 LAB — GLUCOSE, CAPILLARY: Glucose-Capillary: 185 mg/dL — ABNORMAL HIGH (ref 70–99)

## 2012-10-10 LAB — CBC
Hemoglobin: 9 g/dL — ABNORMAL LOW (ref 12.0–15.0)
MCH: 22.1 pg — ABNORMAL LOW (ref 26.0–34.0)
MCHC: 29.2 g/dL — ABNORMAL LOW (ref 30.0–36.0)
RDW: 16.7 % — ABNORMAL HIGH (ref 11.5–15.5)

## 2012-10-10 LAB — PROTIME-INR: Prothrombin Time: 17.1 seconds — ABNORMAL HIGH (ref 11.6–15.2)

## 2012-10-10 MED ORDER — BISACODYL 10 MG RE SUPP
10.0000 mg | Freq: Every day | RECTAL | Status: DC
  Administered 2012-10-10: 10 mg via RECTAL
  Filled 2012-10-10: qty 1

## 2012-10-10 MED ORDER — DOCUSATE SODIUM 100 MG PO CAPS
200.0000 mg | ORAL_CAPSULE | Freq: Two times a day (BID) | ORAL | Status: DC
  Administered 2012-10-10 – 2012-10-11 (×3): 200 mg via ORAL
  Filled 2012-10-10 (×3): qty 2

## 2012-10-10 MED ORDER — MAGNESIUM HYDROXIDE 400 MG/5ML PO SUSP: 30.0000 mL | Freq: Every day | ORAL | Status: DC

## 2012-10-10 MED ORDER — BISACODYL 10 MG RE SUPP
10.0000 mg | Freq: Two times a day (BID) | RECTAL | Status: DC
  Administered 2012-10-10: 10 mg via RECTAL
  Filled 2012-10-10 (×2): qty 1

## 2012-10-10 MED ORDER — MAGNESIUM HYDROXIDE 400 MG/5ML PO SUSP
30.0000 mL | Freq: Two times a day (BID) | ORAL | Status: DC
  Administered 2012-10-10 – 2012-10-11 (×2): 30 mL via ORAL
  Filled 2012-10-10 (×2): qty 30

## 2012-10-10 MED ORDER — FUROSEMIDE 40 MG PO TABS: 40.0000 mg | ORAL_TABLET | Freq: Every day | ORAL | Status: DC | PRN

## 2012-10-10 MED ORDER — MAGNESIUM HYDROXIDE 400 MG/5ML PO SUSP
30.0000 mL | Freq: Two times a day (BID) | ORAL | Status: DC
  Administered 2012-10-10: 30 mL via ORAL
  Filled 2012-10-10: qty 30

## 2012-10-10 MED ORDER — WARFARIN SODIUM 7.5 MG PO TABS
7.5000 mg | ORAL_TABLET | Freq: Every day | ORAL | Status: AC
  Administered 2012-10-10: 7.5 mg via ORAL
  Filled 2012-10-10: qty 1

## 2012-10-10 NOTE — Progress Notes (Signed)
Pt discharged, has emesis, no BMs x 5 days, B regimen initiated, exam and KUB stable, had 1 BM, requests to leave in the AM, will hold tonight.

## 2012-10-10 NOTE — Progress Notes (Signed)
ANTICOAGULATION CONSULT NOTE - Follow-up Consult  Pharmacy Consult for warfarin Indication: atrial fibrillation  Allergies  Allergen Reactions  . Amoxicillin Hives  . Other     Ragweed--wheezing, congestion, etc  . Penicillins Hives    Patient Measurements: Height: 5\' 4"  (162.6 cm) Weight: 150 lb 5.7 oz (68.2 kg) IBW/kg (Calculated) : 54.7  Vital Signs: Temp: 97.5 F (36.4 C) (04/09 0506) Temp src: Oral (04/09 0506) BP: 101/55 mmHg (04/09 0506) Pulse Rate: 66 (04/09 0506)  Labs:  Recent Labs  10/08/12 0506 10/09/12 0530 10/10/12 0510  HGB 8.0* 9.1* 9.0*  HCT 27.4* 30.3* 30.8*  PLT 395 397 406*  LABPROT 26.4* 19.5* 17.1*  INR 2.58* 1.71* 1.43  CREATININE 0.98 0.87  --     Estimated Creatinine Clearance: 43.2 ml/min (by C-G formula based on Cr of 0.87).   Medical History: Past Medical History  Diagnosis Date  . Atrial fibrillation   . Hypertension   . Arthritis   . GERD (gastroesophageal reflux disease)   . COPD (chronic obstructive pulmonary disease)   . PNA (pneumonia)   . Anemia due to GI blood loss 07/11/2011  . 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 Stroke 2009    aphasia and memory impairment as residual  . Hypoxia     Medications:  Scheduled:  . albuterol  2.5 mg Nebulization QID  . azelastine  2 spray Each Nare BID  . budesonide  0.5 mg Nebulization BID  . cholecalciferol  2,000 Units Oral Daily  . cyclobenzaprine  10 mg Oral TID  . diltiazem  180 mg Oral Daily  . ferrous sulfate  650 mg Oral Daily  . furosemide  40 mg Oral Daily  . loratadine  10 mg Oral QPM  . multivitamin with minerals  1 tablet Oral Daily  . pantoprazole  40 mg Oral Daily  . potassium chloride  40 mEq Oral BID  . predniSONE  10 mg Oral QODAY  . sodium chloride  3 mL Intravenous Q12H  . [COMPLETED] warfarin  5 mg Oral ONCE-1800  . Warfarin - Pharmacist Dosing Inpatient    Does not apply q1800   Infusions:    Assessment: 79 year old female with a history of hypertension, COPD, chronic atrial fibrillation, and stroke presents with two-day history of worsening shortness of breath and left flank pain. Note patient visited ER on 3/27 for left flank pain as well.  Coumadin dosing resumed per pharmacy.   Home dose of warfarin 6mg  daily.   CBC: Hgb stable overnight. Pt recieved 1 unit of PRBC 4/7. (Hgb has been stable in the 8's, received Nulecit 4/5), platelets WNL, no bleeding reported/documented  SCr WNL  Cardiac diet  Noted specific INR goal per oncology [facility] notes in [facility].   Initially INR supratherapeutic therefore lower doses were given.  Today, INR decreased again, has been subtherapeutic x 2 days.  Will give boosted dose today at noon (early), then try to resume home dose of 6mg  daily.     Goal of Therapy:  INR goal: 1.8 - 2.3   Plan:  1) Coumadin 7.5 mg po x 1 at noon 2) Daily INR   Haynes Hoehn, PharmD 10/10/2012 7:52 AM  Pager: [phone]

## 2012-10-10 NOTE — Discharge Summary (Addendum)
[facility]                                                                                   Angelica [name], is a 79 y.o. female  DOB 1922-08-27  MRN [id number].  Admission date:  10/06/2012  Discharge Date:  10/11/2012  Primary MD  Thayer Headings, MD  Admitting Physician  Catarina Hartshorn, MD  Admission Diagnosis  Iron deficiency anemia secondary to blood loss (chronic) [280.0] Back pain [724.5] CHF (congestive heart failure) [428.0] A-fib [427.31] Anemia [285.9] Arteriovenous malformation of gastrointestinal tract [747.61] Acute on chronic diastolic CHF (congestive heart failure), NYHA class 2 [428.33, 428.0]  Discharge Diagnosis     Principal Problem:   Acute on chronic diastolic CHF (congestive heart failure), NYHA class 2 Active Problems:   Atrial fibrillation, permanent, rate controlled on coumadin   CVA history   COPD    Dementia-stage 4-5   Anemia due to GI blood loss   Iron deficiency anemia secondary to blood loss (chronic)   Arteriovenous malformation of gastrointestinal tract   Chronic anticoagulation    Past Medical History  Diagnosis Date  . Atrial fibrillation   . Hypertension   . Arthritis   . GERD (gastroesophageal reflux disease)   . COPD (chronic obstructive pulmonary disease)   . PNA (pneumonia)   . Anemia due to GI blood loss 07/11/2011  . 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 Stroke 2009    aphasia and memory impairment as residual  . Hypoxia   . Atrial fibrillation, permanent, rate controlled on coumadin 05/10/2011    Past Surgical History  Procedure Laterality Date  . Hip fracture surgery    . Cholecystectomy    . Knee surgery    . Cataract extraction       Recommendations for primary care physician for things to follow:   Follow weight  INR, BMP closely, repeat CBC and iron levels in 3-4 weeks.   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 female,  with a history of hypertension, COPD, chronic atrial fibrillation, gastric AVMs, intermittent dark stools and stroke presented with two-day history of worsening shortness of breath. The patient visited the emergency department on 09/27/2012 because of left flank pain. The patient was sent home after a normal chest x-ray with Flexeril. It initially helped, but the pain continued to worsen. As a  result, the patient quit taking her furosemide at home because it was increasingly difficult to get into the bathroom due to pain. She has noted increasing dyspnea on exertion as well as at rest. No history of recent trauma or falls. Additional evaluation in the emergency department revealed proBNP 794, chest x-ray showing pulmonary edema with small bilateral pleural effusions(worsen since 09/27/12), EKG with chronic right bundle branch block, hemoglobin 8.0. The patient had a hemoglobin of 10.5 on 07/25/2012. Her baseline hemoglobin is 10-11. In emergency department, the patient received aerosolized albuterol and Atrovent as well as IV Solu-Medrol 125 mg and furosemide 40 mg IV x1, however further workup was more suggestive towards acute on chronic diastolic CHF,  patient had recently stopped taking her Lasix, with IV Lasix she felt much better and is close to baseline and not requiring any oxygen. She was seen by cardiologist Dr.Croitoru who I discussed the case with in detail today, stable for discharge, she will follow with her primary cardiologist Dr. Rennis Golden post discharge.      1. Acute on chronic diastolic CHF: Secondary to recent noncompliance with diuretics and anemia. Clinically improving. 2-D echo: LVEF 55-60%. Cardiology cleared the patient for discharge are discussed the case with him personally today, she has been educated on dietary compliance, fluid restriction and medication compliance. She is off of oxygen is not short of breath at this time, she will be discharged home with home PT and RN. She is agreeable to this plan and is eager to go home.    2. Iron deficiency anemia: Status post 1 unit PRBC on 4/7-improved. Continue iron supplements. S/P IV Iron on 4/5-consider repeating ferritin levels in a month's time. Per daughter, patient does not agree to endoscopies. Followup with primary hematologist as OP.   3. COPD: Continue home chronic prednisone 10 mg every other day. Bronchodilator nebulizations. Probably made worse by decompensated CHF. Improved.   4. Permanent atrial fibrillation: Rate controlled. Anticoagulated. Continue Cardizem. Cardiology following.    5. Left flank pain: CT chest and abdomen without acute findings. Resolved.? Radicular pain from degenerative thoracic spine disease.   6. HTN: Controlled.   7. History of gastric AVMs and? Intermittent melena: Check FOBT. PPIs. Patient declines endoscopies.   8. History of CVA: Anticoagulated on Coumadin. Request PCP to monitor INR closely.   9. Hypokalemia: Repleted     Note patient was originally discharged yesterday however she developed some nausea and complained that she did not have a bowel movement for the last 5 days, KUB was stable, exam was stable, with bowel  regimen. She had a large bowel movement and now feels better and wants to go home. We'll place her on bowel regimen upon discharge 2.      Today   Subjective:   Angelica [name] today has no headache,no chest abdominal pain,no new weakness tingling or numbness, feels much better wants to go home today with Home PT and RN.  Objective:   Blood pressure 115/52, pulse 65, temperature 97.8 F (36.6 C), temperature source Oral, resp. rate 20, height 5\' 4"  (1.626 m), weight 68.1 kg (150 lb 2.1 oz), SpO2 92.00%.   Intake/Output Summary (Last 24 hours) at 10/11/12 1058 Last data filed at 10/10/12 2300  Gross per 24 hour  Intake    610 ml  Output   2050 ml  Net  -1440 ml    Exam Awake Alert, Oriented *3, No new F.N deficits, Normal affect .AT,PERRAL Supple Neck,No JVD, No cervical lymphadenopathy appriciated.  Symmetrical  Chest wall movement, Good air movement bilaterally, CTAB RRR,No Gallops,Rubs or new Murmurs, No Parasternal Heave +ve B.Sounds, Abd Soft, Non tender, No organomegaly appriciated, No rebound -guarding or rigidity. No Cyanosis, Clubbing or edema, No new Rash or bruise  Data Review   Major procedures and Radiology Reports - PLEASE review detailed and final reports for all details in brief -       Ct Abdomen Pelvis Wo Contrast  10/06/2012  *RADIOLOGY REPORT*  Clinical Data: Left flank and back pain.  CT ABDOMEN AND PELVIS WITHOUT CONTRAST  Technique:  Multidetector CT imaging of the abdomen and pelvis was performed following the standard protocol without intravenous contrast.  Comparison: None.  Findings: Visualized lung bases show bibasilar atelectasis and a small right pleural effusion.  No renal obstruction identified.  There is a small nonobstructing calculus in the lower pole collecting system of the right kidney measuring approximately 4 mm.  The bladder is unremarkable.  The gallbladder has been removed.  There is a calcified granuloma in the right lobe of the  liver.  Splenic artery calcifications present without aneurysm.  No acute inflammatory process is identified.  Sigmoid diverticulosis present without evidence of diverticulitis.  There are some calcified mesenteric lymph nodes in the mid mesentery.  No hernias are identified.  Moderate degenerative disease of the lumbar spine.  IMPRESSION: No evidence of obstructing calculi.  A nonobstructing calculus is present in the lower pole of the right kidney.   Original Report Authenticated By: Irish Lack, M.D.    Dg Chest 2 View  10/06/2012  *RADIOLOGY REPORT*  Clinical Data: Shortness of breath and wheezing  CHEST - 2 VIEW  Comparison: 09/27/2012  Findings: There is moderate cardiac enlargement.  There are small pleural effusions noted bilaterally.  Mild interstitial edema present.  No airspace consolidation noted.  There is a scoliosis deformity affecting the thoracic spine.  IMPRESSION:  1. Suspect mild CHF.   Original Report Authenticated By: Signa Kell, M.D.       Ct Chest Wo Contrast  10/06/2012  *RADIOLOGY REPORT*  Clinical Data: Dyspnea.  Left chest pain.  CT CHEST WITHOUT CONTRAST  Technique:  Multidetector CT imaging of the chest was performed following the standard protocol without IV contrast.  Comparison: 10/06/2012 chest x-ray.  No comparison chest CT.  Findings: Cardiomegaly.  Pulmonary vascular congestion.  Small bilateral pleural effusions greater on the right.  Findings consistent with congestive heart failure.  Adenopathy may be related to the congestive heart failure.  Evaluation for pulmonary embolus or dissection not possible without contrast administration.  Mild coronary calcifications.  Calcification mitral valve and aortic valve.  Calcification of the thoracic aorta with mild ectasia.  Moderate size hiatal hernia.  Scoliosis. Mildly wedging of the T8 vertebral body with loss of height most notable inferior endplate possibly remote.  This causes narrowing of the left neural foramen.   Question of this may contribute to the patient's discomfort.  No obvious rib fracture although images slightly motion degraded.  IMPRESSION: Congestive heart failure.  Please see above.  Moderate sized hiatal hernia.  Scoliosis and degenerative changes with left T8-9 foraminal narrowing.   Original Report Authenticated By: Lacy Duverney, M.D.     Micro Results   Lab Results  Component Value Date   INR 1.46 10/11/2012   INR 1.43 10/10/2012   INR 1.71* 10/09/2012   PROTIME 18.0* 09/18/2012   PROTIME 21.6* 07/25/2012   PROTIME 25.2* 06/21/2012     No results found for this or  any previous visit (from the past 240 hour(s)).   CBC w Diff: Lab Results  Component Value Date   WBC 7.3 10/10/2012   WBC 7.4 09/18/2012   WBC 5.6 01/09/2012   HGB 9.0* 10/10/2012   HGB 8.6* 09/18/2012   HGB 10.8* 01/09/2012   HCT 30.8* 10/10/2012   HCT 30.0* 09/18/2012   HCT 34.4* 01/09/2012   PLT 406* 10/10/2012   PLT 417* 09/18/2012   PLT 324 01/09/2012   LYMPHOPCT 6* 10/06/2012   LYMPHOPCT 10.5* 09/18/2012   LYMPHOPCT 19.0 01/09/2012   BANDSPCT 0 10/06/2012   MONOPCT 2* 10/06/2012   MONOPCT 12.5 09/18/2012   MONOPCT 11.5 01/09/2012   EOSPCT 3 10/06/2012   EOSPCT 5.2 09/18/2012   EOSPCT 3.2 01/09/2012   BASOPCT 1 10/06/2012   BASOPCT 0.7 09/18/2012   BASOPCT 0.7 01/09/2012    CMP: Lab Results  Component Value Date   NA 141 10/09/2012   K 3.7 10/09/2012   CL 105 10/09/2012   CO2 33* 10/09/2012   BUN 18 10/09/2012   CREATININE 0.87 10/09/2012   PROT 5.8* 10/06/2012   ALBUMIN 3.1* 10/06/2012   BILITOT 0.2* 10/06/2012   ALKPHOS 87 10/06/2012   AST 13 10/06/2012   ALT 12 10/06/2012  .   Discharge Instructions     Follow with Primary MD Thayer Headings, MD in 4 days   Get CBC, CMP, INR checked 4 days by Primary MD and again as instructed by your Primary MD. Get a 2 view Chest X ray done next visit if you had Pneumonia of Lung problems at the Hospital.  Get Medicines reviewed and adjusted.  Please request your Prim.MD to go over all Hospital Tests and  Procedure/Radiological results at the follow up, please get all Hospital records sent to your Prim MD by signing hospital release before you go home.  Activity: As tolerated with Full fall precautions use walker/cane & assistance as needed   Diet:  Heart Healthy,  Fluid restriction 1.8 lit/day, Aspiration precautions.  Check your Weight same time everyday, if you gain over 2 pounds, or you develop in leg swelling, experience more shortness of breath or chest pain, call your Primary MD immediately. Follow Cardiac Low Salt Diet and 1.8 lit/day fluid restriction.  Disposition Home    If you experience worsening of your admission symptoms, develop shortness of breath, life threatening emergency, suicidal or homicidal thoughts you must seek medical attention immediately by calling 911 or calling your MD immediately  if symptoms less severe.  You Must read complete instructions/literature along with all the possible adverse reactions/side effects for all the Medicines you take and that have been prescribed to you. Take any new Medicines after you have completely understood and accpet all the possible adverse reactions/side effects.   Do not drive and provide baby sitting services if your were admitted for syncope or siezures until you have seen by Primary MD or a Neurologist and advised to do so again.  Do not drive when taking Pain medications.    Do not take more than prescribed Pain, Sleep and Anxiety Medications  Special Instructions: If you have smoked or chewed Tobacco  in the last 2 yrs please stop smoking, stop any regular Alcohol  and or any Recreational drug use.  Wear Seat belts while driving.        Follow-up Information   Follow up with Thayer Headings, MD. Schedule an appointment as soon as possible for a visit in 4 days.   Contact information:  [address] [place] [address] [phone]       Follow up with Chrystie Nose, MD On 10/17/2012. (at  2:00pm with Wilburt Finlay, PA)    Contact information:   [address] [place] [address] [phone]         Discharge Medications     Medication List    TAKE these medications       acetaminophen 500 MG tablet  Commonly known as:  TYLENOL  Take 1,000 mg by mouth 3 (three) times daily.     albuterol (2.5 MG/3ML) 0.083% nebulizer solution  Commonly known as:  PROVENTIL  Take 2.5 mg by nebulization 4 (four) times daily.     alendronate 70 MG tablet  Commonly known as:  FOSAMAX  Take 70 mg by mouth every 7 (seven) days. Take with a full glass of water on an empty stomach. On tuesdays     azelastine 137 MCG/SPRAY nasal spray  Commonly known as:  ASTELIN  Place 2 sprays into the nose 2 (two) times daily. Use in each nostril as directed     bisacodyl 10 MG suppository  Commonly known as:  DULCOLAX  Place 1 suppository (10 mg total) rectally as needed for constipation.     budesonide 0.5 MG/2ML nebulizer solution  Commonly known as:  PULMICORT  Take 0.5 mg by nebulization 2 (two) times daily.     cyclobenzaprine 10 MG tablet  Commonly known as:  FLEXERIL  Take 1 tablet (10 mg total) by mouth 2 (two) times daily as needed for muscle spasms.     diltiazem 180 MG 24 hr capsule  Commonly known as:  DILACOR XR  Take 180 mg by mouth daily before breakfast.     ferrous sulfate 325 (65 FE) MG tablet  Take 650 mg by mouth daily.     fluticasone 50 MCG/ACT nasal spray  Commonly known as:  FLONASE  Place 2 sprays into the nose daily.     furosemide 40 MG tablet  Commonly known as:  LASIX  Take 1 tablet (40 mg total) by mouth daily as needed (swelling).     guaiFENesin 600 MG 12 hr tablet  Commonly known as:  MUCINEX  Take 600 mg by mouth 2 (two) times daily as needed for congestion.     loratadine 10 MG tablet  Commonly known as:  CLARITIN  Take 10 mg by mouth every evening.     multivitamins ther. w/minerals Tabs  Take 1 tablet by mouth every  evening.     pantoprazole 40 MG tablet  Commonly known as:  PROTONIX  Take 40 mg by mouth daily.     polyethylene glycol powder powder  Commonly known as:  GLYCOLAX/MIRALAX  Take 17 g by mouth daily.     predniSONE 5 MG tablet  Commonly known as:  DELTASONE  Take 10 mg by mouth every other day.     Vitamin D 2000 UNITS Caps  Take 1 capsule by mouth daily.     warfarin 6 MG tablet  Commonly known as:  COUMADIN  Take 6 mg by mouth every morning.           Total Time in preparing paper work, data evaluation and todays exam - 35 minutes  Leroy Sea M.D on 10/11/2012 at 10:58 AM  [facility] Office  [phone]

## 2012-10-10 NOTE — Progress Notes (Signed)
Left message with daughter, Angelica [name], that patient discharge on hold until tomorrow. Patient states she does not feel ready to go today.

## 2012-10-10 NOTE — Progress Notes (Signed)
Pt. Seen and examined. Agree with the NP/PA-C note as written.  Breathing has markedly improved. She is lying flat. KUB today showed stool and gas - got a suppository with + stool. Feels better, no further nausea or vomiting. INR is subtherapeutic at 1.43.  Warfarin ordered - would plan to target INR of 2.0-2.5.  Will discuss with Dr. Patsy Lager office, who manages her warfarin, to see if she is a candidate for Eliquis, which has been shown to have a lower bleeding risk compared to warfarin and superiority with stroke risk reduction. As demonstrated, she will require ongoing lasix therapy for diastolic HF. Early follow-up with me or a mid-level provider in 5-7 days in our office is recommended.  Thanks for your excellent care.  Chrystie Nose, MD, [facility] Attending Cardiologist [facility]

## 2012-10-10 NOTE — Progress Notes (Signed)
Subjective: Pt with vomiting today. Currently no vomiting, KUB being done  Objective: Vital signs in last 24 hours: Temp:  [97.5 F (36.4 C)-98.2 F (36.8 C)] 98.2 F (36.8 C) (04/09 1258) Pulse Rate:  [66-94] 94 (04/09 1258) Resp:  [18-20] 18 (04/09 1258) BP: (101-121)/(55-60) 121/57 mmHg (04/09 1258) SpO2:  [93 %-95 %] 94 % (04/09 1258) Weight:  [68.2 kg (150 lb 5.7 oz)] 68.2 kg (150 lb 5.7 oz) (04/09 0506) Weight change: 0.2 kg (7.1 oz) Last BM Date: 10/06/12 Intake/Output from previous day:  -670  Wt 68.2 up from admit at 67.2 04/08 0701 - 04/09 0700 In: 480 [P.O.:480] Out: 1150 [Urine:1150] Intake/Output this shift: Total I/O In: 240 [P.O.:240] Out: 900 [Urine:900]  PE: General:alert and oriented Heart:RRR Lungs:clear ZOX:[id number] BS, no tenderness to palpation Ext:no edema.    Lab Results:  Recent Labs  10/09/12 0530 10/10/12 0510  WBC 11.2* 7.3  HGB 9.1* 9.0*  HCT 30.3* 30.8*  PLT 397 406*   BMET  Recent Labs  10/08/12 0506 10/09/12 0530  NA 141 141  K 2.8* 3.7  CL 104 105  CO2 30 33*  GLUCOSE 96 93  BUN 20 18  CREATININE 0.98 0.87  CALCIUM 8.8 9.4   No results found for this basename: TROPONINI, CK, MB,  in the last 72 hours  Lab Results  Component Value Date   CHOL 119 10/14/2011   HDL 68 10/14/2011   LDLCALC 41 10/14/2011   TRIG 51 10/14/2011   CHOLHDL 1.8 10/14/2011   Lab Results  Component Value Date   HGBA1C  Value: 4.9 (NOTE)   The ADA recommends the following therapeutic goals for glycemic   control related to Hgb A1C measurement:   Goal of Therapy:   < 7.0% Hgb A1C   Action Suggested:  > 8.0% Hgb A1C   Ref:  Diabetes Care, 22, Suppl. 1, 1999 12/22/2007     Lab Results  Component Value Date   TSH 1.314 10/06/2012      Studies/Results: 2 D Echo done 10/07/12 Left ventricle: The cavity size was normal. Wall thickness was normal. Systolic function was normal. The estimated ejection fraction was in the range of 55% to 65%.  Wall motion was normal; there were no regional wall motion abnormalities. - Mitral valve: Mild regurgitation. - Left atrium: The atrium was moderately dilated. - Right atrium: The atrium was moderately dilated. - Tricuspid valve: Moderate regurgitation. - Pulmonary arteries: PA peak pressure: 37mm Hg (S).    Medications: I have reviewed the patient's current medications. Marland Kitchen albuterol  2.5 mg Nebulization QID  . azelastine  2 spray Each Nare BID  . bisacodyl  10 mg Rectal Daily  . budesonide  0.5 mg Nebulization BID  . cholecalciferol  2,000 Units Oral Daily  . cyclobenzaprine  10 mg Oral TID  . diltiazem  180 mg Oral Daily  . docusate sodium  200 mg Oral BID  . ferrous sulfate  650 mg Oral Daily  . furosemide  40 mg Oral Daily  . loratadine  10 mg Oral QPM  . magnesium hydroxide  30 mL Oral BID  . multivitamin with minerals  1 tablet Oral Daily  . pantoprazole  40 mg Oral Daily  . potassium chloride  40 mEq Oral BID  . predniSONE  10 mg Oral QODAY  . sodium chloride  3 mL Intravenous Q12H  . 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  VS stable. Lying flat without SOB.    LOS: 4 days   Time spent with pt. :15 minutes. Fenton Candee R 10/10/2012, 2:35 PM

## 2012-10-10 NOTE — Progress Notes (Signed)
Patient had episode of vomiting.  Mostly digested food.  Paged Md re: pending discharge.  Md orders for NPO, stat KUB.  Administered anti-nausea medication.

## 2012-10-11 LAB — PROTIME-INR: INR: 1.46 (ref 0.00–1.49)

## 2012-10-11 LAB — OCCULT BLOOD X 1 CARD TO LAB, STOOL: Fecal Occult Bld: POSITIVE — AB

## 2012-10-11 MED ORDER — WARFARIN SODIUM 7.5 MG PO TABS
7.5000 mg | ORAL_TABLET | Freq: Once | ORAL | Status: AC
  Administered 2012-10-11: 7.5 mg via ORAL
  Filled 2012-10-11: qty 1

## 2012-10-11 MED ORDER — POLYETHYLENE GLYCOL 3350 17 GM/SCOOP PO POWD: 17.0000 g | Freq: Every day | ORAL | Status: DC

## 2012-10-11 MED ORDER — BISACODYL 10 MG RE SUPP: 10.0000 mg | RECTAL | Status: DC | PRN

## 2012-10-11 NOTE — Progress Notes (Signed)
ANTICOAGULATION CONSULT NOTE - Follow-up Consult  Pharmacy Consult for warfarin Indication: atrial fibrillation  Allergies  Allergen Reactions  . Amoxicillin Hives  . Other     Ragweed--wheezing, congestion, etc  . Penicillins Hives    Patient Measurements: Height: 5\' 4"  (162.6 cm) Weight: 150 lb 2.1 oz (68.1 kg) IBW/kg (Calculated) : 54.7  Vital Signs: Temp: 97.8 F (36.6 C) (04/10 0531) Temp src: Oral (04/10 0531) BP: 115/52 mmHg (04/10 0531) Pulse Rate: 65 (04/10 0531)  Labs:  Recent Labs  10/09/12 0530 10/10/12 0510 10/11/12 0511  HGB 9.1* 9.0*  --   HCT 30.3* 30.8*  --   PLT 397 406*  --   LABPROT 19.5* 17.1* 17.3*  INR 1.71* 1.43 1.46  CREATININE 0.87  --   --     Estimated Creatinine Clearance: 43.2 ml/min (by C-G formula based on Cr of 0.87).   Medical History: Past Medical History  Diagnosis Date  . Atrial fibrillation   . Hypertension   . Arthritis   . GERD (gastroesophageal reflux disease)   . COPD (chronic obstructive pulmonary disease)   . PNA (pneumonia)   . Anemia due to GI blood loss 07/11/2011  . 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 Stroke 2009    aphasia and memory impairment as residual  . Hypoxia   . Atrial fibrillation, permanent, rate controlled on coumadin 05/10/2011    Medications:  Scheduled:  . albuterol  2.5 mg Nebulization QID  . azelastine  2 spray Each Nare BID  . bisacodyl  10 mg Rectal BID  . budesonide  0.5 mg Nebulization BID  . cholecalciferol  2,000 Units Oral Daily  . cyclobenzaprine  10 mg Oral TID  . diltiazem  180 mg Oral Daily  . docusate sodium  200 mg Oral BID  . ferrous sulfate  650 mg Oral Daily  . furosemide  40 mg Oral Daily  . loratadine  10 mg Oral QPM  . magnesium hydroxide  30 mL Oral BID  . multivitamin with minerals  1 tablet Oral Daily  . pantoprazole  40 mg Oral Daily  . potassium chloride  40  mEq Oral BID  . predniSONE  10 mg Oral QODAY  . sodium chloride  3 mL Intravenous Q12H  . [COMPLETED] warfarin  7.5 mg Oral Q1200  . Warfarin - Pharmacist Dosing Inpatient   Does not apply q1800  . [DISCONTINUED] bisacodyl  10 mg Rectal Daily  . [DISCONTINUED] magnesium hydroxide  30 mL Oral Daily  . [DISCONTINUED] magnesium hydroxide  30 mL Oral BID   Infusions:    Assessment: 79 year old female with a history of hypertension, COPD, chronic atrial fibrillation, and stroke presents with two-day history of worsening shortness of breath and left flank pain. Note patient visited ER on 3/27 for left flank pain as well.  Coumadin dosing resumed per pharmacy.   Home dose of warfarin 6mg  daily.   CBC: Hgb stable overnight. Pt recieved 1 unit of PRBC 4/7. (Hgb has been stable in the 8's, received Nulecit 4/5), platelets WNL, no bleeding reported/documented  SCr WNL  Cardiac diet  Noted specific lower INR goal per oncology [facility] notes in [facility].     Today, INR finally started to respond.  Still subtherapeutic but will most likely start rising soon with boosted doses.  Will repeat larger dose tonight, then aim to resume home regimen.   Will give dose at noon again since  pt most likely d/c'd today. Noted cardiology thoughts on possible switch to apixaban in the future.    Goal of Therapy:  INR goal: 1.8 - 2.3   Plan:  1) Coumadin 7.5 mg po x 1 at noon 2) Daily INR   Haynes Hoehn, PharmD 10/11/2012 8:21 AM  Pager: [phone]

## 2012-10-11 NOTE — Progress Notes (Signed)
Physical Therapy Treatment Patient Details Name: Angelica [name] MRN: [id number] DOB: 31-Jan-1923 Today's Date: 10/11/2012 Time: 4098-1191 PT Time Calculation (min): 17 min  PT Assessment / Plan / Recommendation Comments on Treatment Session  Pt ambulated in hallway and reports d/c home today with family.    Follow Up Recommendations  Home health PT;Supervision for mobility/OOB     Does the patient have the potential to tolerate intense rehabilitation     Barriers to Discharge        Equipment Recommendations  None recommended by PT    Recommendations for Other Services    Frequency     Plan Discharge plan remains appropriate;Frequency remains appropriate    Precautions / Restrictions Precautions Precautions: Fall   Pertinent Vitals/Pain n/a    Mobility  Bed Mobility Bed Mobility: Supine to Sit Supine to Sit: HOB flat;6: Modified independent (Device/Increase time) Details for Bed Mobility Assistance: increased time and effort Transfers Transfers: Sit to Stand;Stand to Sit Sit to Stand: With upper extremity assist;4: Min assist;From bed Stand to Sit: With upper extremity assist;To chair/3-in-1;4: Min assist Details for Transfer Assistance: assist to rise Ambulation/Gait Ambulation/Gait Assistance: 4: Min guard Ambulation Distance (Feet): 360 Feet Assistive device: Rolling walker Ambulation/Gait Assistance Details: verbal cues for safe use of RW with turning and staying closer to RW Gait Pattern: Step-through pattern;Decreased stride length;Trunk flexed    Exercises     PT Diagnosis:    PT Problem List:   PT Treatment Interventions:     PT Goals Acute Rehab PT Goals PT Goal: Supine/Side to Sit - Progress: Met PT Goal: Sit to Stand - Progress: Progressing toward goal PT Goal: Ambulate - Progress: Progressing toward goal  Visit Information  Last PT Received On: 10/11/12 Assistance Needed: +1    Subjective Data  Subjective: I'm wet.  (soiled linen upon  entering room)   Cognition  Cognition Overall Cognitive Status: History of cognitive impairments - at baseline Arousal/Alertness: Awake/alert Behavior During Session: [facility] for tasks performed Cognition - Other Comments: hx dementia    Balance     End of Session PT - End of Session Activity Tolerance: Patient tolerated treatment well Patient left: in chair;with call bell/phone within reach;with chair alarm set   GP     Angelica [name],[name] 10/11/2012, 11:02 AM [name], PT, DPT 10/11/2012 Pager: [phone]

## 2012-10-11 NOTE — Progress Notes (Signed)
Patient had very large BM tonight. Hemoccult sent. No nausea at this time

## 2012-10-11 NOTE — Progress Notes (Signed)
Briefly visited with patient today. She looked a bit brighter than when I had seen her on Tuesday.Offered encouragement and offered a prayer for peace.

## 2012-10-12 ENCOUNTER — Telehealth: Payer: Self-pay | Admitting: Emergency Medicine

## 2012-10-12 NOTE — Telephone Encounter (Signed)
Attempted to call linda but she was not at home.  Will try back later.

## 2012-10-15 ENCOUNTER — Other Ambulatory Visit: Payer: Self-pay | Admitting: Emergency Medicine

## 2012-10-15 MED ORDER — LORATADINE 10 MG PO TABS: 10.0000 mg | ORAL_TABLET | Freq: Every evening | ORAL | Status: DC

## 2012-10-15 NOTE — Telephone Encounter (Signed)
Rx refilled.

## 2012-10-15 NOTE — Telephone Encounter (Signed)
ATC Linda but she was not home.  Will try back later

## 2012-10-16 NOTE — Telephone Encounter (Signed)
ATC Angelica [name] - She was not available - Will try back later

## 2012-10-16 NOTE — Telephone Encounter (Signed)
I spoke with linda and advised her rx was sent 10/15/12. Nothing further was eneded

## 2012-10-18 ENCOUNTER — Telehealth: Payer: Self-pay | Admitting: *Deleted

## 2012-10-18 ENCOUNTER — Other Ambulatory Visit: Payer: Medicare Other | Admitting: Lab

## 2012-10-18 ENCOUNTER — Ambulatory Visit: Payer: Self-pay | Admitting: Pharmacist

## 2012-10-18 DIAGNOSIS — I639 Cerebral infarction, unspecified: Secondary | ICD-10-CM

## 2012-10-18 DIAGNOSIS — I4821 Permanent atrial fibrillation: Secondary | ICD-10-CM

## 2012-10-18 LAB — POCT INR: INR: 2

## 2012-10-18 NOTE — Telephone Encounter (Signed)
PT 24.1/ INR 2.0 PT. TAKES COUMADIN 6MG  DAILY. SHE HAS NOT TAKEN HER COUMADIN TODAY. THIS RESULT WAS GIVEN TO THE COUMADIN CLINIC.

## 2012-10-18 NOTE — Progress Notes (Signed)
INR at goal of 1.8-2.3.  Spoke to [employer] on phone.  Angelica Glick recently discharged from hospital admitted for CHF.  Selena Batten, RN from [facility] [phone]) drew PT/INR today for Angelica [name].  Angelica. Hollinger currently taking Coumadin 6mg  daily which will continue.  Will check PT/INR in 3 weeks and will need to call [name] to set up time at [facility] or with [facility] if still seeing pt.

## 2012-10-18 NOTE — Telephone Encounter (Signed)
Rec'd call back from [facility] with [facility]. Patient still has several visits for nursing education in the home, however, no additional orders for labs for PT/INR. If future lab draws are needed, new orders will need to be faxed to [facility].

## 2012-11-05 ENCOUNTER — Encounter: Payer: Self-pay | Admitting: Pharmacist

## 2012-11-05 DIAGNOSIS — I4891 Unspecified atrial fibrillation: Secondary | ICD-10-CM

## 2012-11-05 NOTE — Progress Notes (Unsigned)
I s/w Angelica [name] over phone today since we do not have upcoming protime check for her mother. Linda requested appt in [facility] on 11/07/12.   She'd like a K+, H/H & INR drawn. We will f/u w/ Bonita Quin w/ INR result on 11/07/12. Ebony Hail, Pharm.D., CPP 11/05/2012@4 :50 PM

## 2012-11-07 ENCOUNTER — Other Ambulatory Visit (HOSPITAL_BASED_OUTPATIENT_CLINIC_OR_DEPARTMENT_OTHER): Payer: Medicare Other | Admitting: Lab

## 2012-11-07 ENCOUNTER — Telehealth: Payer: Self-pay | Admitting: Pharmacist

## 2012-11-07 DIAGNOSIS — I4891 Unspecified atrial fibrillation: Secondary | ICD-10-CM

## 2012-11-07 DIAGNOSIS — D500 Iron deficiency anemia secondary to blood loss (chronic): Secondary | ICD-10-CM

## 2012-11-07 LAB — CBC WITH DIFFERENTIAL (CANCER CENTER ONLY)
BASO#: 0.1 10*3/uL (ref 0.0–0.2)
BASO%: 1.2 % (ref 0.0–2.0)
EOS%: 11.5 % — ABNORMAL HIGH (ref 0.0–7.0)
HCT: 35.5 % (ref 34.8–46.6)
LYMPH#: 0.9 10*3/uL (ref 0.9–3.3)
MCH: 23.9 pg — ABNORMAL LOW (ref 26.0–34.0)
MCHC: 29.6 g/dL — ABNORMAL LOW (ref 32.0–36.0)
MONO%: 12.3 % (ref 0.0–13.0)
NEUT#: 4.8 10*3/uL (ref 1.5–6.5)
NEUT%: 63.5 % (ref 39.6–80.0)
RDW: 22.1 % — ABNORMAL HIGH (ref 11.1–15.7)

## 2012-11-07 LAB — BASIC METABOLIC PANEL
BUN: 10 mg/dL (ref 6–23)
CO2: 26 mEq/L (ref 19–32)
Calcium: 9.4 mg/dL (ref 8.4–10.5)
Chloride: 108 mEq/L (ref 96–112)
Creatinine, Ser: 1 mg/dL (ref 0.50–1.10)

## 2012-11-07 LAB — PROTIME-INR (CHCC SATELLITE)

## 2012-11-07 NOTE — Telephone Encounter (Signed)
Received patient's INR results today and called daughter Bonita Quin) to discuss results as INR was very low (1.0, question possible lab error?). Left voicemail for daughter to call back to discuss.

## 2012-11-08 ENCOUNTER — Ambulatory Visit: Payer: Self-pay | Admitting: Pharmacist

## 2012-11-08 DIAGNOSIS — I639 Cerebral infarction, unspecified: Secondary | ICD-10-CM

## 2012-11-08 DIAGNOSIS — I4821 Permanent atrial fibrillation: Secondary | ICD-10-CM

## 2012-11-08 NOTE — Patient Instructions (Addendum)
Take 8mg  today.  On 11/19/12, continue Coumadin 6mg  daily.    Recheck INR in ~10 days on 11/20/12 at [facility]; lab at 10:45am.

## 2012-11-08 NOTE — Progress Notes (Signed)
INR below goal today. Unexplained reason for decrease in INR. ? Erroneous lab. INR = 2 on 10/18/12. No problems to report regarding anticoagulation. No s/s of clotting reported. Miralax started recently. Pt is having daily bowel movements. One missed dose on 5/6. Pt then took 3mg  extra on 5/7 and 5/8 to make up for the missed dose. No other changes to report. Take 8mg  today.  On 11/19/12, continue Coumadin 6mg  daily.   Pt unable to return to clinic for repeat INR in 1 week.  Will recheck INR in ~ 10 days. Recheck INR in ~10 days on 11/20/12 at [facility]; lab at 10:45am.   [name] - Home: [phone]                        Cell:  [phone] This encounter was a telephone encounter. No charge visit.

## 2012-11-14 ENCOUNTER — Encounter (HOSPITAL_COMMUNITY): Payer: Self-pay | Admitting: Sports Medicine

## 2012-11-14 ENCOUNTER — Emergency Department (HOSPITAL_COMMUNITY): Payer: Medicare Other

## 2012-11-14 ENCOUNTER — Emergency Department (HOSPITAL_COMMUNITY)
Admission: EM | Admit: 2012-11-14 | Discharge: 2012-11-14 | Disposition: A | Discharge status: Home / Self Care | Payer: Medicare Other | Attending: Emergency Medicine | Admitting: Emergency Medicine

## 2012-11-14 DIAGNOSIS — S0993XA Unspecified injury of face, initial encounter: Secondary | ICD-10-CM | POA: Insufficient documentation

## 2012-11-14 DIAGNOSIS — J449 Chronic obstructive pulmonary disease, unspecified: Secondary | ICD-10-CM | POA: Insufficient documentation

## 2012-11-14 DIAGNOSIS — K219 Gastro-esophageal reflux disease without esophagitis: Secondary | ICD-10-CM | POA: Insufficient documentation

## 2012-11-14 DIAGNOSIS — Z8639 Personal history of other endocrine, nutritional and metabolic disease: Secondary | ICD-10-CM | POA: Insufficient documentation

## 2012-11-14 DIAGNOSIS — IMO0002 Reserved for concepts with insufficient information to code with codable children: Secondary | ICD-10-CM | POA: Insufficient documentation

## 2012-11-14 DIAGNOSIS — S42301A Unspecified fracture of shaft of humerus, right arm, initial encounter for closed fracture: Secondary | ICD-10-CM

## 2012-11-14 DIAGNOSIS — S199XXA Unspecified injury of neck, initial encounter: Secondary | ICD-10-CM | POA: Insufficient documentation

## 2012-11-14 DIAGNOSIS — F0390 Unspecified dementia without behavioral disturbance: Secondary | ICD-10-CM | POA: Insufficient documentation

## 2012-11-14 DIAGNOSIS — Z8673 Personal history of transient ischemic attack (TIA), and cerebral infarction without residual deficits: Secondary | ICD-10-CM | POA: Insufficient documentation

## 2012-11-14 DIAGNOSIS — Z880 Allergy status to penicillin: Secondary | ICD-10-CM | POA: Insufficient documentation

## 2012-11-14 DIAGNOSIS — Z8719 Personal history of other diseases of the digestive system: Secondary | ICD-10-CM | POA: Insufficient documentation

## 2012-11-14 DIAGNOSIS — Z7901 Long term (current) use of anticoagulants: Secondary | ICD-10-CM | POA: Insufficient documentation

## 2012-11-14 DIAGNOSIS — W010XXA Fall on same level from slipping, tripping and stumbling without subsequent striking against object, initial encounter: Secondary | ICD-10-CM | POA: Insufficient documentation

## 2012-11-14 DIAGNOSIS — I4891 Unspecified atrial fibrillation: Secondary | ICD-10-CM | POA: Insufficient documentation

## 2012-11-14 DIAGNOSIS — M436 Torticollis: Secondary | ICD-10-CM | POA: Insufficient documentation

## 2012-11-14 DIAGNOSIS — I509 Heart failure, unspecified: Secondary | ICD-10-CM | POA: Insufficient documentation

## 2012-11-14 DIAGNOSIS — F29 Unspecified psychosis not due to a substance or known physiological condition: Secondary | ICD-10-CM | POA: Insufficient documentation

## 2012-11-14 DIAGNOSIS — Y929 Unspecified place or not applicable: Secondary | ICD-10-CM | POA: Insufficient documentation

## 2012-11-14 DIAGNOSIS — J4489 Other specified chronic obstructive pulmonary disease: Secondary | ICD-10-CM | POA: Insufficient documentation

## 2012-11-14 DIAGNOSIS — Z79899 Other long term (current) drug therapy: Secondary | ICD-10-CM | POA: Insufficient documentation

## 2012-11-14 DIAGNOSIS — M129 Arthropathy, unspecified: Secondary | ICD-10-CM | POA: Insufficient documentation

## 2012-11-14 DIAGNOSIS — Z8701 Personal history of pneumonia (recurrent): Secondary | ICD-10-CM | POA: Insufficient documentation

## 2012-11-14 DIAGNOSIS — I10 Essential (primary) hypertension: Secondary | ICD-10-CM | POA: Insufficient documentation

## 2012-11-14 DIAGNOSIS — Z862 Personal history of diseases of the blood and blood-forming organs and certain disorders involving the immune mechanism: Secondary | ICD-10-CM | POA: Insufficient documentation

## 2012-11-14 DIAGNOSIS — D500 Iron deficiency anemia secondary to blood loss (chronic): Secondary | ICD-10-CM | POA: Insufficient documentation

## 2012-11-14 DIAGNOSIS — Y9389 Activity, other specified: Secondary | ICD-10-CM | POA: Insufficient documentation

## 2012-11-14 DIAGNOSIS — S42293A Other displaced fracture of upper end of unspecified humerus, initial encounter for closed fracture: Secondary | ICD-10-CM | POA: Insufficient documentation

## 2012-11-14 HISTORY — DX: Heart failure, unspecified: I50.9

## 2012-11-14 HISTORY — DX: Unspecified dementia without behavioral disturbance: F03.90

## 2012-11-14 HISTORY — DX: Cerebral infarction, unspecified: I63.9

## 2012-11-14 HISTORY — DX: Nonfamilial hypogammaglobulinemia: D80.1

## 2012-11-14 LAB — BASIC METABOLIC PANEL
Calcium: 9.2 mg/dL (ref 8.4–10.5)
Creatinine, Ser: 0.94 mg/dL (ref 0.50–1.10)
GFR calc Af Amer: 61 mL/min — ABNORMAL LOW (ref >90)

## 2012-11-14 LAB — PROTIME-INR: Prothrombin Time: 21.7 seconds — ABNORMAL HIGH (ref 11.6–15.2)

## 2012-11-14 MED ORDER — TRAMADOL HCL 50 MG PO TABS
50.0000 mg | ORAL_TABLET | Freq: Once | ORAL | Status: AC
  Administered 2012-11-14: 50 mg via ORAL
  Filled 2012-11-14: qty 1

## 2012-11-14 MED ORDER — HYDROMORPHONE HCL PF 1 MG/ML IJ SOLN
0.5000 mg | Freq: Once | INTRAMUSCULAR | Status: AC
  Administered 2012-11-14: 0.5 mg via INTRAVENOUS
  Filled 2012-11-14: qty 1

## 2012-11-14 MED ORDER — TRAMADOL HCL 50 MG PO TABS: 50.0000 mg | ORAL_TABLET | Freq: Four times a day (QID) | ORAL | Status: DC | PRN

## 2012-11-14 MED ORDER — SODIUM CHLORIDE 0.9 % IV SOLN
Freq: Once | INTRAVENOUS | Status: AC
  Administered 2012-11-14: 50 mL/h via INTRAVENOUS

## 2012-11-14 NOTE — ED Notes (Signed)
Pt in Radiology 

## 2012-11-14 NOTE — ED Provider Notes (Signed)
History     CSN: [id number]  Arrival date & time 11/14/12  1627   First MD Initiated Contact with Patient 11/14/12 1634      Chief Complaint  Patient presents with  . Fall  . Shoulder Pain   HPI Comments: Patient is an 79 year old female with a history of stroke, hypogammaglobulinemia, dementia and A. fib who is on Coumadin.  She presents emergency department today following a fall she reportedly tripped on furniture.  She reportedly landed on her right shoulder and is complaining of pain.  Sensation is intact she's able to move her hand.  She does report some neck pain as well but no loss of consciousness, blurry vision, headache.  Patient is a 79 y.o. female presenting with fall. The history is provided by the patient.  Fall The accident occurred less than 1 hour ago. The fall occurred while standing (reportedly tripped over the leg of a chair.  Pt is walker dependent and reports clear hx of fall secondary to  clipping leg of furniture). She landed on a hard floor. There was no blood loss. The point of impact was the right shoulder. The pain is present in the right shoulder. The pain is at a severity of 10/10. The pain is severe. She was not ambulatory at the scene. There was no entrapment after the fall. There was no drug use involved in the accident. There was no alcohol use involved in the accident. Pertinent negatives include no fever, no numbness, no nausea, no vomiting, no hematuria, no headaches, no hearing loss, no loss of consciousness and no tingling. The symptoms are aggravated by activity. Treatment on scene includes a c-collar, a backboard and medications. The treatment provided moderate relief.    Past Medical History  Diagnosis Date  . Atrial fibrillation   . Hypertension   . Arthritis   . GERD (gastroesophageal reflux disease)   . COPD (chronic obstructive pulmonary disease)   . PNA (pneumonia)   . Anemia due to GI blood loss 07/11/2011  . Iron deficiency anemia secondary  to blood loss (chronic) 07/11/2011  . 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 Stroke 2009    aphasia and memory impairment as residual  . Hypoxia   . Atrial fibrillation, permanent, rate controlled on coumadin 05/10/2011  . Hypogammaglobulinemia 06/01/2012    Immunoglobulins 05/01/2012-normal IgG M., normal IgA,  low IgG [phone]).   . 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 CVA history 05/10/2011  . CHF (congestive heart failure) 06/14/2011    Past Surgical History  Procedure Laterality Date  . Hip fracture surgery    . Cholecystectomy    . Knee surgery    . Cataract extraction      Family History  Problem Relation Age of Onset  . Heart disease Mother   . Rectal cancer Mother   . Leukemia Father     History  Substance Use Topics  . Smoking status: Never Smoker   . Smokeless tobacco: Never Used  . Alcohol Use: 4.2 oz/week    7 Glasses of wine per week    OB History   Grav Para Term Preterm Abortions TAB SAB Ect Mult Living                  Review of Systems  Constitutional: Negative for fever, chills, diaphoresis, activity change, appetite change and fatigue.  HENT: Positive for neck pain and neck stiffness. Negative for hearing loss, ear pain, nosebleeds, congestion, rhinorrhea, trouble swallowing, tinnitus and ear discharge.  Eyes: Negative.  Negative for visual disturbance.  Respiratory: Negative.  Negative for cough, choking, chest tightness, shortness of breath and wheezing.   Cardiovascular: Negative for chest pain, palpitations and leg swelling.  Gastrointestinal: Negative for nausea and vomiting.  Genitourinary: Negative for frequency, hematuria and difficulty urinating.  Musculoskeletal: Positive for gait problem (walker dependent).  Neurological: Positive for weakness. Negative for tingling, loss of consciousness, syncope, numbness and headaches.  Psychiatric/Behavioral: Positive for confusion.    Allergies  Amoxicillin; Other; and  Penicillins  Home Medications   Current Outpatient Rx  Name  Route  Sig  Dispense  Refill  . acetaminophen (TYLENOL) 500 MG tablet   Oral   Take 1,000 mg by mouth 3 (three) times daily.         Marland Kitchen albuterol (PROVENTIL) (2.5 MG/3ML) 0.083% nebulizer solution   Nebulization   Take 2.5 mg by nebulization 4 (four) times daily.          Marland Kitchen alendronate (FOSAMAX) 70 MG tablet   Oral   Take 70 mg by mouth every 7 (seven) days. Take with a full glass of water on an empty stomach. On tuesdays         . azelastine (ASTELIN) 137 MCG/SPRAY nasal spray   Nasal   Place 1 spray into the nose 2 (two) times daily. Use in each nostril as directed         . bisacodyl (DULCOLAX) 10 MG suppository   Rectal   Place 1 suppository (10 mg total) rectally as needed for constipation.   30 suppository   1   . budesonide (PULMICORT) 0.5 MG/2ML nebulizer solution   Nebulization   Take 0.5 mg by nebulization 2 (two) times daily.         . Cholecalciferol (VITAMIN D) 2000 UNITS CAPS   Oral   Take 1 capsule by mouth daily.          . cyclobenzaprine (FLEXERIL) 10 MG tablet   Oral   Take 10 mg by mouth 3 (three) times daily as needed for muscle spasms.         Marland Kitchen diltiazem (DILACOR XR) 180 MG 24 hr capsule   Oral   Take 180 mg by mouth daily before breakfast.          . ferrous sulfate 325 (65 FE) MG tablet   Oral   Take 650 mg by mouth daily.          . fluticasone (FLONASE) 50 MCG/ACT nasal spray   Nasal   Place 2 sprays into the nose daily.         Marland Kitchen guaiFENesin (MUCINEX) 600 MG 12 hr tablet   Oral   Take 600 mg by mouth 2 (two) times daily as needed for congestion.         Marland Kitchen loratadine (CLARITIN) 10 MG tablet   Oral   Take 10 mg by mouth daily.         . Multiple Vitamins-Minerals (MULTIVITAMINS THER. W/MINERALS) TABS   Oral   Take 1 tablet by mouth every evening.          . pantoprazole (PROTONIX) 40 MG tablet   Oral   Take 40 mg by mouth daily.          . predniSONE (DELTASONE) 5 MG tablet   Oral   Take 10 mg by mouth every other day.          . simvastatin (ZOCOR) 10 MG tablet   Oral  Take 10 mg by mouth at bedtime.         Marland Kitchen warfarin (COUMADIN) 6 MG tablet   Oral   Take 6 mg by mouth every morning.          . traMADol (ULTRAM) 50 MG tablet   Oral   Take 1 tablet (50 mg total) by mouth every 6 (six) hours as needed for pain.   15 tablet   0     BP 121/64  Pulse 77  Temp(Src) 98.6 F (37 C) (Oral)  Resp 21  SpO2 91%  Physical Exam  Nursing note and vitals reviewed. Constitutional: She appears well-developed. She appears distressed.  In C collar  HENT:  Head: Normocephalic and atraumatic.  Mouth/Throat: No oropharyngeal exudate.  Eyes: Conjunctivae are normal. Right eye exhibits no discharge. Left eye exhibits no discharge. No scleral icterus.  Neck: No JVD present. No tracheal deviation present.  Cardiovascular: Normal rate, normal heart sounds and intact distal pulses.  An irregularly irregular rhythm present. Exam reveals no gallop, no friction rub and no decreased pulses.   No murmur heard. r radial and ulnar pulses 2+/4  Pulmonary/Chest: Effort normal and breath sounds normal. No respiratory distress. She has no wheezes. She has no rales.  Abdominal: Soft. She exhibits no distension. There is no tenderness. There is no rebound and no guarding.  Musculoskeletal: She exhibits no edema.       Right shoulder: She exhibits decreased range of motion, tenderness, bony tenderness, swelling, deformity, pain and spasm. She exhibits no crepitus, no laceration and normal pulse.  Neurological: She is alert. She exhibits normal muscle tone.  Skin: Skin is warm and dry. No rash noted. She is not diaphoretic. No erythema. No pallor.  Psychiatric: She has a normal mood and affect. Her behavior is normal. Judgment and thought content normal.    ED Course  Procedures (including critical care time)  Labs Reviewed  BASIC  METABOLIC PANEL - Abnormal; Notable for the following:    Glucose, Bld 141 (*)    GFR calc non Af Amer 53 (*)    GFR calc Af Amer 61 (*)    All other components within normal limits  PROTIME-INR - Abnormal; Notable for the following:    Prothrombin Time 21.7 (*)    INR 1.98 (*)    All other components within normal limits   Dg Shoulder Right  11/14/2012   *RADIOLOGY REPORT*  Clinical Data: Fall.  RIGHT SHOULDER - 2+ VIEW  Comparison: None available.  Findings: A comminuted right humeral head fracture is present.  The fractures are minimally-displaced.  The glenohumeral joint is intact.  No additional fractures are seen.  Prominent interstitial lung markings are chronic.  The visualized right hemithorax is otherwise unremarkable.  IMPRESSION:  1.  Comminuted right humeral head fracture. 2.  Chronic interstitial coarsening of the right lung is stable.   Original Report Authenticated By: Marin Roberts, M.D.   Ct Head Wo Contrast  11/14/2012   *RADIOLOGY REPORT*  Clinical Data:  Fall.  Confusion.  Right shoulder pain. Stiff neck.  CT HEAD WITHOUT CONTRAST CT CERVICAL SPINE WITHOUT CONTRAST  Technique:  Multidetector CT imaging of the head and cervical spine was performed following the standard protocol without intravenous contrast.  Multiplanar CT image reconstructions of the cervical spine were also generated.  Comparison:  CT head without contrast 08/31/2009.  CT HEAD  Findings: Left MCA territory encephalomalacia is stable.  No acute cortical infarct, hemorrhage, or mass lesion is  present.  Atrophy and moderate white matter disease is unchanged otherwise.  The ventricles are proportionate to the degree of atrophy with some ex vacuo dilation on the left.  No significant extra-axial fluid collection is present.  Mucosal thickening is present in the maxillary sinuses bilaterally. More mild disease is present in the ethmoid air cells and right frontal sinus.  The left frontal sinus is not pneumatized.   The sphenoid sinuses are clear.  The mastoid air cells are clear bilaterally.  IMPRESSION:  1.  Stable left MCA territory encephalomalacia. 2.  Stable atrophy and white matter disease. 3.  No acute intracranial abnormality. 4.  Mild paranasal sinus disease as described.  CT CERVICAL SPINE  Findings: The cervical spine is imaged from skull base through T1- 2.  Degenerative anterolisthesis is present at C3-4.  Mild anterolisthesis is present at C4-5.  The facets are fused bilaterally at C4-5.  Chronic end plate changes are present at C5-6 and C6-7.  The vertebral body heights are maintained.  No acute fracture or traumatic subluxation is evident.  The lung apices are clear.  The soft tissues of the neck are unremarkable.  The thyroid is atrophic.  IMPRESSION:  1.  No acute fracture or traumatic subluxation. 2.  Moderate spondylosis of the cervical spine as described.   Original Report Authenticated By: Marin Roberts, M.D.   Ct Cervical Spine Wo Contrast  11/14/2012   *RADIOLOGY REPORT*  Clinical Data:  Fall.  Confusion.  Right shoulder pain. Stiff neck.  CT HEAD WITHOUT CONTRAST CT CERVICAL SPINE WITHOUT CONTRAST  Technique:  Multidetector CT imaging of the head and cervical spine was performed following the standard protocol without intravenous contrast.  Multiplanar CT image reconstructions of the cervical spine were also generated.  Comparison:  CT head without contrast 08/31/2009.  CT HEAD  Findings: Left MCA territory encephalomalacia is stable.  No acute cortical infarct, hemorrhage, or mass lesion is present.  Atrophy and moderate white matter disease is unchanged otherwise.  The ventricles are proportionate to the degree of atrophy with some ex vacuo dilation on the left.  No significant extra-axial fluid collection is present.  Mucosal thickening is present in the maxillary sinuses bilaterally. More mild disease is present in the ethmoid air cells and right frontal sinus.  The left frontal sinus is  not pneumatized.  The sphenoid sinuses are clear.  The mastoid air cells are clear bilaterally.  IMPRESSION:  1.  Stable left MCA territory encephalomalacia. 2.  Stable atrophy and white matter disease. 3.  No acute intracranial abnormality. 4.  Mild paranasal sinus disease as described.  CT CERVICAL SPINE  Findings: The cervical spine is imaged from skull base through T1- 2.  Degenerative anterolisthesis is present at C3-4.  Mild anterolisthesis is present at C4-5.  The facets are fused bilaterally at C4-5.  Chronic end plate changes are present at C5-6 and C6-7.  The vertebral body heights are maintained.  No acute fracture or traumatic subluxation is evident.  The lung apices are clear.  The soft tissues of the neck are unremarkable.  The thyroid is atrophic.  IMPRESSION:  1.  No acute fracture or traumatic subluxation. 2.  Moderate spondylosis of the cervical spine as described.   Original Report Authenticated By: Marin Roberts, M.D.     1. Humerus fracture, right, closed, initial encounter       MDM  Given fall with pain central @ C2 and hx of coumadin will obtain CT Head & Neck.  Xray  shoulder.  Basic Labs + INR.  1919 - Fx noted on 1 v of shoulder.  Given anterior prominence on exam concern for anterior displacement versus hematoma.  Discussed with radiology and with Dr. Valentina Gu recommend CT scan to assess for displacement versus dislocation. CT scan consistent with impacted fracture.  Will plan to provide sling and pain medications.  Will have followup with Dr. Valentina Gu as outpatient.  SW discussed with pt and son for home resources given walker dependence.          Andrena Mews, DO 11/15/12 0006

## 2012-11-14 NOTE — ED Notes (Addendum)
Bedside report received from previous RN. Dr. Jeraldine Loots at bedside. Pt's family is expressing concern about the pt going home since she would be unable to care for herself and the only other person there is her husband who has dementia. Pt's family member states that he would not be available to care for her full time during her recovery. Pt's O2 sats dropping into mid 80's. Placed on 2L [place].

## 2012-11-14 NOTE — ED Notes (Signed)
Bed:WA15<BR> Expected date:<BR> Expected time:<BR> Means of arrival:<BR> Comments:<BR> ems 

## 2012-11-14 NOTE — ED Notes (Addendum)
Per ems pt is from home. Pt tripped over chair and fell. Hx of stroke with memory impairment. Alert and oriented x4, but gets confused at times. C/o right shoulder pain, en route c/o of stiff neck, c collar applied.

## 2012-11-15 NOTE — ED Provider Notes (Signed)
I saw and evaluated the patient, reviewed the resident's note and I agree with the findings and plan.   Date: 11/15/2012  Rate: 63  Rhythm: atrial fibrillation  QRS Axis: normal  Intervals: normal  ST/T Wave abnormalities: normal  Conduction Disutrbances:right bundle branch block  Narrative Interpretation:   Old EKG Reviewed: none available ABNORMAL  I spent a considerable amount of time with the patient and her son.  Specifically, once was clear the patient had an impacted proximal humerus fracture we discussed at length disposition.  It was clear that the patient's son preferred admission, and I discussed this with the hospitalist team who stated the patient did not qualify for admission given her isolated extremity fracture.  I subsequently spoke with SW regarding placement options.  We confirmed the patient's insurance status (and the fact that absent coverage beyond Medicare she would not qualify for paid NH / rehab placement.  I conveyed that the family could pay out of pocket for either inpatient hospitalization or rhab stay.  They deferred.  After multiple conversations regarding these options, I arranged with social worker next-day home health visit for assessment of her options for care, and the patient was discharged in stable condition.     Gerhard Munch, MD 11/15/12 [phone]

## 2012-11-19 ENCOUNTER — Telehealth: Payer: Self-pay | Admitting: Pharmacist

## 2012-11-19 NOTE — Telephone Encounter (Signed)
Angelica [name] called on Mon to get a later lab appmt time for Tue, May 20 on [place] Spoke with [name] and noon is the latest appmt time Relayed message through son and he will let [name] know 12:00 is the lab appmt time in [place]. Son stated mom broke her arm last week and her daily activity is day to day at this point so they will be at the appmt if Tue is a "good" day

## 2012-11-20 ENCOUNTER — Other Ambulatory Visit (HOSPITAL_BASED_OUTPATIENT_CLINIC_OR_DEPARTMENT_OTHER): Payer: Medicare Other | Admitting: Lab

## 2012-11-20 ENCOUNTER — Ambulatory Visit: Payer: Self-pay | Admitting: Pharmacist

## 2012-11-20 ENCOUNTER — Other Ambulatory Visit: Payer: Medicare Other | Admitting: Lab

## 2012-11-20 DIAGNOSIS — I639 Cerebral infarction, unspecified: Secondary | ICD-10-CM

## 2012-11-20 DIAGNOSIS — D500 Iron deficiency anemia secondary to blood loss (chronic): Secondary | ICD-10-CM

## 2012-11-20 DIAGNOSIS — I4821 Permanent atrial fibrillation: Secondary | ICD-10-CM

## 2012-11-20 LAB — POCT INR: INR: 4.26

## 2012-11-20 LAB — CBC WITH DIFFERENTIAL (CANCER CENTER ONLY)
BASO%: 0.9 % (ref 0.0–2.0)
EOS%: 10.4 % — ABNORMAL HIGH (ref 0.0–7.0)
HCT: 31.3 % — ABNORMAL LOW (ref 34.8–46.6)
LYMPH%: 9 % — ABNORMAL LOW (ref 14.0–48.0)
MCHC: 29.7 g/dL — ABNORMAL LOW (ref 32.0–36.0)
MCV: 77 fL — ABNORMAL LOW (ref 81–101)
MONO#: 1.1 10*3/uL — ABNORMAL HIGH (ref 0.1–0.9)
NEUT%: 69.4 % (ref 39.6–80.0)
RDW: 19.8 % — ABNORMAL HIGH (ref 11.1–15.7)

## 2012-11-20 NOTE — Progress Notes (Signed)
I spoke to Angelica [name] on phone: INR = 4.26 today.  Angelica [name] fell and broke arm last week.  She has been taking APAP 4g/day and tramadol for pain.  This may have attributed to elevated INR.  I told Angelica [name] that her mom really should not be taking more that APAP 3g/day.  If Angelica [name] continues to be in a lot of pain, I suggested, Angelica [name] call the MD to get a prescription for different pain medication.  Angelica [name] has already taken today's coumadin dose.  Will hold coumadin tomorrow and Thurs.  Will check PT/INR on Friday at [facility].  Angelica [name] will not stay for coumadin clinic appt and will need to call Angelica [name] for INR results and further coumadin instructions.

## 2012-11-23 ENCOUNTER — Ambulatory Visit (HOSPITAL_BASED_OUTPATIENT_CLINIC_OR_DEPARTMENT_OTHER): Payer: Medicare Other | Admitting: Pharmacist

## 2012-11-23 ENCOUNTER — Other Ambulatory Visit: Payer: Medicare Other

## 2012-11-23 DIAGNOSIS — K5520 Angiodysplasia of colon without hemorrhage: Secondary | ICD-10-CM

## 2012-11-23 DIAGNOSIS — D500 Iron deficiency anemia secondary to blood loss (chronic): Secondary | ICD-10-CM

## 2012-11-23 DIAGNOSIS — I4891 Unspecified atrial fibrillation: Secondary | ICD-10-CM

## 2012-11-23 DIAGNOSIS — I6350 Cerebral infarction due to unspecified occlusion or stenosis of unspecified cerebral artery: Secondary | ICD-10-CM

## 2012-11-23 DIAGNOSIS — I4821 Permanent atrial fibrillation: Secondary | ICD-10-CM

## 2012-11-23 DIAGNOSIS — I639 Cerebral infarction, unspecified: Secondary | ICD-10-CM

## 2012-11-23 LAB — POCT INR: INR: 1.83

## 2012-11-23 LAB — PROTHROMBIN TIME: INR: 1.83 — ABNORMAL HIGH (ref <1.50)

## 2012-11-23 NOTE — Progress Notes (Signed)
INR within goal today after holding coumadin x 2 days. Pt has stopped using Tylenol all together. She has only had 1-2 tablets of Tramadol/day for the last 2 days. Per daughter report, the pain seems to be controlled and the pain level is decreased. No other changes to report. No problems regarding anticoagulation. Since Tylenol use has stopped and Tramadol use has decreased significantly, will resume coumadin today. Resume 6mg  daily. This has been a relatively stable dose for pt. INR likely increased due to increased Tylenol (and Tramadol) use. Will recheck INR in 2 weeks (per daughter's request) at [facility]; lab at 11:15am. Bonita Quin (pt daughter) knows to call us if Tylenol and Tramadol use increases significantly in the next 2 weeks or if pt develops any unusual bruising or bleeding. This encounter is a no charge. I spoke with pt daughter by phone.

## 2012-11-23 NOTE — Patient Instructions (Signed)
Resume coumadin 6mg  daily. Recheck INR in 2 weeks on 12/06/12: lab at 11:15am at [facility].

## 2012-12-06 ENCOUNTER — Other Ambulatory Visit (HOSPITAL_BASED_OUTPATIENT_CLINIC_OR_DEPARTMENT_OTHER): Payer: Medicare Other | Admitting: Lab

## 2012-12-06 ENCOUNTER — Ambulatory Visit (HOSPITAL_BASED_OUTPATIENT_CLINIC_OR_DEPARTMENT_OTHER): Payer: Medicare Other | Admitting: Pharmacist

## 2012-12-06 ENCOUNTER — Other Ambulatory Visit: Payer: Self-pay | Admitting: Internal Medicine

## 2012-12-06 DIAGNOSIS — K5520 Angiodysplasia of colon without hemorrhage: Secondary | ICD-10-CM

## 2012-12-06 DIAGNOSIS — I4891 Unspecified atrial fibrillation: Secondary | ICD-10-CM

## 2012-12-06 DIAGNOSIS — D500 Iron deficiency anemia secondary to blood loss (chronic): Secondary | ICD-10-CM

## 2012-12-06 DIAGNOSIS — I639 Cerebral infarction, unspecified: Secondary | ICD-10-CM

## 2012-12-06 DIAGNOSIS — I4821 Permanent atrial fibrillation: Secondary | ICD-10-CM

## 2012-12-06 LAB — FERRITIN: Ferritin: 19 ng/mL (ref 10–291)

## 2012-12-06 LAB — CBC WITH DIFFERENTIAL (CANCER CENTER ONLY)
BASO#: 0 10*3/uL (ref 0.0–0.2)
EOS%: 4.2 % (ref 0.0–7.0)
HGB: 8.3 g/dL — ABNORMAL LOW (ref 11.6–15.9)
LYMPH%: 3.2 % — ABNORMAL LOW (ref 14.0–48.0)
MCH: 22.2 pg — ABNORMAL LOW (ref 26.0–34.0)
MCHC: 28.4 g/dL — ABNORMAL LOW (ref 32.0–36.0)
MCV: 78 fL — ABNORMAL LOW (ref 81–101)
MONO%: 5.2 % (ref 0.0–13.0)
NEUT#: 9.4 10*3/uL — ABNORMAL HIGH (ref 1.5–6.5)
NEUT%: 87.1 % — ABNORMAL HIGH (ref 39.6–80.0)

## 2012-12-06 LAB — PROTIME-INR (CHCC SATELLITE): INR: 2.6 (ref 2.0–3.5)

## 2012-12-06 LAB — IRON AND TIBC

## 2012-12-06 NOTE — Patient Instructions (Signed)
Hold Coumadin tomorrow (12/07/12).  Take coumadin 4mg  on 12/08/12.  Resume Coumadin 6mg  daily on 12/09/12.  Recheck INR in 2 weeks at [facility]; Lab at 11:15am. [name] aware of date and time.

## 2012-12-06 NOTE — Progress Notes (Signed)
INR slightly above goal. Goal INR = 1.8-2.3. No problems to report. Hg decreased slightly.  Hg on 11/20/12 = 9.3 and Hg today = 8.3 No changes in diet, medications, etc. Pt is not using any acetaminophen currently. She only takes one tramadol tablet each night. Bonita Quin (pt daughter) prefers for pt INR to be near 2. Pt already took coumadin 6mg  today. Pt has the 4mg  tablets. Will hold Coumadin tomorrow (12/07/12).  Take coumadin 4mg  on 12/08/12.  Resume Coumadin 6mg  daily on 12/09/12.  Hopefully this will bring INR closer to 2. Recheck INR in 2 weeks at [facility]; Lab at 11:15am. [name] aware of date and time. Spoke with pt daughter by phone. This is a no charge encounter.

## 2012-12-07 NOTE — Telephone Encounter (Signed)
Rx has been sent in per CY. 

## 2012-12-07 NOTE — Telephone Encounter (Signed)
Please advise if okay to refill for patient or if this should go through RB who sees her for COPD. Thanks.

## 2012-12-07 NOTE — Telephone Encounter (Signed)
Ok to continue prednisone 10 mg every other day until next ov with either me or [place], whichever is first.

## 2012-12-10 ENCOUNTER — Telehealth: Payer: Self-pay | Admitting: Hematology & Oncology

## 2012-12-10 ENCOUNTER — Encounter: Payer: Self-pay | Admitting: Oncology

## 2012-12-10 NOTE — Telephone Encounter (Signed)
Left daughter message to call and schedule appointment. Myrtle RN called wants [place] to schedule 1 hr iron for pt and let her know the date. [phone])

## 2012-12-10 NOTE — Telephone Encounter (Signed)
Angelica [name] called made 6-19 iron infusion after lab appointment transferred call to [facility] RN she wants to add another iron and had other questions for Nurse. Left RN message

## 2012-12-12 ENCOUNTER — Other Ambulatory Visit: Payer: Self-pay | Admitting: Oncology

## 2012-12-14 ENCOUNTER — Telehealth: Payer: Self-pay | Admitting: Oncology

## 2012-12-14 NOTE — Telephone Encounter (Signed)
called pt and left message regarding appt @ [place] on 6/19

## 2012-12-20 ENCOUNTER — Other Ambulatory Visit: Payer: Medicare Other | Admitting: Lab

## 2012-12-20 ENCOUNTER — Other Ambulatory Visit (HOSPITAL_BASED_OUTPATIENT_CLINIC_OR_DEPARTMENT_OTHER): Payer: Medicare Other | Admitting: Lab

## 2012-12-20 ENCOUNTER — Ambulatory Visit: Payer: Self-pay | Admitting: Pharmacist

## 2012-12-20 ENCOUNTER — Ambulatory Visit (HOSPITAL_BASED_OUTPATIENT_CLINIC_OR_DEPARTMENT_OTHER): Payer: Medicare Other

## 2012-12-20 ENCOUNTER — Other Ambulatory Visit: Payer: Self-pay | Admitting: *Deleted

## 2012-12-20 ENCOUNTER — Other Ambulatory Visit: Payer: Self-pay | Admitting: Pharmacist

## 2012-12-20 VITALS — BP 128/60 | HR 76 | Temp 97.6°F | Resp 20

## 2012-12-20 DIAGNOSIS — I4821 Permanent atrial fibrillation: Secondary | ICD-10-CM

## 2012-12-20 DIAGNOSIS — I4891 Unspecified atrial fibrillation: Secondary | ICD-10-CM

## 2012-12-20 DIAGNOSIS — D500 Iron deficiency anemia secondary to blood loss (chronic): Secondary | ICD-10-CM

## 2012-12-20 DIAGNOSIS — I639 Cerebral infarction, unspecified: Secondary | ICD-10-CM

## 2012-12-20 LAB — CBC WITH DIFFERENTIAL (CANCER CENTER ONLY)
BASO#: 0 10*3/uL (ref 0.0–0.2)
Eosinophils Absolute: 0.5 10*3/uL (ref 0.0–0.5)
HCT: 32.5 % — ABNORMAL LOW (ref 34.8–46.6)
HGB: 9.1 g/dL — ABNORMAL LOW (ref 11.6–15.9)
LYMPH%: 13.3 % — ABNORMAL LOW (ref 14.0–48.0)
MCH: 22.6 pg — ABNORMAL LOW (ref 26.0–34.0)
MCV: 81 fL (ref 81–101)
MONO#: 0.8 10*3/uL (ref 0.1–0.9)
MONO%: 10.8 % (ref 0.0–13.0)
NEUT%: 68.2 % (ref 39.6–80.0)
RBC: 4.02 10*6/uL (ref 3.70–5.32)
WBC: 7.1 10*3/uL (ref 3.9–10.0)

## 2012-12-20 LAB — PROTIME-INR (CHCC SATELLITE)

## 2012-12-20 LAB — POCT INR: INR: 1.8

## 2012-12-20 MED ORDER — SODIUM CHLORIDE 0.9 % IV SOLN
Freq: Once | INTRAVENOUS | Status: AC
  Administered 2012-12-20: 12:00:00 via INTRAVENOUS

## 2012-12-20 MED ORDER — SODIUM CHLORIDE 0.9 % IV SOLN
1020.0000 mg | Freq: Once | INTRAVENOUS | Status: AC
  Administered 2012-12-20: 1020 mg via INTRAVENOUS
  Filled 2012-12-20: qty 34

## 2012-12-20 NOTE — Patient Instructions (Addendum)
Ferumoxytol injection What is this medicine? FERUMOXYTOL is an iron complex. Iron is used to make healthy red blood cells, which carry oxygen and nutrients throughout the body. This medicine is used to treat iron deficiency anemia in people with chronic kidney disease. This medicine may be used for other purposes; ask your health care provider or pharmacist if you have questions. What should I tell my health care provider before I take this medicine? They need to know if you have any of these conditions: -anemia not caused by low iron levels -high levels of iron in the blood -magnetic resonance imaging (MRI) test scheduled -an unusual or allergic reaction to iron, other medicines, foods, dyes, or preservatives -pregnant or trying to get pregnant -breast-feeding How should I use this medicine? This medicine is for infusion into a vein. It is given by a health care professional in a hospital or clinic setting. Talk to your pediatrician regarding the use of this medicine in children. Special care may be needed. Overdosage: If you think you've taken too much of this medicine contact a poison control center or emergency room at once. Overdosage: If you think you have taken too much of this medicine contact a poison control center or emergency room at once. NOTE: This medicine is only for you. Do not share this medicine with others. What if I miss a dose? It is important not to miss your dose. Call your doctor or health care professional if you are unable to keep an appointment. What may interact with this medicine? This medicine may interact with the following medications: -other iron products This list may not describe all possible interactions. Give your health care provider a list of all the medicines, herbs, non-prescription drugs, or dietary supplements you use. Also tell them if you smoke, drink alcohol, or use illegal drugs. Some items may interact with your medicine. What should I watch  for while using this medicine? Visit your doctor or healthcare professional regularly. Tell your doctor or healthcare professional if your symptoms do not start to get better or if they get worse. You may need blood work done while you are taking this medicine. You may need to follow a special diet. Talk to your doctor. Foods that contain iron include: whole grains/cereals, dried fruits, beans, or peas, leafy green vegetables, and organ meats (liver, kidney). What side effects may I notice from receiving this medicine? Side effects that you should report to your doctor or health care professional as soon as possible: -allergic reactions like skin rash, itching or hives, swelling of the face, lips, or tongue -breathing problems -changes in blood pressure -feeling faint or lightheaded, falls -fever or chills -flushing, sweating, or hot feelings -swelling of the ankles or feet Side effects that usually do not require medical attention (Report these to your doctor or health care professional if they continue or are bothersome.): -diarrhea -headache -nausea, vomiting -stomach pain This list may not describe all possible side effects. Call your doctor for medical advice about side effects. You may report side effects to FDA at 1-800-FDA-1088. Where should I keep my medicine? This drug is given in a hospital or clinic and will not be stored at home. NOTE: This sheet is a summary. It may not cover all possible information. If you have questions about this medicine, talk to your doctor, pharmacist, or health care provider.  2013, Elsevier/Gold Standard. (03/12/2008 9:48:25 PM)  

## 2012-12-20 NOTE — Progress Notes (Signed)
INR has returned to goal of 1.8-2.3 on coumadni 6mg  daily.  No changes to report.  Ms Persing in [facility] receiving Feraheme today.  I spoke to Ms Grosso and [name] today at [facility].  Ms Braun will return to [facility] for labs on 7/3 prior to appt with Lonna Cobb, NP on 7/8.  Will check PT/INR at that time.

## 2012-12-31 ENCOUNTER — Ambulatory Visit: Payer: Medicare Other | Attending: Orthopedic Surgery | Admitting: Physical Therapy

## 2012-12-31 DIAGNOSIS — W19XXXA Unspecified fall, initial encounter: Secondary | ICD-10-CM | POA: Insufficient documentation

## 2012-12-31 DIAGNOSIS — M25519 Pain in unspecified shoulder: Secondary | ICD-10-CM | POA: Insufficient documentation

## 2012-12-31 DIAGNOSIS — IMO0001 Reserved for inherently not codable concepts without codable children: Secondary | ICD-10-CM | POA: Insufficient documentation

## 2012-12-31 DIAGNOSIS — M25619 Stiffness of unspecified shoulder, not elsewhere classified: Secondary | ICD-10-CM | POA: Insufficient documentation

## 2013-01-01 ENCOUNTER — Ambulatory Visit: Payer: Medicare Other | Admitting: Physical Therapy

## 2013-01-02 ENCOUNTER — Telehealth: Payer: Self-pay | Admitting: Hematology & Oncology

## 2013-01-02 ENCOUNTER — Ambulatory Visit: Payer: Medicare Other | Attending: Orthopedic Surgery | Admitting: Physical Therapy

## 2013-01-02 DIAGNOSIS — IMO0001 Reserved for inherently not codable concepts without codable children: Secondary | ICD-10-CM | POA: Insufficient documentation

## 2013-01-02 DIAGNOSIS — M25519 Pain in unspecified shoulder: Secondary | ICD-10-CM | POA: Insufficient documentation

## 2013-01-02 DIAGNOSIS — W19XXXA Unspecified fall, initial encounter: Secondary | ICD-10-CM | POA: Insufficient documentation

## 2013-01-02 DIAGNOSIS — M25619 Stiffness of unspecified shoulder, not elsewhere classified: Secondary | ICD-10-CM | POA: Insufficient documentation

## 2013-01-02 NOTE — Telephone Encounter (Signed)
Patient's daughter Bonita Quin called and cx 01/03/13 apt

## 2013-01-03 ENCOUNTER — Other Ambulatory Visit: Payer: Medicare Other | Admitting: Lab

## 2013-01-07 ENCOUNTER — Ambulatory Visit: Payer: Medicare Other | Admitting: Physical Therapy

## 2013-01-08 ENCOUNTER — Ambulatory Visit (HOSPITAL_BASED_OUTPATIENT_CLINIC_OR_DEPARTMENT_OTHER): Payer: Medicare Other | Admitting: Pharmacist

## 2013-01-08 ENCOUNTER — Ambulatory Visit (HOSPITAL_BASED_OUTPATIENT_CLINIC_OR_DEPARTMENT_OTHER): Payer: Medicare Other | Admitting: Nurse Practitioner

## 2013-01-08 ENCOUNTER — Telehealth: Payer: Self-pay | Admitting: Oncology

## 2013-01-08 ENCOUNTER — Other Ambulatory Visit (HOSPITAL_BASED_OUTPATIENT_CLINIC_OR_DEPARTMENT_OTHER): Payer: Medicare Other | Admitting: Lab

## 2013-01-08 VITALS — BP 124/56 | HR 71 | Temp 97.7°F | Resp 20 | Ht 64.0 in | Wt 153.8 lb

## 2013-01-08 DIAGNOSIS — I6350 Cerebral infarction due to unspecified occlusion or stenosis of unspecified cerebral artery: Secondary | ICD-10-CM

## 2013-01-08 DIAGNOSIS — I639 Cerebral infarction, unspecified: Secondary | ICD-10-CM

## 2013-01-08 DIAGNOSIS — K5520 Angiodysplasia of colon without hemorrhage: Secondary | ICD-10-CM

## 2013-01-08 DIAGNOSIS — I4821 Permanent atrial fibrillation: Secondary | ICD-10-CM

## 2013-01-08 DIAGNOSIS — D500 Iron deficiency anemia secondary to blood loss (chronic): Secondary | ICD-10-CM

## 2013-01-08 DIAGNOSIS — I4891 Unspecified atrial fibrillation: Secondary | ICD-10-CM

## 2013-01-08 DIAGNOSIS — Z7901 Long term (current) use of anticoagulants: Secondary | ICD-10-CM

## 2013-01-08 LAB — CBC WITH DIFFERENTIAL/PLATELET
Basophils Absolute: 0.1 10*3/uL (ref 0.0–0.1)
Eosinophils Absolute: 0.4 10*3/uL (ref 0.0–0.5)
HGB: 9.6 g/dL — ABNORMAL LOW (ref 11.6–15.9)
MONO#: 0.6 10*3/uL (ref 0.1–0.9)
NEUT#: 3.6 10*3/uL (ref 1.5–6.5)
RDW: 24.1 % — ABNORMAL HIGH (ref 11.2–14.5)
lymph#: 0.8 10*3/uL — ABNORMAL LOW (ref 0.9–3.3)

## 2013-01-08 LAB — PROTIME-INR: INR: 2.2 (ref 2.00–3.50)

## 2013-01-08 NOTE — Patient Instructions (Addendum)
Continue Coumadin 6mg  daily. Recheck INR on 02/06/13 at [facility]; Lab at 10:30am. [name] aware of date and time

## 2013-01-08 NOTE — Progress Notes (Signed)
PT seen in clinic today prior to appmt with Lonna Cobb INR=2.2 on 6 mg daily Continue Coumadin 6mg  daily.  Recheck INR on 02/06/13 at [facility]; Lab at 10:30am.  [name] aware of date and time

## 2013-01-08 NOTE — Progress Notes (Signed)
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 She is maintained on chronic Coumadin anticoagulation due to a previous stroke. She is seen today for scheduled followup.  Since her last visit 6 months ago her daughter reports she has been hospitalized for CHF. She then had a fall fracturing the right arm.  The ferritin returned at 19 on 12/06/2012. She received IV iron (Feraheme) on 12/20/2012.  Angelica [name] report she overall is feeling well. She denies bleeding. She continues oral iron. She has intermittent wheezing and utilizes an albuterol nebulizer.   Objective: Blood pressure 124/56, pulse 71, temperature 97.7 F (36.5 C), temperature source Oral, resp. rate 20, height 5\' 4"  (1.626 m), weight 153 lb 12.8 oz (69.763 kg).  No thrush or ulceration. Lungs with wheezes bilaterally. No respiratory distress. Irregular cardiac rhythm. Abdomen is soft and nontender. No obvious organomegaly. Trace lower leg edema bilaterally.  Lab Results: Lab Results  Component Value Date   WBC 5.6 01/08/2013   HGB 9.6* 01/08/2013   HCT 31.4* 01/08/2013   MCV 78.0* 01/08/2013   PLT 295 01/08/2013    Chemistry:    Chemistry      Component Value Date/Time   NA 139 11/14/2012 1705   K 3.6 11/14/2012 1705   CL 101 11/14/2012 1705   CO2 29 11/14/2012 1705   BUN 13 11/14/2012 1705   CREATININE 0.94 11/14/2012 1705      Component Value Date/Time   CALCIUM 9.2 11/14/2012 1705   ALKPHOS 87 10/06/2012 1356   AST 13 10/06/2012 1356   ALT 12 10/06/2012 1356   BILITOT 0.2* 10/06/2012 1356       Studies/Results: No results found.  Medications: I have reviewed the patient's current medications.  Assessment/Plan:  1. Chronic GI blood loss secondary to AV malformations of the GI tract. Hemoglobin currently at baseline. She continues oral iron. She receives periodic IV iron. 2. Hospitalization  April 2014 with diarrhea felt to be viral gastroenteritis. 3. CVA June 2009 with risk factor of chronic atrial fibrillation. Stroke occurred while on aspirin. She is maintained on chronic Coumadin anticoagulation. Coumadin is followed through our office. 4. Chronic atrial fibrillation. 5. Adult-onset asthma. 6. Hospitalization 10/06/2012 through 10/11/2012 with acute on chronic diastolic CHF. 7. Recent fall resulting in a fracture of the right arm.  Disposition-her hemoglobin remains stable. We will continue to check labs including CBC and iron studies on a 3 month schedule. She will return for a followup visit in 6 months. She continues followup with the Coumadin clinic. She will contact the office prior to her next visit with any problems.  Lonna Cobb ANP/GNP-BC

## 2013-01-08 NOTE — Telephone Encounter (Signed)
gv and printed appt sched and avs fo rlpt....lvm for Rick in [place] to sched labs....pt ok and aware

## 2013-01-09 ENCOUNTER — Ambulatory Visit: Payer: Medicare Other | Admitting: Physical Therapy

## 2013-01-10 ENCOUNTER — Telehealth: Payer: Self-pay | Admitting: Oncology

## 2013-01-10 NOTE — Telephone Encounter (Signed)
lvm for opt regarding to labs in [place]...advised pt to call [name] @ [phone] if she needs to r/s

## 2013-01-15 ENCOUNTER — Ambulatory Visit: Payer: Medicare Other | Admitting: Physical Therapy

## 2013-01-18 ENCOUNTER — Ambulatory Visit: Payer: Medicare Other | Admitting: Physical Therapy

## 2013-01-22 ENCOUNTER — Ambulatory Visit: Payer: Medicare Other | Admitting: Physical Therapy

## 2013-01-24 ENCOUNTER — Ambulatory Visit: Payer: Medicare Other | Admitting: Physical Therapy

## 2013-01-29 ENCOUNTER — Ambulatory Visit: Payer: Medicare Other | Admitting: Physical Therapy

## 2013-01-31 ENCOUNTER — Ambulatory Visit: Payer: Medicare Other | Admitting: Physical Therapy

## 2013-02-04 ENCOUNTER — Ambulatory Visit: Payer: Medicare Other | Attending: Orthopedic Surgery | Admitting: Physical Therapy

## 2013-02-04 ENCOUNTER — Other Ambulatory Visit: Payer: Self-pay | Admitting: Emergency Medicine

## 2013-02-04 DIAGNOSIS — W19XXXA Unspecified fall, initial encounter: Secondary | ICD-10-CM | POA: Insufficient documentation

## 2013-02-04 DIAGNOSIS — M25619 Stiffness of unspecified shoulder, not elsewhere classified: Secondary | ICD-10-CM | POA: Insufficient documentation

## 2013-02-04 DIAGNOSIS — IMO0001 Reserved for inherently not codable concepts without codable children: Secondary | ICD-10-CM | POA: Insufficient documentation

## 2013-02-04 DIAGNOSIS — M25519 Pain in unspecified shoulder: Secondary | ICD-10-CM | POA: Insufficient documentation

## 2013-02-06 ENCOUNTER — Other Ambulatory Visit: Payer: Self-pay

## 2013-02-06 ENCOUNTER — Other Ambulatory Visit (HOSPITAL_BASED_OUTPATIENT_CLINIC_OR_DEPARTMENT_OTHER): Payer: Medicare Other | Admitting: Lab

## 2013-02-06 ENCOUNTER — Ambulatory Visit (HOSPITAL_BASED_OUTPATIENT_CLINIC_OR_DEPARTMENT_OTHER): Payer: Medicare Other | Admitting: Pharmacist

## 2013-02-06 DIAGNOSIS — I639 Cerebral infarction, unspecified: Secondary | ICD-10-CM

## 2013-02-06 DIAGNOSIS — I4891 Unspecified atrial fibrillation: Secondary | ICD-10-CM

## 2013-02-06 DIAGNOSIS — I4821 Permanent atrial fibrillation: Secondary | ICD-10-CM

## 2013-02-06 DIAGNOSIS — I6350 Cerebral infarction due to unspecified occlusion or stenosis of unspecified cerebral artery: Secondary | ICD-10-CM

## 2013-02-06 NOTE — Progress Notes (Signed)
INR = 2.2 (drawn externally at [facility]) Pt is taking Coumadin 6 mg daily. I s/w pts dtr, [name] over the phone.  She stated her mother has had LE edema & some SOB & thus, Bonita Quin has given her mother a little more Lasix.  Her mother has not had any chest pains. INR at goal.  No change to Coumadin dose. Pt already has lab appt 04/02/13 at [facility] at 10 am.  Bonita Quin is aware of this.  We can f/u that day w/ her protime result. Ebony Hail, Pharm.D., CPP 02/06/2013@4 :06 PM

## 2013-02-08 ENCOUNTER — Ambulatory Visit: Payer: Medicare Other | Admitting: Physical Therapy

## 2013-02-09 ENCOUNTER — Encounter: Payer: Self-pay | Admitting: *Deleted

## 2013-02-11 ENCOUNTER — Ambulatory Visit: Payer: Medicare Other | Admitting: Physical Therapy

## 2013-02-11 ENCOUNTER — Encounter: Payer: Self-pay | Admitting: Internal Medicine

## 2013-02-12 ENCOUNTER — Ambulatory Visit (INDEPENDENT_AMBULATORY_CARE_PROVIDER_SITE_OTHER): Payer: Medicare Other | Admitting: Internal Medicine

## 2013-02-12 ENCOUNTER — Encounter: Payer: Self-pay | Admitting: Internal Medicine

## 2013-02-12 VITALS — BP 134/70 | HR 67 | Ht 64.0 in | Wt 153.6 lb

## 2013-02-12 DIAGNOSIS — J449 Chronic obstructive pulmonary disease, unspecified: Secondary | ICD-10-CM

## 2013-02-12 DIAGNOSIS — I509 Heart failure, unspecified: Secondary | ICD-10-CM

## 2013-02-12 DIAGNOSIS — R531 Weakness: Secondary | ICD-10-CM

## 2013-02-12 DIAGNOSIS — I5032 Chronic diastolic (congestive) heart failure: Secondary | ICD-10-CM

## 2013-02-12 DIAGNOSIS — I4821 Permanent atrial fibrillation: Secondary | ICD-10-CM

## 2013-02-12 DIAGNOSIS — I4891 Unspecified atrial fibrillation: Secondary | ICD-10-CM

## 2013-02-12 DIAGNOSIS — I5033 Acute on chronic diastolic (congestive) heart failure: Secondary | ICD-10-CM

## 2013-02-12 DIAGNOSIS — R5383 Other fatigue: Secondary | ICD-10-CM

## 2013-02-12 DIAGNOSIS — Z79899 Other long term (current) drug therapy: Secondary | ICD-10-CM

## 2013-02-12 DIAGNOSIS — Z7901 Long term (current) use of anticoagulants: Secondary | ICD-10-CM

## 2013-02-12 DIAGNOSIS — R0600 Dyspnea, unspecified: Secondary | ICD-10-CM

## 2013-02-12 DIAGNOSIS — R0609 Other forms of dyspnea: Secondary | ICD-10-CM

## 2013-02-12 LAB — BASIC METABOLIC PANEL
CO2: 33 mEq/L — ABNORMAL HIGH (ref 19–32)
Chloride: 103 mEq/L (ref 96–112)
Sodium: 143 mEq/L (ref 135–145)

## 2013-02-12 NOTE — Progress Notes (Signed)
OFFICE NOTE  Chief Complaint:  Routine followup  Primary Care Physician: Thayer Headings, MD  HPI:  Angelica [name] is a pleasant 79 year old female with permanent atrial fibrillation, AV malformation and diastolic heart failure. She has also had issues with bleeding and has been followed by Dr. Cyndie Chime who manages her warfarin levels. She was diagnosed with COPD at some point; however, this has been questioned by her pulmonologist, who is Dr. Delton Coombes. He has tried her on and off of inhalers with not much improvement in her shortness of breath. I feel some of her symptoms may be due to diastolic heart failure; however, I feel that is fairly well controlled. She has had no significant weight gain. Her daughter generally manages her blood pressure medications and does most of the speaking for her. She reports that she feels that Angelica [name] has been gaining some weight or at least looks like she has some excess fluid. Based on that she has been giving her 60 mg of Lasix instead of 40. In addition she has been giving her prednisone 10 mg every other day. She is wondering whether she should give her more prednisone or more Lasix in order to treat her, as she feels that she is recently had more shortness of breath and cough. According to Angelica [name], she is not having any significant problems.  PMHx:  Past Medical History  Diagnosis Date  . Atrial fibrillation   . Hypertension   . Arthritis   . GERD (gastroesophageal reflux disease)   . COPD (chronic obstructive pulmonary disease)   . PNA (pneumonia)   . Anemia due to GI blood loss 07/11/2011  . 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 Stroke 2009    aphasia and memory impairment as residual  . Hypoxia   . Atrial fibrillation, permanent, rate controlled on coumadin 05/10/2011  . Hypogammaglobulinemia 06/01/2012    Immunoglobulins 05/01/2012-normal IgG  M., normal IgA,  low IgG [phone]).   . 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 CVA history 05/10/2011  . CHF (congestive heart failure) 06/14/2011    diastolic  . History of nuclear stress test 2009    normal dobutamine myoview    Past Surgical History  Procedure Laterality Date  . Hip fracture surgery    . Cholecystectomy    . Knee surgery    . Cataract extraction    . Transthoracic echocardiogram  06/2011    EF 55-60%; mild LVH; LA & RA mod dilated; mod TR    FAMHx:  Family History  Problem Relation Age of Onset  . Heart disease Mother   . Rectal cancer Mother   . Leukemia Father   . Heart failure Mother   . Stroke Mother   . Cancer Maternal Grandmother   . Hypertension Son   . Hypertension Daughter     SOCHx:   reports that she has never smoked. She has never used smokeless tobacco. She reports that she drinks about 4.2 ounces of alcohol per week. She reports that she does not use illicit drugs.  ALLERGIES:  Allergies  Allergen Reactions  . Amoxicillin Hives  . Other     Ragweed--wheezing, congestion, etc  . Penicillins Hives    ROS: A comprehensive review of systems was negative except for: Respiratory: positive for cough and dyspnea on exertion Cardiovascular: positive for dyspnea  HOME MEDS: Current Outpatient Prescriptions  Medication Sig Dispense Refill  . acetaminophen (TYLENOL) 500 MG tablet Take 1,000  mg by mouth every 6 (six) hours as needed.       Marland Kitchen albuterol (PROVENTIL) (2.5 MG/3ML) 0.083% nebulizer solution Take 2.5 mg by nebulization 4 (four) times daily. Takes 3 or 4 times per day      . alendronate (FOSAMAX) 70 MG tablet Take 70 mg by mouth every 7 (seven) days. Take with a full glass of water on an empty stomach. On tuesdays      . azelastine (ASTELIN) 137 MCG/SPRAY nasal spray PLACE 2 SPRAYS INTO THE NOSE 2 (TWO) TIMES DAILY. USE IN EACH NOSTRIL AS DIRECTED  30 mL  2  . budesonide (PULMICORT) 0.5 MG/2ML nebulizer solution Take 0.5 mg by  nebulization 2 (two) times daily.      . Cholecalciferol (VITAMIN D) 2000 UNITS CAPS Take 1 capsule by mouth daily.       Marland Kitchen diltiazem (DILACOR XR) 180 MG 24 hr capsule Take 180 mg by mouth daily before breakfast.       . ferrous sulfate 325 (65 FE) MG tablet Take 65 mg by mouth. 2 tabs daily      . fluticasone (FLONASE) 50 MCG/ACT nasal spray Place 2 sprays into the nose daily.      . furosemide (LASIX) 40 MG tablet Take 40 mg by mouth 2 (two) times daily. 40mg  daily, and extra if needed      . guaiFENesin (MUCINEX) 600 MG 12 hr tablet Take 600 mg by mouth 2 (two) times daily as needed for congestion.      Marland Kitchen loratadine (CLARITIN) 10 MG tablet Take 10 mg by mouth daily.      . Multiple Vitamins-Minerals (MULTIVITAMINS THER. W/MINERALS) TABS Take 1 tablet by mouth every evening.       . pantoprazole (PROTONIX) 40 MG tablet Take 40 mg by mouth daily.      . polyethylene glycol (MIRALAX / GLYCOLAX) packet Take 17 g by mouth. 1/2 capful 2-3 times week      . predniSONE (DELTASONE) 5 MG tablet Take 10 mg by mouth every other day.       . simvastatin (ZOCOR) 10 MG tablet Take 10 mg by mouth at bedtime.      Marland Kitchen warfarin (COUMADIN) 6 MG tablet Take 6 mg by mouth every morning.        No current facility-administered medications for this visit.    LABS/IMAGING: No results found for this or any previous visit (from the past 48 hour(s)). No results found.  VITALS: BP 134/70  Pulse 67  Ht 5\' 4"  (1.626 m)  Wt 153 lb 9.6 oz (69.673 kg)  BMI 26.35 kg/m2  SpO2 93%  EXAM: General appearance: alert and no distress Neck: no adenopathy, no carotid bruit, no JVD, supple, symmetrical, trachea midline and thyroid not enlarged, symmetric, no tenderness/mass/nodules Lungs: Decreased breath sounds globally, no rhonchi or wheezes, no rales  Heart: irregularly regular rhythm, S1, S2 normal, no murmur, click, rub or gallop Abdomen: soft, non-tender; bowel sounds normal; no masses,  no organomegaly Extremities:  extremities normal, atraumatic, no cyanosis or edema Pulses: 2+ and symmetric Skin: Skin color, texture, turgor normal. No rashes or lesions Neurologic: Grossly normal  EKG: A-fib at 67  ASSESSMENT: 1. Permanent atrial fibrillation 2. Chronic diastolic heart failure 3. COPD 4. Anemia with AVMs  PLAN: 1.   Angelica [name] may be having acute on chronic heart failure exacerbation. We'll plan to go ahead and check a BMP and BNP. I recommended increasing her Lasix to 40 mg twice daily.  She may need increase in her steroids and/or antibiotics if she does not respond, however she seems to not be having a COPD exacerbation. Her lungs are not rhonchorous, but there are decreased breath sounds and poor air movement. We'll plan to see her back in a week to decide whether or not she is improving with Lasix or whether she may need to be admitted for IV diuretics.  Chrystie Nose, MD, [facility] Attending Cardiologist [facility]  HILTY,Kenneth C 02/12/2013, 3:57 PM

## 2013-02-12 NOTE — Patient Instructions (Addendum)
Your physician has recommended you make the following change in your medication: Take Furosemide (Lasix) 40mg  twice daily  Your physician recommends that you return for lab work today. BNP, BMET  Your physician recommends that you schedule a follow-up appointment in: 1 week.

## 2013-02-13 LAB — BRAIN NATRIURETIC PEPTIDE: Brain Natriuretic Peptide: 340.1 pg/mL — ABNORMAL HIGH (ref 0.0–100.0)

## 2013-02-14 ENCOUNTER — Ambulatory Visit: Payer: Medicare Other | Admitting: Physical Therapy

## 2013-02-18 ENCOUNTER — Ambulatory Visit: Payer: Medicare Other | Admitting: Physical Therapy

## 2013-02-20 ENCOUNTER — Ambulatory Visit: Payer: Medicare Other | Admitting: Physical Therapy

## 2013-02-21 ENCOUNTER — Ambulatory Visit: Payer: Medicare Other | Admitting: Physical Therapy

## 2013-02-25 ENCOUNTER — Ambulatory Visit: Payer: Medicare Other | Admitting: Physical Therapy

## 2013-02-26 ENCOUNTER — Ambulatory Visit: Payer: Medicare Other | Admitting: Physical Therapy

## 2013-02-28 ENCOUNTER — Ambulatory Visit: Payer: Medicare Other | Admitting: Physical Therapy

## 2013-03-05 ENCOUNTER — Ambulatory Visit: Payer: Medicare Other | Attending: Orthopedic Surgery | Admitting: Physical Therapy

## 2013-03-05 DIAGNOSIS — W19XXXA Unspecified fall, initial encounter: Secondary | ICD-10-CM | POA: Insufficient documentation

## 2013-03-05 DIAGNOSIS — IMO0001 Reserved for inherently not codable concepts without codable children: Secondary | ICD-10-CM | POA: Insufficient documentation

## 2013-03-05 DIAGNOSIS — M25519 Pain in unspecified shoulder: Secondary | ICD-10-CM | POA: Insufficient documentation

## 2013-03-05 DIAGNOSIS — M25619 Stiffness of unspecified shoulder, not elsewhere classified: Secondary | ICD-10-CM | POA: Insufficient documentation

## 2013-03-08 ENCOUNTER — Ambulatory Visit: Payer: Medicare Other | Admitting: Physical Therapy

## 2013-03-22 ENCOUNTER — Ambulatory Visit: Payer: Medicare Other | Admitting: Physical Therapy

## 2013-04-02 ENCOUNTER — Other Ambulatory Visit (HOSPITAL_BASED_OUTPATIENT_CLINIC_OR_DEPARTMENT_OTHER): Payer: Medicare Other | Admitting: Lab

## 2013-04-02 DIAGNOSIS — D500 Iron deficiency anemia secondary to blood loss (chronic): Secondary | ICD-10-CM

## 2013-04-02 DIAGNOSIS — I4891 Unspecified atrial fibrillation: Secondary | ICD-10-CM

## 2013-04-02 LAB — PROTIME-INR (CHCC SATELLITE)

## 2013-04-02 LAB — CBC WITH DIFFERENTIAL (CANCER CENTER ONLY)
BASO#: 0 10*3/uL (ref 0.0–0.2)
BASO%: 0.2 % (ref 0.0–2.0)
HCT: 32.9 % — ABNORMAL LOW (ref 34.8–46.6)
HGB: 9.9 g/dL — ABNORMAL LOW (ref 11.6–15.9)
LYMPH#: 1.1 10*3/uL (ref 0.9–3.3)
LYMPH%: 12 % — ABNORMAL LOW (ref 14.0–48.0)
MONO#: 1 10*3/uL — ABNORMAL HIGH (ref 0.1–0.9)
NEUT%: 72.9 % (ref 39.6–80.0)
RBC: 3.7 10*6/uL (ref 3.70–5.32)
RDW: 17.5 % — ABNORMAL HIGH (ref 11.1–15.7)
WBC: 8.8 10*3/uL (ref 3.9–10.0)

## 2013-04-02 LAB — FERRITIN CHCC: Ferritin: 127 ng/ml (ref 9–269)

## 2013-04-08 ENCOUNTER — Ambulatory Visit: Payer: Medicare Other | Admitting: Pharmacist

## 2013-04-08 DIAGNOSIS — I4821 Permanent atrial fibrillation: Secondary | ICD-10-CM

## 2013-04-08 DIAGNOSIS — I639 Cerebral infarction, unspecified: Secondary | ICD-10-CM

## 2013-04-08 NOTE — Progress Notes (Signed)
INR = 1.8 (drawn in [facility] on 04/02/13) Pt is taking Coumadin 6 mg daily No change to Coumadin dose. I left a voicemail for [employer] today. Pt already scheduled for lab in [facility] on 05/01/13 at 10:30 am. NO CHARGE- Phone Encounter Ebony Hail, Pharm.D., CPP 04/08/2013@2 :02 PM

## 2013-04-09 ENCOUNTER — Telehealth: Payer: Self-pay | Admitting: Pharmacist

## 2013-04-09 NOTE — Telephone Encounter (Signed)
Linda called [facility] Coumadin clinic & requested lab appt in [place] for her mother on "Tues or Wed in [place] at 10:30 am"   I called Bonita Quin yesterday w/ her mother's INR result from 04/02/13 & she is already sched for 05/01/13 lab in [place] at 10:30 am. I asked Bonita Quin to call us back if her mother needs additional lab draw before 05/01/13. Ebony Hail, Pharm.D., CPP 04/09/2013@2 :42 PM

## 2013-04-30 ENCOUNTER — Other Ambulatory Visit: Payer: Self-pay | Admitting: Oncology

## 2013-04-30 ENCOUNTER — Other Ambulatory Visit: Payer: Self-pay | Admitting: *Deleted

## 2013-04-30 ENCOUNTER — Other Ambulatory Visit: Payer: Self-pay | Admitting: Emergency Medicine

## 2013-04-30 DIAGNOSIS — I4821 Permanent atrial fibrillation: Secondary | ICD-10-CM

## 2013-04-30 MED ORDER — WARFARIN SODIUM 4 MG PO TABS: 6.0000 mg | ORAL_TABLET | Freq: Every day | ORAL | Status: DC

## 2013-05-01 ENCOUNTER — Other Ambulatory Visit (HOSPITAL_BASED_OUTPATIENT_CLINIC_OR_DEPARTMENT_OTHER): Payer: Medicare Other | Admitting: Lab

## 2013-05-01 ENCOUNTER — Ambulatory Visit: Payer: Self-pay | Admitting: Pharmacist

## 2013-05-01 DIAGNOSIS — I4891 Unspecified atrial fibrillation: Secondary | ICD-10-CM

## 2013-05-01 DIAGNOSIS — I639 Cerebral infarction, unspecified: Secondary | ICD-10-CM

## 2013-05-01 DIAGNOSIS — I4821 Permanent atrial fibrillation: Secondary | ICD-10-CM

## 2013-05-01 LAB — PROTIME-INR (CHCC SATELLITE)
INR: 3.2 (ref 2.0–3.5)
Protime: 38.4 s — ABNORMAL HIGH (ref 10.6–13.4)

## 2013-05-01 LAB — POCT INR: INR: 3.2

## 2013-05-01 NOTE — Patient Instructions (Signed)
INR elevated Hold coumadin tonight, take 4 mg dose tomorrow then Continue Coumadin 6mg  daily.  Recheck INR on 05/07/13 at [facility]; Lab at 10:30 am. Angelica [name] aware of date and time.

## 2013-05-01 NOTE — Progress Notes (Signed)
No charge - Emergency planning/management officer. INR above goal Spoke to Daughter, Angelica [name] on the phone She reports no missed or extra doses No bleeding at this time Recent antibiotic use but this was 3 weeks ago and she does not remember what the antibiotic was Plan: Hold coumadin tonight, take 4 mg dose tomorrow then Continue Coumadin 6mg  daily.  Recheck INR on 05/07/13 at [facility]; Lab at 10:30 am. Angelica [name] aware of date and time.

## 2013-05-07 ENCOUNTER — Telehealth: Payer: Self-pay | Admitting: Hematology & Oncology

## 2013-05-07 ENCOUNTER — Other Ambulatory Visit: Payer: Medicare Other | Admitting: Lab

## 2013-05-07 ENCOUNTER — Telehealth: Payer: Self-pay | Admitting: Pharmacist

## 2013-05-07 NOTE — Telephone Encounter (Signed)
Eileen Stanford our pt lab tech called to reschedule another lab for this pt, due to she missed the one for today.

## 2013-05-09 ENCOUNTER — Other Ambulatory Visit: Payer: Self-pay

## 2013-05-14 ENCOUNTER — Ambulatory Visit: Payer: Medicare Other | Admitting: Pharmacist

## 2013-05-14 ENCOUNTER — Ambulatory Visit (HOSPITAL_BASED_OUTPATIENT_CLINIC_OR_DEPARTMENT_OTHER): Payer: Medicare Other | Admitting: Lab

## 2013-05-14 DIAGNOSIS — D500 Iron deficiency anemia secondary to blood loss (chronic): Secondary | ICD-10-CM

## 2013-05-14 DIAGNOSIS — I4891 Unspecified atrial fibrillation: Secondary | ICD-10-CM

## 2013-05-14 LAB — PROTIME-INR (CHCC SATELLITE): Protime: 26.4 Seconds — ABNORMAL HIGH (ref 10.6–13.4)

## 2013-05-14 LAB — CBC WITH DIFFERENTIAL (CANCER CENTER ONLY)
BASO#: 0 10*3/uL (ref 0.0–0.2)
BASO%: 0.1 % (ref 0.0–2.0)
EOS%: 5.8 % (ref 0.0–7.0)
HCT: 32.6 % — ABNORMAL LOW (ref 34.8–46.6)
HGB: 9.7 g/dL — ABNORMAL LOW (ref 11.6–15.9)
LYMPH%: 7.8 % — ABNORMAL LOW (ref 14.0–48.0)
MCH: 26.3 pg (ref 26.0–34.0)
MCHC: 29.8 g/dL — ABNORMAL LOW (ref 32.0–36.0)
MCV: 88 fL (ref 81–101)
NEUT#: 6.5 10*3/uL (ref 1.5–6.5)
NEUT%: 78.5 % (ref 39.6–80.0)
RDW: 16 % — ABNORMAL HIGH (ref 11.1–15.7)

## 2013-05-14 NOTE — Progress Notes (Signed)
Spoke with patient's daughter Bonita Quin today via telephone.  Patient's INR therapeutic at 2.2.  She should continue her current Coumadin dose of 6 mg daily.  Recheck INR on 06/11/13 at [facility]; lab at 10:00 am.  This is a no charge telephone encounter.  Lillia Pauls, PharmD Clinical Pharmacist 05/14/2013 4:57 PM

## 2013-05-14 NOTE — Patient Instructions (Signed)
Continue Coumadin 6mg  daily.  Recheck INR on 06/11/13 at [facility]; lab at 10:00 am

## 2013-05-15 ENCOUNTER — Telehealth: Payer: Self-pay | Admitting: Pharmacist

## 2013-06-11 ENCOUNTER — Other Ambulatory Visit (HOSPITAL_BASED_OUTPATIENT_CLINIC_OR_DEPARTMENT_OTHER): Payer: Medicare Other | Admitting: Lab

## 2013-06-11 ENCOUNTER — Ambulatory Visit: Payer: Medicare Other | Admitting: Pharmacist

## 2013-06-11 DIAGNOSIS — D500 Iron deficiency anemia secondary to blood loss (chronic): Secondary | ICD-10-CM

## 2013-06-11 DIAGNOSIS — I639 Cerebral infarction, unspecified: Secondary | ICD-10-CM

## 2013-06-11 DIAGNOSIS — I4821 Permanent atrial fibrillation: Secondary | ICD-10-CM

## 2013-06-11 DIAGNOSIS — I4891 Unspecified atrial fibrillation: Secondary | ICD-10-CM

## 2013-06-11 LAB — IRON AND TIBC CHCC
%SAT: 21 % (ref 21–57)
Iron: 53 ug/dL (ref 41–142)
TIBC: 251 ug/dL (ref 236–444)
UIBC: 198 ug/dL (ref 120–384)

## 2013-06-11 LAB — POCT INR: INR: 2.1

## 2013-06-11 NOTE — Progress Notes (Signed)
**  Phone Encounter No Charge** Called and left a voicemail with Daughter, [name]. Left instructions on the message to continue same dose and to call back with any questions or concerns or issues such as bleeding/bruising. If pt has no issues then we will plan on checking INR on 06/25/13  INR at goal and stable No changes Continue Coumadin 6mg  daily.  Recheck INR on 06/25/13 at [facility]; lab at 10:00 am.  We will call pt with the results

## 2013-06-11 NOTE — Patient Instructions (Signed)
INR at goal No changes Continue Coumadin 6mg  daily.  Recheck INR on 06/25/13 at [facility]; lab at 10:00 am.  We will call you with the results

## 2013-06-19 ENCOUNTER — Telehealth: Payer: Self-pay | Admitting: *Deleted

## 2013-06-19 NOTE — Telephone Encounter (Signed)
Spoke with dtr. Linda at home number.  Let her know that patients iron studies are currently normal.  She appreciated the call.

## 2013-06-24 ENCOUNTER — Telehealth: Payer: Self-pay | Admitting: Pharmacist

## 2013-06-25 ENCOUNTER — Other Ambulatory Visit: Payer: Medicare Other | Admitting: Lab

## 2013-07-16 ENCOUNTER — Ambulatory Visit (HOSPITAL_BASED_OUTPATIENT_CLINIC_OR_DEPARTMENT_OTHER): Payer: Medicare Other | Admitting: Oncology

## 2013-07-16 ENCOUNTER — Ambulatory Visit: Payer: Medicare Other | Admitting: Pharmacist

## 2013-07-16 ENCOUNTER — Other Ambulatory Visit (HOSPITAL_BASED_OUTPATIENT_CLINIC_OR_DEPARTMENT_OTHER): Payer: Medicare Other

## 2013-07-16 ENCOUNTER — Other Ambulatory Visit: Payer: Medicare Other | Admitting: Lab

## 2013-07-16 VITALS — BP 116/54 | HR 82 | Temp 97.0°F | Resp 18 | Ht 64.0 in | Wt 152.5 lb

## 2013-07-16 DIAGNOSIS — I509 Heart failure, unspecified: Secondary | ICD-10-CM

## 2013-07-16 DIAGNOSIS — F0390 Unspecified dementia without behavioral disturbance: Secondary | ICD-10-CM

## 2013-07-16 DIAGNOSIS — I639 Cerebral infarction, unspecified: Secondary | ICD-10-CM

## 2013-07-16 DIAGNOSIS — D500 Iron deficiency anemia secondary to blood loss (chronic): Secondary | ICD-10-CM

## 2013-07-16 DIAGNOSIS — Q2733 Arteriovenous malformation of digestive system vessel: Secondary | ICD-10-CM

## 2013-07-16 DIAGNOSIS — K5520 Angiodysplasia of colon without hemorrhage: Secondary | ICD-10-CM

## 2013-07-16 DIAGNOSIS — I4821 Permanent atrial fibrillation: Secondary | ICD-10-CM

## 2013-07-16 DIAGNOSIS — I4891 Unspecified atrial fibrillation: Secondary | ICD-10-CM

## 2013-07-16 LAB — PROTIME-INR
INR: 2 (ref 2.00–3.50)
PROTIME: 24 s — AB (ref 10.6–13.4)

## 2013-07-16 LAB — CBC WITH DIFFERENTIAL/PLATELET
BASO%: 1.2 % (ref 0.0–2.0)
Basophils Absolute: 0.1 10*3/uL (ref 0.0–0.1)
EOS ABS: 0.4 10*3/uL (ref 0.0–0.5)
EOS%: 3.9 % (ref 0.0–7.0)
HCT: 33.6 % — ABNORMAL LOW (ref 34.8–46.6)
HGB: 10.5 g/dL — ABNORMAL LOW (ref 11.6–15.9)
LYMPH%: 11.3 % — ABNORMAL LOW (ref 14.0–49.7)
MCH: 26.9 pg (ref 25.1–34.0)
MCHC: 31.4 g/dL — ABNORMAL LOW (ref 31.5–36.0)
MCV: 85.6 fL (ref 79.5–101.0)
MONO#: 0.7 10*3/uL (ref 0.1–0.9)
MONO%: 7.8 % (ref 0.0–14.0)
NEUT#: 6.9 10*3/uL — ABNORMAL HIGH (ref 1.5–6.5)
NEUT%: 75.8 % (ref 38.4–76.8)
PLATELETS: 382 10*3/uL (ref 145–400)
RBC: 3.92 10*6/uL (ref 3.70–5.45)
RDW: 17.3 % — AB (ref 11.2–14.5)
WBC: 9.2 10*3/uL (ref 3.9–10.3)
lymph#: 1 10*3/uL (ref 0.9–3.3)

## 2013-07-16 LAB — POCT INR: INR: 2

## 2013-07-16 LAB — HOLD TUBE, BLOOD BANK

## 2013-07-16 NOTE — Progress Notes (Signed)
INR remains at goal on coumadin 6mg  daily.  Angelica [name] took a zpak over [place] and seems to be feeling better.  No other changes in medications.  No bleeding or bruising.  Angelica [name] has an appt with Dr Beryle Beams today.  Will continue coumadin 6mg  daily and check PT/INR in 1 month in [place].

## 2013-07-16 NOTE — Progress Notes (Signed)
Hematology and Oncology Follow Up Visit  Angelica [name] [id number] [birth year] 80 y.o. 07/16/2013 11:57 AM   Principle Diagnosis: Encounter Diagnoses  Name Primary?  Marland Kitchen Anemia due to GI blood loss Yes  . Iron deficiency anemia secondary to blood loss (chronic)   . Arteriovenous malformation of gastrointestinal tract      Interim History:   Follow-up visit for this 80 year old woman with chronic GI blood loss due to AVMs. She is on full dose Coumadin anticoagulation due to a previous stroke. I felt aspirin was contraindicated in view of her AVMs and GI bleeding. Her Coumadin is monitored through our office.  Blood counts are now monitored in our office every 3 months. She requires periodic parenteral iron infusions. Most recent infusion given on 12/20/2012. She has had a number of medical issues over the last 2 years. She was hospitalized one year ago in December 2012 with pneumonia and congestive heart failure. She was hospitalized in April 2013 with viral gastroenteritis/diarrhea. Hemoglobin dipped from her baseline of 11 g at that time and she was given parenteral iron. She was admitted in April 2014 with dyspnea and left flank pain. Evaluation revealed she was in heart failure. She had a subsequent fall and fracture of her right arm. She has chronic rhinitis and uses a Proventil nebulizer and Astelin nasal spray as well as Pulmicort nebulizers. She is on chronic low-dose prednisone 5 mg daily.  She has chronic atrial fibrillation on Dilacor XR.  Overall she has been medically stable for the last few months. She has not noted any hematochezia or melena. No abdominal pain.   Medications: reviewed  Allergies:  Allergies  Allergen Reactions  . Amoxicillin Hives  . Other     Ragweed--wheezing, congestion, etc  . Penicillins Hives    Review of Systems: Hematology:  No bleeding or bruising ENT ROS: No sore throat Breast ROS:  Respiratory ROS: No cough or dyspnea Cardiovascular  ROS:   No chest pain or palpitations Gastrointestinal ROS:   No abdominal pain. No hematochezia or melena Genito-Urinary ROS: Not questioned Musculoskeletal ROS: Chronic arthritis pain Neurological ROS: No headache or change in vision. Poor short term memory. Dermatological ROS: No rash Remaining ROS negative.  Physical Exam: Blood pressure 116/54, pulse 82, temperature 97 F (36.1 C), temperature source Oral, resp. rate 18, height 5\' 4"  (1.626 m), weight 152 lb 8 oz (69.174 kg), SpO2 96.00%. Wt Readings from Last 3 Encounters:  07/16/13 152 lb 8 oz (69.174 kg)  02/12/13 153 lb 9.6 oz (69.673 kg)  01/08/13 153 lb 12.8 oz (69.763 kg)     General appearance: Well nourished elderly Caucasian woman HENNT: Pharynx no erythema, exudate, mass, or ulcer. No thyromegaly or thyroid nodules Lymph nodes: No cervical, supraclavicular, or axillary lymphadenopathy Breasts: Lungs: Clear to auscultation, resonant to percussion throughout Heart: Regular rhythm, no murmur, no gallop, no rub, no click, no edema Abdomen: Soft, nontender, normal bowel sounds, no mass, no organomegaly Extremities: No edema, no calf tenderness Musculoskeletal: Osteoarthritic changes of the hand joints GU:  Vascular: Carotid pulses 2+, no bruits, Neurologic: Alert, oriented, PERRLA,  , cranial nerves grossly normal, motor strength 5 over 5, reflexes 1+ symmetric, upper body coordination normal, gait unsteady, Skin: No rash or ecchymosis  Lab Results: CBC W/Diff    Component Value Date/Time   WBC 9.2 07/16/2013 1040   WBC 8.3 05/14/2013 1153   WBC 7.3 10/10/2012 0510   RBC 3.92 07/16/2013 1040   RBC 4.07 10/10/2012 0510   RBC 3.83*  09/14/2009 1218   HGB 10.5* 07/16/2013 1040   HGB 9.7* 05/14/2013 1153   HGB 9.0* 10/10/2012 0510   HCT 33.6* 07/16/2013 1040   HCT 32.6* 05/14/2013 1153   HCT 30.8* 10/10/2012 0510   PLT 382 07/16/2013 1040   PLT 374 05/14/2013 1153   PLT 406* 10/10/2012 0510   MCV 85.6 07/16/2013 1040   MCV 88  05/14/2013 1153   MCV 75.7* 10/10/2012 0510   MCH 26.9 07/16/2013 1040   MCH 26.3 05/14/2013 1153   MCH 22.1* 10/10/2012 0510   MCHC 31.4* 07/16/2013 1040   MCHC 29.8* 05/14/2013 1153   MCHC 29.2* 10/10/2012 0510   RDW 17.3* 07/16/2013 1040   RDW 16.0* 05/14/2013 1153   RDW 16.7* 10/10/2012 0510   LYMPHSABS 1.0 07/16/2013 1040   LYMPHSABS 0.6* 05/14/2013 1153   LYMPHSABS 0.5* 10/06/2012 1356   MONOABS 0.7 07/16/2013 1040   MONOABS 0.2 10/06/2012 1356   EOSABS 0.4 07/16/2013 1040   EOSABS 0.5 05/14/2013 1153   EOSABS 0.2 10/06/2012 1356   BASOSABS 0.1 07/16/2013 1040   BASOSABS 0.0 05/14/2013 1153   BASOSABS 0.1 10/06/2012 1356     Chemistry      Component Value Date/Time   NA 143 02/12/2013 1211   K 3.8 02/12/2013 1211   CL 103 02/12/2013 1211   CO2 33* 02/12/2013 1211   BUN 12 02/12/2013 1211   CREATININE 0.98 02/12/2013 1211   CREATININE 0.94 11/14/2012 1705      Component Value Date/Time   CALCIUM 9.8 02/12/2013 1211   ALKPHOS 87 10/06/2012 1356   AST 13 10/06/2012 1356   ALT 12 10/06/2012 1356   BILITOT 0.2* 10/06/2012 1356    Iron studies done on 06/11/2013 serum iron 53, ferritin 142    Impression:  1. Intermittent GI blood loss due to AVMs currently stable with no active bleeding.  We will continue to monitor her CBC and ferritin levels every 3 months. When necessary parenteral iron infusions.  2. Chronic Coumadin anticoagulation status post stroke sustained in June of 2009 with risk factor being chronic atrial fibrillation. She was on aspirin when she had the stroke. Plavix added to the regimen. I felt Coumadin would be safer for her, and she has had no subsequent neurologic events or bleeding since being on the Coumadin started back in July of 2009.   3. Chronic atrial fibrillation.   4. Adult onset asthma.   5. Chronic congestive heart failure  6. Mild senile dementia  I will transition her care to Dr. Alvy Bimler at time of her next visit in 6 months.    CC: Patient Care Team: [name], MD as PCP - General (Internal Medicine) Pixie Casino, MD (Cardiology)   Annia Belt, MD 1/13/201511:57 AM

## 2013-07-17 ENCOUNTER — Telehealth: Payer: Self-pay | Admitting: Hematology and Oncology

## 2013-07-17 NOTE — Telephone Encounter (Signed)
sw. pt daughter and advised on July appt...ok and aware

## 2013-07-19 NOTE — Telephone Encounter (Signed)
Patient was a no-show for Coumadin Clinic this morning. Left a message on his vm asking him to call and reschedule.

## 2013-07-19 NOTE — Telephone Encounter (Signed)
Patient's daughter called to cancel 06/25/13 lab, reschedule for 07/16/13 concurrent with appt with Dr. Beryle Beams.

## 2013-07-19 NOTE — Telephone Encounter (Signed)
Left VM on daughter"s Gardiner Barefoot) confirming patient's lab appointment scheduled 06/11/13 at 10:15 in [facility].

## 2013-08-06 ENCOUNTER — Encounter: Payer: Self-pay | Admitting: Internal Medicine

## 2013-08-06 ENCOUNTER — Ambulatory Visit (INDEPENDENT_AMBULATORY_CARE_PROVIDER_SITE_OTHER): Payer: Medicare Other | Admitting: Internal Medicine

## 2013-08-06 VITALS — BP 108/72 | HR 73 | Ht 64.0 in | Wt 145.5 lb

## 2013-08-06 DIAGNOSIS — I509 Heart failure, unspecified: Secondary | ICD-10-CM

## 2013-08-06 DIAGNOSIS — I4821 Permanent atrial fibrillation: Secondary | ICD-10-CM

## 2013-08-06 DIAGNOSIS — I5033 Acute on chronic diastolic (congestive) heart failure: Secondary | ICD-10-CM

## 2013-08-06 DIAGNOSIS — I4891 Unspecified atrial fibrillation: Secondary | ICD-10-CM

## 2013-08-06 NOTE — Progress Notes (Signed)
OFFICE NOTE  Chief Complaint:  Routine followup  Primary Care Physician: [name], MD  HPI:  Angelica [name] is a pleasant 80 year old female with permanent atrial fibrillation, AV malformation and diastolic heart failure. She has also had issues with bleeding and has been followed by Dr. Beryle Beams who manages her warfarin levels. She was diagnosed with COPD at some point; however, this has been questioned by her pulmonologist, who is Dr. Lamonte Sakai. He has tried her on and off of inhalers with not much improvement in her shortness of breath. I feel some of her symptoms may be due to diastolic heart failure; however, I feel that is fairly well controlled. She has had no significant weight gain. Her daughter generally manages her blood pressure medications and does most of the speaking for her.  At her last office visit there was some concern that she may be gaining a little bit of fluid and having some lower strandy swelling. I recommended increasing her Lasix to 40 mg twice daily for a week, but her daughter felt that that was not necessary. Instead she slowly titrated her medication to a current dose of 70 mg Lasix daily. I thought this might be an error, however she tells me that she has actually been giving her 1-3/4 tablets of Lasix daily.  I did not advise this dosing, however she (Angelica [name]'s daughter) feels more comfortable with this dose.  Her weight has been fairly stable.  She recently had a recurrent upper respiratory infection and was treated with a Z-Pak. She still feels quite fatigued.  PMHx:  Past Medical History  Diagnosis Date  . Atrial fibrillation   . Hypertension   . Arthritis   . GERD (gastroesophageal reflux disease)   . COPD (chronic obstructive pulmonary disease)   . PNA (pneumonia)   . Anemia due to GI blood loss 07/11/2011  . 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 Arteriovenous malformation of gastrointestinal tract 07/11/2011    . Stroke 2009    aphasia and memory impairment as residual  . Hypoxia   . Atrial fibrillation, permanent, rate controlled on coumadin 05/10/2011  . Hypogammaglobulinemia 06/01/2012    Immunoglobulins 05/01/2012-normal IgG M., normal IgA,  low IgG [phone]).   . 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 CVA history 05/10/2011  . CHF (congestive heart failure) 19/37/9024    diastolic  . History of nuclear stress test 2009    normal dobutamine myoview    Past Surgical History  Procedure Laterality Date  . Hip fracture surgery    . Cholecystectomy    . Knee surgery    . Cataract extraction    . Transthoracic echocardiogram  06/2011    EF 55-60%; mild LVH; LA & RA mod dilated; mod TR    FAMHx:  Family History  Problem Relation Age of Onset  . Heart disease Mother   . Rectal cancer Mother   . Leukemia Father   . Heart failure Mother   . Stroke Mother   . Cancer Maternal Grandmother   . Hypertension Son   . Hypertension Daughter     SOCHx:   reports that she has never smoked. She has never used smokeless tobacco. She reports that she drinks about 4.2 ounces of alcohol per week. She reports that she does not use illicit drugs.  ALLERGIES:  Allergies  Allergen Reactions  . Amoxicillin Hives  . Other     Ragweed--wheezing, congestion, etc  . Penicillins Hives    ROS: A comprehensive  review of systems was negative except for: Respiratory: positive for cough and dyspnea on exertion Cardiovascular: positive for dyspnea and lower extremity edema  HOME MEDS: Current Outpatient Prescriptions  Medication Sig Dispense Refill  . acetaminophen (TYLENOL) 500 MG tablet Take 1,000 mg by mouth every 6 (six) hours as needed.       Marland Kitchen albuterol (PROVENTIL) (2.5 MG/3ML) 0.083% nebulizer solution Take 2.5 mg by nebulization 4 (four) times daily. Takes 3 or 4 times per day      . alendronate (FOSAMAX) 70 MG tablet Take 70 mg by mouth every 7 (seven) days. Take with a full glass of water on an  empty stomach. On tuesdays      . ALLERGY 10 MG tablet TAKE 1 TABLET BY MOUTH EVERY EVENING.  30 tablet  2  . azelastine (ASTELIN) 137 MCG/SPRAY nasal spray PLACE 2 SPRAYS INTO THE NOSE 2 (TWO) TIMES DAILY. USE IN EACH NOSTRIL AS DIRECTED  30 mL  2  . budesonide (PULMICORT) 0.5 MG/2ML nebulizer solution Take 0.5 mg by nebulization 2 (two) times daily.      . Cholecalciferol (VITAMIN D) 2000 UNITS CAPS Take 1 capsule by mouth daily.       Marland Kitchen diltiazem (DILACOR XR) 180 MG 24 hr capsule Take 180 mg by mouth daily before breakfast.       . ferrous sulfate 325 (65 FE) MG tablet Take 65 mg by mouth. 2 tabs daily      . fluticasone (FLONASE) 50 MCG/ACT nasal spray Place 2 sprays into the nose daily.      . furosemide (LASIX) 40 MG tablet Take 70 mg by mouth daily. 40mg  daily, and extra if needed      . guaiFENesin (MUCINEX) 600 MG 12 hr tablet Take 600 mg by mouth 2 (two) times daily as needed for congestion.      . Multiple Vitamins-Minerals (MULTIVITAMINS THER. W/MINERALS) TABS Take 1 tablet by mouth every evening.       . pantoprazole (PROTONIX) 40 MG tablet Take 40 mg by mouth daily.      . polyethylene glycol (MIRALAX / GLYCOLAX) packet Take 17 g by mouth daily as needed. 1/2 capful 2-3 times week      . predniSONE (DELTASONE) 5 MG tablet Take 10 mg by mouth every other day.       . simvastatin (ZOCOR) 10 MG tablet Take 10 mg by mouth at bedtime.      Marland Kitchen warfarin (COUMADIN) 4 MG tablet Take 1.5 tablets (6 mg total) by mouth daily. Take 6mg  daily or as directed by physician  160 tablet  3   No current facility-administered medications for this visit.    LABS/IMAGING: No results found for this or any previous visit (from the past 48 hour(s)). No results found.  VITALS: BP 108/72  Pulse 73  Ht 5\' 4"  (1.626 m)  Wt 145 lb 8 oz (65.998 kg)  BMI 24.96 kg/m2  EXAM: General appearance: alert and no distress Neck: no adenopathy, no carotid bruit, no JVD, supple, symmetrical, trachea midline and  thyroid not enlarged, symmetric, no tenderness/mass/nodules Lungs: Decreased breath sounds globally, no rhonchi or wheezes, no rales  Heart: irregularly regular rhythm, S1, S2 normal, no murmur, click, rub or gallop Abdomen: soft, non-tender; bowel sounds normal; no masses,  no organomegaly Extremities: extremities normal, atraumatic, 1+ LLE edema Pulses: 2+ and symmetric Skin: Skin color, texture, turgor normal. No rashes or lesions Neurologic: Grossly normal  EKG: A-fib at 73  ASSESSMENT: 1. Permanent  atrial fibrillation 2. Chronic diastolic heart failure 3. COPD 4. Anemia with AVMs  PLAN: 1.   Mrs. Gargas has had a stable weight and gets intermittent swelling mostly in the left lower extremity. I recommended staying on her current dose of diuretics and elevating her feet is much as possible. She is in permanent atrial fibrillation with good rate control. She is chronically anticoagulated on warfarin and her INR is been around 2.0 according to records from hematology. Her hematocrit is actually somewhat higher recently it has been in a while. There've been no signs of recurrent GI bleeding. Unfortunately she's had a few respiratory infections, which are not unusual for someone with her COPD. I will plan to continue her current medications and see her back in 6 months.  Pixie Casino, MD, [facility] Attending Cardiologist [facility] 08/06/2013, 1:01 PM

## 2013-08-06 NOTE — Patient Instructions (Signed)
Your physician wants you to follow-up in:  6 months. You will receive a reminder letter in the mail two months in advance. If you don't receive a letter, please call our office to schedule the follow-up appointment.   

## 2013-08-13 ENCOUNTER — Other Ambulatory Visit: Payer: Medicare Other | Admitting: Lab

## 2013-08-14 ENCOUNTER — Other Ambulatory Visit (HOSPITAL_BASED_OUTPATIENT_CLINIC_OR_DEPARTMENT_OTHER): Payer: Medicare Other | Admitting: Lab

## 2013-08-14 ENCOUNTER — Ambulatory Visit: Payer: Medicare Other | Admitting: Pharmacist

## 2013-08-14 DIAGNOSIS — K5520 Angiodysplasia of colon without hemorrhage: Secondary | ICD-10-CM

## 2013-08-14 DIAGNOSIS — I4821 Permanent atrial fibrillation: Secondary | ICD-10-CM

## 2013-08-14 DIAGNOSIS — I4891 Unspecified atrial fibrillation: Secondary | ICD-10-CM

## 2013-08-14 DIAGNOSIS — Q2733 Arteriovenous malformation of digestive system vessel: Secondary | ICD-10-CM

## 2013-08-14 DIAGNOSIS — D500 Iron deficiency anemia secondary to blood loss (chronic): Secondary | ICD-10-CM

## 2013-08-14 DIAGNOSIS — I639 Cerebral infarction, unspecified: Secondary | ICD-10-CM

## 2013-08-14 LAB — CBC WITH DIFFERENTIAL (CANCER CENTER ONLY)
BASO#: 0 10*3/uL (ref 0.0–0.2)
BASO%: 0.1 % (ref 0.0–2.0)
EOS%: 1.4 % (ref 0.0–7.0)
Eosinophils Absolute: 0.1 10*3/uL (ref 0.0–0.5)
HEMATOCRIT: 29.2 % — AB (ref 34.8–46.6)
HGB: 8.4 g/dL — ABNORMAL LOW (ref 11.6–15.9)
LYMPH#: 1 10*3/uL (ref 0.9–3.3)
LYMPH%: 12.6 % — AB (ref 14.0–48.0)
MCH: 26.5 pg (ref 26.0–34.0)
MCHC: 28.8 g/dL — AB (ref 32.0–36.0)
MCV: 92 fL (ref 81–101)
MONO#: 0.8 10*3/uL (ref 0.1–0.9)
MONO%: 10.9 % (ref 0.0–13.0)
NEUT#: 5.8 10*3/uL (ref 1.5–6.5)
NEUT%: 75 % (ref 39.6–80.0)
Platelets: 407 10*3/uL — ABNORMAL HIGH (ref 145–400)
RBC: 3.17 10*6/uL — ABNORMAL LOW (ref 3.70–5.32)
RDW: 18.1 % — ABNORMAL HIGH (ref 11.1–15.7)
WBC: 7.7 10*3/uL (ref 3.9–10.0)

## 2013-08-14 LAB — PROTIME-INR (CHCC SATELLITE)
INR: 2.9 (ref 2.0–3.5)
Protime: 34.8 Seconds — ABNORMAL HIGH (ref 10.6–13.4)

## 2013-08-14 LAB — POCT INR: INR: 2.9

## 2013-08-14 NOTE — Progress Notes (Signed)
INR above goal today. Goal INR = 1.8-2.3 Hg/Hct: 8.4/29.2, Pltc: 407 Hemoglobin decreased from Hg on 07/16/13, (down from 10.5). Pt hemoglobin is usually around 9. Pt is tired and is slow to recover after the last round of illness. Ferritin, Iron and TIBC drawn at [facility] today. No unusual bruising. No bleeding noted. No missed coumadin doses. No changes in diet. Will f/u with Ferritin level. Pt took coumadin today.  Will hold Coumadin on 2/12, take 4mg  on 2/13.  On 08/17/13, continue Coumadin 6mg  daily.  This strategy has been successful for pt in the past with similar INR values. Recheck INR in 2 weeks. Recheck INR on 08/26/13 with appointment with Dr. Noah Delaine at [facility].  Spoke with Dr. Doristine Devoid RN. Results will be faxed to [phone]. We will call you with the results. No charge encounter - spoke to patient by telephone.

## 2013-08-14 NOTE — Patient Instructions (Addendum)
You took coumadin today.  Hold Coumadin on 2/12, take 4mg  on 2/13.  On 08/17/13, continue Coumadin 6mg  daily.  Recheck INR on 08/26/13 with appointment with Dr. Alyson Ingles at [facility].  We will call you with the results.

## 2013-08-15 ENCOUNTER — Telehealth: Payer: Self-pay | Admitting: *Deleted

## 2013-08-15 LAB — FERRITIN CHCC: FERRITIN: 161 ng/mL (ref 9–269)

## 2013-08-15 LAB — IRON AND TIBC CHCC
%SAT: 20 % — ABNORMAL LOW (ref 21–57)
IRON: 47 ug/dL (ref 41–142)
TIBC: 239 ug/dL (ref 236–444)
UIBC: 192 ug/dL (ref 120–384)

## 2013-08-15 NOTE — Telephone Encounter (Signed)
Dtr., Gardiner Barefoot called [id number] lab work.  Concerned because she is fatigued and had respiratory illness and did not bounce back like she usually does.   Wonders if her Mom needs ferritin? Let her know that ferritin is 161.  Will have Dr.Granfortuna review labs when he returns and see if he has any recommendations.  Linda's call back is [phone].

## 2013-08-16 ENCOUNTER — Telehealth: Payer: Self-pay | Admitting: *Deleted

## 2013-08-16 IMAGING — CT CT HEAD W/O CM
4 series · 17 of 30 positions shown, 19 images · non-contrast
Comparison: CT head without contrast 08/31/2009.

CT HEAD

CLINICAL DATA: Fall.  Confusion.  Right shoulder pain. Stiff neck.

CT HEAD WITHOUT CONTRAST
CT CERVICAL SPINE WITHOUT CONTRAST
TECHNIQUE: Multidetector CT imaging of the head and cervical spine
was performed following the standard protocol without intravenous
contrast.  Multiplanar CT image reconstructions of the cervical
spine were also generated.

[Series 2: head w/o · axial · non-contrast · 0.43mm/px · z∈[-581,-531]mm · 2 of 30 slices shown]
[im 10/30  brain]
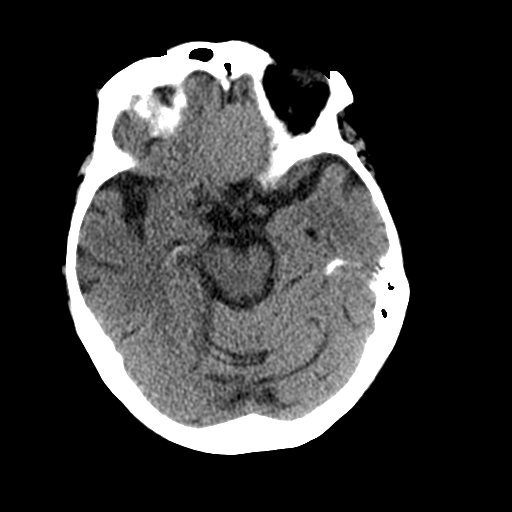
[im 20/30  brain]
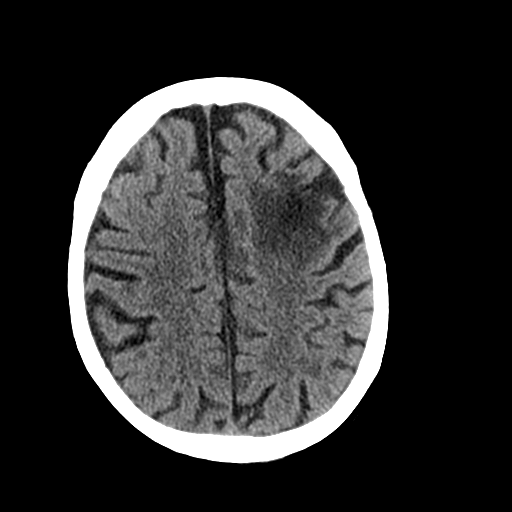

[Series 3: bone windows · axial · 0.43mm/px · z∈[-599,-512]mm · 4 of 49 slices shown]
[im 10/49  bone]
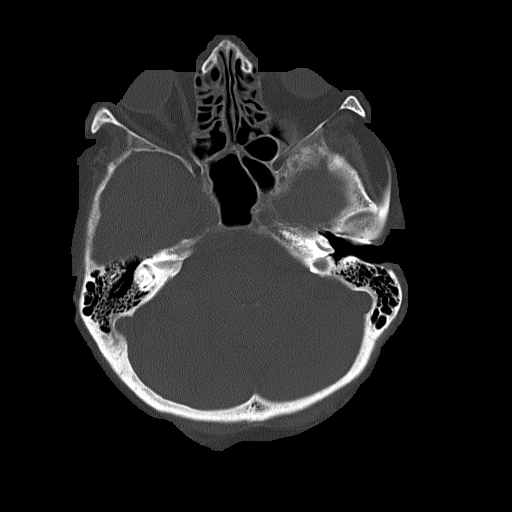
[im 20/49  bone]
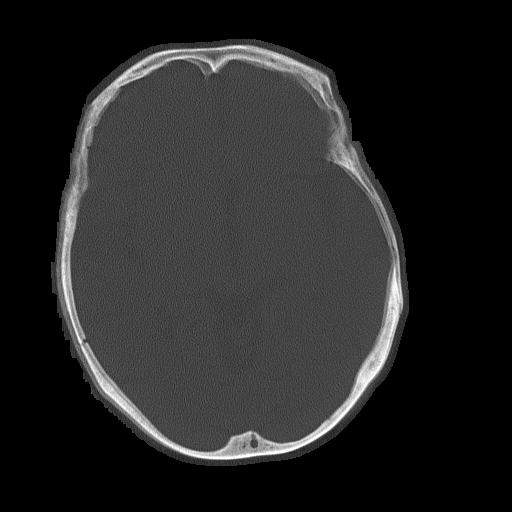
[im 29/49  bone]
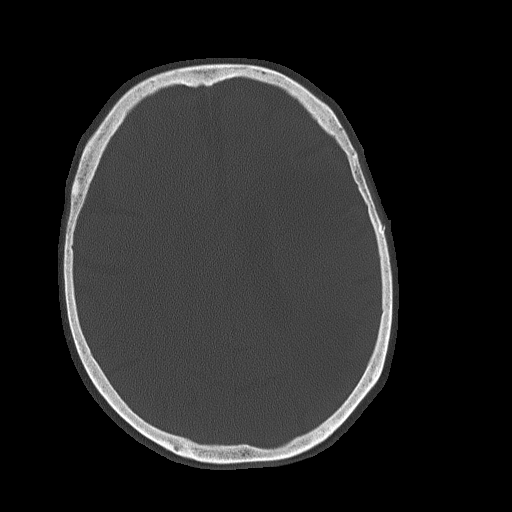
[im 39/49  bone]
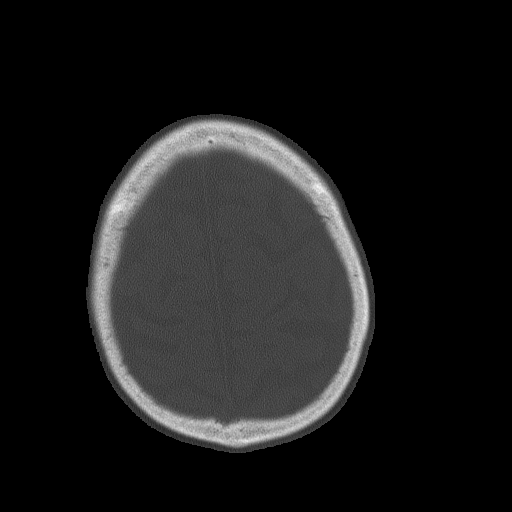

[Series 4: c-spine st · axial · 0.28mm/px · z∈[-758,-722]mm · 3 of 81 slices shown]
[im 9/81  brain]
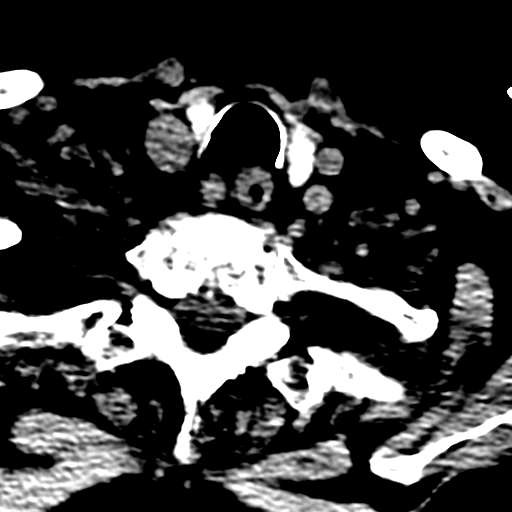
[im 18/81  brain]
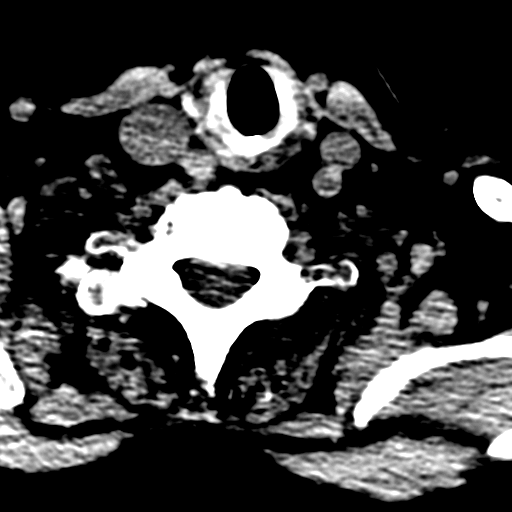
[im 27/81  brain]
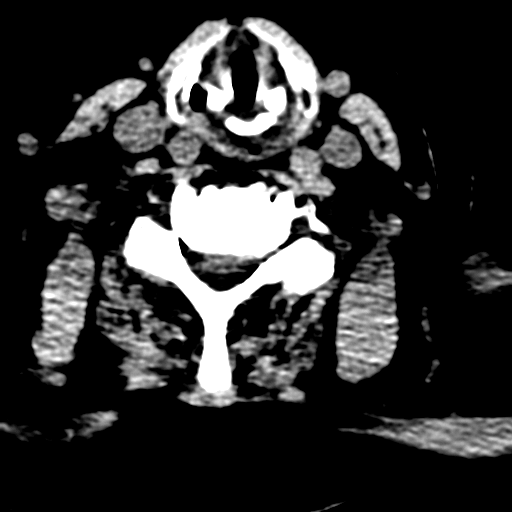

[Series 9: axial recon · axial · 0.23mm/px · z∈[-766,-645]mm · 8 of 80 slices shown, 10 images]
[im 9/80  brain]
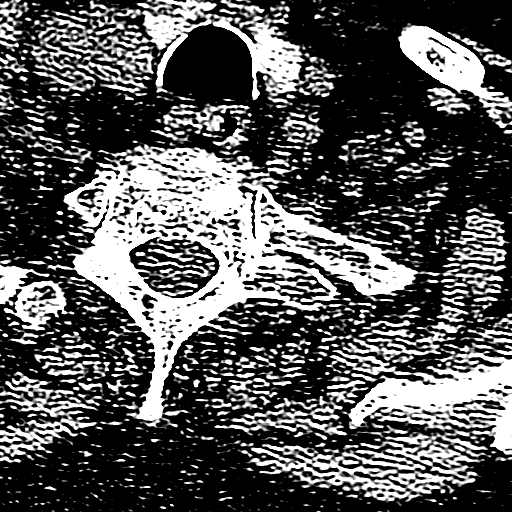
[im 9/80  bone]
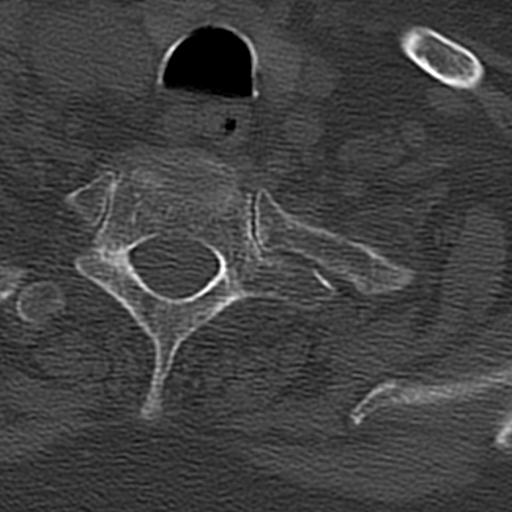
[im 18/80  brain]
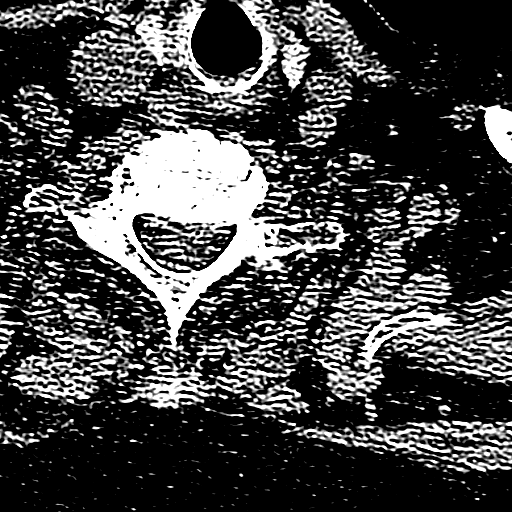
[im 27/80  brain]
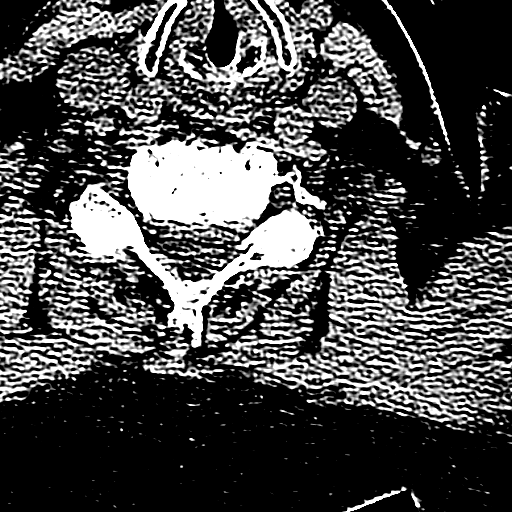
[im 36/80  brain]
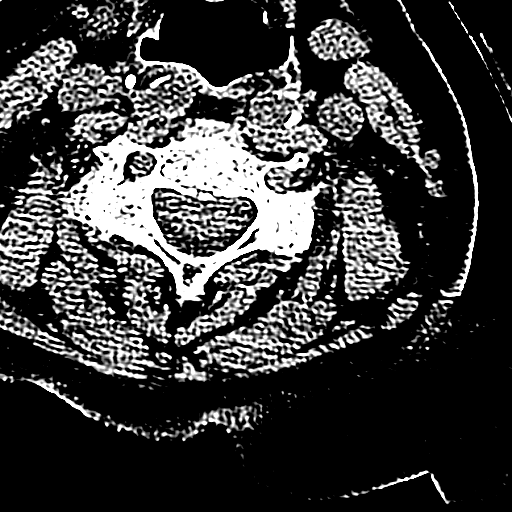
[im 44/80  brain]
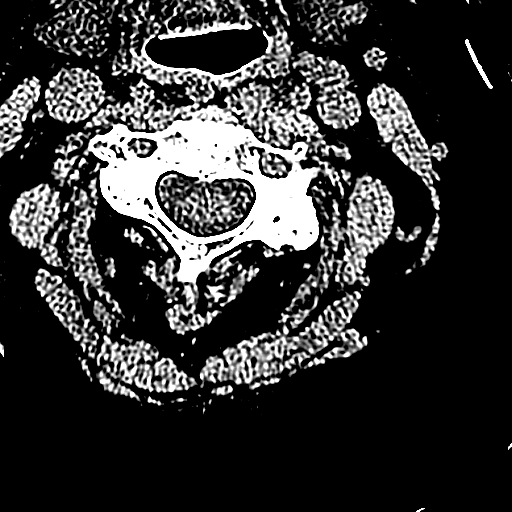
[im 44/80  bone]
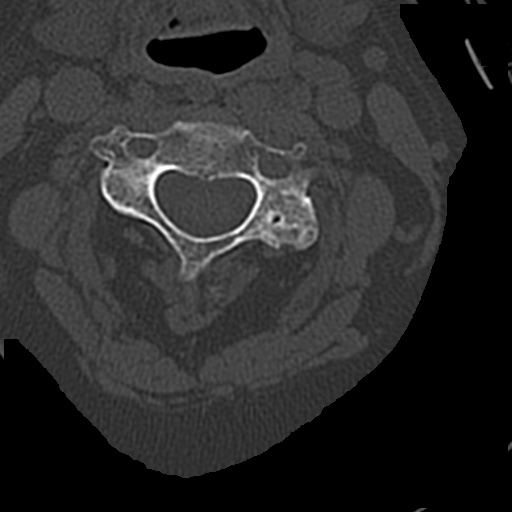
[im 53/80  brain]
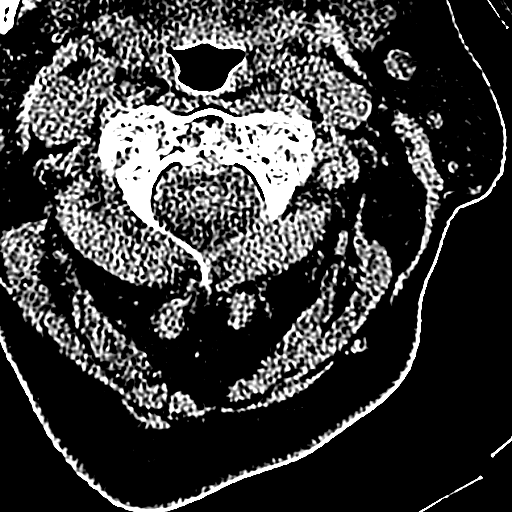
[im 62/80  brain]
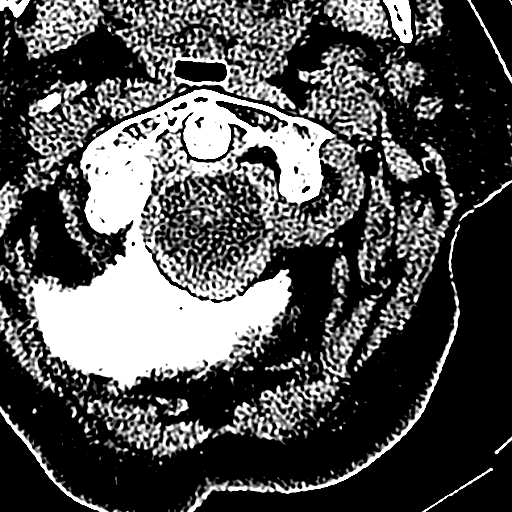
[im 71/80  brain]
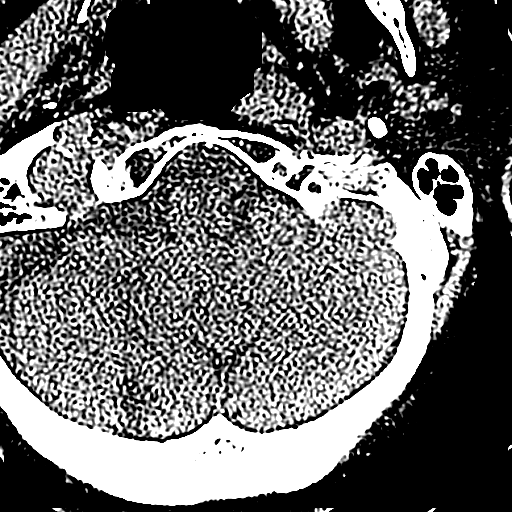

[17 of 30 positions shown; findings below may reference images not displayed]

FINDINGS: Left MCA territory encephalomalacia is stable.  No acute
cortical infarct, hemorrhage, or mass lesion is present.  Atrophy
and moderate white matter disease is unchanged otherwise.  The
ventricles are proportionate to the degree of atrophy with some ex
vacuo dilation on the left.  No significant extra-axial fluid
collection is present.

Mucosal thickening is present in the maxillary sinuses bilaterally.
More mild disease is present in the ethmoid air cells and right
frontal sinus.  The left frontal sinus is not pneumatized.  The
sphenoid sinuses are clear.  The mastoid air cells are clear
bilaterally.
IMPRESSION: 1.  Stable left MCA territory encephalomalacia.
2.  Stable atrophy and white matter disease.
3.  No acute intracranial abnormality.
4.  Mild paranasal sinus disease as described.

CT CERVICAL SPINE
FINDINGS: The cervical spine is imaged from skull base through T1-
2.  Degenerative anterolisthesis is present at C3-4.  Mild
anterolisthesis is present at C4-5.  The facets are fused
bilaterally at C4-5.  Chronic end plate changes are present at C5-6
and C6-7.

The vertebral body heights are maintained.  No acute fracture or
traumatic subluxation is evident.  The lung apices are clear.  The
soft tissues of the neck are unremarkable.  The thyroid is
atrophic.
IMPRESSION: 1.  No acute fracture or traumatic subluxation.
2.  Moderate spondylosis of the cervical spine as described.

## 2013-08-16 IMAGING — CR DG SHOULDER 2+V*R*
2 series · 2 of 2 positions shown · non-contrast
Comparison: None available.

CLINICAL DATA: Fall.

RIGHT SHOULDER - 2+ VIEW

[x shoulder ap right (1 of 2)]
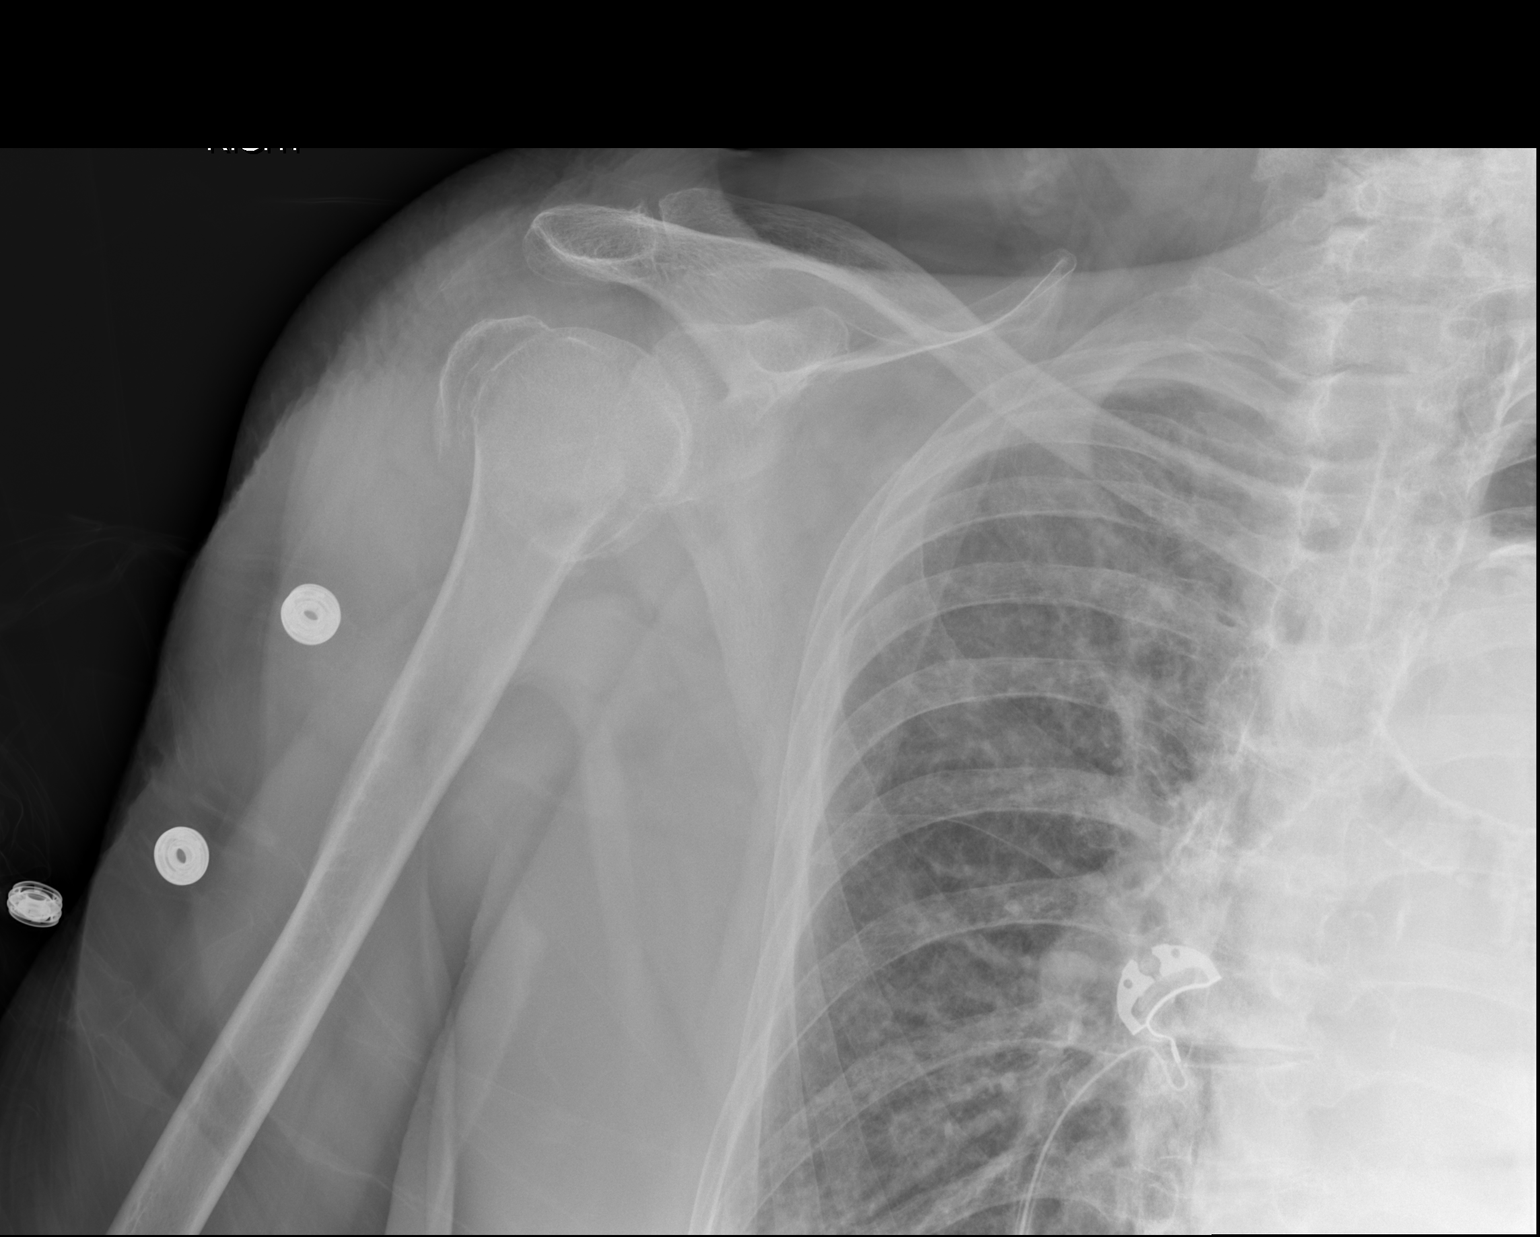

[x shoulder ap right (2 of 2)]
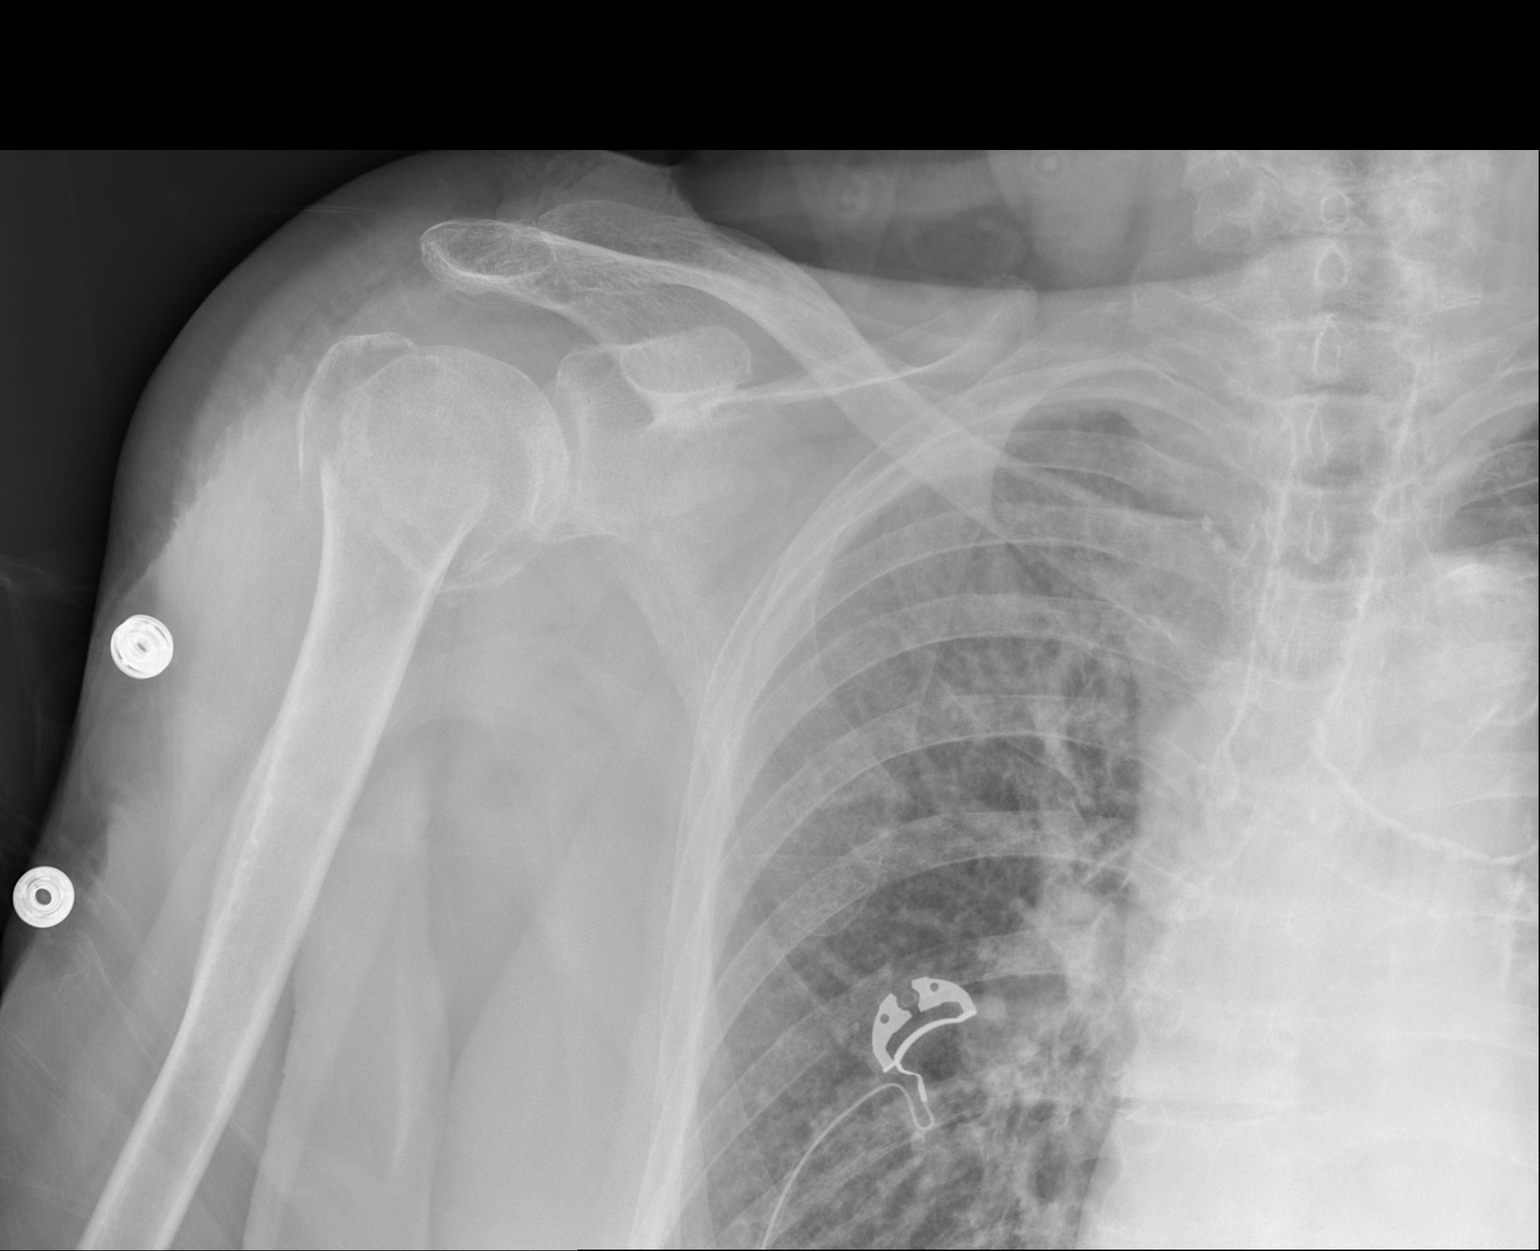

[2 of 2 positions shown; findings below may reference images not displayed]

FINDINGS: A comminuted right humeral head fracture is present.  The
fractures are minimally-displaced.  The glenohumeral joint is
intact.  No additional fractures are seen.  Prominent interstitial
lung markings are chronic.  The visualized right hemithorax is
otherwise unremarkable.
IMPRESSION: 1.  Comminuted right humeral head fracture.
2.  Chronic interstitial coarsening of the right lung is stable.

## 2013-08-16 NOTE — Telephone Encounter (Signed)
Dr. Beryle Beams reviewed lab.  Her ferritin is good.  Her fatigue may not be from her anemia which is chronic.  We could arrange to do one unit of RBC transfusion. Spoke with dtr - Gardiner Barefoot.  She does not want her mother to get blood at this time.  She will wait and see what her labs are in 2 weeks when she sees her PCP 2/23 at [facility].

## 2013-08-19 ENCOUNTER — Other Ambulatory Visit: Payer: Self-pay | Admitting: Emergency Medicine

## 2013-08-26 ENCOUNTER — Telehealth: Payer: Self-pay | Admitting: Pharmacist

## 2013-08-26 LAB — POCT INR: INR: 1.5

## 2013-08-26 NOTE — Telephone Encounter (Signed)
Left message with [facility] for INR results.  They took message and said they will call back.

## 2013-08-27 ENCOUNTER — Ambulatory Visit (INDEPENDENT_AMBULATORY_CARE_PROVIDER_SITE_OTHER): Payer: Self-pay | Admitting: Pharmacist

## 2013-08-27 DIAGNOSIS — I6350 Cerebral infarction due to unspecified occlusion or stenosis of unspecified cerebral artery: Secondary | ICD-10-CM

## 2013-08-27 DIAGNOSIS — I4891 Unspecified atrial fibrillation: Secondary | ICD-10-CM

## 2013-08-27 DIAGNOSIS — I4821 Permanent atrial fibrillation: Secondary | ICD-10-CM

## 2013-08-27 DIAGNOSIS — I639 Cerebral infarction, unspecified: Secondary | ICD-10-CM

## 2013-08-27 NOTE — Patient Instructions (Signed)
Continue Coumadin 6mg  daily.  Recheck INR on 09/11/13 at [facility].

## 2013-08-27 NOTE — Progress Notes (Signed)
INR below goal today. INR goal = 1.8-2.3 Pt missed her dose of coumadin on 08/23/13. INR likely below goal due to missing a dose of her coumadin. No changes in medications or diet. No s/s of clotting per daughter's report. No unusual bleeding. Main concern is patient's continued fatigue/weakness. Pt had CBC done on 08/26/13 at Dr. Doristine Devoid office.  Results to be faxed to Dr. Beryle Beams today. Will continue Coumadin 6mg  daily.  Recheck INR on 09/11/13 at [facility] ~ 10:30am. No charge clinic encounter - Spoke to patient's daughter by phone.

## 2013-08-31 ENCOUNTER — Encounter: Payer: Self-pay | Admitting: *Deleted

## 2013-08-31 ENCOUNTER — Telehealth: Payer: Self-pay | Admitting: *Deleted

## 2013-08-31 NOTE — Telephone Encounter (Signed)
Former pt of Dr. Darnell Level, Letter and [facility] printed. Appt with Dr. Alvy Bimler  7/14

## 2013-09-03 ENCOUNTER — Other Ambulatory Visit: Payer: Self-pay | Admitting: Emergency Medicine

## 2013-09-11 ENCOUNTER — Other Ambulatory Visit (HOSPITAL_BASED_OUTPATIENT_CLINIC_OR_DEPARTMENT_OTHER): Payer: Medicare Other | Admitting: Lab

## 2013-09-11 DIAGNOSIS — D500 Iron deficiency anemia secondary to blood loss (chronic): Secondary | ICD-10-CM

## 2013-09-11 DIAGNOSIS — K5520 Angiodysplasia of colon without hemorrhage: Secondary | ICD-10-CM

## 2013-09-11 DIAGNOSIS — I4891 Unspecified atrial fibrillation: Secondary | ICD-10-CM

## 2013-09-11 LAB — CBC WITH DIFFERENTIAL (CANCER CENTER ONLY)
BASO#: 0 10*3/uL (ref 0.0–0.2)
BASO%: 0.3 % (ref 0.0–2.0)
EOS ABS: 0.2 10*3/uL (ref 0.0–0.5)
EOS%: 1.8 % (ref 0.0–7.0)
HCT: 30.4 % — ABNORMAL LOW (ref 34.8–46.6)
HGB: 8.8 g/dL — ABNORMAL LOW (ref 11.6–15.9)
LYMPH#: 0.9 10*3/uL (ref 0.9–3.3)
LYMPH%: 9.6 % — ABNORMAL LOW (ref 14.0–48.0)
MCH: 26.5 pg (ref 26.0–34.0)
MCHC: 28.9 g/dL — ABNORMAL LOW (ref 32.0–36.0)
MCV: 92 fL (ref 81–101)
MONO#: 1 10*3/uL — AB (ref 0.1–0.9)
MONO%: 10 % (ref 0.0–13.0)
NEUT%: 78.3 % (ref 39.6–80.0)
NEUTROS ABS: 7.6 10*3/uL — AB (ref 1.5–6.5)
Platelets: 397 10*3/uL (ref 145–400)
RBC: 3.32 10*6/uL — AB (ref 3.70–5.32)
RDW: 17.1 % — ABNORMAL HIGH (ref 11.1–15.7)
WBC: 9.7 10*3/uL (ref 3.9–10.0)

## 2013-09-11 LAB — IRON AND TIBC CHCC
%SAT: 19 % — ABNORMAL LOW (ref 21–57)
Iron: 47 ug/dL (ref 41–142)
TIBC: 244 ug/dL (ref 236–444)
UIBC: 197 ug/dL (ref 120–384)

## 2013-09-11 LAB — POCT INR: INR: 2.5

## 2013-09-11 LAB — PROTIME-INR (CHCC SATELLITE)
INR: 2.5 (ref 2.0–3.5)
Protime: 30 Seconds — ABNORMAL HIGH (ref 10.6–13.4)

## 2013-09-11 LAB — FERRITIN CHCC: Ferritin: 141 ng/ml (ref 9–269)

## 2013-09-12 ENCOUNTER — Ambulatory Visit (INDEPENDENT_AMBULATORY_CARE_PROVIDER_SITE_OTHER): Payer: Medicare Other | Admitting: Pharmacist

## 2013-09-12 DIAGNOSIS — I4891 Unspecified atrial fibrillation: Secondary | ICD-10-CM

## 2013-09-12 DIAGNOSIS — I6350 Cerebral infarction due to unspecified occlusion or stenosis of unspecified cerebral artery: Secondary | ICD-10-CM

## 2013-09-12 DIAGNOSIS — I639 Cerebral infarction, unspecified: Secondary | ICD-10-CM

## 2013-09-12 DIAGNOSIS — I4821 Permanent atrial fibrillation: Secondary | ICD-10-CM

## 2013-09-13 ENCOUNTER — Telehealth: Payer: Self-pay | Admitting: *Deleted

## 2013-09-13 NOTE — Telephone Encounter (Signed)
Message copied by [name] on Fri Sep 13, 2013  3:40 PM ------      Message from: Annia Belt      Created: Wed Sep 11, 2013  6:47 PM       Call pt: iron levels OK right now ------

## 2013-09-13 NOTE — Telephone Encounter (Signed)
Notified daughter of stable labs per Dr. Beryle Beams.

## 2013-09-24 ENCOUNTER — Other Ambulatory Visit: Payer: Self-pay | Admitting: *Deleted

## 2013-09-24 DIAGNOSIS — D500 Iron deficiency anemia secondary to blood loss (chronic): Secondary | ICD-10-CM

## 2013-09-27 ENCOUNTER — Ambulatory Visit: Payer: Self-pay | Admitting: Pharmacist

## 2013-09-27 DIAGNOSIS — I639 Cerebral infarction, unspecified: Secondary | ICD-10-CM

## 2013-09-27 DIAGNOSIS — I4821 Permanent atrial fibrillation: Secondary | ICD-10-CM

## 2013-09-27 NOTE — Progress Notes (Signed)
INR slightly above goal today. Goal INR = 1.8-2.3 Hg/Hct: 8.8/30.4, Pltc = 397 Pt missed one coumain dose in the last week. No significant changes in medications. She had occasional Benadryl use. She is eating more high protein shakes. No recent antibiotics. No concerns regarding anticoagulation. No s/s of clotting noted. Take Coumadin 4mg  today (09/12/13).  On 09/13/13, continue Coumadin 6mg  daily.  Recheck INR in 3 weeks on 10/02/13 at [facility] Pt to pt daughter by telephone, [phone]). No charge encounter.

## 2013-09-27 NOTE — Patient Instructions (Signed)
Take Coumadin 4mg  today (09/12/13).  On 09/13/13, continue Coumadin 6mg  daily.  Recheck INR in 3 weeks on 10/02/13 at [facility].

## 2013-09-27 NOTE — Progress Notes (Signed)
Encounter opened in error.  No charge encounter.

## 2013-10-02 ENCOUNTER — Telehealth: Payer: Self-pay | Admitting: Pharmacist

## 2013-10-02 ENCOUNTER — Ambulatory Visit (INDEPENDENT_AMBULATORY_CARE_PROVIDER_SITE_OTHER): Payer: Self-pay | Admitting: Pharmacist

## 2013-10-02 ENCOUNTER — Other Ambulatory Visit (HOSPITAL_BASED_OUTPATIENT_CLINIC_OR_DEPARTMENT_OTHER): Payer: Medicare Other | Admitting: Lab

## 2013-10-02 DIAGNOSIS — I4891 Unspecified atrial fibrillation: Secondary | ICD-10-CM

## 2013-10-02 DIAGNOSIS — I6350 Cerebral infarction due to unspecified occlusion or stenosis of unspecified cerebral artery: Secondary | ICD-10-CM

## 2013-10-02 DIAGNOSIS — K5520 Angiodysplasia of colon without hemorrhage: Secondary | ICD-10-CM

## 2013-10-02 DIAGNOSIS — D500 Iron deficiency anemia secondary to blood loss (chronic): Secondary | ICD-10-CM

## 2013-10-02 DIAGNOSIS — I639 Cerebral infarction, unspecified: Secondary | ICD-10-CM

## 2013-10-02 DIAGNOSIS — I4821 Permanent atrial fibrillation: Secondary | ICD-10-CM

## 2013-10-02 LAB — CBC WITH DIFFERENTIAL (CANCER CENTER ONLY)
BASO#: 0 10*3/uL (ref 0.0–0.2)
BASO%: 0.5 % (ref 0.0–2.0)
EOS%: 6.6 % (ref 0.0–7.0)
Eosinophils Absolute: 0.4 10*3/uL (ref 0.0–0.5)
HCT: 33.8 % — ABNORMAL LOW (ref 34.8–46.6)
HGB: 9.9 g/dL — ABNORMAL LOW (ref 11.6–15.9)
LYMPH#: 0.8 10*3/uL — ABNORMAL LOW (ref 0.9–3.3)
LYMPH%: 12.8 % — ABNORMAL LOW (ref 14.0–48.0)
MCH: 27.1 pg (ref 26.0–34.0)
MCHC: 29.3 g/dL — AB (ref 32.0–36.0)
MCV: 93 fL (ref 81–101)
MONO#: 0.7 10*3/uL (ref 0.1–0.9)
MONO%: 10.7 % (ref 0.0–13.0)
NEUT%: 69.4 % (ref 39.6–80.0)
NEUTROS ABS: 4.3 10*3/uL (ref 1.5–6.5)
PLATELETS: 453 10*3/uL — AB (ref 145–400)
RBC: 3.65 10*6/uL — ABNORMAL LOW (ref 3.70–5.32)
RDW: 17.4 % — AB (ref 11.1–15.7)
WBC: 6.3 10*3/uL (ref 3.9–10.0)

## 2013-10-02 LAB — PROTIME-INR (CHCC SATELLITE)
INR: 2.5 (ref 2.0–3.5)
PROTIME: 30 s — AB (ref 10.6–13.4)

## 2013-10-02 LAB — POCT INR: INR: 2.5

## 2013-10-02 NOTE — Progress Notes (Signed)
INR = 2.5 on Coumadin 6 mg daily I s/w pts dtr over phone.  No complications w/ anticoag. INR elevated.  Pts dtr already dosed her mother today at 4 mg.  Tomorrow she'll resume 6 mg/day. Repeat protime in 3 weeks in [place].  We'll call [name] w/ results that day. NO CHARGE - phone encounter. Kennith Center, Pharm.D., CPP 10/02/2013@4 :14 PM

## 2013-10-03 LAB — FERRITIN CHCC: Ferritin: 166 ng/ml (ref 9–269)

## 2013-10-03 LAB — IRON AND TIBC CHCC
%SAT: 25 % (ref 21–57)
IRON: 66 ug/dL (ref 41–142)
TIBC: 261 ug/dL (ref 236–444)
UIBC: 195 ug/dL (ref 120–384)

## 2013-10-04 ENCOUNTER — Telehealth: Payer: Self-pay | Admitting: *Deleted

## 2013-10-04 NOTE — Telephone Encounter (Signed)
Message copied by Ignacia Felling on Fri Oct 04, 2013 11:06 AM ------      Message from: Annia Belt      Created: Wed Oct 02, 2013  1:15 PM       Call counts stable; INR therapeutic.  She sees Dr Alvy Bimler in July ------

## 2013-10-04 NOTE — Telephone Encounter (Signed)
Spoke with dtr. And let her know that counts are stable and INR is therapeutic.  Dtr. , Gardiner Barefoot, states that coumadin clinic has adjusted her dose.  She verbalized understanding of appt. With Dr. Alvy Bimler in July.  Also reviewed patient's ferritin results at request of dtr.

## 2013-10-23 ENCOUNTER — Other Ambulatory Visit (HOSPITAL_BASED_OUTPATIENT_CLINIC_OR_DEPARTMENT_OTHER): Payer: Medicare Other | Admitting: Lab

## 2013-10-23 DIAGNOSIS — K5520 Angiodysplasia of colon without hemorrhage: Secondary | ICD-10-CM

## 2013-10-23 DIAGNOSIS — I4891 Unspecified atrial fibrillation: Secondary | ICD-10-CM

## 2013-10-23 DIAGNOSIS — D500 Iron deficiency anemia secondary to blood loss (chronic): Secondary | ICD-10-CM

## 2013-10-23 LAB — CBC WITH DIFFERENTIAL (CANCER CENTER ONLY)
BASO#: 0 10*3/uL (ref 0.0–0.2)
BASO%: 0.4 % (ref 0.0–2.0)
EOS%: 5.6 % (ref 0.0–7.0)
Eosinophils Absolute: 0.4 10*3/uL (ref 0.0–0.5)
HEMATOCRIT: 33.9 % — AB (ref 34.8–46.6)
HEMOGLOBIN: 10.3 g/dL — AB (ref 11.6–15.9)
LYMPH#: 1 10*3/uL (ref 0.9–3.3)
LYMPH%: 13.1 % — ABNORMAL LOW (ref 14.0–48.0)
MCH: 27.3 pg (ref 26.0–34.0)
MCHC: 30.4 g/dL — ABNORMAL LOW (ref 32.0–36.0)
MCV: 90 fL (ref 81–101)
MONO#: 0.8 10*3/uL (ref 0.1–0.9)
MONO%: 10.8 % (ref 0.0–13.0)
NEUT#: 5.5 10*3/uL (ref 1.5–6.5)
NEUT%: 70.1 % (ref 39.6–80.0)
Platelets: 368 10*3/uL (ref 145–400)
RBC: 3.77 10*6/uL (ref 3.70–5.32)
RDW: 16.4 % — AB (ref 11.1–15.7)
WBC: 7.8 10*3/uL (ref 3.9–10.0)

## 2013-10-23 LAB — PROTIME-INR (CHCC SATELLITE)
INR: 2.4 (ref 2.0–3.5)
Protime: 28.8 Seconds — ABNORMAL HIGH (ref 10.6–13.4)

## 2013-10-23 LAB — IRON AND TIBC CHCC
%SAT: 57 % (ref 21–57)
Iron: 140 ug/dL (ref 41–142)
TIBC: 245 ug/dL (ref 236–444)
UIBC: 105 ug/dL — ABNORMAL LOW (ref 120–384)

## 2013-10-23 LAB — FERRITIN CHCC: Ferritin: 154 ng/ml (ref 9–269)

## 2013-10-23 LAB — POCT INR: INR: 2.4

## 2013-10-24 ENCOUNTER — Ambulatory Visit (INDEPENDENT_AMBULATORY_CARE_PROVIDER_SITE_OTHER): Payer: Self-pay | Admitting: Pharmacist

## 2013-10-24 DIAGNOSIS — I639 Cerebral infarction, unspecified: Secondary | ICD-10-CM

## 2013-10-24 DIAGNOSIS — I4891 Unspecified atrial fibrillation: Secondary | ICD-10-CM

## 2013-10-24 DIAGNOSIS — I6350 Cerebral infarction due to unspecified occlusion or stenosis of unspecified cerebral artery: Secondary | ICD-10-CM

## 2013-10-24 DIAGNOSIS — I4821 Permanent atrial fibrillation: Secondary | ICD-10-CM

## 2013-10-24 NOTE — Patient Instructions (Signed)
Take 4 mg on Wednesday then restart 6 mg/day on Thursday 10/24/13. Repeat lab in [place] on 11/13/13 at 10:30 am.  A pharmacist will call [name] [phone])

## 2013-10-24 NOTE — Progress Notes (Signed)
**  No Charge- Phone Encounter** INR slightly above goal on Wednesday 10/23/13 at 2.4 Spoke to patient's daughter, [name], over the phone on Thursday 10/24/13 (VM left on 10/23/13) Pt is doing well with no complaints No unusual bleeding or bruising [name] reports that pt missed one 6 mg dose last Friday When [place] learned of this she had Lexine take 8 mg on Monday and Tuesday this week [name] already had patient take 4 mg yesterday when INR result was reported No other issues of note, no diet changes or medication changes Plan: Take 4 mg on Wednesday 10/23/13 then restart 6 mg/day on Thursday 10/24/13. Repeat lab in [place] on 11/13/13 at 10:30 am.  A pharmacist will call [name] [phone])

## 2013-11-05 ENCOUNTER — Other Ambulatory Visit: Payer: Self-pay | Admitting: Emergency Medicine

## 2013-11-13 ENCOUNTER — Encounter: Payer: Self-pay | Admitting: Pharmacist

## 2013-11-13 ENCOUNTER — Other Ambulatory Visit (HOSPITAL_BASED_OUTPATIENT_CLINIC_OR_DEPARTMENT_OTHER): Payer: Medicare Other | Admitting: Lab

## 2013-11-13 DIAGNOSIS — I4891 Unspecified atrial fibrillation: Secondary | ICD-10-CM

## 2013-11-13 LAB — PROTIME-INR (CHCC SATELLITE)
INR: 1.5 — AB (ref 2.0–3.5)
Protime: 18 Seconds — ABNORMAL HIGH (ref 10.6–13.4)

## 2013-11-13 LAB — POCT INR: INR: 1.5

## 2013-11-13 NOTE — Progress Notes (Signed)
Left VM with Gardiner Barefoot, daughter, to call [facility] to discuss INR results and coumadin plan.

## 2013-11-14 ENCOUNTER — Ambulatory Visit: Payer: Medicare Other | Admitting: Pharmacist

## 2013-11-14 DIAGNOSIS — I4821 Permanent atrial fibrillation: Secondary | ICD-10-CM

## 2013-11-14 DIAGNOSIS — I639 Cerebral infarction, unspecified: Secondary | ICD-10-CM

## 2013-11-14 NOTE — Progress Notes (Signed)
*  Telephone Encounter - No Charge* INR below goal at 1.5 (Result from 11/13/13) Spoke to Daughter, [name], over the phone today after leaving message with daughter yesterday. [name] states patient is doing well with no issues other than requiring a little more lasix for fluid in her legs No unusual bleeding or bruising No missed or extra doses No other medication or diet changes Pt has been stable on 6 mg daily will not make permanent changes now Plan: Take 8 mg today (Thursday) then restart 6 mg/day on Friday 11/15/13. Repeat lab in [place] on 12/11/13 at 11 am.  A pharmacist will call [name] [phone])

## 2013-11-14 NOTE — Patient Instructions (Signed)
INR below goal today Take 8 mg today (Thursday) then restart 6 mg/day on Friday 11/15/13. Repeat lab in [place] on 12/11/13 at 11 am.  A pharmacist will call [name] [phone])

## 2013-12-10 ENCOUNTER — Other Ambulatory Visit (HOSPITAL_BASED_OUTPATIENT_CLINIC_OR_DEPARTMENT_OTHER): Payer: Medicare Other | Admitting: Lab

## 2013-12-10 DIAGNOSIS — D500 Iron deficiency anemia secondary to blood loss (chronic): Secondary | ICD-10-CM

## 2013-12-10 DIAGNOSIS — K5520 Angiodysplasia of colon without hemorrhage: Secondary | ICD-10-CM

## 2013-12-10 DIAGNOSIS — I4891 Unspecified atrial fibrillation: Secondary | ICD-10-CM

## 2013-12-10 LAB — PROTIME-INR (CHCC SATELLITE)
INR: 1.6 — ABNORMAL LOW (ref 2.0–3.5)
PROTIME: 19.2 s — AB (ref 10.6–13.4)

## 2013-12-10 LAB — CBC WITH DIFFERENTIAL (CANCER CENTER ONLY)
BASO#: 0 10*3/uL (ref 0.0–0.2)
BASO%: 0.3 % (ref 0.0–2.0)
EOS ABS: 0.3 10*3/uL (ref 0.0–0.5)
EOS%: 5.7 % (ref 0.0–7.0)
HCT: 33 % — ABNORMAL LOW (ref 34.8–46.6)
HGB: 10.2 g/dL — ABNORMAL LOW (ref 11.6–15.9)
LYMPH#: 0.8 10*3/uL — ABNORMAL LOW (ref 0.9–3.3)
LYMPH%: 13.4 % — AB (ref 14.0–48.0)
MCH: 27.4 pg (ref 26.0–34.0)
MCHC: 30.9 g/dL — ABNORMAL LOW (ref 32.0–36.0)
MCV: 89 fL (ref 81–101)
MONO#: 0.7 10*3/uL (ref 0.1–0.9)
MONO%: 11.2 % (ref 0.0–13.0)
NEUT#: 4 10*3/uL (ref 1.5–6.5)
NEUT%: 69.4 % (ref 39.6–80.0)
Platelets: 413 10*3/uL — ABNORMAL HIGH (ref 145–400)
RBC: 3.72 10*6/uL (ref 3.70–5.32)
RDW: 16.3 % — AB (ref 11.1–15.7)
WBC: 5.8 10*3/uL (ref 3.9–10.0)

## 2013-12-10 LAB — COMPREHENSIVE METABOLIC PANEL
ALBUMIN: 3.1 g/dL — AB (ref 3.5–5.2)
ALT: 11 U/L (ref 0–35)
AST: 14 U/L (ref 0–37)
Alkaline Phosphatase: 73 U/L (ref 39–117)
BUN: 11 mg/dL (ref 6–23)
CALCIUM: 9.4 mg/dL (ref 8.4–10.5)
CO2: 31 meq/L (ref 19–32)
Chloride: 100 mEq/L (ref 96–112)
Creatinine, Ser: 0.95 mg/dL (ref 0.50–1.10)
GLUCOSE: 123 mg/dL — AB (ref 70–99)
POTASSIUM: 3 meq/L — AB (ref 3.5–5.3)
SODIUM: 142 meq/L (ref 135–145)
TOTAL PROTEIN: 5.1 g/dL — AB (ref 6.0–8.3)
Total Bilirubin: 0.3 mg/dL (ref 0.2–1.2)

## 2013-12-10 LAB — IRON AND TIBC CHCC
%SAT: 19 % — ABNORMAL LOW (ref 21–57)
IRON: 44 ug/dL (ref 41–142)
TIBC: 235 ug/dL — AB (ref 236–444)
UIBC: 191 ug/dL (ref 120–384)

## 2013-12-10 LAB — FERRITIN CHCC: Ferritin: 169 ng/ml (ref 9–269)

## 2013-12-10 LAB — POCT INR: INR: 1.6

## 2013-12-11 ENCOUNTER — Telehealth: Payer: Self-pay | Admitting: *Deleted

## 2013-12-11 ENCOUNTER — Other Ambulatory Visit: Payer: Medicare Other | Admitting: Lab

## 2013-12-11 NOTE — Telephone Encounter (Signed)
Message copied by [name] on Wed Dec 11, 2013  1:35 PM ------      Message from: Angelica [name]      Created: Tue Dec 10, 2013  5:52 PM       Call patient  Iron studies good right now ------

## 2013-12-11 NOTE — Telephone Encounter (Signed)
Message left earlier on daughter's mobile # & she called back.  Informed that iron studies good per Dr Beryle Beams.  Actual results given.  She has a f/u with Dr Alvy Bimler soon.  Daughter was OK with labs.

## 2013-12-12 ENCOUNTER — Ambulatory Visit: Payer: Medicare Other | Admitting: Pharmacist

## 2013-12-12 DIAGNOSIS — I4821 Permanent atrial fibrillation: Secondary | ICD-10-CM

## 2013-12-12 DIAGNOSIS — I639 Cerebral infarction, unspecified: Secondary | ICD-10-CM

## 2013-12-12 NOTE — Progress Notes (Signed)
INR = 1.6 on Coumadin 6 mg daily. I s/w pts dtr [name] over the phone today ([place]) & she told me her mother missed a dose of Coumadin. [name] "boosted" her mothers dose over 1-2 days already. I will have pt continue her 6 mg/day. She is coming here 01/14/14 to see Dr. Alvy Bimler so we'll see her in [facility] that same day.  [name] is aware. Kennith Center, Pharm.D., CPP 12/12/2013@12 :43 PM

## 2013-12-16 ENCOUNTER — Other Ambulatory Visit: Payer: Self-pay | Admitting: Emergency Medicine

## 2014-01-06 ENCOUNTER — Emergency Department (HOSPITAL_BASED_OUTPATIENT_CLINIC_OR_DEPARTMENT_OTHER): Payer: Medicare Other

## 2014-01-06 ENCOUNTER — Encounter (HOSPITAL_BASED_OUTPATIENT_CLINIC_OR_DEPARTMENT_OTHER): Payer: Self-pay | Admitting: Emergency Medicine

## 2014-01-06 ENCOUNTER — Inpatient Hospital Stay (HOSPITAL_BASED_OUTPATIENT_CLINIC_OR_DEPARTMENT_OTHER)
Admission: EM | Admit: 2014-01-06 | Discharge: 2014-01-08 | DRG: 603 | Disposition: A | Discharge status: Home Health | Payer: Medicare Other | Attending: Internal Medicine | Admitting: Internal Medicine

## 2014-01-06 DIAGNOSIS — I639 Cerebral infarction, unspecified: Secondary | ICD-10-CM

## 2014-01-06 DIAGNOSIS — J42 Unspecified chronic bronchitis: Secondary | ICD-10-CM

## 2014-01-06 DIAGNOSIS — Z8673 Personal history of transient ischemic attack (TIA), and cerebral infarction without residual deficits: Secondary | ICD-10-CM

## 2014-01-06 DIAGNOSIS — R531 Weakness: Secondary | ICD-10-CM | POA: Diagnosis present

## 2014-01-06 DIAGNOSIS — L0390 Cellulitis, unspecified: Secondary | ICD-10-CM | POA: Insufficient documentation

## 2014-01-06 DIAGNOSIS — K219 Gastro-esophageal reflux disease without esophagitis: Secondary | ICD-10-CM | POA: Diagnosis present

## 2014-01-06 DIAGNOSIS — Z806 Family history of leukemia: Secondary | ICD-10-CM

## 2014-01-06 DIAGNOSIS — Z800 Family history of malignant neoplasm of digestive organs: Secondary | ICD-10-CM

## 2014-01-06 DIAGNOSIS — D500 Iron deficiency anemia secondary to blood loss (chronic): Secondary | ICD-10-CM | POA: Diagnosis present

## 2014-01-06 DIAGNOSIS — J4489 Other specified chronic obstructive pulmonary disease: Secondary | ICD-10-CM | POA: Diagnosis present

## 2014-01-06 DIAGNOSIS — Z79899 Other long term (current) drug therapy: Secondary | ICD-10-CM

## 2014-01-06 DIAGNOSIS — I5033 Acute on chronic diastolic (congestive) heart failure: Secondary | ICD-10-CM

## 2014-01-06 DIAGNOSIS — F0390 Unspecified dementia without behavioral disturbance: Secondary | ICD-10-CM | POA: Diagnosis present

## 2014-01-06 DIAGNOSIS — K5520 Angiodysplasia of colon without hemorrhage: Secondary | ICD-10-CM

## 2014-01-06 DIAGNOSIS — I5042 Chronic combined systolic (congestive) and diastolic (congestive) heart failure: Secondary | ICD-10-CM

## 2014-01-06 DIAGNOSIS — D801 Nonfamilial hypogammaglobulinemia: Secondary | ICD-10-CM

## 2014-01-06 DIAGNOSIS — M129 Arthropathy, unspecified: Secondary | ICD-10-CM | POA: Diagnosis present

## 2014-01-06 DIAGNOSIS — J302 Other seasonal allergic rhinitis: Secondary | ICD-10-CM

## 2014-01-06 DIAGNOSIS — I10 Essential (primary) hypertension: Secondary | ICD-10-CM | POA: Diagnosis present

## 2014-01-06 DIAGNOSIS — R197 Diarrhea, unspecified: Secondary | ICD-10-CM

## 2014-01-06 DIAGNOSIS — I4821 Permanent atrial fibrillation: Secondary | ICD-10-CM | POA: Diagnosis present

## 2014-01-06 DIAGNOSIS — I509 Heart failure, unspecified: Secondary | ICD-10-CM | POA: Diagnosis present

## 2014-01-06 DIAGNOSIS — Z8249 Family history of ischemic heart disease and other diseases of the circulatory system: Secondary | ICD-10-CM

## 2014-01-06 DIAGNOSIS — L03119 Cellulitis of unspecified part of limb: Secondary | ICD-10-CM | POA: Insufficient documentation

## 2014-01-06 DIAGNOSIS — L02419 Cutaneous abscess of limb, unspecified: Principal | ICD-10-CM

## 2014-01-06 DIAGNOSIS — I4891 Unspecified atrial fibrillation: Secondary | ICD-10-CM | POA: Diagnosis present

## 2014-01-06 DIAGNOSIS — T502X5A Adverse effect of carbonic-anhydrase inhibitors, benzothiadiazides and other diuretics, initial encounter: Secondary | ICD-10-CM | POA: Diagnosis present

## 2014-01-06 DIAGNOSIS — Z823 Family history of stroke: Secondary | ICD-10-CM

## 2014-01-06 DIAGNOSIS — L03116 Cellulitis of left lower limb: Secondary | ICD-10-CM | POA: Diagnosis not present

## 2014-01-06 DIAGNOSIS — J449 Chronic obstructive pulmonary disease, unspecified: Secondary | ICD-10-CM | POA: Diagnosis present

## 2014-01-06 DIAGNOSIS — IMO0002 Reserved for concepts with insufficient information to code with codable children: Secondary | ICD-10-CM

## 2014-01-06 DIAGNOSIS — Z7901 Long term (current) use of anticoagulants: Secondary | ICD-10-CM

## 2014-01-06 DIAGNOSIS — E876 Hypokalemia: Secondary | ICD-10-CM | POA: Diagnosis present

## 2014-01-06 LAB — COMPREHENSIVE METABOLIC PANEL
ALBUMIN: 2.9 g/dL — AB (ref 3.5–5.2)
ALK PHOS: 110 U/L (ref 39–117)
ALT: 11 U/L (ref 0–35)
AST: 16 U/L (ref 0–37)
Anion gap: 9 (ref 5–15)
BILIRUBIN TOTAL: 0.3 mg/dL (ref 0.3–1.2)
BUN: 9 mg/dL (ref 6–23)
CHLORIDE: 101 meq/L (ref 96–112)
CO2: 34 meq/L — AB (ref 19–32)
CREATININE: 0.9 mg/dL (ref 0.50–1.10)
Calcium: 9.6 mg/dL (ref 8.4–10.5)
GFR calc Af Amer: 64 mL/min — ABNORMAL LOW (ref >90)
GFR, EST NON AFRICAN AMERICAN: 55 mL/min — AB (ref >90)
Glucose, Bld: 97 mg/dL (ref 70–99)
POTASSIUM: 2.7 meq/L — AB (ref 3.7–5.3)
Sodium: 144 mEq/L (ref 137–147)
Total Protein: 5.6 g/dL — ABNORMAL LOW (ref 6.0–8.3)

## 2014-01-06 LAB — CBC
HCT: 34.3 % — ABNORMAL LOW (ref 36.0–46.0)
Hemoglobin: 10.6 g/dL — ABNORMAL LOW (ref 12.0–15.0)
MCH: 27.3 pg (ref 26.0–34.0)
MCHC: 30.9 g/dL (ref 30.0–36.0)
MCV: 88.4 fL (ref 78.0–100.0)
Platelets: 409 10*3/uL — ABNORMAL HIGH (ref 150–400)
RBC: 3.88 MIL/uL (ref 3.87–5.11)
RDW: 17.2 % — AB (ref 11.5–15.5)
WBC: 7.7 10*3/uL (ref 4.0–10.5)

## 2014-01-06 LAB — PROTIME-INR
INR: 2.05 — ABNORMAL HIGH (ref 0.00–1.49)
PROTHROMBIN TIME: 23.1 s — AB (ref 11.6–15.2)

## 2014-01-06 LAB — MAGNESIUM: MAGNESIUM: 1.8 mg/dL (ref 1.5–2.5)

## 2014-01-06 MED ORDER — POTASSIUM CHLORIDE CRYS ER 20 MEQ PO TBCR
40.0000 meq | EXTENDED_RELEASE_TABLET | Freq: Once | ORAL | Status: AC
  Administered 2014-01-06: 40 meq via ORAL
  Filled 2014-01-06: qty 2

## 2014-01-06 MED ORDER — ACETAMINOPHEN 325 MG PO TABS: 650.0000 mg | ORAL_TABLET | Freq: Four times a day (QID) | ORAL | Status: DC | PRN

## 2014-01-06 MED ORDER — ALBUTEROL SULFATE (2.5 MG/3ML) 0.083% IN NEBU
INHALATION_SOLUTION | RESPIRATORY_TRACT | Status: AC
  Filled 2014-01-06: qty 3

## 2014-01-06 MED ORDER — FLUTICASONE PROPIONATE 50 MCG/ACT NA SUSP
2.0000 | Freq: Every day | NASAL | Status: DC
  Administered 2014-01-06 – 2014-01-08 (×3): 2 via NASAL
  Filled 2014-01-06: qty 16

## 2014-01-06 MED ORDER — ENOXAPARIN SODIUM 40 MG/0.4ML ~~LOC~~ SOLN: 40.0000 mg | SUBCUTANEOUS | Status: DC

## 2014-01-06 MED ORDER — FERROUS SULFATE 325 (65 FE) MG PO TABS
325.0000 mg | ORAL_TABLET | Freq: Two times a day (BID) | ORAL | Status: DC
  Administered 2014-01-06 – 2014-01-08 (×4): 325 mg via ORAL
  Filled 2014-01-06 (×6): qty 1

## 2014-01-06 MED ORDER — WARFARIN SODIUM 6 MG PO TABS
6.0000 mg | ORAL_TABLET | Freq: Once | ORAL | Status: AC
  Administered 2014-01-06: 6 mg via ORAL
  Filled 2014-01-06: qty 1

## 2014-01-06 MED ORDER — SIMVASTATIN 10 MG PO TABS
10.0000 mg | ORAL_TABLET | Freq: Every day | ORAL | Status: DC
  Administered 2014-01-06 – 2014-01-07 (×2): 10 mg via ORAL
  Filled 2014-01-06 (×3): qty 1

## 2014-01-06 MED ORDER — FUROSEMIDE 40 MG PO TABS
40.0000 mg | ORAL_TABLET | Freq: Every day | ORAL | Status: DC
  Administered 2014-01-07 – 2014-01-08 (×2): 40 mg via ORAL
  Filled 2014-01-06 (×2): qty 1

## 2014-01-06 MED ORDER — ADULT MULTIVITAMIN W/MINERALS CH
1.0000 | ORAL_TABLET | Freq: Every evening | ORAL | Status: DC
  Administered 2014-01-06 – 2014-01-07 (×2): 1 via ORAL
  Filled 2014-01-06 (×3): qty 1

## 2014-01-06 MED ORDER — VANCOMYCIN HCL IN DEXTROSE 1-5 GM/200ML-% IV SOLN
1000.0000 mg | Freq: Once | INTRAVENOUS | Status: AC
  Administered 2014-01-06: 1000 mg via INTRAVENOUS
  Filled 2014-01-06: qty 200

## 2014-01-06 MED ORDER — VANCOMYCIN HCL IN DEXTROSE 1-5 GM/200ML-% IV SOLN
1000.0000 mg | INTRAVENOUS | Status: DC
  Administered 2014-01-07: 1000 mg via INTRAVENOUS
  Filled 2014-01-06 (×2): qty 200

## 2014-01-06 MED ORDER — LORATADINE 10 MG PO TABS
10.0000 mg | ORAL_TABLET | Freq: Every day | ORAL | Status: DC
  Administered 2014-01-06 – 2014-01-08 (×3): 10 mg via ORAL
  Filled 2014-01-06 (×3): qty 1

## 2014-01-06 MED ORDER — DILTIAZEM HCL ER 180 MG PO CP24
180.0000 mg | ORAL_CAPSULE | Freq: Every day | ORAL | Status: DC
  Administered 2014-01-07 – 2014-01-08 (×2): 180 mg via ORAL
  Filled 2014-01-06 (×3): qty 1

## 2014-01-06 MED ORDER — WARFARIN - PHARMACIST DOSING INPATIENT: Freq: Every day | Status: DC

## 2014-01-06 MED ORDER — POTASSIUM CHLORIDE 10 MEQ/100ML IV SOLN
10.0000 meq | Freq: Once | INTRAVENOUS | Status: AC
  Administered 2014-01-06: 10 meq via INTRAVENOUS
  Filled 2014-01-06: qty 100

## 2014-01-06 MED ORDER — PANTOPRAZOLE SODIUM 40 MG PO TBEC
40.0000 mg | DELAYED_RELEASE_TABLET | Freq: Every day | ORAL | Status: DC
  Administered 2014-01-06 – 2014-01-08 (×3): 40 mg via ORAL
  Filled 2014-01-06 (×3): qty 1

## 2014-01-06 MED ORDER — ALBUTEROL SULFATE (2.5 MG/3ML) 0.083% IN NEBU
2.5000 mg | INHALATION_SOLUTION | Freq: Three times a day (TID) | RESPIRATORY_TRACT | Status: DC
  Administered 2014-01-06 – 2014-01-08 (×6): 2.5 mg via RESPIRATORY_TRACT
  Filled 2014-01-06 (×6): qty 3

## 2014-01-06 MED ORDER — VITAMIN D3 25 MCG (1000 UNIT) PO TABS
2000.0000 [IU] | ORAL_TABLET | Freq: Every day | ORAL | Status: DC
  Administered 2014-01-06 – 2014-01-08 (×3): 2000 [IU] via ORAL
  Filled 2014-01-06 (×3): qty 2

## 2014-01-06 MED ORDER — BUDESONIDE 0.5 MG/2ML IN SUSP
0.5000 mg | Freq: Two times a day (BID) | RESPIRATORY_TRACT | Status: DC
  Administered 2014-01-06 – 2014-01-08 (×4): 0.5 mg via RESPIRATORY_TRACT
  Filled 2014-01-06 (×9): qty 2

## 2014-01-06 MED ORDER — ACETAMINOPHEN 650 MG RE SUPP: 650.0000 mg | Freq: Four times a day (QID) | RECTAL | Status: DC | PRN

## 2014-01-06 MED ORDER — ALBUTEROL SULFATE (2.5 MG/3ML) 0.083% IN NEBU: 2.5000 mg | INHALATION_SOLUTION | Freq: Once | RESPIRATORY_TRACT | Status: AC

## 2014-01-06 MED ORDER — PREDNISONE 10 MG PO TABS
10.0000 mg | ORAL_TABLET | ORAL | Status: DC
  Administered 2014-01-06 – 2014-01-08 (×2): 10 mg via ORAL
  Filled 2014-01-06 (×2): qty 1

## 2014-01-06 MED ORDER — SODIUM CHLORIDE 0.9 % IJ SOLN
3.0000 mL | Freq: Two times a day (BID) | INTRAMUSCULAR | Status: DC
  Administered 2014-01-06 – 2014-01-08 (×4): 3 mL via INTRAVENOUS

## 2014-01-06 MED ORDER — FUROSEMIDE 80 MG PO TABS: 80.0000 mg | ORAL_TABLET | Freq: Every day | ORAL | Status: DC

## 2014-01-06 NOTE — H&P (Signed)
[facility] History and Physical  Donne Robillard IRC:[id number] DOB: 04/21/1923 DOA: 01/06/2014  Referring physician:Dr Delo PCP: [name], MD   Chief Complaint:  Cellulitis of left leg  HPI:  80 year old female with severe dementia , A. fib on Coumadin, history of CHF, hypertension, COPD, iron deficiency anemia, GERD, [place] noticing left leg swelling and redness since yesterday. Patient has CVA dementia . I called her husband who was unable to provide much history and I was unable to reach her daughter [name] on the phone. No hx of  headache, dizziness, fever, chills, nausea , vomiting, chest pain, palpitations, SOB, abdominal pain, bowel or urinary symptoms. No history of injury to the area. Indeed the patient had normal vitals. Blood work done showed hemoglobin of 10.6, hematocrit 34.3 and platelets of 409. She showed sodium of 144, K. of 2.7, chloride of 101, CO2 of 34, normal renal function and INR of 2. Doppler of left lower extremity was negative for DVT. Chest x-ray was unremarkable. Given a dose of IV vancomycin and transferred to [facility] for management of cellulitis of her left leg.   Review of Systems:  Limited due to severe dementia. As outlined in history of present illness   Past Medical History  Diagnosis Date  . Atrial fibrillation   . Hypertension   . Arthritis   . GERD (gastroesophageal reflux disease)   . COPD (chronic obstructive pulmonary disease)   . PNA (pneumonia)   . Anemia due to GI blood loss 07/11/2011  . 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 Stroke 2009    aphasia and memory impairment as residual  . Hypoxia   . Atrial fibrillation, permanent, rate controlled on coumadin 05/10/2011  . Hypogammaglobulinemia 06/01/2012    Immunoglobulins 05/01/2012-normal IgG M., normal IgA,  low IgG  [phone]).   . 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 CVA history 05/10/2011  . CHF (congestive heart failure) 51/08/5850    diastolic  . History of nuclear stress test 2009    normal dobutamine myoview   Past Surgical History  Procedure Laterality Date  . Hip fracture surgery    . Cholecystectomy    . Knee surgery    . Cataract extraction    . Transthoracic echocardiogram  06/2011    EF 55-60%; mild LVH; LA & RA mod dilated; mod TR   Social History:  reports that she has never smoked. She has never used smokeless tobacco. She reports that she drinks about 4.2 ounces of alcohol per week. She reports that she does not use illicit drugs.  Allergies  Allergen Reactions  . Amoxicillin Hives  . Other     Ragweed--wheezing, congestion, etc  . Penicillins Hives    Family History  Problem Relation Age of Onset  . Heart disease Mother   . Rectal cancer Mother   . Leukemia Father   . Heart failure Mother   . Stroke Mother   . Cancer Maternal Grandmother   . Hypertension Son   . Hypertension Daughter     Prior to Admission medications   Medication Sig Start Date End Date Taking? Authorizing Provider  acetaminophen (TYLENOL) 500 MG tablet Take 1,000 mg by mouth every 6 (six) hours as needed.    Yes Historical Provider, MD  albuterol (PROVENTIL) (2.5 MG/3ML) 0.083% nebulizer solution Take 2.5 mg by nebulization 3 (three) times daily.    Yes Historical Provider,  MD  alendronate (FOSAMAX) 70 MG tablet Take 70 mg by mouth every 7 (seven) days. Take with a full glass of water on an empty stomach. On tuesdays   Yes Historical Provider, MD  budesonide (PULMICORT) 0.5 MG/2ML nebulizer solution Take 0.5 mg by nebulization 2 (two) times daily.   Yes Historical Provider, MD  Cholecalciferol (VITAMIN D) 2000 UNITS CAPS Take 2,000 Units by mouth daily.    Yes Historical Provider, MD  diltiazem (DILACOR XR) 180 MG 24 hr capsule Take 180 mg by mouth daily before breakfast.    Yes Historical Provider,  MD  ferrous sulfate 325 (65 FE) MG tablet Take 325 mg by mouth 2 (two) times daily with a meal.    Yes Historical Provider, MD  fluticasone (FLONASE) 50 MCG/ACT nasal spray Place 2 sprays into the nose daily.   Yes Historical Provider, MD  furosemide (LASIX) 40 MG tablet Take 80 mg by mouth daily. 40mg  daily, and extra if needed   Yes Historical Provider, MD  loratadine (ALLERGY) 10 MG tablet Take 10 mg by mouth daily.   Yes Historical Provider, MD  Multiple Vitamins-Minerals (MULTIVITAMINS THER. W/MINERALS) TABS Take 1 tablet by mouth every evening.    Yes Historical Provider, MD  pantoprazole (PROTONIX) 40 MG tablet Take 40 mg by mouth daily.   Yes Historical Provider, MD  predniSONE (DELTASONE) 5 MG tablet Take 10 mg by mouth every other day.    Yes Historical Provider, MD  simvastatin (ZOCOR) 10 MG tablet Take 10 mg by mouth at bedtime.   Yes Historical Provider, MD  warfarin (COUMADIN) 4 MG tablet Take 1.5 tablets (6 mg total) by mouth daily. Take 6mg  daily or as directed by physician 04/30/13  Yes Annia Belt, MD     Physical Exam:  Filed Vitals:   01/06/14 1428 01/06/14 1500 01/06/14 1600 01/06/14 1722  BP: 105/71 97/42 96/63  118/54  Pulse: 85 73 50 63  Temp:    98.5 F (36.9 C)  TempSrc:    Oral  Resp: 23   18  Height:      Weight:      SpO2: 98% 95% 96% 94%    Constitutional: Vital signs reviewed.  Elderly female in no acute distress. HEENT: no pallor, no icterus, moist oral mucosa, Cardiovascular: S1 and S2 irregularly irregular, no murmurs rub or gallop Chest: CTAB, no wheezes, rales, or rhonchi Abdominal: Soft. Non-tender, non-distended, bowel sounds are normal,  Ext: Edema with mild splitting of right mid to distal tibia with trace edema, nontender  Neurological: A&O x1, non focal  Labs on Admission:  Basic Metabolic Panel:  Recent Labs Lab 01/06/14 1125  NA 144  K 2.7*  CL 101  CO2 34*  GLUCOSE 97  BUN 9  CREATININE 0.90  CALCIUM 9.6   Liver  Function Tests:  Recent Labs Lab 01/06/14 1125  AST 16  ALT 11  ALKPHOS 110  BILITOT 0.3  PROT 5.6*  ALBUMIN 2.9*   No results found for this basename: LIPASE, AMYLASE,  in the last 168 hours No results found for this basename: AMMONIA,  in the last 168 hours CBC:  Recent Labs Lab 01/06/14 1125  WBC 7.7  HGB 10.6*  HCT 34.3*  MCV 88.4  PLT 409*   Cardiac Enzymes: No results found for this basename: CKTOTAL, CKMB, CKMBINDEX, TROPONINI,  in the last 168 hours BNP: No components found with this basename: POCBNP,  CBG: No results found for this basename: GLUCAP,  in the last  168 hours  Radiological Exams on Admission: Dg Chest 2 View  01/06/2014   CLINICAL DATA:  Lower extremity edema, atrial fibrillation and hypertension.  EXAM: CHEST - 2 VIEW  COMPARISON:  10/06/2012  FINDINGS: The heart size is normal. The aorta shows stable tortuosity. There likely is a moderate-sized hiatal hernia. Stable chronic lung disease. There is no evidence of pulmonary edema, consolidation, pneumothorax, nodule or pleural fluid. The visualized skeletal structures are unremarkable.  IMPRESSION: Stable chronic lung disease.  No acute findings.   Electronically Signed   By: Aletta Edouard M.D.   On: 01/06/2014 12:05   US Venous Img Lower Unilateral Left  01/06/2014   CLINICAL DATA:  Left leg pain and swelling  EXAM: Left LOWER EXTREMITY VENOUS DOPPLER ULTRASOUND  TECHNIQUE: Gray-scale sonography with graded compression, as well as color Doppler and duplex ultrasound were performed to evaluate the lower extremity deep venous systems from the level of the common femoral vein and including the common femoral, femoral, profunda femoral, popliteal and calf veins including the posterior tibial, peroneal and gastrocnemius veins when visible. The superficial great saphenous vein was also interrogated. Spectral Doppler was utilized to evaluate flow at rest and with distal augmentation maneuvers in the common femoral,  femoral and popliteal veins.  COMPARISON:  None.  FINDINGS: Common Femoral Vein: No evidence of thrombus. Normal compressibility, respiratory phasicity and response to augmentation.  Saphenofemoral Junction: No evidence of thrombus. Normal compressibility and flow on color Doppler imaging.  Profunda Femoral Vein: No evidence of thrombus. Normal compressibility and flow on color Doppler imaging.  Femoral Vein: No evidence of thrombus. Normal compressibility, respiratory phasicity and response to augmentation.  Popliteal Vein: No evidence of thrombus. Normal compressibility, respiratory phasicity and response to augmentation.  Calf Veins: No evidence of thrombus. Normal compressibility and flow on color Doppler imaging.  Superficial Great Saphenous Vein: No evidence of thrombus. Normal compressibility and flow on color Doppler imaging.  Venous Reflux:  None.  Other Findings:  Diffuse calf edema is identified.  IMPRESSION: Diffuse soft tissue edema.  No deep venous thrombosis is noted.   Electronically Signed   By: Inez Catalina M.D.   On: 01/06/2014 12:28    EKG: A. fib at 70, ST depression in anterolateral leads which are chronic  Assessment/Plan  principle problem Cellulitis of left lower extremity Admit to telemetry. Place on empiric vancomycin. Cellulitis resolved within next 24 hours. We'll transition her to oral doxycycline or Bactrim and discharge home if improved in the morning. -Supportive care with Tylenol when necessary and antiemetics.  -PT eval  Active Problems:  Hypokalemia Supplement with KCL 40 meq x 2. Recheck in a.m. check magnesium    Atrial fibrillation, permanent, rate controlled on coumadin Monitor on telemetry. On Coumadin with therapeutic INR. He was in to pharmacy. Continue Cardizem    CHF (congestive heart failure) Appears euvolemic. Last echo with normal EF. Continue Lasix and supplement potassium.    COPD  Continue home inhalers.  patient is on chronic low-dose  prednisone which is to be continued.  Advanced dementia Not on any medications  Iron Deficiency anemia Continue iron supplement         Diet:cardiac  DVT prophylaxis: On Coumadin   Code Status: full code Family Communication:  None at bedside Disposition Plan: Home possibly and 24-48 hours if improved  Isa Hitz, [place] [facility] Pager [phone]  Total time spent on admission :50 minutes  If 7PM-7AM, please contact night-coverage [email or web address] Password [email or web address] 01/06/2014, 5:52 PM

## 2014-01-06 NOTE — ED Notes (Signed)
Pt having left lower leg redness and swelling since yesterday.  Warm to touch.  Pulses palpable.

## 2014-01-06 NOTE — Progress Notes (Signed)
Accepted patient for transfer to [facility], tele bed.  80 year old woman with PMH of chronic atrial fibrillation on coumadin (INR 2.), patient of Dr. [name], with LLE cellulitis.  Dopplers negative for DVT.  K+2.7.  Call [place] at [phone] upon patient arrival.  RAMA,CHRISTINA 01/06/2014 1:34 PM

## 2014-01-06 NOTE — Progress Notes (Signed)
ANTICOAGULATION CONSULT NOTE - Initial Consult  Pharmacy Consult for Warfarin Indication: atrial fibrillation  Allergies  Allergen Reactions  . Amoxicillin Hives  . Other     Ragweed--wheezing, congestion, etc  . Penicillins Hives   Patient Measurements: Height: 5\' 4"  (162.6 cm) Weight: 143 lb (64.864 kg) (This morning) IBW/kg (Calculated) : 54.7  Vital Signs: Temp: 98.5 F (36.9 C) (07/06 1722) Temp src: Oral (07/06 1722) BP: 118/54 mmHg (07/06 1722) Pulse Rate: 63 (07/06 1722)  Labs:  Recent Labs  01/06/14 1125  HGB 10.6*  HCT 34.3*  PLT 409*  LABPROT 23.1*  INR 2.05*  CREATININE 0.90    Estimated Creatinine Clearance: 37.3 ml/min (by C-G formula based on Cr of 0.9).  Medical History: Past Medical History  Diagnosis Date  . Atrial fibrillation   . Hypertension   . Arthritis   . GERD (gastroesophageal reflux disease)   . COPD (chronic obstructive pulmonary disease)   . PNA (pneumonia)   . Anemia due to GI blood loss 07/11/2011  . 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 Stroke 2009    aphasia and memory impairment as residual  . Hypoxia   . Atrial fibrillation, permanent, rate controlled on coumadin 05/10/2011  . Hypogammaglobulinemia 06/01/2012    Immunoglobulins 05/01/2012-normal IgG M., normal IgA,  low IgG [phone]).   . 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 CVA history 05/10/2011  . CHF (congestive heart failure) 79/89/2119    diastolic  . History of nuclear stress test 2009    normal dobutamine myoview   Medications:  Scheduled:  . albuterol  2.5 mg Nebulization TID  . budesonide  0.5 mg Nebulization BID  . cholecalciferol  2,000 Units Oral Daily  . [START ON 01/07/2014] diltiazem  180 mg Oral QAC breakfast  . enoxaparin (LOVENOX) injection  40 mg Subcutaneous Q24H  . ferrous sulfate  325 mg Oral BID WC  . fluticasone  2 spray Each Nare Daily  . [START  ON 01/07/2014] furosemide  40 mg Oral Daily  . loratadine  10 mg Oral Daily  . multivitamin with minerals  1 tablet Oral QPM  . pantoprazole  40 mg Oral Daily  . potassium chloride  40 mEq Oral Once  . potassium chloride  40 mEq Oral Once  . predniSONE  10 mg Oral QODAY  . simvastatin  10 mg Oral QHS  . sodium chloride  3 mL Intravenous Q12H  . [START ON 01/07/2014] vancomycin  1,000 mg Intravenous Q24H  . warfarin  6 mg Oral Once  . Warfarin - Pharmacist Dosing Inpatient   Does not apply q1800    Assessment: 74 yoF with LLE swelling, redness; dopper ruled out DVT. Chronic Warfarin for Afib, home dose 6mg  daily with last dose 7/5. Admit INR = 2.05  Continue Warfarin per pharmacy  Goal of Therapy:  INR 2-3   Plan:   Warfarin 6mg  today at 1830  Daily INR  Monitor CBC, sign/sx of bleed  Minda Ditto PharmD Pager [phone] 01/06/2014, 6:29 PM

## 2014-01-06 NOTE — Progress Notes (Signed)
ANTIBIOTIC CONSULT NOTE - INITIAL  Pharmacy Consult for Vancomycin Indication: Cellulitis  Allergies  Allergen Reactions  . Amoxicillin Hives  . Other     Ragweed--wheezing, congestion, etc  . Penicillins Hives   Patient Measurements: Height: 5\' 4"  (162.6 cm) Weight: 143 lb (64.864 kg) (This morning) IBW/kg (Calculated) : 54.7  Vital Signs: Temp: 98.5 F (36.9 C) (07/06 1722) Temp src: Oral (07/06 1722) BP: 118/54 mmHg (07/06 1722) Pulse Rate: 63 (07/06 1722) Intake/Output from previous day:   Intake/Output from this shift:    Labs:  Recent Labs  01/06/14 1125  WBC 7.7  HGB 10.6*  PLT 409*  CREATININE 0.90   Estimated Creatinine Clearance: 37.3 ml/min (by C-G formula based on Cr of 0.9). No results found for this basename: VANCOTROUGH, VANCOPEAK, VANCORANDOM, GENTTROUGH, GENTPEAK, GENTRANDOM, TOBRATROUGH, TOBRAPEAK, TOBRARND, AMIKACINPEAK, AMIKACINTROU, AMIKACIN,  in the last 72 hours   Microbiology: No results found for this or any previous visit (from the past 720 hour(s)).  Medical History: Past Medical History  Diagnosis Date  . Atrial fibrillation   . Hypertension   . Arthritis   . GERD (gastroesophageal reflux disease)   . COPD (chronic obstructive pulmonary disease)   . PNA (pneumonia)   . Anemia due to GI blood loss 07/11/2011  . 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 Stroke 2009    aphasia and memory impairment as residual  . Hypoxia   . Atrial fibrillation, permanent, rate controlled on coumadin 05/10/2011  . Hypogammaglobulinemia 06/01/2012    Immunoglobulins 05/01/2012-normal IgG M., normal IgA,  low IgG [phone]).   . 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 CVA history 05/10/2011  . CHF (congestive heart failure) 46/96/2952    diastolic  . History of nuclear stress test 2009    normal dobutamine myoview   Medications:  Scheduled:  . albuterol  2.5  mg Nebulization TID  . budesonide  0.5 mg Nebulization BID  . cholecalciferol  2,000 Units Oral Daily  . [START ON 01/07/2014] diltiazem  180 mg Oral QAC breakfast  . enoxaparin (LOVENOX) injection  40 mg Subcutaneous Q24H  . ferrous sulfate  325 mg Oral BID WC  . fluticasone  2 spray Each Nare Daily  . furosemide  80 mg Oral Daily  . loratadine  10 mg Oral Daily  . multivitamins ther. w/minerals  1 tablet Oral QPM  . pantoprazole  40 mg Oral Daily  . potassium chloride  40 mEq Oral Once  . predniSONE  10 mg Oral QODAY  . simvastatin  10 mg Oral QHS  . sodium chloride  3 mL Intravenous Q12H  . [START ON 01/07/2014] vancomycin  1,000 mg Intravenous Q24H  . warfarin  6 mg Oral Once  . Warfarin - Pharmacist Dosing Inpatient   Does not apply q1800   Anti-infectives   Start     Dose/Rate Route Frequency Ordered Stop   01/07/14 1400  vancomycin (VANCOCIN) IVPB 1000 mg/200 mL premix     1,000 mg 200 mL/hr over 60 Minutes Intravenous Every 24 hours 01/06/14 1753     01/06/14 1300  vancomycin (VANCOCIN) IVPB 1000 mg/200 mL premix     1,000 mg 200 mL/hr over 60 Minutes Intravenous  Once 01/06/14 1258 01/06/14 1500     Assessment: 89 yoF with LLE swelling and redness x 1 day. Chronic Warfarin for Afib, dopplers negative for LE DVT.  Patient received Vancomycin 1gm x1 in ED, continue with Pharmacy  to dose  Goal of Therapy:  Vancomycin trough level 10-15 mcg/ml  Plan:   Vancomycin 1gm q24 hr  No cultures ordered  Minda Ditto PharmD Pager [phone] 01/06/2014, 6:23 PM

## 2014-01-06 NOTE — ED Provider Notes (Signed)
Medical screening examination/treatment/procedure(s) were performed by non-physician practitioner and as supervising physician I was immediately available for consultation/collaboration.   EKG Interpretation   Date/Time:  Monday January 06 2014 11:25:19 EDT Ventricular Rate:  70 PR Interval:    QRS Duration: 120 QT Interval:  418 QTC Calculation: 451 R Axis:   79 Text Interpretation:  Atrial fibrillation Low voltage QRS RSR' or QR  pattern in V1 suggests right ventricular conduction delay Septal infarct ,  age undetermined ST \\T \ T wave abnormality, consider inferior ischemia ST  \T\ T wave abnormality, consider anterolateral ischemia Abnormal ECG  Confirmed by Beau Fanny  MD, Weda Baumgarner ([id number]) on 01/06/2014 11:36:00 AM       Veryl Speak, MD 01/06/14 1520

## 2014-01-06 NOTE — ED Provider Notes (Signed)
CSN: [id number]     Arrival date & time 01/06/14  1057 History   First MD Initiated Contact with Patient 01/06/14 1102     Chief Complaint  Patient presents with  . Leg Swelling     (Consider location/radiation/quality/duration/timing/severity/associated sxs/prior Treatment) HPI Comments: Pt states that she started with left leg swelling and redness yesterday. Denies pain. Denies history of similar symptoms. No fever. No sob or cp. Coumadin level was checked 3 weeks ago. Denies injury to the area . No similar history to the area  The history is provided by the patient and a relative. No language interpreter was used.    Past Medical History  Diagnosis Date  . Atrial fibrillation   . Hypertension   . Arthritis   . GERD (gastroesophageal reflux disease)   . COPD (chronic obstructive pulmonary disease)   . PNA (pneumonia)   . Anemia due to GI blood loss 07/11/2011  . 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 Stroke 2009    aphasia and memory impairment as residual  . Hypoxia   . Atrial fibrillation, permanent, rate controlled on coumadin 05/10/2011  . Hypogammaglobulinemia 06/01/2012    Immunoglobulins 05/01/2012-normal IgG M., normal IgA,  low IgG 344([phone]).   . 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 CVA history 05/10/2011  . CHF (congestive heart failure) 37/62/8315    diastolic  . History of nuclear stress test 2009    normal dobutamine myoview   Past Surgical History  Procedure Laterality Date  . Hip fracture surgery    . Cholecystectomy    . Knee surgery    . Cataract extraction    . Transthoracic echocardiogram  06/2011    EF 55-60%; mild LVH; LA & RA mod dilated; mod TR   Family History  Problem Relation Age of Onset  . Heart disease Mother   . Rectal cancer Mother   . Leukemia Father   . Heart failure Mother   . Stroke Mother   . Cancer Maternal Grandmother   .  Hypertension Son   . Hypertension Daughter    History  Substance Use Topics  . Smoking status: Never Smoker   . Smokeless tobacco: Never Used  . Alcohol Use: 4.2 oz/week    7 Glasses of wine per week   OB History   Grav Para Term Preterm Abortions TAB SAB Ect Mult Living                 Review of Systems  Constitutional: Negative.   Respiratory: Negative.   Cardiovascular: Negative.   Musculoskeletal:       Leg swelling      Allergies  Amoxicillin; Other; and Penicillins  Home Medications   Prior to Admission medications   Medication Sig Start Date End Date Taking? Authorizing Provider  acetaminophen (TYLENOL) 500 MG tablet Take 1,000 mg by mouth every 6 (six) hours as needed.     Historical Provider, MD  albuterol (PROVENTIL) (2.5 MG/3ML) 0.083% nebulizer solution Take 2.5 mg by nebulization 4 (four) times daily. Takes 3 or 4 times per day    Historical Provider, MD  alendronate (FOSAMAX) 70 MG tablet Take 70 mg by mouth every 7 (seven) days. Take with a full glass of water on an empty stomach. On tuesdays    Historical Provider, MD  ALLERGY 10 MG tablet TAKE 1 TABLET BY MOUTH EVERY EVENING. 12/16/13   Collene Gobble, MD  azelastine (ASTELIN) 137  MCG/SPRAY nasal spray PLACE 2 SPRAYS INTO THE NOSE 2 (TWO) TIMES DAILY. USE IN EACH NOSTRIL AS DIRECTED 08/19/13   Collene Gobble, MD  budesonide (PULMICORT) 0.5 MG/2ML nebulizer solution Take 0.5 mg by nebulization 2 (two) times daily.    Historical Provider, MD  Cholecalciferol (VITAMIN D) 2000 UNITS CAPS Take 1 capsule by mouth daily.     Historical Provider, MD  diltiazem (DILACOR XR) 180 MG 24 hr capsule Take 180 mg by mouth daily before breakfast.     Historical Provider, MD  ferrous sulfate 325 (65 FE) MG tablet Take 65 mg by mouth. 2 tabs daily    Historical Provider, MD  fluticasone (FLONASE) 50 MCG/ACT nasal spray Place 2 sprays into the nose daily.    Historical Provider, MD  fluticasone (FLONASE) 50 MCG/ACT nasal spray  INSTILL 2 SPRAYS INTO THE NOSE 2 (TWO) TIMES DAILY. 11/05/13   Collene Gobble, MD  furosemide (LASIX) 40 MG tablet Take 80 mg by mouth daily. 40mg  daily, and extra if needed    Historical Provider, MD  guaiFENesin (MUCINEX) 600 MG 12 hr tablet Take 600 mg by mouth 2 (two) times daily as needed for congestion.    Historical Provider, MD  Multiple Vitamins-Minerals (MULTIVITAMINS THER. W/MINERALS) TABS Take 1 tablet by mouth every evening.     Historical Provider, MD  pantoprazole (PROTONIX) 40 MG tablet Take 40 mg by mouth daily.    Historical Provider, MD  polyethylene glycol (MIRALAX / GLYCOLAX) packet Take 17 g by mouth daily as needed. 1/2 capful 2-3 times week    Historical Provider, MD  predniSONE (DELTASONE) 5 MG tablet Take 10 mg by mouth every other day.     Historical Provider, MD  simvastatin (ZOCOR) 10 MG tablet Take 10 mg by mouth at bedtime.    Historical Provider, MD  warfarin (COUMADIN) 4 MG tablet Take 1.5 tablets (6 mg total) by mouth daily. Take 6mg  daily or as directed by physician 04/30/13   Annia Belt, MD   BP 121/75  Pulse 77  Temp(Src) 98.8 F (37.1 C) (Oral)  Resp 18  Ht 5\' 4"  (1.626 m)  Wt 143 lb (64.864 kg)  BMI 24.53 kg/m2  SpO2 94% Physical Exam  Nursing note and vitals reviewed. Constitutional: She appears well-developed and well-nourished.  Cardiovascular: Normal rate and regular rhythm.   Pulmonary/Chest: Effort normal and breath sounds normal.  Abdominal: Soft. Bowel sounds are normal. There is no tenderness.  Musculoskeletal: Normal range of motion.  Neurological: She is alert. Coordination normal.  Some expressive aphasia  Skin:  Swelling redness and warmth to the left lower leg. Pulses intact  Psychiatric: She has a normal mood and affect.    ED Course  Procedures (including critical care time) Labs Review Labs Reviewed  PROTIME-INR - Abnormal; Notable for the following:    Prothrombin Time 23.1 (*)    INR 2.05 (*)    All other  components within normal limits  CBC - Abnormal; Notable for the following:    Hemoglobin 10.6 (*)    HCT 34.3 (*)    RDW 17.2 (*)    Platelets 409 (*)    All other components within normal limits  COMPREHENSIVE METABOLIC PANEL - Abnormal; Notable for the following:    Potassium 2.7 (*)    CO2 34 (*)    Total Protein 5.6 (*)    Albumin 2.9 (*)    GFR calc non Af Amer 55 (*)    GFR calc Af  Amer 39 (*)    All other components within normal limits    Imaging Review Dg Chest 2 View  01/06/2014   CLINICAL DATA:  Lower extremity edema, atrial fibrillation and hypertension.  EXAM: CHEST - 2 VIEW  COMPARISON:  10/06/2012  FINDINGS: The heart size is normal. The aorta shows stable tortuosity. There likely is a moderate-sized hiatal hernia. Stable chronic lung disease. There is no evidence of pulmonary edema, consolidation, pneumothorax, nodule or pleural fluid. The visualized skeletal structures are unremarkable.  IMPRESSION: Stable chronic lung disease.  No acute findings.   Electronically Signed   By: Aletta Edouard M.D.   On: 01/06/2014 12:05   US Venous Img Lower Unilateral Left  01/06/2014   CLINICAL DATA:  Left leg pain and swelling  EXAM: Left LOWER EXTREMITY VENOUS DOPPLER ULTRASOUND  TECHNIQUE: Gray-scale sonography with graded compression, as well as color Doppler and duplex ultrasound were performed to evaluate the lower extremity deep venous systems from the level of the common femoral vein and including the common femoral, femoral, profunda femoral, popliteal and calf veins including the posterior tibial, peroneal and gastrocnemius veins when visible. The superficial great saphenous vein was also interrogated. Spectral Doppler was utilized to evaluate flow at rest and with distal augmentation maneuvers in the common femoral, femoral and popliteal veins.  COMPARISON:  None.  FINDINGS: Common Femoral Vein: No evidence of thrombus. Normal compressibility, respiratory phasicity and response to  augmentation.  Saphenofemoral Junction: No evidence of thrombus. Normal compressibility and flow on color Doppler imaging.  Profunda Femoral Vein: No evidence of thrombus. Normal compressibility and flow on color Doppler imaging.  Femoral Vein: No evidence of thrombus. Normal compressibility, respiratory phasicity and response to augmentation.  Popliteal Vein: No evidence of thrombus. Normal compressibility, respiratory phasicity and response to augmentation.  Calf Veins: No evidence of thrombus. Normal compressibility and flow on color Doppler imaging.  Superficial Great Saphenous Vein: No evidence of thrombus. Normal compressibility and flow on color Doppler imaging.  Venous Reflux:  None.  Other Findings:  Diffuse calf edema is identified.  IMPRESSION: Diffuse soft tissue edema.  No deep venous thrombosis is noted.   Electronically Signed   By: Inez Catalina M.D.   On: 01/06/2014 12:28     EKG Interpretation   Date/Time:  Monday January 06 2014 11:25:19 EDT Ventricular Rate:  70 PR Interval:    QRS Duration: 120 QT Interval:  418 QTC Calculation: 451 R Axis:   79 Text Interpretation:  Atrial fibrillation Low voltage QRS RSR' or QR  pattern in V1 suggests right ventricular conduction delay Septal infarct ,  age undetermined ST \\T \ T wave abnormality, consider inferior ischemia ST  \T\ T wave abnormality, consider anterolateral ischemia Abnormal ECG  Confirmed by DELOS  MD, DOUGLAS ([id number]) on 01/06/2014 11:36:00 AM      MDM   Final diagnoses:  Cellulitis of left lower extremity  Hypokalemia    Pt being potasium replaced and the vancomycin for infection. Pt is to be admitted. dvt negative for clot    Glendell Docker, NP 01/06/14 1316

## 2014-01-07 LAB — BASIC METABOLIC PANEL
Anion gap: 11 (ref 5–15)
BUN: 9 mg/dL (ref 6–23)
CO2: 30 mEq/L (ref 19–32)
Calcium: 8.8 mg/dL (ref 8.4–10.5)
Chloride: 100 mEq/L (ref 96–112)
Creatinine, Ser: 0.95 mg/dL (ref 0.50–1.10)
GFR calc Af Amer: 60 mL/min — ABNORMAL LOW (ref >90)
GFR, EST NON AFRICAN AMERICAN: 52 mL/min — AB (ref >90)
GLUCOSE: 140 mg/dL — AB (ref 70–99)
POTASSIUM: 3.6 meq/L — AB (ref 3.7–5.3)
SODIUM: 141 meq/L (ref 137–147)

## 2014-01-07 LAB — PROTIME-INR
INR: 2.16 — ABNORMAL HIGH (ref 0.00–1.49)
Prothrombin Time: 24.1 seconds — ABNORMAL HIGH (ref 11.6–15.2)

## 2014-01-07 MED ORDER — WARFARIN SODIUM 6 MG PO TABS
6.0000 mg | ORAL_TABLET | Freq: Once | ORAL | Status: AC
  Administered 2014-01-07: 6 mg via ORAL
  Filled 2014-01-07: qty 1

## 2014-01-07 MED ORDER — POTASSIUM CHLORIDE CRYS ER 20 MEQ PO TBCR
30.0000 meq | EXTENDED_RELEASE_TABLET | Freq: Once | ORAL | Status: AC
  Administered 2014-01-07: 30 meq via ORAL
  Filled 2014-01-07: qty 1

## 2014-01-07 NOTE — Progress Notes (Signed)
[place] for Warfarin Indication: atrial fibrillation  Allergies  Allergen Reactions  . Amoxicillin Hives  . Other     Ragweed--wheezing, congestion, etc  . Penicillins Hives   Patient Measurements: Height: 5\' 4"  (162.6 cm) Weight: 143 lb (64.864 kg) (This morning) IBW/kg (Calculated) : 54.7  Vital Signs: Temp: 98 F (36.7 C) (07/07 0609) Temp src: Oral (07/07 0609) BP: 124/65 mmHg (07/07 0609) Pulse Rate: 76 (07/07 0609)  Labs:  Recent Labs  01/06/14 1125 01/07/14 0438  HGB 10.6*  --   HCT 34.3*  --   PLT 409*  --   LABPROT 23.1* 24.1*  INR 2.05* 2.16*  CREATININE 0.90 0.95    Estimated Creatinine Clearance: 35.3 ml/min (by C-G formula based on Cr of 0.95).   Assessment: 27 yoF admitted 7/6 with cellulitis of LLE.  Presents with LLE swelling, redness; dopper ruled out DVT.  PMH includes chronic Warfarin for Afib.  Home dose was 6mg  daily with last dose 7/5.  INR on admission is therapeutic.  Pharmacy consulted to continue warfarin dosing inpatient.   INR 2.16  CBC: Hgb 10.6 and Plt 409 (01/06/14)  No major drug-drug interactions noted    Goal of Therapy:  INR 2-3   Plan:   Warfarin 6mg  PO once at 1800  Daily INR  Monitor CBC, sign/sx of bleed   Gretta Arab PharmD, BCPS Pager [phone] 01/07/2014 8:42 AM

## 2014-01-07 NOTE — Progress Notes (Signed)
Nutrition Brief Note  Patient identified on the Malnutrition Screening Tool (MST) Report  Wt Readings from Last 15 Encounters:  01/06/14 143 lb (64.864 kg)  08/06/13 145 lb 8 oz (65.998 kg)  07/16/13 152 lb 8 oz (69.174 kg)  02/12/13 153 lb 9.6 oz (69.673 kg)  01/08/13 153 lb 12.8 oz (69.763 kg)  10/11/12 150 lb 2.1 oz (68.1 kg)  07/31/12 158 lb (71.668 kg)  07/10/12 162 lb 6.4 oz (73.664 kg)  06/05/12 165 lb 9.6 oz (75.116 kg)  06/01/12 163 lb 6.4 oz (74.118 kg)  05/01/12 161 lb 12.8 oz (73.392 kg)  03/09/12 157 lb (71.215 kg)  02/02/12 150 lb 3.2 oz (68.13 kg)  01/09/12 153 lb 1.6 oz (69.446 kg)  11/17/11 156 lb 6.4 oz (70.943 kg)    Body mass index is 24.53 kg/(m^2). Patient meets criteria for Normal weight based on current BMI.   Current diet order is Heart healthy, patient is consuming approximately 50-75% of meals at this time. Labs and medications reviewed.   Pt denied any changes in appetite pta, has been receiving meal ordering assistance from RN. Did not have any questions regarding current heart healthy diet order. Pt denied any unintentional wt loss; however may have lost 9-10 lbs in past 6 month per previous medical records (5.9% body weight loss, non-severe for time frame)  No nutrition interventions warranted at this time. If nutrition issues arise, please consult RD.   Atlee Abide MS RD LDN Clinical Dietitian [phone]

## 2014-01-07 NOTE — Evaluation (Signed)
Physical Therapy Evaluation Patient Details Name: Angelica [name] MRN: [id number] DOB: 1923/06/08 Today's Date: 01/07/2014   History of Present Illness  80 yo female admitted with L LE swelling/cellulitis, Afib. hx of A fib, COPD, CVA, HTN, CHF, dementia, aphasia.   Clinical Impression  On eval, pt required Min assist for mobility-able to ambulate ~200 feet with walker. Unsteady at times, especially without external assist/walker. No family present during session-unsure of assist available at home.     Follow Up Recommendations Home health PT;Supervision/Assistance - 24 hour    Equipment Recommendations  None recommended by PT    Recommendations for Other Services OT consult     Precautions / Restrictions Precautions Precautions: Fall Restrictions Weight Bearing Restrictions: No      Mobility  Bed Mobility Overal bed mobility: Needs Assistance Bed Mobility: Supine to Sit;Sit to Supine     Supine to sit: Supervision Sit to supine: Supervision      Transfers Overall transfer level: Needs assistance Equipment used: 1 person hand held assist Transfers: Sit to/from Stand Sit to Stand: Min assist         General transfer comment: assist to rise, stabilize.   Ambulation/Gait Ambulation/Gait assistance: Min assist Ambulation Distance (Feet): 200 Feet Assistive device: Rolling walker (2 wheeled) Gait Pattern/deviations: Step-through pattern;Drifts right/left     General Gait Details: intermittent assist to steady. VCs distance from RW.   Stairs            Wheelchair Mobility    Modified Rankin (Stroke Patients Only)       Balance Overall balance assessment: Needs assistance         Standing balance support: During functional activity;No upper extremity supported Standing balance-Leahy Scale: Fair                               Pertinent Vitals/Pain Pt denied pain    Home Living Family/patient expects to be discharged to:: Private  residence Living Arrangements: Spouse/significant other Available Help at Discharge: Family Type of Home: House Home Access: Stairs to enter Entrance Stairs-Rails: Right Entrance Stairs-Number of Steps: 3 Home Layout: Able to live on main level with bedroom/bathroom Home Equipment: Walker - 4 wheels Additional Comments: some difficulty providing history-no family present    Prior Function           Comments: pt states she has a rollator at home     Hand Dominance        Extremity/Trunk Assessment   Upper Extremity Assessment: Generalized weakness           Lower Extremity Assessment: Generalized weakness      Cervical / Trunk Assessment: Kyphotic  Communication   Communication: Expressive difficulties  Cognition Arousal/Alertness: Awake/alert Behavior During Therapy: WFL for tasks assessed/performed Overall Cognitive Status: History of cognitive impairments - at baseline                      General Comments      Exercises        Assessment/Plan    PT Assessment Patient needs continued PT services  PT Diagnosis Difficulty walking;Generalized weakness   PT Problem List Decreased mobility;Decreased balance;Decreased activity tolerance;Decreased cognition;Decreased knowledge of use of DME;Decreased strength  PT Treatment Interventions DME instruction;Gait training;Functional mobility training;Therapeutic activities;Therapeutic exercise;Patient/family education;Balance training   PT Goals (Current goals can be found in the Care Plan section) Acute Rehab PT Goals Patient Stated Goal: none stated PT Goal  Formulation: Patient unable to participate in goal setting Time For Goal Achievement: 01/21/14 Potential to Achieve Goals: Good    Frequency Min 3X/week   Barriers to discharge        Co-evaluation               End of Session Equipment Utilized During Treatment: Gait belt Activity Tolerance: Patient tolerated treatment well Patient  left: in bed;with call bell/phone within reach;with bed alarm set           Time: 6967-8938 PT Time Calculation (min): 17 min   Charges:   PT Evaluation $Initial PT Evaluation Tier I: 1 Procedure PT Treatments $Gait Training: 8-22 mins   PT G Codes:          Weston Anna, MPT Pager: [phone]

## 2014-01-07 NOTE — Care Management Note (Addendum)
    Page 1 of 1   01/08/2014     3:27:55 PM CARE MANAGEMENT NOTE 01/08/2014  Patient:  Angelica [name], Angelica [name]   Account Number:  [id number]  Date Initiated:  01/07/2014  Documentation initiated by:  Dessa Phi  Subjective/Objective Assessment:   80 Y/O F ADMITTED W/L LEG CELLULITIS.     Action/Plan:   FROM HOME W/DAUGHTER.HAS CANE,RW,3N1.HAS PCP,PHARMACY.   Anticipated DC Date:  01/08/2014   Anticipated DC Plan:  [place]  CM consult      Choice offered to / List presented to:  C-4 Adult Children        HH arranged  HH-2 PT      [place].   Status of service:  Completed, signed off Medicare Important Message given?   (If response is "NO", the following Medicare IM given date fields will be blank) Date Medicare IM given:   Medicare IM given by:   Date Additional Medicare IM given:   Additional Medicare IM given by:    Discharge Disposition:  [place]  Per UR Regulation:  Reviewed for med. necessity/level of care/duration of stay  If discussed at [place] of Stay Meetings, dates discussed:    Comments:  01/08/14 Dametri Ozburn RN,BSN NCM [address] [place].TC DTR LINDA MARGO [phone] CHOSE [facility] FOR HHPT.TC [facility] REP STEPHANIE AWARE OF REFERRAL,& HHPT ORDER.  01/07/14 Tatelyn Vanhecke RN,BSN NCM [phone] PT-HH.PROVIDED [facility] AGENCY LIST.AWAIT CHOICE.

## 2014-01-07 NOTE — Progress Notes (Signed)
PROGRESS NOTE  Angelica [name] WJX:[id number] DOB: 1922/08/29 DOA: 01/06/2014 PCP: [name], MD  HPI: 80 year old female with severe dementia , A. fib on Coumadin, history of CHF, hypertension, COPD, iron deficiency anemia, GERD, [place] noticing left leg swelling and redness since yesterday. Patient has CVA dementia . I called her husband who was unable to provide much history and I was unable to reach her daughter Angelica [name] on the phone. No hx of headache, dizziness, fever, chills, nausea , vomiting, chest pain, palpitations, SOB, abdominal pain, bowel or urinary symptoms. No history of injury to the area.  Indeed the patient had normal vitals. Blood work done showed hemoglobin of 10.6, hematocrit 34.3 and platelets of 409. She showed sodium of 144, K. of 2.7, chloride of 101, CO2 of 34, normal renal function and INR of 2.  Doppler of left lower extremity was negative for DVT. Chest x-ray was unremarkable.  Given a dose of IV vancomycin and transferred to [facility] for management of cellulitis of her left leg.   Assessment/Plan: LLE Cellulitis - improving, still erythematous and tender, I think she needs one more day of IV antibiotics and transition to oral in am. PT to evaluate whether she can go back home.  Hypokalemia - s/p supplementation on admission, repeat in am Atrial fibrillation, permanent, rate controlled on coumadin Monitor on telemetry, no events. On Coumadin per pharmacy. Continue Cardizem  CHF (congestive heart failure) Appears euvolemic. Last echo with normal EF. Continue Lasix and supplement potassium.  COPD Continue home inhalers. patient is on chronic low-dose prednisone which is to be continued.  Advanced dementia Not on any medications  Iron Deficiency anemia Continue iron supplement  Diet: heart Fluids: none DVT Prophylaxis: coumadin  Code Status: Full Family Communication: will d/w daughter this afternoon, plans  to come ~1:30 Disposition Plan: home when ready, likely 24h  Consultants:  None   Procedures:  None    Antibiotics  Anti-infectives   Start     Dose/Rate Route Frequency Ordered Stop   01/07/14 1400  vancomycin (VANCOCIN) IVPB 1000 mg/200 mL premix     1,000 mg 200 mL/hr over 60 Minutes Intravenous Every 24 hours 01/06/14 1753     01/06/14 1300  vancomycin (VANCOCIN) IVPB 1000 mg/200 mL premix     1,000 mg 200 mL/hr over 60 Minutes Intravenous  Once 01/06/14 1258 01/06/14 1500     Antibiotics Given (last 72 hours)   None      HPI/Subjective: - no complaints, confused, alert  Objective: Filed Vitals:   01/06/14 1722 01/06/14 2045 01/06/14 2055 01/07/14 0609  BP: 118/54  104/58 124/65  Pulse: 63  68 76  Temp: 98.5 F (36.9 C)  98 F (36.7 C) 98 F (36.7 C)  TempSrc: Oral  Oral Oral  Resp: 18  18 18   Height:      Weight:      SpO2: 94% 90% 99% 96%   No intake or output data in the 24 hours ending 01/07/14 0720 Filed Weights   01/06/14 1105  Weight: 64.864 kg (143 lb)    Exam:   General:  NAD  Cardiovascular: irregular  Respiratory: good air movement, clear to auscultation throughout, no wheezing, ronchi or rales  Abdomen: soft, not tender to palpation, positive bowel sounds  MSK: no peripheral edema, LLE with mild erythematous area 2x3 cm, tender to palpation  Neuro: non focal  Data Reviewed: Basic Metabolic Panel:  Recent Labs Lab 01/06/14  1125 01/06/14 1832  NA 144  --   K 2.7*  --   CL 101  --   CO2 34*  --   GLUCOSE 97  --   BUN 9  --   CREATININE 0.90  --   CALCIUM 9.6  --   MG  --  1.8   Liver Function Tests:  Recent Labs Lab 01/06/14 1125  AST 16  ALT 11  ALKPHOS 110  BILITOT 0.3  PROT 5.6*  ALBUMIN 2.9*   CBC:  Recent Labs Lab 01/06/14 1125  WBC 7.7  HGB 10.6*  HCT 34.3*  MCV 88.4  PLT 409*   Studies: Dg Chest 2 View  01/06/2014   CLINICAL DATA:  Lower extremity edema, atrial fibrillation and  hypertension.  EXAM: CHEST - 2 VIEW  COMPARISON:  10/06/2012  FINDINGS: The heart size is normal. The aorta shows stable tortuosity. There likely is a moderate-sized hiatal hernia. Stable chronic lung disease. There is no evidence of pulmonary edema, consolidation, pneumothorax, nodule or pleural fluid. The visualized skeletal structures are unremarkable.  IMPRESSION: Stable chronic lung disease.  No acute findings.   Electronically Signed   By: Aletta Edouard M.D.   On: 01/06/2014 12:05   US Venous Img Lower Unilateral Left  01/06/2014   CLINICAL DATA:  Left leg pain and swelling  EXAM: Left LOWER EXTREMITY VENOUS DOPPLER ULTRASOUND  TECHNIQUE: Gray-scale sonography with graded compression, as well as color Doppler and duplex ultrasound were performed to evaluate the lower extremity deep venous systems from the level of the common femoral vein and including the common femoral, femoral, profunda femoral, popliteal and calf veins including the posterior tibial, peroneal and gastrocnemius veins when visible. The superficial great saphenous vein was also interrogated. Spectral Doppler was utilized to evaluate flow at rest and with distal augmentation maneuvers in the common femoral, femoral and popliteal veins.  COMPARISON:  None.  FINDINGS: Common Femoral Vein: No evidence of thrombus. Normal compressibility, respiratory phasicity and response to augmentation.  Saphenofemoral Junction: No evidence of thrombus. Normal compressibility and flow on color Doppler imaging.  Profunda Femoral Vein: No evidence of thrombus. Normal compressibility and flow on color Doppler imaging.  Femoral Vein: No evidence of thrombus. Normal compressibility, respiratory phasicity and response to augmentation.  Popliteal Vein: No evidence of thrombus. Normal compressibility, respiratory phasicity and response to augmentation.  Calf Veins: No evidence of thrombus. Normal compressibility and flow on color Doppler imaging.  Superficial Great  Saphenous Vein: No evidence of thrombus. Normal compressibility and flow on color Doppler imaging.  Venous Reflux:  None.  Other Findings:  Diffuse calf edema is identified.  IMPRESSION: Diffuse soft tissue edema.  No deep venous thrombosis is noted.   Electronically Signed   By: Inez Catalina M.D.   On: 01/06/2014 12:28    Scheduled Meds: . albuterol  2.5 mg Nebulization TID  . budesonide  0.5 mg Nebulization BID  . cholecalciferol  2,000 Units Oral Daily  . diltiazem  180 mg Oral QAC breakfast  . ferrous sulfate  325 mg Oral BID WC  . fluticasone  2 spray Each Nare Daily  . furosemide  40 mg Oral Daily  . loratadine  10 mg Oral Daily  . multivitamin with minerals  1 tablet Oral QPM  . pantoprazole  40 mg Oral Daily  . predniSONE  10 mg Oral QODAY  . simvastatin  10 mg Oral QHS  . sodium chloride  3 mL Intravenous Q12H  . vancomycin  1,000 mg Intravenous Q24H  . 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 Any transcriptional errors are unintentional.   Marzetta Board, MD [facility] Pager [phone]. If 7 PM - 7 AM, please contact night-coverage at [email or web address], password [facility] 01/07/2014, 7:20 AM  LOS: 1 day

## 2014-01-08 DIAGNOSIS — I4891 Unspecified atrial fibrillation: Secondary | ICD-10-CM

## 2014-01-08 DIAGNOSIS — F0390 Unspecified dementia without behavioral disturbance: Secondary | ICD-10-CM

## 2014-01-08 LAB — CBC
HCT: 31.1 % — ABNORMAL LOW (ref 36.0–46.0)
Hemoglobin: 9.4 g/dL — ABNORMAL LOW (ref 12.0–15.0)
MCH: 26.9 pg (ref 26.0–34.0)
MCHC: 30.2 g/dL (ref 30.0–36.0)
MCV: 88.9 fL (ref 78.0–100.0)
PLATELETS: 355 10*3/uL (ref 150–400)
RBC: 3.5 MIL/uL — AB (ref 3.87–5.11)
RDW: 17.2 % — ABNORMAL HIGH (ref 11.5–15.5)
WBC: 8.6 10*3/uL (ref 4.0–10.5)

## 2014-01-08 LAB — PROTIME-INR
INR: 2.33 — AB (ref 0.00–1.49)
PROTHROMBIN TIME: 25.6 s — AB (ref 11.6–15.2)

## 2014-01-08 LAB — POTASSIUM: POTASSIUM: 3 meq/L — AB (ref 3.7–5.3)

## 2014-01-08 MED ORDER — POTASSIUM CHLORIDE ER 10 MEQ PO TBCR: 10.0000 meq | EXTENDED_RELEASE_TABLET | Freq: Every day | ORAL | Status: DC

## 2014-01-08 MED ORDER — WARFARIN SODIUM 6 MG PO TABS
6.0000 mg | ORAL_TABLET | Freq: Once | ORAL | Status: DC
  Filled 2014-01-08: qty 1

## 2014-01-08 MED ORDER — DOXYCYCLINE HYCLATE 100 MG PO TABS: 100.0000 mg | ORAL_TABLET | Freq: Two times a day (BID) | ORAL | Status: DC

## 2014-01-08 MED ORDER — POTASSIUM CHLORIDE CRYS ER 20 MEQ PO TBCR
60.0000 meq | EXTENDED_RELEASE_TABLET | Freq: Once | ORAL | Status: AC
  Administered 2014-01-08: 60 meq via ORAL
  Filled 2014-01-08: qty 3

## 2014-01-08 NOTE — Progress Notes (Signed)
Physical Therapy Treatment Patient Details Name: Angelica [name] MRN: [id number] DOB: 29-Jan-1923 Today's Date: 01/08/2014    History of Present Illness 80 yo female admitted with L LE swelling/cellulitis, Afib. hx of A fib, COPD, CVA, HTN, CHF, dementia, aphasia.     PT Comments    Pt is confused. Participated well with therapy. HR into 120s with ambulation. No family present during session. Note pt's husband admitted to hospital as well.   Follow Up Recommendations  Home health PT;Supervision/Assistance - 24 hour     Equipment Recommendations  None recommended by PT    Recommendations for Other Services OT consult     Precautions / Restrictions Precautions Precautions: Fall Restrictions Weight Bearing Restrictions: No    Mobility  Bed Mobility Overal bed mobility: Needs Assistance Bed Mobility: Supine to Sit;Sit to Supine     Supine to sit: Supervision Sit to supine: Supervision   General bed mobility comments: Increased time.   Transfers Overall transfer level: Needs assistance Equipment used: Rolling walker (2 wheeled) Transfers: Sit to/from Stand Sit to Stand: Min guard         General transfer comment: Multiple attempts for pt to rise to standing with assistance from low surface. Min guard assist.  Ambulation/Gait Ambulation/Gait assistance: Min guard Ambulation Distance (Feet): 300 Feet Assistive device: Rolling walker (2 wheeled) Gait Pattern/deviations: Step-through pattern     General Gait Details: VCs distance from RW. close guard for safety   Stairs            Wheelchair Mobility    Modified Rankin (Stroke Patients Only)       Balance                                    Cognition Arousal/Alertness: Awake/alert Behavior During Therapy: WFL for tasks assessed/performed Overall Cognitive Status: History of cognitive impairments - at baseline                      Exercises      General Comments         Pertinent Vitals/Pain No c/o pain    Home Living                      Prior Function            PT Goals (current goals can now be found in the care plan section) Progress towards PT goals: Progressing toward goals    Frequency  Min 3X/week    PT Plan Current plan remains appropriate    Co-evaluation             End of Session Equipment Utilized During Treatment: Gait belt Activity Tolerance: Patient tolerated treatment well Patient left: in bed;with call bell/phone within reach;with bed alarm set     Time: 0927-0950 PT Time Calculation (min): 23 min  Charges:  $Gait Training: 23-37 mins                    G Codes:      Weston Anna, MPT Pager: [phone]

## 2014-01-08 NOTE — Discharge Instructions (Signed)
Please follow up with primary care doctor in 1 week  °

## 2014-01-08 NOTE — Discharge Summary (Signed)
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 fib on Coumadin, history of CHF, hypertension, COPD, iron deficiency anemia, GERD, [place] noticing left leg swelling and redness found to have cellulitis    Hospital Course:   1. LLE Cellulitis significantly improved on IV antibiotics -afebrile, no leukocytosis; atx changed to PO to complete outpatient treatment course  2. Hypokalemia - likely diuretic related; Pt was refusing to take potassium at home; replaced in the hospital;  -d/w patient, her daughter; reemphasized patient importance of potassium; cont PO potassiummrecheck K in 5-7 days  3. Atrial fibrillation, permanent, cont home regimen; stable  4. CHF (congestive heart failure) Appears euvolemic. Last echo with normal EF. Continue Lasix and supplement potassium.  5. COPD Continue home inhalers. patient is on chronic low-dose prednisone which is to be continued.  6. Advanced dementia Not on any medications  7. Iron Deficiency anemia Continue iron supplement; no s/s of acute bleeding    Procedures:  none (i.e. Studies not automatically included, echos, thoracentesis, etc; not  x-rays)  Consultations:  none  Discharge Exam: Filed Vitals:   01/08/14 0645  BP: 124/56  Pulse: 64  Temp: 98.4 F (36.9 C)  Resp: 18    General: alert Cardiovascular: s1,s2 rrr Respiratory: CTA BL  Discharge Instructions  Discharge Instructions   Diet - low sodium heart healthy    Complete by:  As directed      Discharge instructions    Complete by:  As directed   Please follow up with primary care doctor in 1 week     Increase activity slowly    Complete by:  As directed             Medication List         acetaminophen 500 MG tablet  Commonly known as:  TYLENOL  Take 1,000 mg by mouth every 6 (six) hours as needed.     albuterol (2.5 MG/3ML) 0.083% nebulizer solution  Commonly known as:  PROVENTIL  Take 2.5 mg by nebulization 3 (three) times daily.     alendronate 70 MG tablet  Commonly known as:  FOSAMAX  Take 70 mg by mouth every 7 (seven) days. Take with a full glass of water on an empty stomach. On tuesdays     ALLERGY 10 MG tablet  Generic drug:  loratadine  Take 10 mg by mouth daily.     budesonide 0.5 MG/2ML nebulizer solution  Commonly known as:  PULMICORT  Take 0.5 mg by nebulization 2 (two) times daily.     diltiazem 180 MG 24 hr capsule  Commonly known as:  DILACOR XR  Take 180  mg by mouth daily before breakfast.     doxycycline 100 MG tablet  Commonly known as:  VIBRA-TABS  Take 1 tablet (100 mg total) by mouth 2 (two) times daily.     ferrous sulfate 325 (65 FE) MG tablet  Take 325 mg by mouth 2 (two) times daily with a meal.     fluticasone 50 MCG/ACT nasal spray  Commonly known as:  FLONASE  Place 2 sprays into the nose daily.     furosemide 40 MG tablet  Commonly known as:  LASIX  Take 40-80 mg by mouth daily. 40mg  daily, and takes an extra 40mg  if needed for swelling.     multivitamins ther. w/minerals Tabs tablet  Take 1 tablet by mouth every evening.     pantoprazole 40 MG tablet  Commonly known as:  PROTONIX  Take  40 mg by mouth daily.     potassium chloride 10 MEQ tablet  Commonly known as:  K-DUR  Take 1 tablet (10 mEq total) by mouth daily.     predniSONE 5 MG tablet  Commonly known as:  DELTASONE  Take 10 mg by mouth every other day.     simvastatin 10 MG tablet  Commonly known as:  ZOCOR  Take 10 mg by mouth at bedtime.     Vitamin D 2000 UNITS Caps  Take 2,000 Units by mouth daily.     warfarin 4 MG tablet  Commonly known as:  COUMADIN  Take 1.5 tablets (6 mg total) by mouth daily. Take 6mg  daily or as directed by physician       Allergies  Allergen Reactions  . Amoxicillin Hives  . Other     Ragweed--wheezing, congestion, etc  . Penicillins Hives       Follow-up Information   Follow up with [name], MD. Schedule an appointment as soon as possible for a visit in 1 week.   Specialty:  Internal Medicine   Contact information:   [address] [place] [address] [phone]        The results of significant diagnostics from this hospitalization (including imaging, microbiology, ancillary and laboratory) are listed below for reference.    Significant Diagnostic Studies: Dg Chest 2 View  01/06/2014   CLINICAL DATA:  Lower extremity edema, atrial fibrillation and hypertension.  EXAM: CHEST - 2 VIEW  COMPARISON:  10/06/2012  FINDINGS: The heart size is normal. The aorta shows stable tortuosity. There likely is a moderate-sized hiatal hernia. Stable chronic lung disease. There is no evidence of pulmonary edema, consolidation, pneumothorax, nodule or pleural fluid. The visualized skeletal structures are unremarkable.  IMPRESSION: Stable chronic lung disease.  No acute findings.   Electronically Signed   By: Aletta Edouard M.D.   On: 01/06/2014 12:05   US Venous Img Lower Unilateral Left  01/06/2014   CLINICAL DATA:  Left leg pain and swelling  EXAM: Left LOWER EXTREMITY VENOUS DOPPLER ULTRASOUND  TECHNIQUE: Gray-scale sonography with graded  compression, as well as color Doppler and duplex ultrasound were performed to evaluate the lower extremity deep venous systems from the level of the common femoral vein and including the common femoral, femoral, profunda femoral, popliteal and calf veins including the posterior tibial, peroneal and gastrocnemius veins when visible. The superficial great saphenous vein was also interrogated. Spectral Doppler was utilized to evaluate flow at rest and with distal augmentation maneuvers in the common femoral, femoral and popliteal veins.  COMPARISON:  None.  FINDINGS: Common Femoral Vein: No evidence of  thrombus. Normal compressibility, respiratory phasicity and response to augmentation.  Saphenofemoral Junction: No evidence of thrombus. Normal compressibility and flow on color Doppler imaging.  Profunda Femoral Vein: No evidence of thrombus. Normal compressibility and flow on color Doppler imaging.  Femoral Vein: No evidence of thrombus. Normal compressibility, respiratory phasicity and response to augmentation.  Popliteal Vein: No evidence of thrombus. Normal compressibility, respiratory phasicity and response to augmentation.  Calf Veins: No evidence of thrombus. Normal compressibility and flow on color Doppler imaging.  Superficial Great Saphenous Vein: No evidence of thrombus. Normal compressibility and flow on color Doppler imaging.  Venous Reflux:  None.  Other Findings:  Diffuse calf edema is identified.  IMPRESSION: Diffuse soft tissue edema.  No deep venous thrombosis is noted.   Electronically Signed   By: Inez Catalina M.D.   On: 01/06/2014 12:28    Microbiology: No results found for this or any previous visit (from the past 240 hour(s)).   Labs: Basic Metabolic Panel:  Recent Labs Lab 01/06/14 1125 01/06/14 1832 01/07/14 0438 01/08/14 0507  NA 144  --  141  --   K 2.7*  --  3.6* 3.0*  CL 101  --  100  --   CO2 34*  --  30  --   GLUCOSE 97  --  140*  --   BUN 9  --  9  --   CREATININE  0.90  --  0.95  --   CALCIUM 9.6  --  8.8  --   MG  --  1.8  --   --    Liver Function Tests:  Recent Labs Lab 01/06/14 1125  AST 16  ALT 11  ALKPHOS 110  BILITOT 0.3  PROT 5.6*  ALBUMIN 2.9*   No results found for this basename: LIPASE, AMYLASE,  in the last 168 hours No results found for this basename: AMMONIA,  in the last 168 hours CBC:  Recent Labs Lab 01/06/14 1125 01/08/14 0507  WBC 7.7 8.6  HGB 10.6* 9.4*  HCT 34.3* 31.1*  MCV 88.4 88.9  PLT 409* 355   Cardiac Enzymes: No results found for this basename: CKTOTAL, CKMB, CKMBINDEX, TROPONINI,  in the last 168 hours BNP: BNP (last 3 results) No results found for this basename: PROBNP,  in the last 8760 hours CBG: No results found for this basename: GLUCAP,  in the last 168 hours     Signed:  Rowe Clack N  [facility] 01/08/2014, 11:50 AM

## 2014-01-08 NOTE — Progress Notes (Signed)
[facility] for Warfarin Indication: atrial fibrillation  Allergies  Allergen Reactions  . Amoxicillin Hives  . Other     Ragweed--wheezing, congestion, etc  . Penicillins Hives   Patient Measurements: Height: 5\' 4"  (162.6 cm) Weight: 145 lb 1 oz (65.8 kg) IBW/kg (Calculated) : 54.7  Vital Signs: Temp: 98.4 F (36.9 C) (07/08 0645) Temp src: Oral (07/08 0645) BP: 124/56 mmHg (07/08 0645) Pulse Rate: 64 (07/08 0645)  Labs:  Recent Labs  01/06/14 1125 01/07/14 0438 01/08/14 0507  HGB 10.6*  --  9.4*  HCT 34.3*  --  31.1*  PLT 409*  --  355  LABPROT 23.1* 24.1* 25.6*  INR 2.05* 2.16* 2.33*  CREATININE 0.90 0.95  --     Estimated Creatinine Clearance: 38.2 ml/min (by C-G formula based on Cr of 0.95).   Assessment: 9 yoF admitted 7/6 with cellulitis of LLE.  Presents with LLE swelling, redness; dopper ruled out DVT.  PMH includes chronic Warfarin for Afib.  Home dose was 6mg  daily with last dose 7/5.  INR on admission is therapeutic.  Pharmacy consulted to continue warfarin dosing inpatient.   INR 2.33  CBC: Hgb 9.4 (decreased) and Plt 355  No major drug-drug interactions noted  No bleeding or complications reported.  Goal of Therapy:  INR 2-3   Plan:   Warfarin 6mg  PO once at 1800  Daily INR  Monitor CBC, sign/sx of bleed   Gretta Arab PharmD, BCPS Pager [phone] 01/08/2014 8:40 AM

## 2014-01-12 ENCOUNTER — Encounter (HOSPITAL_COMMUNITY): Payer: Self-pay | Admitting: Emergency Medicine

## 2014-01-12 ENCOUNTER — Emergency Department (HOSPITAL_COMMUNITY)
Admission: EM | Admit: 2014-01-12 | Discharge: 2014-01-12 | Disposition: A | Discharge status: Home / Self Care | Payer: Medicare Other | Attending: Emergency Medicine | Admitting: Emergency Medicine

## 2014-01-12 DIAGNOSIS — Z872 Personal history of diseases of the skin and subcutaneous tissue: Secondary | ICD-10-CM | POA: Insufficient documentation

## 2014-01-12 DIAGNOSIS — I10 Essential (primary) hypertension: Secondary | ICD-10-CM | POA: Insufficient documentation

## 2014-01-12 DIAGNOSIS — M25472 Effusion, left ankle: Secondary | ICD-10-CM

## 2014-01-12 DIAGNOSIS — J449 Chronic obstructive pulmonary disease, unspecified: Secondary | ICD-10-CM | POA: Insufficient documentation

## 2014-01-12 DIAGNOSIS — Z7901 Long term (current) use of anticoagulants: Secondary | ICD-10-CM | POA: Insufficient documentation

## 2014-01-12 DIAGNOSIS — Z880 Allergy status to penicillin: Secondary | ICD-10-CM | POA: Insufficient documentation

## 2014-01-12 DIAGNOSIS — I4891 Unspecified atrial fibrillation: Secondary | ICD-10-CM | POA: Insufficient documentation

## 2014-01-12 DIAGNOSIS — Z79899 Other long term (current) drug therapy: Secondary | ICD-10-CM | POA: Insufficient documentation

## 2014-01-12 DIAGNOSIS — F0390 Unspecified dementia without behavioral disturbance: Secondary | ICD-10-CM | POA: Insufficient documentation

## 2014-01-12 DIAGNOSIS — M25579 Pain in unspecified ankle and joints of unspecified foot: Secondary | ICD-10-CM | POA: Insufficient documentation

## 2014-01-12 DIAGNOSIS — D500 Iron deficiency anemia secondary to blood loss (chronic): Secondary | ICD-10-CM | POA: Insufficient documentation

## 2014-01-12 DIAGNOSIS — IMO0002 Reserved for concepts with insufficient information to code with codable children: Secondary | ICD-10-CM | POA: Insufficient documentation

## 2014-01-12 DIAGNOSIS — M7989 Other specified soft tissue disorders: Secondary | ICD-10-CM | POA: Insufficient documentation

## 2014-01-12 DIAGNOSIS — K219 Gastro-esophageal reflux disease without esophagitis: Secondary | ICD-10-CM | POA: Insufficient documentation

## 2014-01-12 DIAGNOSIS — J4489 Other specified chronic obstructive pulmonary disease: Secondary | ICD-10-CM | POA: Insufficient documentation

## 2014-01-12 DIAGNOSIS — Z8673 Personal history of transient ischemic attack (TIA), and cerebral infarction without residual deficits: Secondary | ICD-10-CM | POA: Insufficient documentation

## 2014-01-12 DIAGNOSIS — I509 Heart failure, unspecified: Secondary | ICD-10-CM | POA: Insufficient documentation

## 2014-01-12 DIAGNOSIS — Z8701 Personal history of pneumonia (recurrent): Secondary | ICD-10-CM | POA: Insufficient documentation

## 2014-01-12 DIAGNOSIS — Z8781 Personal history of (healed) traumatic fracture: Secondary | ICD-10-CM | POA: Insufficient documentation

## 2014-01-12 DIAGNOSIS — Q2733 Arteriovenous malformation of digestive system vessel: Secondary | ICD-10-CM | POA: Insufficient documentation

## 2014-01-12 DIAGNOSIS — M129 Arthropathy, unspecified: Secondary | ICD-10-CM | POA: Insufficient documentation

## 2014-01-12 LAB — BASIC METABOLIC PANEL
Anion gap: 14 (ref 5–15)
BUN: 12 mg/dL (ref 6–23)
CHLORIDE: 98 meq/L (ref 96–112)
CO2: 29 mEq/L (ref 19–32)
CREATININE: 0.88 mg/dL (ref 0.50–1.10)
Calcium: 9.4 mg/dL (ref 8.4–10.5)
GFR calc non Af Amer: 57 mL/min — ABNORMAL LOW (ref >90)
GFR, EST AFRICAN AMERICAN: 66 mL/min — AB (ref >90)
Glucose, Bld: 115 mg/dL — ABNORMAL HIGH (ref 70–99)
Potassium: 3.5 mEq/L — ABNORMAL LOW (ref 3.7–5.3)
Sodium: 141 mEq/L (ref 137–147)

## 2014-01-12 NOTE — Discharge Instructions (Signed)
Edema  Edema is an abnormal buildup of fluids. It is more common in your legs and thighs. Painless swelling of the feet and ankles is more likely as a person ages. It also is common in looser skin, like around your eyes.  HOME CARE   · Keep the affected body part above the level of the heart while lying down.  · Do not sit still or stand for a long time.  · Do not put anything right under your knees when you lie down.  · Do not wear tight clothes on your upper legs.  · Exercise your legs to help the puffiness (swelling) go down.  · Wear elastic bandages or support stockings as told by your doctor.  · A low-salt diet may help lessen the puffiness.  · Only take medicine as told by your doctor.  GET HELP IF:  · Treatment is not working.  · You have heart, liver, or kidney disease and notice that your skin looks puffy or shiny.  · You have puffiness in your legs that does not get better when you raise your legs.  · You have sudden weight gain for no reason.  GET HELP RIGHT AWAY IF:   · You have shortness of breath or chest pain.  · You cannot breathe when you lie down.  · You have pain, redness, or warmth in the areas that are puffy.  · You have heart, liver, or kidney disease and get edema all of a sudden.  · You have a fever and your symptoms get worse all of a sudden.  MAKE SURE YOU:   · Understand these instructions.  · Will watch your condition.  · Will get help right away if you are not doing well or get worse.  Document Released: 12/07/2007 Document Revised: 06/25/2013 Document Reviewed: 04/12/2013  [employer]® Patient Information ©2015 [employer]. This information is not intended to replace advice given to you by your health care provider. Make sure you discuss any questions you have with your health care provider.

## 2014-01-12 NOTE — ED Provider Notes (Signed)
CSN: [id number]     Arrival date & time 01/12/14  1037 History   First MD Initiated Contact with Patient 01/12/14 1115     Chief Complaint  Patient presents with  . Foot Swelling  . recently admitted for cellulitis      (Consider location/radiation/quality/duration/timing/severity/associated sxs/prior Treatment) HPI   80 yo female with hx of afib, HTN, COPD, and recent hospitalization with LLE cellulitis here with LLE swelling and pain. Patient has baseline swelling of left leg and has been on lasix. On 01/06/14 she came to ED beause she noticed more swelling and redness of the left leg. She was admitted for cellulitis and discharge on 01/08/14 with doxycycline. During that time she did not have tenderness of the leg. Today her swelling has increased on left leg and she has pain on the left shin area. Leg pain is worse with weight bearing, sharp pain, 9/10, without radiation. Denies numbness, tingling, weakness.  Denies any fever, chills, SOB, chest pain. Daughter mentioned her weight has not changed based on daily weighing. Denies any other problems currently.   Past Medical History  Diagnosis Date  . Atrial fibrillation   . Hypertension   . Arthritis   . GERD (gastroesophageal reflux disease)   . COPD (chronic obstructive pulmonary disease)   . PNA (pneumonia)   . Anemia due to GI blood loss 07/11/2011  . 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 Stroke 2009    aphasia and memory impairment as residual  . Hypoxia   . Atrial fibrillation, permanent, rate controlled on coumadin 05/10/2011  . Hypogammaglobulinemia 06/01/2012    Immunoglobulins 05/01/2012-normal IgG M., normal IgA,  low IgG [phone]).   . 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 CVA history 05/10/2011  . CHF (congestive heart failure) 65/46/5035    diastolic  . History of nuclear stress test 2009    normal dobutamine myoview    Past Surgical History  Procedure Laterality Date  . Hip fracture surgery    . Cholecystectomy    . Knee surgery    . Cataract extraction    . Transthoracic echocardiogram  06/2011    EF 55-60%; mild LVH; LA & RA mod dilated; mod TR   Family History  Problem Relation Age of Onset  . Heart disease Mother   . Rectal cancer Mother   . Leukemia Father   . Heart failure Mother   . Stroke Mother   . Cancer Maternal Grandmother   . Hypertension Son   . Hypertension Daughter    History  Substance Use Topics  . Smoking status: Never Smoker   . Smokeless tobacco: Never Used  . Alcohol Use: 4.2 oz/week    7 Glasses of wine per week   OB History   Grav Para Term Preterm Abortions TAB SAB Ect Mult Living                 Review of Systems  Constitutional: Negative for fever, chills and fatigue.  HENT: Negative.   Eyes: Negative.   Respiratory: Negative.   Cardiovascular: Negative.   Gastrointestinal: Negative.   Endocrine: Negative.   Genitourinary: Negative.   Musculoskeletal: Positive for joint swelling.  Skin: Negative.   Allergic/Immunologic: Negative.   Neurological: Negative for dizziness, tremors, seizures, syncope, weakness, light-headedness and headaches.  Psychiatric/Behavioral: Negative.       Allergies  Amoxicillin; Other; and Penicillins  Home Medications   Prior to Admission medications  Medication Sig Start Date End Date Taking? Authorizing Provider  albuterol (PROVENTIL) (2.5 MG/3ML) 0.083% nebulizer solution Take 2.5 mg by nebulization 3 (three) times daily.    Yes Historical Provider, MD  alendronate (FOSAMAX) 70 MG tablet Take 70 mg by mouth every 7 (seven) days. Take with a full glass of water on an empty stomach. On tuesdays   Yes Historical Provider, MD  budesonide (PULMICORT) 0.5 MG/2ML nebulizer solution Take 0.5 mg by nebulization 2 (two) times daily.   Yes Historical Provider, MD  Cholecalciferol (VITAMIN D) 2000 UNITS CAPS Take 2,000 Units  by mouth daily.    Yes Historical Provider, MD  diltiazem (DILACOR XR) 180 MG 24 hr capsule Take 180 mg by mouth daily before breakfast.    Yes Historical Provider, MD  doxycycline (VIBRA-TABS) 100 MG tablet Take 1 tablet (100 mg total) by mouth 2 (two) times daily. 01/08/14  Yes Kinnie Feil, MD  ferrous sulfate 325 (65 FE) MG tablet Take 325 mg by mouth 2 (two) times daily with a meal.    Yes Historical Provider, MD  fluticasone (FLONASE) 50 MCG/ACT nasal spray Place 2 sprays into the nose daily.   Yes Historical Provider, MD  furosemide (LASIX) 80 MG tablet Take 80 mg by mouth 2 (two) times daily.   Yes Historical Provider, MD  loratadine (ALLERGY) 10 MG tablet Take 10 mg by mouth daily.   Yes Historical Provider, MD  Multiple Vitamins-Minerals (MULTIVITAMINS THER. W/MINERALS) TABS Take 1 tablet by mouth every evening.    Yes Historical Provider, MD  pantoprazole (PROTONIX) 40 MG tablet Take 40 mg by mouth daily.   Yes Historical Provider, MD  potassium chloride (K-DUR) 10 MEQ tablet Take 1 tablet (10 mEq total) by mouth daily. 01/08/14  Yes Kinnie Feil, MD  predniSONE (DELTASONE) 5 MG tablet Take 10 mg by mouth every other day.    Yes Historical Provider, MD  simvastatin (ZOCOR) 10 MG tablet Take 10 mg by mouth every morning.    Yes Historical Provider, MD  warfarin (COUMADIN) 4 MG tablet Take 1.5 tablets (6 mg total) by mouth daily. Take 6mg  daily or as directed by physician 04/30/13  Yes Annia Belt, MD   BP 115/53  Pulse 105  Temp(Src) 98.8 F (37.1 C) (Oral)  Resp 16  SpO2 95% Physical Exam  Constitutional: She is oriented to person, place, and time. She appears well-developed and well-nourished.  HENT:  Head: Normocephalic and atraumatic.  Right Ear: External ear normal.  Left Ear: External ear normal.  Nose: Nose normal.  Mouth/Throat: Oropharynx is clear and moist.  Eyes: Conjunctivae and EOM are normal. Pupils are equal, round, and reactive to light. No scleral  icterus.  Neck: Normal range of motion. Neck supple. No JVD present.  Cardiovascular: Normal rate, regular rhythm and normal heart sounds.  Exam reveals no gallop and no friction rub.   No murmur heard. Pulmonary/Chest: Effort normal and breath sounds normal. No respiratory distress. She has no wheezes. She has no rales. She exhibits no tenderness.  Abdominal: Soft. Bowel sounds are normal. She exhibits no distension. There is no tenderness. There is no rebound and no guarding.  Musculoskeletal: She exhibits edema and tenderness.       Right knee: Normal.       Left knee: Normal.       Right ankle: Normal.       Left ankle: She exhibits swelling. She exhibits no laceration and normal pulse. Tenderness.  Left lower leg: She exhibits tenderness, swelling and edema.  Good DP and PT pulses bilaterally.   There is left leg pitting edema upto her shin. She has tenderness to palpation on left shin. Her skin is warm and mildly erythematous.   Right leg normal.  Neurological: She is alert and oriented to person, place, and time. She has normal strength and normal reflexes. No cranial nerve deficit or sensory deficit.  Skin: Skin is warm.  Psychiatric: She has a normal mood and affect. Her behavior is normal.    ED Course  Procedures (including critical care time) Labs Review Labs Reviewed - No data to display  Imaging Review No results found.   EKG Interpretation None      Patient had doppler with negative for DVT recently. Will not repeat, she is on coumadin.  Swelling and tenderness could be secondary to edema, likely secondary to the change of lasix dose recently (lowered from 80mg  to 40mg  on last discharge).  MDM   Final diagnoses:  None    Asked patient to keep taking 80mg  BID lasix (that's her home dose). Explained that pain and swelling might be due to volume issues rather than cellulitis. Agrees to the plan.

## 2014-01-12 NOTE — ED Notes (Signed)
C/o l/foot pain, swelling and redness. Transported to restroom with assist of family. Pt incontinent of use. Clothing removed and placed in hospital gown.

## 2014-01-12 NOTE — ED Notes (Signed)
Pt and family report pt was admitted to the hospital for left foot/leg cellulitis on 7/6, discharge on 7/8, taking doxycline still. Pt foot was not this swollen when pt was d/c from hospital. Family member noticed on Friday foot was back to "normal swelling". Pt reports left foot swelling got really bad this morning with increased pain 9/10. Difficult to palpate pulse due to swelling.

## 2014-01-12 NOTE — ED Notes (Signed)
Bed: WA07 Expected date:  Expected time:  Means of arrival:  Comments: 

## 2014-01-12 NOTE — ED Notes (Signed)
Pt currently in the bathroom. 

## 2014-01-14 ENCOUNTER — Ambulatory Visit: Payer: Medicare Other

## 2014-01-14 ENCOUNTER — Other Ambulatory Visit: Payer: Medicare Other

## 2014-01-14 ENCOUNTER — Telehealth: Payer: Self-pay | Admitting: Hematology and Oncology

## 2014-01-14 ENCOUNTER — Ambulatory Visit: Payer: Medicare Other | Admitting: Hematology and Oncology

## 2014-01-14 NOTE — Telephone Encounter (Signed)
pt not felling well will call back to r/s...advised cc

## 2014-01-15 ENCOUNTER — Telehealth: Payer: Self-pay | Admitting: Hematology and Oncology

## 2014-01-15 NOTE — ED Provider Notes (Signed)
I saw and evaluated the patient, reviewed the resident's note and I agree with the findings and plan.  Pt with recent ankle/lower leg swelling after recent inpatient rx for cellulitis.  Since d/c, suspect leg left in dependent position more than when hospitalized in bed w elevated leg.  In addition, lasix dose recently decreased.    Labs.    Mirna Mires, MD 01/15/14 [phone]

## 2014-01-15 NOTE — Telephone Encounter (Signed)
pt dtr called to r/s 7/14 appts. dtr wants all appts for lb/cc/NG 1st week in aug. dtr given appt for 8/3.

## 2014-01-21 ENCOUNTER — Telehealth: Payer: Self-pay | Admitting: *Deleted

## 2014-01-21 NOTE — Telephone Encounter (Signed)
Patients daughter wanted to make sure potassium level is drawn when patient comes in on 8/3. PCP wants K level. There is a standing order for CMET.

## 2014-01-31 ENCOUNTER — Telehealth: Payer: Self-pay | Admitting: *Deleted

## 2014-01-31 NOTE — Telephone Encounter (Signed)
Message copied by Cathlean Cower on Fri Jan 31, 2014  4:08 PM ------      Message from: [facility], [place]: Fri Jan 31, 2014  3:36 PM       Cameo,            Please call pt to take potassium supplement 40 meq per day until her visit on 8/3      ----- Message -----         From: Annia Belt, MD         Sent: 01/31/2014   3:31 PM           To: Heath Lark, MD            You see her 8/3  Note low K      ----- Message -----         From: [facility]         Sent: 12/10/2013  11:02 AM           To: Annia Belt, MD                         ------

## 2014-01-31 NOTE — Telephone Encounter (Signed)
Instructed pt to take Potassium 40 meq daily until we see her and re check labs again on Monday 8/03.  Pt verbalized understanding.

## 2014-02-03 ENCOUNTER — Ambulatory Visit: Payer: Medicare Other | Admitting: Pharmacist

## 2014-02-03 ENCOUNTER — Telehealth: Payer: Self-pay | Admitting: Hematology and Oncology

## 2014-02-03 ENCOUNTER — Encounter: Payer: Self-pay | Admitting: Hematology and Oncology

## 2014-02-03 ENCOUNTER — Telehealth: Payer: Self-pay | Admitting: *Deleted

## 2014-02-03 ENCOUNTER — Other Ambulatory Visit (HOSPITAL_BASED_OUTPATIENT_CLINIC_OR_DEPARTMENT_OTHER): Payer: Medicare Other

## 2014-02-03 ENCOUNTER — Ambulatory Visit (HOSPITAL_BASED_OUTPATIENT_CLINIC_OR_DEPARTMENT_OTHER): Payer: Medicare Other | Admitting: Hematology and Oncology

## 2014-02-03 VITALS — BP 131/59 | HR 82 | Temp 98.3°F | Resp 17 | Ht 64.0 in | Wt 142.9 lb

## 2014-02-03 DIAGNOSIS — Z7901 Long term (current) use of anticoagulants: Secondary | ICD-10-CM

## 2014-02-03 DIAGNOSIS — D500 Iron deficiency anemia secondary to blood loss (chronic): Secondary | ICD-10-CM

## 2014-02-03 DIAGNOSIS — Q278 Other specified congenital malformations of peripheral vascular system: Secondary | ICD-10-CM

## 2014-02-03 DIAGNOSIS — I4891 Unspecified atrial fibrillation: Secondary | ICD-10-CM

## 2014-02-03 DIAGNOSIS — I4821 Permanent atrial fibrillation: Secondary | ICD-10-CM

## 2014-02-03 DIAGNOSIS — I639 Cerebral infarction, unspecified: Secondary | ICD-10-CM

## 2014-02-03 DIAGNOSIS — E876 Hypokalemia: Secondary | ICD-10-CM

## 2014-02-03 DIAGNOSIS — K5520 Angiodysplasia of colon without hemorrhage: Secondary | ICD-10-CM

## 2014-02-03 LAB — COMPREHENSIVE METABOLIC PANEL (CC13)
ALT: 11 U/L (ref 0–55)
AST: 18 U/L (ref 5–34)
Albumin: 2.8 g/dL — ABNORMAL LOW (ref 3.5–5.0)
Alkaline Phosphatase: 83 U/L (ref 40–150)
Anion Gap: 8 mEq/L (ref 3–11)
BILIRUBIN TOTAL: 0.26 mg/dL (ref 0.20–1.20)
BUN: 10.3 mg/dL (ref 7.0–26.0)
CHLORIDE: 102 meq/L (ref 98–109)
CO2: 32 mEq/L — ABNORMAL HIGH (ref 22–29)
Calcium: 9.1 mg/dL (ref 8.4–10.4)
Creatinine: 0.9 mg/dL (ref 0.6–1.1)
Glucose: 96 mg/dl (ref 70–140)
Potassium: 3.4 mEq/L — ABNORMAL LOW (ref 3.5–5.1)
Sodium: 142 mEq/L (ref 136–145)
Total Protein: 5.5 g/dL — ABNORMAL LOW (ref 6.4–8.3)

## 2014-02-03 LAB — IRON AND TIBC CHCC
%SAT: 27 % (ref 21–57)
Iron: 67 ug/dL (ref 41–142)
TIBC: 251 ug/dL (ref 236–444)
UIBC: 184 ug/dL (ref 120–384)

## 2014-02-03 LAB — CBC WITH DIFFERENTIAL/PLATELET
BASO%: 0.6 % (ref 0.0–2.0)
Basophils Absolute: 0 10*3/uL (ref 0.0–0.1)
EOS%: 7.6 % — AB (ref 0.0–7.0)
Eosinophils Absolute: 0.6 10*3/uL — ABNORMAL HIGH (ref 0.0–0.5)
HEMATOCRIT: 32.6 % — AB (ref 34.8–46.6)
HGB: 10.1 g/dL — ABNORMAL LOW (ref 11.6–15.9)
LYMPH#: 0.5 10*3/uL — AB (ref 0.9–3.3)
LYMPH%: 6.9 % — ABNORMAL LOW (ref 14.0–49.7)
MCH: 26.7 pg (ref 25.1–34.0)
MCHC: 30.9 g/dL — AB (ref 31.5–36.0)
MCV: 86.5 fL (ref 79.5–101.0)
MONO#: 0.8 10*3/uL (ref 0.1–0.9)
MONO%: 9.7 % (ref 0.0–14.0)
NEUT#: 5.9 10*3/uL (ref 1.5–6.5)
NEUT%: 75.2 % (ref 38.4–76.8)
Platelets: 344 10*3/uL (ref 145–400)
RBC: 3.76 10*6/uL (ref 3.70–5.45)
RDW: 17.4 % — ABNORMAL HIGH (ref 11.2–14.5)
WBC: 7.8 10*3/uL (ref 3.9–10.3)

## 2014-02-03 LAB — PROTIME-INR
INR: 1.8 — ABNORMAL LOW (ref 2.00–3.50)
PROTIME: 21.6 s — AB (ref 10.6–13.4)

## 2014-02-03 LAB — FERRITIN CHCC: Ferritin: 137 ng/ml (ref 9–269)

## 2014-02-03 LAB — POCT INR: INR: 1.8

## 2014-02-03 NOTE — Progress Notes (Signed)
[place] FOLLOW-UP progress notes  Patient Care Team: [name], MD as PCP - General (Internal Medicine) Pixie Casino, MD (Cardiology)  CHIEF COMPLAINTS/PURPOSE OF VISIT:  Iron deficiency anemia from chronic GI bleed, arteriovenous malformation and nature fibrillation with history of stroke  HISTORY OF PRESENTING ILLNESS:  Angelica [name] 80 y.o. female was transferred to my care after her prior physician has left.  I reviewed the patient's records extensive and collaborated the history with the patient. Summary of her history is as follows: This is a pleasant lady with permanent atrial fibrillation, AV malformation and diastolic heart failure.  She is on full dose Coumadin anticoagulation due to a previous stroke. It was felt aspirin was contraindicated in view of her AVMs and GI bleeding. Her Coumadin is monitored through our office. She requires periodic parenteral iron infusions. Most recent infusion given on 12/20/2012. She has had a number of medical issues over the last 2 years. She was hospitalized one year ago in December 2012 with pneumonia and congestive heart failure. She was hospitalized in April 2013 with viral gastroenteritis/diarrhea. Hemoglobin dipped from her baseline of 11 g at that time and she was given parenteral iron. She was admitted in April 2014 with dyspnea and left flank pain. Evaluation revealed she was in heart failure. She had a subsequent fall and fracture of her right arm. She has chronic rhinitis and uses a Proventil nebulizer and Astelin nasal spray as well as Pulmicort nebulizers. She is on chronic low-dose prednisone 5 mg daily.  Recently, she was admitted to the hospital with left leg cellulitis. She has mild chronic hematuria and mild bleeding from the stool. She tolerated oral iron supplement twice a day.  MEDICAL HISTORY:  Past Medical History  Diagnosis Date  . Atrial fibrillation   . Hypertension   . Arthritis   . GERD  (gastroesophageal reflux disease)   . COPD (chronic obstructive pulmonary disease)   . PNA (pneumonia)   . Anemia due to GI blood loss 07/11/2011  . 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 Stroke 2009    aphasia and memory impairment as residual  . Hypoxia   . Atrial fibrillation, permanent, rate controlled on coumadin 05/10/2011  . Hypogammaglobulinemia 06/01/2012    Immunoglobulins 05/01/2012-normal IgG M., normal IgA,  low IgG 344([phone]).   . 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 CVA history 05/10/2011  . CHF (congestive heart failure) 95/28/4132    diastolic  . History of nuclear stress test 2009    normal dobutamine myoview    SURGICAL HISTORY: Past Surgical History  Procedure Laterality Date  . Hip fracture surgery    . Cholecystectomy    . Knee surgery    . Cataract extraction    . Transthoracic echocardiogram  06/2011    EF 55-60%; mild LVH; LA & RA mod dilated; mod TR    SOCIAL HISTORY: History   Social History  . Marital Status: Married    Spouse Name: N/A    Number of Children: 2  . Years of Education: N/A   Occupational History  . RETIRED    Social History Main Topics  . Smoking status: Never Smoker   . Smokeless tobacco: Never Used  . Alcohol Use: No  . Drug Use: No  . Sexual Activity: No   Other Topics Concern  . Not on file   Social History Narrative  . No narrative on file  FAMILY HISTORY: Family History  Problem Relation Age of Onset  . Heart disease Mother   . Rectal cancer Mother   . Leukemia Father   . Heart failure Mother   . Stroke Mother   . Cancer Maternal Grandmother   . Hypertension Son   . Hypertension Daughter     ALLERGIES:  is allergic to amoxicillin; other; and penicillins.  MEDICATIONS:  Current Outpatient Prescriptions  Medication Sig Dispense Refill  . albuterol (PROVENTIL) (2.5 MG/3ML) 0.083% nebulizer solution Take 2.5  mg by nebulization 3 (three) times daily.       Marland Kitchen alendronate (FOSAMAX) 70 MG tablet Take 70 mg by mouth every 7 (seven) days. Take with a full glass of water on an empty stomach. On tuesdays      . budesonide (PULMICORT) 0.5 MG/2ML nebulizer solution Take 0.5 mg by nebulization 2 (two) times daily.      . Cholecalciferol (VITAMIN D) 2000 UNITS CAPS Take 2,000 Units by mouth daily.       Marland Kitchen diltiazem (DILACOR XR) 180 MG 24 hr capsule Take 180 mg by mouth daily before breakfast.       . ferrous sulfate 325 (65 FE) MG tablet Take 325 mg by mouth 2 (two) times daily with a meal.       . furosemide (LASIX) 80 MG tablet Take 80 mg by mouth 2 (two) times daily.      Marland Kitchen loratadine (ALLERGY) 10 MG tablet Take 10 mg by mouth daily.      . Multiple Vitamins-Minerals (MULTIVITAMINS THER. W/MINERALS) TABS Take 1 tablet by mouth every evening.       . pantoprazole (PROTONIX) 40 MG tablet Take 40 mg by mouth daily.      . potassium chloride (K-DUR) 10 MEQ tablet Take 1 tablet (10 mEq total) by mouth daily.  30 tablet  0  . predniSONE (DELTASONE) 5 MG tablet Take 10 mg by mouth every other day.       . simvastatin (ZOCOR) 10 MG tablet Take 10 mg by mouth every morning.       . warfarin (COUMADIN) 4 MG tablet Take 1.5 tablets (6 mg total) by mouth daily. Take 6mg  daily or as directed by physician  160 tablet  3   No current facility-administered medications for this visit.    REVIEW OF SYSTEMS:   Constitutional: Denies fevers, chills or abnormal night sweats Eyes: Denies blurriness of vision, double vision or watery eyes Ears, nose, mouth, throat, and face: Denies mucositis or sore throat Respiratory: Denies cough, dyspnea or wheezes Cardiovascular: Denies palpitation, chest discomfort  Gastrointestinal:  Denies nausea, heartburn or change in bowel habits Lymphatics: Denies new lymphadenopathy or easy bruising Neurological:Denies numbness, tingling or new weaknesses Behavioral/Psych: Mood is stable, no new  changes  All other systems were reviewed with the patient and are negative.  PHYSICAL EXAMINATION: ECOG PERFORMANCE STATUS: 1 - Symptomatic but completely ambulatory  Filed Vitals:   02/03/14 1307  BP: 131/59  Pulse: 82  Temp: 98.3 F (36.8 C)  Resp: 17   Filed Weights   02/03/14 1307  Weight: 142 lb 14.4 oz (64.819 kg)    GENERAL:alert, no distress and comfortable. She looked mildly cushingoid SKIN: skin color is pale, texture, turgor are normal, no rashes or significant lesions EYES: normal, conjunctiva are pink and non-injected, sclera clear OROPHARYNX:no exudate, normal lips, buccal mucosa, and tongue  NECK: supple, thyroid normal size, non-tender, without nodularity LYMPH:  no palpable lymphadenopathy in the cervical, axillary or  inguinal LUNGS: clear to auscultation and percussion with normal breathing effort HEART: regular rate & rhythm and no murmurs with mild bilateral lower extremity edema ABDOMEN:abdomen soft, non-tender and normal bowel sounds Musculoskeletal:no cyanosis of digits and no clubbing  PSYCH: alert & oriented x 3 with fluent speech NEURO: no focal motor/sensory deficits  LABORATORY DATA:  I have reviewed the data as listed Lab Results  Component Value Date   WBC 7.8 02/03/2014   HGB 10.1* 02/03/2014   HCT 32.6* 02/03/2014   MCV 86.5 02/03/2014   PLT 344 02/03/2014    Recent Labs  12/10/13 1049 01/06/14 1125 01/07/14 0438 01/08/14 0507 01/12/14 1247 02/03/14 1232  NA 142 144 141  --  141 142  K 3.0* 2.7* 3.6* 3.0* 3.5* 3.4*  CL 100 101 100  --  98  --   CO2 31 34* 30  --  29 32*  GLUCOSE 123* 97 140*  --  115* 96  BUN 11 9 9   --  12 10.3  CREATININE 0.95 0.90 0.95  --  0.88 0.9  CALCIUM 9.4 9.6 8.8  --  9.4 9.1  GFRNONAA  --  55* 52*  --  57*  --   GFRAA  --  64* 60*  --  66*  --   PROT 5.1* 5.6*  --   --   --  5.5*  ALBUMIN 3.1* 2.9*  --   --   --  2.8*  AST 14 16  --   --   --  18  ALT 11 11  --   --   --  11  ALKPHOS 73 110  --   --    --  83  BILITOT 0.3 0.3  --   --   --  0.26   ASSESSMENT & PLAN:  Atrial fibrillation, permanent, rate controlled on coumadin Her INR is mildly subtherapeutic. She has no bleeding complication and no evidence of recurrence of stroke. She will continue followup at the Coumadin clinic here. The goal of anticoagulation therapy is 2-3, duration of treatment for life.  Arteriovenous malformation of gastrointestinal tract She has history of recurrent bleed. She remains on oral iron therapy indefinitely.  Anemia due to GI blood loss This is likely anemia of chronic disease. The patient denies recent history of bleeding such as epistaxis, hematuria or hematochezia. She is asymptomatic from the anemia. We will observe for now.  She does not require transfusion now.    Hypokalemia This is due to chronic diuretic therapy. She will need to continue on oral potassium supplement.     Orders Placed This Encounter  Procedures  . CBC & Diff and Retic    Standing Status: Standing     Number of Occurrences: 9     Standing Expiration Date: 02/04/2015  . Iron and TIBC    Standing Status: Standing     Number of Occurrences: 9     Standing Expiration Date: 02/04/2015  . Ferritin    Standing Status: Standing     Number of Occurrences: 9     Standing Expiration Date: 02/04/2015    All questions were answered. The patient knows to call the clinic with any problems, questions or concerns. I spent 15 minutes counseling the patient face to face. The total time spent in the appointment was 20 minutes and more than 50% was on counseling.     [facility], [place], MD 02/03/2014 1:43 PM

## 2014-02-03 NOTE — Assessment & Plan Note (Signed)
This is likely anemia of chronic disease. The patient denies recent history of bleeding such as epistaxis, hematuria or hematochezia. She is asymptomatic from the anemia. We will observe for now.  She does not require transfusion now.   

## 2014-02-03 NOTE — Progress Notes (Signed)
INR at goal Pt is doing well today  Pt seen in clinic with her daughter and patient is also set to see Dr. Alvy Bimler this afternoon INR has been stable No unusual bleeding or bruising to report No missed or extra doses Pt recently discharged from hospital pt is doing well and she is no longer on doxycycline for cellulitis Pt is on oral supplement of potassium which is still mildly low today at 3.4 Plan: Continue 6 mg/day Repeat INR on 02/25/14 - lab at 11:00 am in [facility]. We will call you with the results and instructions

## 2014-02-03 NOTE — Telephone Encounter (Signed)
Message copied by Cathlean Cower on Mon Feb 03, 2014  3:28 PM ------      Message from: [facility], [place]: Mon Feb 03, 2014  2:47 PM      Regarding: iron studies are normal       Pls let daughter know      ----- Message -----         From: Lab in [facility] Interface         Sent: 02/03/2014  12:48 PM           To: Heath Lark, MD                   ------

## 2014-02-03 NOTE — Telephone Encounter (Signed)
Informed daughter of potassium level and Dr. Calton Dach message below.  She verbalized understanding and requests labs be faxed to Dr. Doristine Devoid office.   Faxed labs to Dr. Noah Delaine at [phone].

## 2014-02-03 NOTE — Telephone Encounter (Signed)
Informed daughter of iron studies all wnl.  She verbalized understanding.

## 2014-02-03 NOTE — Assessment & Plan Note (Signed)
This is due to chronic diuretic therapy. She will need to continue on oral potassium supplement.

## 2014-02-03 NOTE — Telephone Encounter (Signed)
gv and printed appt sched and avs for pt for Jan 2016...Marland Kitchenpt did not want me to sched labs...she will sched at [place] her self.

## 2014-02-03 NOTE — Telephone Encounter (Signed)
Message copied by Cathlean Cower on Mon Feb 03, 2014  2:17 PM ------      Message from: [facility], [place]      Created: Mon Feb 03, 2014  1:59 PM      Regarding: low K       Tell her daughter K is better but she will likely need K long term      ----- Message -----         From: Lab in [facility] Interface         Sent: 02/03/2014  12:48 PM           To: Heath Lark, MD                   ------

## 2014-02-03 NOTE — Assessment & Plan Note (Signed)
She has history of recurrent bleed. She remains on oral iron therapy indefinitely.

## 2014-02-03 NOTE — Assessment & Plan Note (Signed)
Her INR is mildly subtherapeutic. She has no bleeding complication and no evidence of recurrence of stroke. She will continue followup at the Coumadin clinic here. The goal of anticoagulation therapy is 2-3, duration of treatment for life.

## 2014-02-03 NOTE — Patient Instructions (Signed)
INR at goal no changes Continue 6 mg/day Repeat INR on 02/25/14 - lab at 11:00 am in [facility]. We will call you with the results and instructions

## 2014-02-10 NOTE — Telephone Encounter (Signed)
Phone call - encounter closed. 

## 2014-02-18 ENCOUNTER — Encounter: Payer: Self-pay | Admitting: Internal Medicine

## 2014-02-18 ENCOUNTER — Ambulatory Visit (INDEPENDENT_AMBULATORY_CARE_PROVIDER_SITE_OTHER): Payer: Medicare Other | Admitting: Internal Medicine

## 2014-02-18 VITALS — BP 118/60 | HR 64 | Ht 64.5 in | Wt 140.2 lb

## 2014-02-18 DIAGNOSIS — I509 Heart failure, unspecified: Secondary | ICD-10-CM

## 2014-02-18 DIAGNOSIS — I4821 Permanent atrial fibrillation: Secondary | ICD-10-CM

## 2014-02-18 DIAGNOSIS — I639 Cerebral infarction, unspecified: Secondary | ICD-10-CM

## 2014-02-18 DIAGNOSIS — I4891 Unspecified atrial fibrillation: Secondary | ICD-10-CM

## 2014-02-18 DIAGNOSIS — I6350 Cerebral infarction due to unspecified occlusion or stenosis of unspecified cerebral artery: Secondary | ICD-10-CM

## 2014-02-18 NOTE — Progress Notes (Signed)
Patient ID: Angelica [name], female   DOB: 1922-07-09, 80 y.o.   MRN: [id number]    OFFICE NOTE  Chief Complaint:  Routine follow-up  Primary Care Physician: [name], MD  HPI:  Angelica [name] is an 80 yo female with permanent atrial fibrillation, AV malformation, and diastolic heart failure. She has previously had issues with bleeding and has been followed by oncology for this and her INR. She was diagnosed with COPD at some point; however, this has been questioned by her pulmonologist, who is Dr. Lamonte Sakai. He has tried her on and off of inhalers with not much improvement in her shortness of breath.  Since her last office visit she was in the hospital in July for cellulitis. She had hypokalemia at that time and was taken off of her lasix. She then had increased swelling in her LE and was taken to the ED to have this evaluated. She was advised to start her lasix at that time. Daughter notes potassium has always run on the low side. Is now on potassium pill. Is taking lasix 80 once a day. No edema, dyspnea, orthopnea, chest pain, palpitations, PND. Last INR was 1.8. Next INR check is next Tuesday. Notes some easy bruising and some blood in stools on occult testing, no visible blood. Notes no changes in bleeding. Are checking daily weights and these have been stable.  PMHx:  Past Medical History  Diagnosis Date  . Atrial fibrillation   . Hypertension   . Arthritis   . GERD (gastroesophageal reflux disease)   . COPD (chronic obstructive pulmonary disease)   . PNA (pneumonia)   . Anemia due to GI blood loss 07/11/2011  . 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 Stroke 2009    aphasia and memory impairment as residual  . Hypoxia   . Atrial fibrillation, permanent, rate controlled on coumadin 05/10/2011  . Hypogammaglobulinemia 06/01/2012    Immunoglobulins 05/01/2012-normal IgG M., normal IgA,   low IgG [phone]).   . 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 CVA history 05/10/2011  . CHF (congestive heart failure) 40/02/6760    diastolic  . History of nuclear stress test 2009    normal dobutamine myoview    Past Surgical History  Procedure Laterality Date  . Hip fracture surgery    . Cholecystectomy    . Knee surgery    . Cataract extraction    . Transthoracic echocardiogram  06/2011    EF 55-60%; mild LVH; LA & RA mod dilated; mod TR    FAMHx:  Family History  Problem Relation Age of Onset  . Heart disease Mother   . Rectal cancer Mother   . Leukemia Father   . Heart failure Mother   . Stroke Mother   . Cancer Maternal Grandmother   . Hypertension Son   . Hypertension Daughter     SOCHx:   reports that she has never smoked. She has never used smokeless tobacco. She reports that she does not drink alcohol or use illicit drugs.  ALLERGIES:  Allergies  Allergen Reactions  . Amoxicillin Hives  . Other     Ragweed--wheezing, congestion, etc  . Penicillins Hives    ROS: Pertinent items are noted in HPI.  HOME MEDS: Current Outpatient Prescriptions  Medication Sig Dispense Refill  . albuterol (PROVENTIL) (2.5 MG/3ML) 0.083% nebulizer solution Take 2.5 mg by nebulization 3 (three) times daily.       Marland Kitchen alendronate (FOSAMAX) 70 MG  tablet Take 70 mg by mouth every 7 (seven) days. Take with a full glass of water on an empty stomach. On tuesdays      . budesonide (PULMICORT) 0.5 MG/2ML nebulizer solution Take 0.5 mg by nebulization 2 (two) times daily.      . Cholecalciferol (VITAMIN D) 2000 UNITS CAPS Take 2,000 Units by mouth daily.       Marland Kitchen diltiazem (DILACOR XR) 180 MG 24 hr capsule Take 180 mg by mouth daily before breakfast.       . ferrous sulfate 325 (65 FE) MG tablet Take 325 mg by mouth 2 (two) times daily with a meal.       . furosemide (LASIX) 80 MG tablet Take 80 mg by mouth 2 (two) times daily.      Marland Kitchen loratadine (ALLERGY) 10 MG tablet Take 10 mg by mouth  daily.      . Multiple Vitamins-Minerals (MULTIVITAMINS THER. W/MINERALS) TABS Take 1 tablet by mouth every evening.       . pantoprazole (PROTONIX) 40 MG tablet Take 40 mg by mouth daily.      . potassium chloride (K-DUR) 10 MEQ tablet Take 1 tablet (10 mEq total) by mouth daily.  30 tablet  0  . predniSONE (DELTASONE) 5 MG tablet Take 10 mg by mouth every other day.       . simvastatin (ZOCOR) 10 MG tablet Take 10 mg by mouth every morning.       . warfarin (COUMADIN) 4 MG tablet Take 1.5 tablets (6 mg total) by mouth daily. Take 6mg  daily or as directed by physician  160 tablet  3   No current facility-administered medications for this visit.    LABS/IMAGING: No results found for this or any previous visit (from the past 48 hour(s)). No results found.  VITALS: BP 118/60  Pulse 64  Ht 5' 4.5" (1.638 m)  Wt 140 lb 3.2 oz (63.594 kg)  BMI 23.70 kg/m2  EXAM: General appearance: alert, cooperative and no distress Neck: no adenopathy, no carotid bruit, no JVD and supple, symmetrical, trachea midline Lungs: clear to auscultation bilaterally Heart: irregularly irregular rhythm and no murmur Abdomen: soft, non-tender; bowel sounds normal; no masses,  no organomegaly Extremities: edema lower extremity 1+ bilateral to just above the ankle Pulses: 2+ and symmetric radial  EKG: Afib with rate of 64, right bundle branch block  ASSESSMENT: 1. Permanent atrial fibrillation 2. Chronic diastolic heart failure 3. COPD 4. Anemia with AVMs  PLAN: 1.   Has had stable weights since last visit. Did note increase in swelling when she came off her lasix, though this has improved with restarting lasix. Today swelling is minimal compared to previously. Recommend that she continue lasix 80 mg daily. She is in permanent atrial fibrillation with good rate control. She is chronically anticoagulated with warfarin and INR was slightly below goal at last check. This is followed by heme/onc. She notes no  visible signs of bleeding, though mentions dark stools relating to iron use and prior history of positive fecal occult blood. Her daughter reports she had previous work-up for this and was noted to have an AVM. We will continue her current management at this time. She will follow-up in 6 months.   Tommi Rumps 02/18/2014, 10:54 AM  Pt. Seen and examined. Agree with the resident note as written. Mrs. Herrera returns today for followup. She is doing quite well. She had problems with lower extremity swelling when she was off of her Lasix and  needs to stay on it. There is report of intermittent heme positive stools and she does have a history of AVMs. No gross bright red lead per rectum is noted. She continues to be on warfarin with INR is at a goal of around 2.0  Pixie Casino, MD, [facility] Attending Cardiologist [place]

## 2014-02-18 NOTE — Patient Instructions (Signed)
Your physician wants you to follow-up in: 6 months with Dr. Hilty. You will receive a reminder letter in the mail two months in advance. If you don't receive a letter, please call our office to schedule the follow-up appointment.    

## 2014-02-25 ENCOUNTER — Other Ambulatory Visit: Payer: Medicare Other | Admitting: Lab

## 2014-02-27 NOTE — Telephone Encounter (Signed)
Encounter was telephone call. 

## 2014-02-28 ENCOUNTER — Telehealth: Payer: Self-pay | Admitting: Pharmacist

## 2014-02-28 ENCOUNTER — Other Ambulatory Visit (HOSPITAL_BASED_OUTPATIENT_CLINIC_OR_DEPARTMENT_OTHER): Payer: Medicare Other | Admitting: Lab

## 2014-02-28 DIAGNOSIS — Z7901 Long term (current) use of anticoagulants: Secondary | ICD-10-CM

## 2014-02-28 DIAGNOSIS — E876 Hypokalemia: Secondary | ICD-10-CM

## 2014-02-28 DIAGNOSIS — I4891 Unspecified atrial fibrillation: Secondary | ICD-10-CM

## 2014-02-28 DIAGNOSIS — D500 Iron deficiency anemia secondary to blood loss (chronic): Secondary | ICD-10-CM

## 2014-02-28 LAB — RETICULOCYTES (CHCC)
ABS RETIC: 91.4 10*3/uL (ref 19.0–186.0)
RBC.: 4.35 MIL/uL (ref 3.87–5.11)
Retic Ct Pct: 2.1 % (ref 0.4–2.3)

## 2014-02-28 LAB — POCT INR: INR: 1.5

## 2014-02-28 LAB — CBC WITH DIFFERENTIAL (CANCER CENTER ONLY)
BASO#: 0 10*3/uL (ref 0.0–0.2)
BASO%: 0.2 % (ref 0.0–2.0)
EOS ABS: 0.3 10*3/uL (ref 0.0–0.5)
EOS%: 3.4 % (ref 0.0–7.0)
HCT: 37.4 % (ref 34.8–46.6)
HEMOGLOBIN: 11.8 g/dL (ref 11.6–15.9)
LYMPH#: 0.6 10*3/uL — AB (ref 0.9–3.3)
LYMPH%: 6.1 % — ABNORMAL LOW (ref 14.0–48.0)
MCH: 27.7 pg (ref 26.0–34.0)
MCHC: 31.6 g/dL — ABNORMAL LOW (ref 32.0–36.0)
MCV: 88 fL (ref 81–101)
MONO#: 0.9 10*3/uL (ref 0.1–0.9)
MONO%: 8.8 % (ref 0.0–13.0)
NEUT#: 8.2 10*3/uL — ABNORMAL HIGH (ref 1.5–6.5)
NEUT%: 81.5 % — ABNORMAL HIGH (ref 39.6–80.0)
Platelets: 264 10*3/uL (ref 145–400)
RBC: 4.26 10*6/uL (ref 3.70–5.32)
RDW: 15.3 % (ref 11.1–15.7)
WBC: 10 10*3/uL (ref 3.9–10.0)

## 2014-02-28 LAB — IRON AND TIBC CHCC
%SAT: 13 % — ABNORMAL LOW (ref 21–57)
IRON: 32 ug/dL — AB (ref 41–142)
TIBC: 241 ug/dL (ref 236–444)
UIBC: 209 ug/dL (ref 120–384)

## 2014-02-28 LAB — FERRITIN CHCC: Ferritin: 188 ng/ml (ref 9–269)

## 2014-02-28 LAB — PROTIME-INR (CHCC SATELLITE)
INR: 1.5 — ABNORMAL LOW (ref 2.0–3.5)
Protime: 18 Seconds — ABNORMAL HIGH (ref 10.6–13.4)

## 2014-02-28 NOTE — Telephone Encounter (Signed)
Left VM on daughter's Gardiner Barefoot) phone to call the Coumadin clinic regarding today's INR drawn at Dr. Antonieta Pert office.   INR = 1.5    Goal 1.8-2.3

## 2014-03-03 ENCOUNTER — Ambulatory Visit (INDEPENDENT_AMBULATORY_CARE_PROVIDER_SITE_OTHER): Payer: Medicare Other | Admitting: Pharmacist

## 2014-03-03 DIAGNOSIS — I4891 Unspecified atrial fibrillation: Secondary | ICD-10-CM

## 2014-03-03 DIAGNOSIS — I639 Cerebral infarction, unspecified: Secondary | ICD-10-CM

## 2014-03-03 DIAGNOSIS — I4821 Permanent atrial fibrillation: Secondary | ICD-10-CM

## 2014-03-03 NOTE — Progress Notes (Signed)
02/28/14 INR is slightly below goal. Goal INR = 1.8-2.3 No changes in diet or medications per pt daughter. No missed or extra coumadin doses prior to 02/28/14. Pt missed one dose on 03/02/14 (not seen in current lab done on 02/28/14). No problems or concerns regarding anticoagulation. Take 8mg  today.  On 03/04/14, continue 6 mg/day.   Repeat INR on 03/25/14 - labs at [facility] with Dr. Noah Delaine. Faxed request for INR to [phone] and [phone]. Spoke with Dr. Doristine Devoid RN (Phone [phone]).  INR results on 9/22/15will be faxed to [phone]. No charge encounter - spoke by telephone. Spoke with Gardiner Barefoot - [phone]

## 2014-03-03 NOTE — Patient Instructions (Signed)
Take 8mg  today.  On 03/04/14, continue 6 mg/day.   Repeat INR on 03/25/14 - labs at [facility] with Dr. Alyson Ingles.   We will call you with the results and instructions.

## 2014-03-25 LAB — POCT INR: INR: 2.2

## 2014-03-26 ENCOUNTER — Ambulatory Visit (INDEPENDENT_AMBULATORY_CARE_PROVIDER_SITE_OTHER): Payer: Self-pay | Admitting: Pharmacist

## 2014-03-26 DIAGNOSIS — I639 Cerebral infarction, unspecified: Secondary | ICD-10-CM

## 2014-03-26 DIAGNOSIS — I4821 Permanent atrial fibrillation: Secondary | ICD-10-CM

## 2014-03-26 DIAGNOSIS — I4891 Unspecified atrial fibrillation: Secondary | ICD-10-CM

## 2014-03-26 DIAGNOSIS — I6350 Cerebral infarction due to unspecified occlusion or stenosis of unspecified cerebral artery: Secondary | ICD-10-CM

## 2014-03-26 NOTE — Progress Notes (Signed)
*  Telephone encounter - no charge* Received INR fax from [place] from 03/25/14 INR at goal at 2.2 Other labs: Hgb 10.3, plts 479, Scr = 1.2 Spoke to patient's daughter, [name] over the phone Pt is doing well with no complaints No missed or extra doses No medication or diet changes No unusual bleeding or bruising Plan: No changes Continue 6 mg daily Recheck INR in 1 month in [place] on 04/23/14 at 11am We will call patient with results

## 2014-03-26 NOTE — Patient Instructions (Signed)
INR at goal Continue 6 mg/day.  Repeat INR on 04/23/14 - lab at [place] at 11am.   We will call you with the results and instructions.

## 2014-04-15 ENCOUNTER — Inpatient Hospital Stay (HOSPITAL_COMMUNITY): Payer: Medicare Other

## 2014-04-15 ENCOUNTER — Emergency Department (HOSPITAL_COMMUNITY): Payer: Medicare Other

## 2014-04-15 ENCOUNTER — Inpatient Hospital Stay (HOSPITAL_COMMUNITY)
Admission: EM | Admit: 2014-04-15 | Discharge: 2014-04-18 | DRG: 069 | Disposition: A | Discharge status: Skilled Nursing Facility | Payer: Medicare Other | Attending: Internal Medicine | Admitting: Internal Medicine

## 2014-04-15 ENCOUNTER — Encounter (HOSPITAL_COMMUNITY): Payer: Self-pay | Admitting: Emergency Medicine

## 2014-04-15 DIAGNOSIS — I639 Cerebral infarction, unspecified: Secondary | ICD-10-CM

## 2014-04-15 DIAGNOSIS — R208 Other disturbances of skin sensation: Secondary | ICD-10-CM

## 2014-04-15 DIAGNOSIS — R1311 Dysphagia, oral phase: Secondary | ICD-10-CM | POA: Diagnosis present

## 2014-04-15 DIAGNOSIS — I272 Other secondary pulmonary hypertension: Secondary | ICD-10-CM | POA: Diagnosis present

## 2014-04-15 DIAGNOSIS — R200 Anesthesia of skin: Secondary | ICD-10-CM | POA: Diagnosis not present

## 2014-04-15 DIAGNOSIS — R262 Difficulty in walking, not elsewhere classified: Secondary | ICD-10-CM | POA: Diagnosis present

## 2014-04-15 DIAGNOSIS — Z7901 Long term (current) use of anticoagulants: Secondary | ICD-10-CM

## 2014-04-15 DIAGNOSIS — R296 Repeated falls: Secondary | ICD-10-CM | POA: Diagnosis present

## 2014-04-15 DIAGNOSIS — S065XAA Traumatic subdural hemorrhage with loss of consciousness status unknown, initial encounter: Secondary | ICD-10-CM

## 2014-04-15 DIAGNOSIS — R0902 Hypoxemia: Secondary | ICD-10-CM | POA: Diagnosis present

## 2014-04-15 DIAGNOSIS — D500 Iron deficiency anemia secondary to blood loss (chronic): Secondary | ICD-10-CM

## 2014-04-15 DIAGNOSIS — R0602 Shortness of breath: Secondary | ICD-10-CM

## 2014-04-15 DIAGNOSIS — S065X9A Traumatic subdural hemorrhage with loss of consciousness of unspecified duration, initial encounter: Secondary | ICD-10-CM

## 2014-04-15 DIAGNOSIS — Q2733 Arteriovenous malformation of digestive system vessel: Secondary | ICD-10-CM

## 2014-04-15 DIAGNOSIS — I509 Heart failure, unspecified: Secondary | ICD-10-CM | POA: Diagnosis present

## 2014-04-15 DIAGNOSIS — M79606 Pain in leg, unspecified: Secondary | ICD-10-CM

## 2014-04-15 DIAGNOSIS — I69920 Aphasia following unspecified cerebrovascular disease: Secondary | ICD-10-CM

## 2014-04-15 DIAGNOSIS — Z800 Family history of malignant neoplasm of digestive organs: Secondary | ICD-10-CM

## 2014-04-15 DIAGNOSIS — I482 Chronic atrial fibrillation, unspecified: Secondary | ICD-10-CM | POA: Diagnosis present

## 2014-04-15 DIAGNOSIS — G459 Transient cerebral ischemic attack, unspecified: Principal | ICD-10-CM | POA: Diagnosis present

## 2014-04-15 DIAGNOSIS — G458 Other transient cerebral ischemic attacks and related syndromes: Secondary | ICD-10-CM

## 2014-04-15 DIAGNOSIS — Z6824 Body mass index (BMI) 24.0-24.9, adult: Secondary | ICD-10-CM

## 2014-04-15 DIAGNOSIS — Z8679 Personal history of other diseases of the circulatory system: Secondary | ICD-10-CM

## 2014-04-15 DIAGNOSIS — Z8709 Personal history of other diseases of the respiratory system: Secondary | ICD-10-CM

## 2014-04-15 DIAGNOSIS — Z823 Family history of stroke: Secondary | ICD-10-CM

## 2014-04-15 DIAGNOSIS — Z806 Family history of leukemia: Secondary | ICD-10-CM

## 2014-04-15 DIAGNOSIS — E876 Hypokalemia: Secondary | ICD-10-CM | POA: Diagnosis present

## 2014-04-15 DIAGNOSIS — E785 Hyperlipidemia, unspecified: Secondary | ICD-10-CM | POA: Diagnosis present

## 2014-04-15 DIAGNOSIS — J449 Chronic obstructive pulmonary disease, unspecified: Secondary | ICD-10-CM | POA: Diagnosis present

## 2014-04-15 DIAGNOSIS — Z8249 Family history of ischemic heart disease and other diseases of the circulatory system: Secondary | ICD-10-CM

## 2014-04-15 DIAGNOSIS — K5520 Angiodysplasia of colon without hemorrhage: Secondary | ICD-10-CM

## 2014-04-15 DIAGNOSIS — I6203 Nontraumatic chronic subdural hemorrhage: Secondary | ICD-10-CM | POA: Diagnosis present

## 2014-04-15 DIAGNOSIS — R1313 Dysphagia, pharyngeal phase: Secondary | ICD-10-CM | POA: Diagnosis present

## 2014-04-15 DIAGNOSIS — I4821 Permanent atrial fibrillation: Secondary | ICD-10-CM

## 2014-04-15 DIAGNOSIS — R197 Diarrhea, unspecified: Secondary | ICD-10-CM

## 2014-04-15 DIAGNOSIS — F0390 Unspecified dementia without behavioral disturbance: Secondary | ICD-10-CM | POA: Diagnosis present

## 2014-04-15 DIAGNOSIS — I10 Essential (primary) hypertension: Secondary | ICD-10-CM | POA: Diagnosis present

## 2014-04-15 LAB — ETHANOL: Alcohol, Ethyl (B): <11 mg/dL (ref 0–11)

## 2014-04-15 LAB — CBC
HCT: 35.2 % — ABNORMAL LOW (ref 36.0–46.0)
HEMOGLOBIN: 10.8 g/dL — AB (ref 12.0–15.0)
MCH: 26.2 pg (ref 26.0–34.0)
MCHC: 30.7 g/dL (ref 30.0–36.0)
MCV: 85.2 fL (ref 78.0–100.0)
Platelets: 376 10*3/uL (ref 150–400)
RBC: 4.13 MIL/uL (ref 3.87–5.11)
RDW: 16.3 % — AB (ref 11.5–15.5)
WBC: 6.3 10*3/uL (ref 4.0–10.5)

## 2014-04-15 LAB — COMPREHENSIVE METABOLIC PANEL
ALT: 12 U/L (ref 0–35)
AST: 21 U/L (ref 0–37)
Albumin: 2.6 g/dL — ABNORMAL LOW (ref 3.5–5.2)
Alkaline Phosphatase: 130 U/L — ABNORMAL HIGH (ref 39–117)
Anion gap: 11 (ref 5–15)
BUN: 13 mg/dL (ref 6–23)
CHLORIDE: 103 meq/L (ref 96–112)
CO2: 27 meq/L (ref 19–32)
CREATININE: 0.95 mg/dL (ref 0.50–1.10)
Calcium: 9.3 mg/dL (ref 8.4–10.5)
GFR calc Af Amer: 60 mL/min — ABNORMAL LOW (ref >90)
GFR, EST NON AFRICAN AMERICAN: 52 mL/min — AB (ref >90)
Glucose, Bld: 105 mg/dL — ABNORMAL HIGH (ref 70–99)
POTASSIUM: 3.6 meq/L — AB (ref 3.7–5.3)
Sodium: 141 mEq/L (ref 137–147)
Total Bilirubin: 0.2 mg/dL — ABNORMAL LOW (ref 0.3–1.2)
Total Protein: 5.4 g/dL — ABNORMAL LOW (ref 6.0–8.3)

## 2014-04-15 LAB — DIFFERENTIAL
BASOS ABS: 0 10*3/uL (ref 0.0–0.1)
Basophils Relative: 1 % (ref 0–1)
Eosinophils Absolute: 0.5 10*3/uL (ref 0.0–0.7)
Eosinophils Relative: 7 % — ABNORMAL HIGH (ref 0–5)
LYMPHS PCT: 19 % (ref 12–46)
Lymphs Abs: 1.2 10*3/uL (ref 0.7–4.0)
MONO ABS: 0.7 10*3/uL (ref 0.1–1.0)
MONOS PCT: 11 % (ref 3–12)
NEUTROS ABS: 3.9 10*3/uL (ref 1.7–7.7)
NEUTROS PCT: 62 % (ref 43–77)

## 2014-04-15 LAB — I-STAT CHEM 8, ED
BUN: 12 mg/dL (ref 6–23)
CALCIUM ION: 1.4 mmol/L — AB (ref 1.13–1.30)
CREATININE: 1 mg/dL (ref 0.50–1.10)
Chloride: 100 mEq/L (ref 96–112)
Glucose, Bld: 107 mg/dL — ABNORMAL HIGH (ref 70–99)
HCT: 37 % (ref 36.0–46.0)
Hemoglobin: 12.6 g/dL (ref 12.0–15.0)
Potassium: 3.2 mEq/L — ABNORMAL LOW (ref 3.7–5.3)
Sodium: 139 mEq/L (ref 137–147)
TCO2: 28 mmol/L (ref 0–100)

## 2014-04-15 LAB — I-STAT TROPONIN, ED: Troponin i, poc: 0 ng/mL (ref 0.00–0.08)

## 2014-04-15 LAB — APTT: APTT: 58 s — AB (ref 24–37)

## 2014-04-15 LAB — PROTIME-INR
INR: 3.31 — ABNORMAL HIGH (ref 0.00–1.49)
Prothrombin Time: 33.6 seconds — ABNORMAL HIGH (ref 11.6–15.2)

## 2014-04-15 LAB — I-STAT CREATININE, ED: CREATININE: 1.1 mg/dL (ref 0.50–1.10)

## 2014-04-15 MED ORDER — SENNOSIDES-DOCUSATE SODIUM 8.6-50 MG PO TABS: 1.0000 | ORAL_TABLET | Freq: Every evening | ORAL | Status: DC | PRN

## 2014-04-15 MED ORDER — DILTIAZEM HCL ER 180 MG PO CP24
180.0000 mg | ORAL_CAPSULE | Freq: Every day | ORAL | Status: DC
  Administered 2014-04-16 – 2014-04-18 (×3): 180 mg via ORAL
  Filled 2014-04-15 (×7): qty 1

## 2014-04-15 MED ORDER — FERROUS SULFATE 325 (65 FE) MG PO TABS
325.0000 mg | ORAL_TABLET | Freq: Two times a day (BID) | ORAL | Status: DC
  Administered 2014-04-16 – 2014-04-18 (×4): 325 mg via ORAL
  Filled 2014-04-15 (×5): qty 1

## 2014-04-15 MED ORDER — SODIUM CHLORIDE 0.9 % IJ SOLN
3.0000 mL | Freq: Two times a day (BID) | INTRAMUSCULAR | Status: DC
  Administered 2014-04-15 – 2014-04-17 (×5): 3 mL via INTRAVENOUS

## 2014-04-15 MED ORDER — LORATADINE 10 MG PO TABS
10.0000 mg | ORAL_TABLET | Freq: Every day | ORAL | Status: DC
  Administered 2014-04-16 – 2014-04-18 (×3): 10 mg via ORAL
  Filled 2014-04-15 (×3): qty 1

## 2014-04-15 MED ORDER — STROKE: EARLY STAGES OF RECOVERY BOOK
Freq: Once | Status: AC
  Administered 2014-04-15: 23:00:00
  Filled 2014-04-15: qty 1

## 2014-04-15 MED ORDER — PREDNISONE 5 MG PO TABS
10.0000 mg | ORAL_TABLET | ORAL | Status: DC
  Administered 2014-04-17: 10 mg via ORAL
  Filled 2014-04-15: qty 2

## 2014-04-15 MED ORDER — PANTOPRAZOLE SODIUM 40 MG PO TBEC
40.0000 mg | DELAYED_RELEASE_TABLET | Freq: Every day | ORAL | Status: DC
  Administered 2014-04-16 – 2014-04-18 (×3): 40 mg via ORAL
  Filled 2014-04-15 (×3): qty 1

## 2014-04-15 MED ORDER — ADULT MULTIVITAMIN W/MINERALS CH
1.0000 | ORAL_TABLET | Freq: Every day | ORAL | Status: DC
  Administered 2014-04-16 – 2014-04-18 (×3): 1 via ORAL
  Filled 2014-04-15 (×3): qty 1

## 2014-04-15 MED ORDER — FUROSEMIDE 80 MG PO TABS
80.0000 mg | ORAL_TABLET | Freq: Every day | ORAL | Status: DC
  Administered 2014-04-16 – 2014-04-18 (×3): 80 mg via ORAL
  Filled 2014-04-15 (×3): qty 1

## 2014-04-15 MED ORDER — SIMVASTATIN 5 MG PO TABS
10.0000 mg | ORAL_TABLET | Freq: Every morning | ORAL | Status: DC
  Administered 2014-04-16 – 2014-04-18 (×3): 10 mg via ORAL
  Filled 2014-04-15 (×3): qty 2

## 2014-04-15 MED ORDER — WARFARIN - PHARMACIST DOSING INPATIENT: Freq: Every day | Status: DC

## 2014-04-15 MED ORDER — POTASSIUM CHLORIDE ER 10 MEQ PO TBCR
10.0000 meq | EXTENDED_RELEASE_TABLET | Freq: Every day | ORAL | Status: DC
  Administered 2014-04-16 – 2014-04-18 (×3): 10 meq via ORAL
  Filled 2014-04-15 (×6): qty 1

## 2014-04-15 NOTE — ED Notes (Signed)
Pt in from home via [facility], per report home health RN came in to see pt today @ 16:00 & called the daughter & the daughter called EMS because she was not acting R, pt c/o R sided numbness from the R knee down, last seen normal was seen today @ 10:00 per the pts neighbor, per husband who is at bedside & very confused reports the pt has been in bed for a couple of day, pt alert to verbal stimuli, pt alert to person, disoriented to place, time & situation, pt presents with expressive aphasia, hx of CVA

## 2014-04-15 NOTE — ED Provider Notes (Signed)
CSN: [id number]     Arrival date & time 04/15/14  1759 History   First MD Initiated Contact with Patient 04/15/14 1802     Chief Complaint  Patient presents with  . Code Stroke   Patient is a 80 y.o. female presenting with neurologic complaint. The history is provided by the patient.  Neurologic Problem This is a recurrent problem. The current episode started today. The problem occurs constantly. The problem has been unchanged. Associated symptoms include numbness. Pertinent negatives include no abdominal pain, chest pain, fever, headaches, nausea, neck pain, rash, sore throat or vomiting. Nothing aggravates the symptoms. She has tried nothing for the symptoms. The treatment provided no relief.   Pt is an 80 yo F with a PMH of AF on coumadin, HTN, COPD, prior CVA (2009) with residual aphasia who presents by EMS as a CODE STROKE. Patient last known normal at 10AM. Complaining of RLE numbness from knee down. FSG and HR normal, in AFib on monitor.   Past Medical History  Diagnosis Date  . Atrial fibrillation   . Hypertension   . Arthritis   . GERD (gastroesophageal reflux disease)   . COPD (chronic obstructive pulmonary disease)   . PNA (pneumonia)   . Anemia due to GI blood loss 07/11/2011  . 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 Stroke 2009    aphasia and memory impairment as residual  . Hypoxia   . Atrial fibrillation, permanent, rate controlled on coumadin 05/10/2011  . Hypogammaglobulinemia 06/01/2012    Immunoglobulins 05/01/2012-normal IgG M., normal IgA,  low IgG [phone]).   . 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 CVA history 05/10/2011  . CHF (congestive heart failure) 19/14/7829    diastolic  . History of nuclear stress test 2009    normal dobutamine myoview   Past Surgical History  Procedure Laterality Date  . Hip fracture surgery    . Cholecystectomy    . Knee surgery    .  Cataract extraction    . Transthoracic echocardiogram  06/2011    EF 55-60%; mild LVH; LA & RA mod dilated; mod TR   Family History  Problem Relation Age of Onset  . Heart disease Mother   . Rectal cancer Mother   . Leukemia Father   . Heart failure Mother   . Stroke Mother   . Cancer Maternal Grandmother   . Hypertension Son   . Hypertension Daughter    History  Substance Use Topics  . Smoking status: Never Smoker   . Smokeless tobacco: Never Used  . Alcohol Use: No   OB History   Grav Para Term Preterm Abortions TAB SAB Ect Mult Living                 Review of Systems  Constitutional: Negative for fever.  HENT: Negative for rhinorrhea and sore throat.   Eyes: Negative for visual disturbance.  Respiratory: Negative for chest tightness and shortness of breath.   Cardiovascular: Negative for chest pain and palpitations.  Gastrointestinal: Negative for nausea, vomiting, abdominal pain and constipation.  Genitourinary: Negative for dysuria and hematuria.  Musculoskeletal: Negative for back pain and neck pain.  Skin: Negative for rash.  Neurological: Positive for speech difficulty (Remote) and numbness. Negative for dizziness and headaches.  Psychiatric/Behavioral: Negative for confusion.  All other systems reviewed and are negative.     Allergies  Amoxicillin and Penicillins  Home Medications   Prior to Admission  medications   Medication Sig Start Date End Date Taking? Authorizing Provider  diltiazem (DILACOR XR) 180 MG 24 hr capsule Take 180 mg by mouth daily before breakfast.    Yes Historical Provider, MD  ferrous sulfate 325 (65 FE) MG tablet Take 325 mg by mouth 2 (two) times daily with a meal.    Yes Historical Provider, MD  furosemide (LASIX) 80 MG tablet Take 80 mg by mouth daily.    Yes Historical Provider, MD  loratadine (ALLERGY) 10 MG tablet Take 10 mg by mouth daily.   Yes Historical Provider, MD  Multiple Vitamins-Minerals (MULTIVITAMINS THER.  W/MINERALS) TABS Take 1 tablet by mouth every evening.    Yes Historical Provider, MD  pantoprazole (PROTONIX) 40 MG tablet Take 40 mg by mouth daily.   Yes Historical Provider, MD  potassium chloride (K-DUR) 10 MEQ tablet Take 1 tablet (10 mEq total) by mouth daily. 01/08/14  Yes Kinnie Feil, MD  predniSONE (DELTASONE) 5 MG tablet Take 10 mg by mouth every other day.    Yes Historical Provider, MD  simvastatin (ZOCOR) 10 MG tablet Take 10 mg by mouth every morning.    Yes Historical Provider, MD  warfarin (COUMADIN) 4 MG tablet Take 6 mg by mouth daily.   Yes Historical Provider, MD   BP 103/74  Pulse 91  Temp(Src) 98.6 F (37 C) (Oral)  Resp 18  Ht 5\' 4"  (1.626 m)  Wt 140 lb 9.6 oz (63.776 kg)  BMI 24.12 kg/m2  SpO2 98% Physical Exam  Constitutional: She appears well-developed and well-nourished. No distress.  HENT:  Head: Normocephalic and atraumatic.  Mouth/Throat: Oropharynx is clear and moist.  Eyes: EOM are normal. Pupils are equal, round, and reactive to light.  Neck: Neck supple. No JVD present.  Cardiovascular: Normal rate, regular rhythm, normal heart sounds and intact distal pulses.  Exam reveals no gallop.   No murmur heard. Pulmonary/Chest: Effort normal and breath sounds normal. She has no wheezes. She has no rales.  Abdominal: Soft. She exhibits no distension. There is no tenderness.  Musculoskeletal: Normal range of motion. She exhibits no tenderness.  Neurological: She is alert. She is disoriented. No cranial nerve deficit. She exhibits normal muscle tone. She displays no seizure activity. GCS eye subscore is 4. GCS verbal subscore is 5. GCS motor subscore is 6.  Gait deferred. Expressive aphasia. Decreased sensation to right lower leg.   Skin: Skin is warm and dry. No rash noted.  Psychiatric: Her behavior is normal.    ED Course  Procedures none  Labs Review Labs Reviewed  PROTIME-INR - Abnormal; Notable for the following:    Prothrombin Time 33.6 (*)     INR 3.31 (*)    All other components within normal limits  APTT - Abnormal; Notable for the following:    aPTT 58 (*)    All other components within normal limits  CBC - Abnormal; Notable for the following:    Hemoglobin 10.8 (*)    HCT 35.2 (*)    RDW 16.3 (*)    All other components within normal limits  DIFFERENTIAL - Abnormal; Notable for the following:    Eosinophils Relative 7 (*)    All other components within normal limits  COMPREHENSIVE METABOLIC PANEL - Abnormal; Notable for the following:    Potassium 3.6 (*)    Glucose, Bld 105 (*)    Total Protein 5.4 (*)    Albumin 2.6 (*)    Alkaline Phosphatase 130 (*)  Total Bilirubin 0.2 (*)    GFR calc non Af Amer 52 (*)    GFR calc Af Amer 60 (*)    All other components within normal limits  I-STAT CHEM 8, ED - Abnormal; Notable for the following:    Potassium 3.2 (*)    Glucose, Bld 107 (*)    Calcium, Ion 1.40 (*)    All other components within normal limits  ETHANOL  URINE RAPID DRUG SCREEN (HOSP PERFORMED)  URINALYSIS, ROUTINE W REFLEX MICROSCOPIC  TROPONIN I  TSH  COMPREHENSIVE METABOLIC PANEL  CBC WITH DIFFERENTIAL  HEMOGLOBIN A1C  LIPID PANEL  PROTIME-INR  PRO B NATRIURETIC PEPTIDE  I-STAT TROPOININ, ED  I-STAT TROPOININ, ED  I-STAT CREATININE, ED    Imaging Review Ct Head Wo Contrast  04/15/2014   CLINICAL DATA:  80 year old hypertensive female with history of atrial fibrillation and prior stroke. Numbness right leg. Code stroke. Initial encounter.  EXAM: CT HEAD WITHOUT CONTRAST  TECHNIQUE: Contiguous axial images were obtained from the base of the skull through the vertex without intravenous contrast.  COMPARISON:  11/14/2012.  FINDINGS: No intracranial hemorrhage.  Remote large left hemispheric infarct with encephalomalacia left frontal and parietal lobe.  No CT evidence of large acute infarct.  Small vessel disease type changes.  Global atrophy without hydrocephalus.  Vascular calcifications.  No  intracranial mass lesion noted on this unenhanced exam.  Moderate mucosal thickening maxillary sinuses with mucosal thickening/ partial opacification ethmoid sinus air cells.  IMPRESSION: No intracranial hemorrhage.  Remote large left hemispheric infarct with encephalomalacia left frontal and parietal lobe.  No CT evidence of large acute infarct.  Small vessel disease type changes.  Global atrophy without hydrocephalus.  Moderate mucosal thickening maxillary sinuses with mucosal thickening/ partial opacification ethmoid sinus air cells.  These results were called by telephone at the time of interpretation on 04/15/2014 at 6:15 pm to Dr. Nicole Kindred who verbally acknowledged these results.   Electronically Signed   By: Chauncey Cruel M.D.   On: 04/15/2014 18:18   Dg Pelvis Portable  04/15/2014   CLINICAL DATA:  Recent pain.  Previous surgery right proximal femur  EXAM: PORTABLE PELVIS 1-2 VIEWS  COMPARISON:  None.  FINDINGS: Patient is status post screw and rod fixation in the proximal right femur. There is no acute fracture or dislocation. There is mild symmetric narrowing of both hip joints. There is vascular calcification in the pelvis.  IMPRESSION: Postoperative change right proximal femur. Mild symmetric narrowing both hip joints. No acute fracture or dislocation.   Electronically Signed   By: Lowella Grip M.D.   On: 04/15/2014 21:55   Dg Chest Port 1 View  04/15/2014   CLINICAL DATA:  Shortness of breath.  EXAM: PORTABLE CHEST - 1 VIEW  COMPARISON:  01/06/2014  FINDINGS: There is moderate cardiomegaly. Upper mediastinal contours are stable from prior. Lower mediastinal mass correlating with hiatal hernia. Chronic interstitial coarsening. There is no edema, consolidation, effusion, or pneumothorax. Remote right humeral neck fracture.  IMPRESSION: No active disease.   Electronically Signed   By: Jorje Guild M.D.   On: 04/15/2014 21:50     EKG Interpretation   Date/Time:  Tuesday April 15 2014  18:33:20 EDT Ventricular Rate:  86 PR Interval:    QRS Duration: 126 QT Interval:  405 QTC Calculation: 484 R Axis:   -48 Text Interpretation:  Atrial fibrillation Nonspecific IVCD with LAD  Inferior infarct, old Confirmed by Jeneen Rinks  MD, [place] ([id number]) on 04/15/2014  6:35:21 PM  MDM   Final diagnoses:  Right leg numbness    80 yo F presents with RLE numbness. CODE STROKE activated. Patient in AF with normal rate. Patient is inconsistent with her history about whether or not she continues to have numbness. CT head neg for acute bleed. Will need hospital admission for TIA workup. No tPA given. This remained stable in the ED. INR supratherapeutic. Hemoglobin 10.8. She is stable for transfer to the floor.  Case discussed with Dr. Jeneen Rinks.     Gustavus Bryant, MD 04/15/14 [phone]

## 2014-04-15 NOTE — H&P (Signed)
[facility] History and Physical  Angelica [name] YKD:[id number] DOB: 1922-07-18 DOA: 04/15/2014  Referring physician: ER physician. PCP: [name], MD  Chief Complaint: Right lower extremity pain and numbness.  HPI: Angelica [name] is a 80 y.o. female with history of previous CVA with resultant aphasia, atrial fibrillation on Coumadin, history of AV malformation and GI bleed, CHF and COPD was brought to the ER after patient was found to have numbness of the right lower extremity with pain. Most of the history is provided by the patient's [place]. Patient daughter states that patient was doing fine today morning but later in the day when another caregiver came patient was complaining of pain in the right lower extremity and also was found to have some numbness with decreased sensation. Patient also found it difficult to walk because of pain on doing so. Patient was brought to the ER and CT head was negative for anything acute and on-call neurologist was consulted and patient has been admitted for further management for possible CVA versus TIA. Patient is on Coumadin and is therapeutic. Patient's daughter stated that patient also was found to be mildly hypoxic but at this time patient is saturating 98% on room. Patient denies any chest pain or shortness of breath.   Review of Systems: As presented in the history of presenting illness, rest negative.  Past Medical History  Diagnosis Date  . Atrial fibrillation   . Hypertension   . Arthritis   . GERD (gastroesophageal reflux disease)   . COPD (chronic obstructive pulmonary disease)   . PNA (pneumonia)   . Anemia due to GI blood loss 07/11/2011  . 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 Stroke 2009    aphasia and memory impairment as residual  . Hypoxia   . Atrial fibrillation, permanent, rate controlled on  coumadin 05/10/2011  . Hypogammaglobulinemia 06/01/2012    Immunoglobulins 05/01/2012-normal IgG M., normal IgA,  low IgG [phone]).   . 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 CVA history 05/10/2011  . CHF (congestive heart failure) 39/76/7341    diastolic  . History of nuclear stress test 2009    normal dobutamine myoview   Past Surgical History  Procedure Laterality Date  . Hip fracture surgery    . Cholecystectomy    . Knee surgery    . Cataract extraction    . Transthoracic echocardiogram  06/2011    EF 55-60%; mild LVH; LA & RA mod dilated; mod TR   Social History:  reports that she has never smoked. She has never used smokeless tobacco. She reports that she does not drink alcohol or use illicit drugs. Where does patient live home. Can patient participate in ADLs? Yes.  Allergies  Allergen Reactions  . Amoxicillin Hives  . Penicillins Hives    Family History:  Family History  Problem Relation Age of Onset  . Heart disease Mother   . Rectal cancer Mother   . Leukemia Father   . Heart failure Mother   . Stroke Mother   . Cancer Maternal Grandmother   . Hypertension Son   . Hypertension Daughter       Prior to Admission medications   Medication Sig Start Date End Date Taking? Authorizing Provider  diltiazem (DILACOR XR) 180 MG 24 hr capsule Take 180 mg by mouth daily before breakfast.    Yes Historical Provider, MD  ferrous sulfate 325 (65 FE) MG tablet Take  325 mg by mouth 2 (two) times daily with a meal.    Yes Historical Provider, MD  furosemide (LASIX) 80 MG tablet Take 80 mg by mouth daily.    Yes Historical Provider, MD  loratadine (ALLERGY) 10 MG tablet Take 10 mg by mouth daily.   Yes Historical Provider, MD  Multiple Vitamins-Minerals (MULTIVITAMINS THER. W/MINERALS) TABS Take 1 tablet by mouth every evening.    Yes Historical Provider, MD  pantoprazole (PROTONIX) 40 MG tablet Take 40 mg by mouth daily.   Yes Historical Provider, MD  potassium chloride (K-DUR)  10 MEQ tablet Take 1 tablet (10 mEq total) by mouth daily. 01/08/14  Yes Kinnie Feil, MD  predniSONE (DELTASONE) 5 MG tablet Take 10 mg by mouth every other day.    Yes Historical Provider, MD  simvastatin (ZOCOR) 10 MG tablet Take 10 mg by mouth every morning.    Yes Historical Provider, MD  warfarin (COUMADIN) 4 MG tablet Take 6 mg by mouth daily.   Yes Historical Provider, MD    Physical Exam: Filed Vitals:   04/15/14 1845 04/15/14 1930 04/15/14 2018  BP:   103/74  Pulse: 64 68 91  Temp:   98.6 F (37 C)  TempSrc:   Oral  Resp: 21 20 18   Height:   5\' 4"  (1.626 m)  Weight:   63.776 kg (140 lb 9.6 oz)  SpO2: 95% 91% 98%     General:  Well-developed and nourished.  Eyes: Anicteric no pallor.  ENT: No discharge from the ears eyes nose mouth.  Neck: No mass felt.  Cardiovascular: S1-S2 heard.  Respiratory: No rhonchi or crepitations.  Abdomen: Soft nontender bowel sounds present. No guarding or rigidity.  Skin: No rash.  Musculoskeletal: No edema.  Psychiatric: Presently patient is oriented to her name.  Neurologic: Alert awake oriented to her name. Moves all extremities 5 x 5. No facial asymmetry. Tongue is midline.  Labs on Admission:  Basic Metabolic Panel:  Recent Labs Lab 04/15/14 1755 04/15/14 1807 04/15/14 1819  NA 141 139  --   K 3.6* 3.2*  --   CL 103 100  --   CO2 27  --   --   GLUCOSE 105* 107*  --   BUN 13 12  --   CREATININE 0.95 1.00 1.10  CALCIUM 9.3  --   --    Liver Function Tests:  Recent Labs Lab 04/15/14 1755  AST 21  ALT 12  ALKPHOS 130*  BILITOT 0.2*  PROT 5.4*  ALBUMIN 2.6*   No results found for this basename: LIPASE, AMYLASE,  in the last 168 hours No results found for this basename: AMMONIA,  in the last 168 hours CBC:  Recent Labs Lab 04/15/14 1755 04/15/14 1807  WBC 6.3  --   NEUTROABS 3.9  --   HGB 10.8* 12.6  HCT 35.2* 37.0  MCV 85.2  --   PLT 376  --    Cardiac Enzymes: No results found for this  basename: CKTOTAL, CKMB, CKMBINDEX, TROPONINI,  in the last 168 hours  BNP (last 3 results) No results found for this basename: PROBNP,  in the last 8760 hours CBG: No results found for this basename: GLUCAP,  in the last 168 hours  Radiological Exams on Admission: Ct Head Wo Contrast  04/15/2014   CLINICAL DATA:  80 year old hypertensive female with history of atrial fibrillation and prior stroke. Numbness right leg. Code stroke. Initial encounter.  EXAM: CT HEAD WITHOUT CONTRAST  TECHNIQUE:  Contiguous axial images were obtained from the base of the skull through the vertex without intravenous contrast.  COMPARISON:  11/14/2012.  FINDINGS: No intracranial hemorrhage.  Remote large left hemispheric infarct with encephalomalacia left frontal and parietal lobe.  No CT evidence of large acute infarct.  Small vessel disease type changes.  Global atrophy without hydrocephalus.  Vascular calcifications.  No intracranial mass lesion noted on this unenhanced exam.  Moderate mucosal thickening maxillary sinuses with mucosal thickening/ partial opacification ethmoid sinus air cells.  IMPRESSION: No intracranial hemorrhage.  Remote large left hemispheric infarct with encephalomalacia left frontal and parietal lobe.  No CT evidence of large acute infarct.  Small vessel disease type changes.  Global atrophy without hydrocephalus.  Moderate mucosal thickening maxillary sinuses with mucosal thickening/ partial opacification ethmoid sinus air cells.  These results were called by telephone at the time of interpretation on 04/15/2014 at 6:15 pm to Dr. Nicole Kindred who verbally acknowledged these results.   Electronically Signed   By: Chauncey Cruel M.D.   On: 04/15/2014 18:18   Dg Pelvis Portable  04/15/2014   CLINICAL DATA:  Recent pain.  Previous surgery right proximal femur  EXAM: PORTABLE PELVIS 1-2 VIEWS  COMPARISON:  None.  FINDINGS: Patient is status post screw and rod fixation in the proximal right femur. There is no  acute fracture or dislocation. There is mild symmetric narrowing of both hip joints. There is vascular calcification in the pelvis.  IMPRESSION: Postoperative change right proximal femur. Mild symmetric narrowing both hip joints. No acute fracture or dislocation.   Electronically Signed   By: Lowella Grip M.D.   On: 04/15/2014 21:55   Dg Chest Port 1 View  04/15/2014   CLINICAL DATA:  Shortness of breath.  EXAM: PORTABLE CHEST - 1 VIEW  COMPARISON:  01/06/2014  FINDINGS: There is moderate cardiomegaly. Upper mediastinal contours are stable from prior. Lower mediastinal mass correlating with hiatal hernia. Chronic interstitial coarsening. There is no edema, consolidation, effusion, or pneumothorax. Remote right humeral neck fracture.  IMPRESSION: No active disease.   Electronically Signed   By: Jorje Guild M.D.   On: 04/15/2014 21:50    EKG: Independently reviewed. A fibrillation.  Assessment/Plan Principal Problem:   Numbness of right lower extremity Active Problems:   Arteriovenous malformation of gastrointestinal tract   History of COPD   History of CHF (congestive heart failure)   Chronic atrial fibrillation   1. Right lower is in the numbness and pain - symptoms have resolved this time. Patient has been admitted to rule out any CVA or possible TIA. Patient is on Coumadin which is supratherapeutic. MRI/MRA brain 2-D echo and carotid Dopplers has been ordered. Further recommendations per neurologist. 2. CHF last EF measured was in April of 2014 55-60% - continue Lasix. Patient appears compensated. Closely follow daily weight and metabolic panel. 3. COPD - presently not wheezing. 4. Atrial fibrillation rate controlled - Coumadin per pharmacy. Continue rate limiting medications. 5. History of AVMs and GI bleed - closely follow CBC.  Code Status: Full code.  Family Communication: Patient's daughter.  Disposition Plan: Admit for observation.    Vaani Morren N. [facility] Pager [phone].  If 7PM-7AM, please contact night-coverage [email or web address] Password [email or web address] 04/15/2014, 10:18 PM

## 2014-04-15 NOTE — Progress Notes (Signed)
ANTICOAGULATION CONSULT NOTE - Initial Consult  Pharmacy Consult for warfarin Indication: atrial fibrillation  Allergies  Allergen Reactions  . Amoxicillin Hives  . Penicillins Hives    Patient Measurements: Height: 5\' 4"  (162.6 cm) Weight: 140 lb 9.6 oz (63.776 kg) IBW/kg (Calculated) : 54.7  Vital Signs: Temp: 98.6 F (37 C) (10/13 2018) Temp Source: Oral (10/13 2018) BP: 103/74 mmHg (10/13 2018) Pulse Rate: 91 (10/13 2018)  Labs:  Recent Labs  04/15/14 1755 04/15/14 1807 04/15/14 1819  HGB 10.8* 12.6  --   HCT 35.2* 37.0  --   PLT 376  --   --   APTT 58*  --   --   LABPROT 33.6*  --   --   INR 3.31*  --   --   CREATININE 0.95 1.00 1.10    Estimated Creatinine Clearance: 29.9 ml/min (by C-G formula based on Cr of 1.1).   Medical History: Past Medical History  Diagnosis Date  . Atrial fibrillation   . Hypertension   . Arthritis   . GERD (gastroesophageal reflux disease)   . COPD (chronic obstructive pulmonary disease)   . PNA (pneumonia)   . Anemia due to GI blood loss 07/11/2011  . 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 Stroke 2009    aphasia and memory impairment as residual  . Hypoxia   . Atrial fibrillation, permanent, rate controlled on coumadin 05/10/2011  . Hypogammaglobulinemia 06/01/2012    Immunoglobulins 05/01/2012-normal IgG M., normal IgA,  low IgG 344([phone]).   . 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 CVA history 05/10/2011  . CHF (congestive heart failure) 35/57/3220    diastolic  . History of nuclear stress test 2009    normal dobutamine myoview    Medications:  Prescriptions prior to admission  Medication Sig Dispense Refill  . diltiazem (DILACOR XR) 180 MG 24 hr capsule Take 180 mg by mouth daily before breakfast.       . ferrous sulfate 325 (65 FE) MG tablet Take 325 mg by mouth 2 (two) times daily with a meal.       . furosemide (LASIX)  80 MG tablet Take 80 mg by mouth daily.       Marland Kitchen loratadine (ALLERGY) 10 MG tablet Take 10 mg by mouth daily.      . Multiple Vitamins-Minerals (MULTIVITAMINS THER. W/MINERALS) TABS Take 1 tablet by mouth every evening.       . pantoprazole (PROTONIX) 40 MG tablet Take 40 mg by mouth daily.      . potassium chloride (K-DUR) 10 MEQ tablet Take 1 tablet (10 mEq total) by mouth daily.  30 tablet  0  . predniSONE (DELTASONE) 5 MG tablet Take 10 mg by mouth every other day.       . simvastatin (ZOCOR) 10 MG tablet Take 10 mg by mouth every morning.       . warfarin (COUMADIN) 4 MG tablet Take 6 mg by mouth daily.       Scheduled:  .  stroke: mapping our early stages of recovery book   Does not apply Once  . [START ON 04/16/2014] diltiazem  180 mg Oral QAC breakfast  . [START ON 04/16/2014] ferrous sulfate  325 mg Oral BID WC  . [START ON 04/16/2014] furosemide  80 mg Oral Daily  . [START ON 04/16/2014] loratadine  10 mg Oral Daily  . [START ON 04/16/2014] multivitamin with minerals  1 tablet Oral  Daily  . [START ON 04/16/2014] pantoprazole  40 mg Oral Daily  . [START ON 04/16/2014] potassium chloride  10 mEq Oral Daily  . [START ON 04/17/2014] predniSONE  10 mg Oral QODAY  . [START ON 04/16/2014] simvastatin  10 mg Oral q morning - 10a  . sodium chloride  3 mL Intravenous Q12H    Assessment: 80yo female presents with new onset numbness. Pharmacy is consulted to dose warfarin for atrial fibrillation. Pt takes warfarin 6mg  PO daily at home. Pt has a history of GIB and wants to keep tighter control of INR to 2-2.5. H/H and Plt are ok. INR is supratherapeutic at 3.31 on admission. Pt last took warfarin 04/14/14.  Goal of Therapy:  INR 2-2.5 due to GI bleed Monitor platelets by anticoagulation protocol: Yes   Plan:  Hold warfarin dose tonight Continue to monitor H&H and platelets and s/sx of bleeding Daily INR  Andrey Cota. Diona Foley, PharmD Clinical Pharmacist Pager [phone] 04/15/2014,10:29  PM

## 2014-04-15 NOTE — Consult Note (Signed)
Referring Physician: Alyson Locket    Chief Complaint: New onset numbness involving right leg.   HPI: Angelica [name] is an 80 y.o. female with a history of atrial fibrillation, hypertension, COPD, previous left MCA stroke and dementia, presenting with new onset numbness involving right lower extremity. Patient was last known well at 10 AM today. She's been on Coumadin for anticoagulation. INR today was 3.31. CT scan of her head showed old large left MCA infarction. No acute changes were noted. NIH stroke score was 3.   LSN: 10 AM on 04/15/2014 tPA Given: No: Beyond time under for treatment consideration; INR 3.31 mRankin:  Past Medical History  Diagnosis Date  . Atrial fibrillation   . Hypertension   . Arthritis   . GERD (gastroesophageal reflux disease)   . COPD (chronic obstructive pulmonary disease)   . PNA (pneumonia)   . Anemia due to GI blood loss 07/11/2011  . 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 Stroke 2009    aphasia and memory impairment as residual  . Hypoxia   . Atrial fibrillation, permanent, rate controlled on coumadin 05/10/2011  . Hypogammaglobulinemia 06/01/2012    Immunoglobulins 05/01/2012-normal IgG M., normal IgA,  low IgG [phone]).   . 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 CVA history 05/10/2011  . CHF (congestive heart failure) 32/35/5732    diastolic  . History of nuclear stress test 2009    normal dobutamine myoview    Family History  Problem Relation Age of Onset  . Heart disease Mother   . Rectal cancer Mother   . Leukemia Father   . Heart failure Mother   . Stroke Mother   . Cancer Maternal Grandmother   . Hypertension Son   . Hypertension Daughter      Medications: I have reviewed the patient's current medications.  ROS: Unavailable due to the patient's expressive aphasia and lack of reliable history from patient spouse.  Physical  Examination: There were no vitals taken for this visit.  Neurologic Examination: Mental Status: Alert, slightly confused with moderate expressive aphasia. Moderate difficulty following commands. Cranial Nerves: II-Visual fields were normal. III/IV/VI-Pupils were equal and reacted. Extraocular movements were full and conjugate.    V/VII-no facial numbness and no facial weakness. VIII-normal. X-no dysarthria. Motor: 5/5 bilaterally with normal tone and bulk with no focal weakness Sensory: Normal throughout. Deep Tendon Reflexes: 2+ and symmetric. Plantars: Mute bilaterally Cerebellar: Normal finger-to-nose testing.  Ct Head Wo Contrast  04/15/2014   CLINICAL DATA:  80 year old hypertensive female with history of atrial fibrillation and prior stroke. Numbness right leg. Code stroke. Initial encounter.  EXAM: CT HEAD WITHOUT CONTRAST  TECHNIQUE: Contiguous axial images were obtained from the base of the skull through the vertex without intravenous contrast.  COMPARISON:  11/14/2012.  FINDINGS: No intracranial hemorrhage.  Remote large left hemispheric infarct with encephalomalacia left frontal and parietal lobe.  No CT evidence of large acute infarct.  Small vessel disease type changes.  Global atrophy without hydrocephalus.  Vascular calcifications.  No intracranial mass lesion noted on this unenhanced exam.  Moderate mucosal thickening maxillary sinuses with mucosal thickening/ partial opacification ethmoid sinus air cells.  IMPRESSION: No intracranial hemorrhage.  Remote large left hemispheric infarct with encephalomalacia left frontal and parietal lobe.  No CT evidence of large acute infarct.  Small vessel disease type changes.  Global atrophy without hydrocephalus.  Moderate mucosal thickening maxillary sinuses with mucosal thickening/ partial opacification ethmoid sinus air  cells.  These results were called by telephone at the time of interpretation on 04/15/2014 at 6:15 pm to Dr. Nicole Kindred who  verbally acknowledged these results.   Electronically Signed   By: Chauncey Cruel M.D.   On: 04/15/2014 18:18    Assessment: 80 y.o. female with multiple risk factors for stroke as well as previous left MCA territory infarction presenting with possible subcortical left TIA or ischemic stroke.  Stroke Risk Factors - atrial fibrillation and hypertension  Plan: 1. HgbA1c, fasting lipid panel 2. MRI, MRA  of the brain without contrast 3. PT consult, OT consult, Speech consult 4. Echocardiogram 5. Carotid dopplers 6. Prophylactic therapy-Anticoagulation: Coumadin, Pharmacy management recommended. 7. Risk factor modification 8. Telemetry monitoring   C.R. Nicole Kindred, MD [facility]  [phone]  04/15/2014, 6:34 PM

## 2014-04-16 ENCOUNTER — Observation Stay (HOSPITAL_COMMUNITY): Payer: Medicare Other

## 2014-04-16 DIAGNOSIS — I639 Cerebral infarction, unspecified: Secondary | ICD-10-CM

## 2014-04-16 DIAGNOSIS — F0390 Unspecified dementia without behavioral disturbance: Secondary | ICD-10-CM

## 2014-04-16 DIAGNOSIS — Z7901 Long term (current) use of anticoagulants: Secondary | ICD-10-CM

## 2014-04-16 DIAGNOSIS — I059 Rheumatic mitral valve disease, unspecified: Secondary | ICD-10-CM

## 2014-04-16 DIAGNOSIS — Q2733 Arteriovenous malformation of digestive system vessel: Secondary | ICD-10-CM

## 2014-04-16 LAB — CBC WITH DIFFERENTIAL/PLATELET
Basophils Absolute: 0 10*3/uL (ref 0.0–0.1)
Basophils Relative: 1 % (ref 0–1)
EOS ABS: 0.5 10*3/uL (ref 0.0–0.7)
Eosinophils Relative: 6 % — ABNORMAL HIGH (ref 0–5)
HCT: 33.1 % — ABNORMAL LOW (ref 36.0–46.0)
Hemoglobin: 10.3 g/dL — ABNORMAL LOW (ref 12.0–15.0)
Lymphocytes Relative: 7 % — ABNORMAL LOW (ref 12–46)
Lymphs Abs: 0.6 10*3/uL — ABNORMAL LOW (ref 0.7–4.0)
MCH: 26.4 pg (ref 26.0–34.0)
MCHC: 31.1 g/dL (ref 30.0–36.0)
MCV: 84.9 fL (ref 78.0–100.0)
Monocytes Absolute: 0.9 10*3/uL (ref 0.1–1.0)
Monocytes Relative: 11 % (ref 3–12)
Neutro Abs: 6.2 10*3/uL (ref 1.7–7.7)
Neutrophils Relative %: 75 % (ref 43–77)
PLATELETS: 388 10*3/uL (ref 150–400)
RBC: 3.9 MIL/uL (ref 3.87–5.11)
RDW: 16.5 % — ABNORMAL HIGH (ref 11.5–15.5)
WBC: 8.2 10*3/uL (ref 4.0–10.5)

## 2014-04-16 LAB — COMPREHENSIVE METABOLIC PANEL
ALK PHOS: 119 U/L — AB (ref 39–117)
ALT: 10 U/L (ref 0–35)
AST: 15 U/L (ref 0–37)
Albumin: 2.2 g/dL — ABNORMAL LOW (ref 3.5–5.2)
Anion gap: 10 (ref 5–15)
BILIRUBIN TOTAL: 0.2 mg/dL — AB (ref 0.3–1.2)
BUN: 10 mg/dL (ref 6–23)
CHLORIDE: 105 meq/L (ref 96–112)
CO2: 28 meq/L (ref 19–32)
CREATININE: 0.84 mg/dL (ref 0.50–1.10)
Calcium: 8.7 mg/dL (ref 8.4–10.5)
GFR calc Af Amer: 69 mL/min — ABNORMAL LOW (ref >90)
GFR, EST NON AFRICAN AMERICAN: 60 mL/min — AB (ref >90)
Glucose, Bld: 87 mg/dL (ref 70–99)
POTASSIUM: 3.4 meq/L — AB (ref 3.7–5.3)
Sodium: 143 mEq/L (ref 137–147)
Total Protein: 5.1 g/dL — ABNORMAL LOW (ref 6.0–8.3)

## 2014-04-16 LAB — URINALYSIS, ROUTINE W REFLEX MICROSCOPIC
Bilirubin Urine: NEGATIVE
Glucose, UA: NEGATIVE mg/dL
Ketones, ur: NEGATIVE mg/dL
Nitrite: NEGATIVE
Protein, ur: NEGATIVE mg/dL
SPECIFIC GRAVITY, URINE: 1.017 (ref 1.005–1.030)
UROBILINOGEN UA: 0.2 mg/dL (ref 0.0–1.0)
pH: 6 (ref 5.0–8.0)

## 2014-04-16 LAB — URINE MICROSCOPIC-ADD ON

## 2014-04-16 LAB — RAPID URINE DRUG SCREEN, HOSP PERFORMED
Amphetamines: NOT DETECTED
BARBITURATES: NOT DETECTED
Benzodiazepines: NOT DETECTED
Cocaine: NOT DETECTED
OPIATES: NOT DETECTED
Tetrahydrocannabinol: NOT DETECTED

## 2014-04-16 LAB — PROTIME-INR
INR: 2.91 — ABNORMAL HIGH (ref 0.00–1.49)
PROTHROMBIN TIME: 30.4 s — AB (ref 11.6–15.2)

## 2014-04-16 LAB — TROPONIN I: Troponin I: <0.3 ng/mL (ref <0.30)

## 2014-04-16 LAB — LIPID PANEL
Cholesterol: 125 mg/dL (ref 0–200)
HDL: 52 mg/dL (ref >39)
LDL Cholesterol: 56 mg/dL (ref 0–99)
Total CHOL/HDL Ratio: 2.4 RATIO
Triglycerides: 83 mg/dL (ref <150)
VLDL: 17 mg/dL (ref 0–40)

## 2014-04-16 LAB — PRO B NATRIURETIC PEPTIDE: PRO B NATRI PEPTIDE: 1258 pg/mL — AB (ref 0–450)

## 2014-04-16 LAB — HEMOGLOBIN A1C
HEMOGLOBIN A1C: 5 % (ref <5.7)
Mean Plasma Glucose: 97 mg/dL (ref <117)

## 2014-04-16 LAB — MAGNESIUM: MAGNESIUM: 1.9 mg/dL (ref 1.5–2.5)

## 2014-04-16 LAB — TSH: TSH: 4.11 u[IU]/mL (ref 0.350–4.500)

## 2014-04-16 MED ORDER — POTASSIUM CHLORIDE 10 MEQ/100ML IV SOLN
10.0000 meq | INTRAVENOUS | Status: AC
  Administered 2014-04-16 (×2): 10 meq via INTRAVENOUS
  Filled 2014-04-16 (×3): qty 100

## 2014-04-16 MED ORDER — WARFARIN - PHARMACIST DOSING INPATIENT: Freq: Every day | Status: DC

## 2014-04-16 MED ORDER — VITAMIN K1 10 MG/ML IJ SOLN
10.0000 mg | Freq: Once | INTRAVENOUS | Status: AC
  Administered 2014-04-17: 10 mg via INTRAVENOUS
  Filled 2014-04-16: qty 1

## 2014-04-16 NOTE — Evaluation (Signed)
Speech Language Pathology Evaluation Patient Details Name: Angelica [name] MRN: [id number] DOB: January 24, 1923 Today's Date: 04/16/2014 Time: 0940-1000 SLP Time Calculation (min): 20 min  Problem List:  Patient Active Problem List   Diagnosis Date Noted  . TIA (transient ischemic attack) 04/15/2014  . Numbness of right lower extremity 04/15/2014  . History of COPD 04/15/2014  . History of CHF (congestive heart failure) 04/15/2014  . Chronic atrial fibrillation 04/15/2014  . Cellulitis 01/06/2014  . Cellulitis of leg 01/06/2014  . Cellulitis of left lower extremity 01/06/2014  . Chronic anticoagulation 10/07/2012  . Acute on chronic diastolic CHF (congestive heart failure), NYHA class 2 10/06/2012  . Hypogammaglobulinemia 06/01/2012  . Diarrhea 10/13/2011  . Hypokalemia 10/13/2011  . Weakness generalized 10/13/2011  . Allergic rhinitis, seasonal 09/20/2011  . Anemia due to GI blood loss 07/11/2011  . 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 CHF (congestive heart failure) 06/14/2011  . COPD  06/14/2011  . Atrial fibrillation, permanent, rate controlled on coumadin 05/10/2011  . CVA history 05/10/2011   Past Medical History:  Past Medical History  Diagnosis Date  . Atrial fibrillation   . Hypertension   . Arthritis   . GERD (gastroesophageal reflux disease)   . COPD (chronic obstructive pulmonary disease)   . PNA (pneumonia)   . Anemia due to GI blood loss 07/11/2011  . 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 Stroke 2009    aphasia and memory impairment as residual  . Hypoxia   . Atrial fibrillation, permanent, rate controlled on coumadin 05/10/2011  . Hypogammaglobulinemia 06/01/2012    Immunoglobulins 05/01/2012-normal IgG M., normal IgA,  low IgG  344([phone]).   . 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 CVA history 05/10/2011  . CHF (congestive heart failure) 01/12/1974    diastolic  . History of nuclear stress test 2009    normal dobutamine myoview   Past Surgical History:  Past Surgical History  Procedure Laterality Date  . Hip fracture surgery    . Cholecystectomy    . Knee surgery    . Cataract extraction    . Transthoracic echocardiogram  06/2011    EF 55-60%; mild LVH; LA & RA mod dilated; mod TR   HPI:  80 y.o. female with multiple risk factors for stroke as well as previous left MCA territory infarction presenting with possible subcortical left TIA or ischemic stroke. Failed RN stroke swallow due to wet vocal quality. No history of being seen by SLP in chart.    Assessment / Plan / Recommendation Clinical Impression  Pt demonstrates cognitive linguistic function consistent with dtrs report of baseline aphasia from prior left CVA. Pt is alert, oriented via y/n questions. No acute f/u needed given baseline function. Will continue to follow for swallowing.     SLP Assessment       Follow Up Recommendations  24 hour supervision/assistance    Frequency and Duration min 2x/week      Pertinent Vitals/Pain Pain Assessment: No/denies pain   SLP Goals  Progression toward goals: Progressing toward goals  SLP Evaluation Prior Functioning  Cognitive/Linguistic Baseline: Baseline deficits Baseline deficit details: aphasia Type of Home: House  Lives With: Spouse Available Help at Discharge: Family   Cognition  Overall Cognitive Status: History of cognitive impairments - at baseline Orientation Level: Oriented to person;Oriented to place;Oriented to situation Attention: Sustained  Sustained Attention: Appears intact Memory:  (UTS due to langauge deficits) Awareness: Appears intact Safety/Judgment: Appears intact    Comprehension  Auditory Comprehension Overall Auditory Comprehension: Impaired at baseline     Expression Verbal Expression Overall Verbal Expression: Impaired at baseline   Oral / Motor Oral Motor/Sensory Function Overall Oral Motor/Sensory Function: Appears within functional limits for tasks assessed Motor Speech Overall Motor Speech: Appears within functional limits for tasks assessed   GO Functional Assessment Tool Used:  (clinical judgement) Functional Limitations: Swallowing Swallow Current Status (V6720): At least 1 percent but less than 20 percent impaired, limited or restricted Swallow Goal Status [phone]): At least 1 percent but less than 20 percent impaired, limited or restricted   Adrion Menz, [name] 04/16/2014, 10:54 AM

## 2014-04-16 NOTE — Progress Notes (Signed)
Radiologist paged this NP to relay findings of MRI/MRA brain. No acute CVA noted. Subacute 52mm SDH without mass effect or bleeding noted. Stable meningioma as compared with MRI 2009. No proximal vessel occlusion. Pt is presently anti coagulated for a hx of Afib. Came in with supra therapeutic INR. Coumadin is on hold until INR back to normal. INR today 2.9. Given MRI findings, this NP spoke to Dr. Humphrey Rolls of [facility] about reversing the INR vs continuing to hold Coumadin and letting INR drift back to normal without reversal. Given no active bleeding or mass effect on MRI, advised to just watch INR for now and continue to hold Coumadin without reversal. Will continue neuro checks. Would consider d/c of Coumadin long term given this finding and hx GIB in this 80 yo lady. Will report to oncoming attending. At this time, this NP sees no need to call neurosurgery given stability of MRI and defer consult to attending in am.  Clance Boll, NP [facility]

## 2014-04-16 NOTE — Evaluation (Signed)
Clinical/Bedside Swallow Evaluation Patient Details  Name: Angelica [name] MRN: [id number] Date of Birth: 1922-11-10  Today's Date: 04/16/2014 Time: 0940-1000 SLP Time Calculation (min): 20 min  Past Medical History:  Past Medical History  Diagnosis Date  . Atrial fibrillation   . Hypertension   . Arthritis   . GERD (gastroesophageal reflux disease)   . COPD (chronic obstructive pulmonary disease)   . PNA (pneumonia)   . Anemia due to GI blood loss 07/11/2011  . 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 Stroke 2009    aphasia and memory impairment as residual  . Hypoxia   . Atrial fibrillation, permanent, rate controlled on coumadin 05/10/2011  . Hypogammaglobulinemia 06/01/2012    Immunoglobulins 05/01/2012-normal IgG M., normal IgA,  low IgG 344([id number]).   . 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 CVA history 05/10/2011  . CHF (congestive heart failure) 78/29/5621    diastolic  . History of nuclear stress test 2009    normal dobutamine myoview   Past Surgical History:  Past Surgical History  Procedure Laterality Date  . Hip fracture surgery    . Cholecystectomy    . Knee surgery    . Cataract extraction    . Transthoracic echocardiogram  06/2011    EF 55-60%; mild LVH; LA & RA mod dilated; mod TR   HPI:  80 y.o. female with multiple risk factors for stroke as well as previous left MCA territory infarction presenting with possible subcortical left TIA or ischemic stroke. Failed RN stroke swallow due to wet vocal quality. No history of being seen by SLP in chart.    Assessment / Plan / Recommendation Aspiration Risk  Pt demonstrates mild evidence of dysphagia with a brief oral holding behavior with thin liquids. She also has mild wet vocal quality with a throat clear following solids and liquids. Suspect mild residue with penetration and sensation vs reflux related sensation. Likely this  is baseline as dtr reports occasional cough with meals (once a week) at baseline as well as a history of COPD and reflux with no history of recurrent pna. Recommend initiating a regular thin diet with further observation of tolerance after MRI is complete. Will provide further education regarding precautions to pt and dtr.     Diet Recommendation Regular;Thin liquid   Liquid Administration via: Cup Medication Administration: Whole meds with liquid Supervision: Patient able to self feed;Intermittent supervision to cue for compensatory strategies Compensations: Slow rate;Small sips/bites Postural Changes and/or Swallow Maneuvers: Seated upright 90 degrees;Upright 30-60 min after meal    Other  Recommendations Oral Care Recommendations: Oral care BID   Follow Up Recommendations  24 hour supervision/assistance    Frequency and Duration min 2x/week  1 week   Pertinent Vitals/Pain NA    SLP Swallow Goals     Swallow Study Prior Functional Status       General HPI: 80 y.o. female with multiple risk factors for stroke as well as previous left MCA territory infarction presenting with possible subcortical left TIA or ischemic stroke. Failed RN stroke swallow due to wet vocal quality. No history of being seen by SLP in chart.  Type of Study: Bedside swallow evaluation Previous Swallow Assessment: none  Diet Prior to this Study: NPO Temperature Spikes Noted: No Respiratory Status: Nasal cannula Behavior/Cognition: Alert;Cooperative;Requires cueing Oral Cavity - Dentition: Dentures, top Self-Feeding Abilities: Able to feed self Patient Positioning: Upright in bed Baseline Vocal Quality: Clear Volitional Cough: Cognitively unable  to elicit Volitional Swallow: Unable to elicit    Oral/Motor/Sensory Function Overall Oral Motor/Sensory Function: Appears within functional limits for tasks assessed   Ice Chips     Thin Liquid Thin Liquid: Impaired Presentation: Cup;Straw;Self Fed Oral  Phase Functional Implications: Oral holding;Prolonged oral transit Pharyngeal  Phase Impairments: Suspected delayed Swallow;Wet Vocal Quality;Throat Clearing - Delayed    Nectar Thick Nectar Thick Liquid: Not tested   Honey Thick Honey Thick Liquid: Not tested   Puree Puree: Impaired Presentation: Spoon Oral Phase Functional Implications: Prolonged oral transit Pharyngeal Phase Impairments: Suspected delayed Swallow;Throat Clearing - Delayed;Wet Vocal Quality   Solid   GO    Solid: Impaired Presentation: Self Fed Pharyngeal Phase Impairments: Suspected delayed Swallow;Wet Vocal Quality;Throat Clearing - Delayed      Herbie Baltimore, MA CCC-SLP [phone]  Ivon Oelkers, [name] 04/16/2014,10:48 AM

## 2014-04-16 NOTE — Progress Notes (Signed)
Patient arrived around 2000 10/13 she was confused and complaining of pain in lower extremities bilaterally, also exhibits expressive aphasia, heart rate is irregular with Afib arhythmia. Well continue to monitor and advise if condition worsens.

## 2014-04-16 NOTE — Progress Notes (Addendum)
[facility] PROGRESS NOTE  Angelica [name] ZJQ:[id number] DOB: 09-Sep-1922 DOA: 04/15/2014 PCP: [name], MD  Assessment/Plan: 1. Subdural hematoma -MRI showing not showing no acute infarct -Radiology reporting a subacute subdural hematoma along the right cerebral convexity measuring up to 4 mm -Initial CT scan of brain, however, did not reveal evidence of acute intracranial hemorrhage.   -Question if this could be chronic bleed  2.  Atrial fibrillation -Patient is rate controlled, with ventricular rates in the 60s to 80s -Have been chronically anticoagulated with Coumadin presenting with INR of 3.31 -Pharmacy has been consulted for warfarin management -Continue diltiazem 180 mg by mouth daily  3.  History of AVM/GI bleed -Presented with INR of 3.31, CBC showing hemoglobin 10.3.  -Remains hemodynamically stable, no evidence of GI bleed currently  4. Dyslipidemia -Continue statin therapy  5.  Hypertension. -Blood pressure stable, continue diltiazem  Code Status: Full code Family Communication: Spoke with daughter present at bedside Disposition Plan: PT recommending SNF placement   Consultants:  Neurology   HPI/Subjective: Patient is a pleasant 80 year old female with a history of atrial fibrillation, hypertension, history of CVA, admitted to the medicine service on 04/15/2014, presenting with complaints of numbness involving right lower extremity. She was initially worked up with a CT scan of brain which did not show evidence of acute intracranial changes. She was placed on a TIA protocol  Objective: Filed Vitals:   04/16/14 1422  BP: 117/77  Pulse: 68  Temp: 98.7 F (37.1 C)  Resp: 16    Intake/Output Summary (Last 24 hours) at 04/16/14 1454 Last data filed at 04/15/14 2311  Gross per 24 hour  Intake      3 ml  Output      0 ml  Net      3 ml   Filed Weights   04/15/14 2018  Weight: 63.776 kg (140 lb 9.6 oz)    Exam:   General:  Patient is  in no acute distress, awake alert oriented to time and place  Cardiovascular: Regular rate and rhythm normal S1-S2  Respiratory: Normal respiratory effort, lungs clear to auscultation  Abdomen: Soft nontender not a  Musculoskeletal: No edema  Neurological: Showed 5 of 5 muscle strength to bilateral upper and lower extremities, reporting resolution to numbness involving right lower extremity  Data Reviewed: Basic Metabolic Panel:  Recent Labs Lab 04/15/14 1755 04/15/14 1807 04/15/14 1819 04/16/14 0610  NA 141 139  --  143  K 3.6* 3.2*  --  3.4*  CL 103 100  --  105  CO2 27  --   --  28  GLUCOSE 105* 107*  --  87  BUN 13 12  --  10  CREATININE 0.95 1.00 1.10 0.84  CALCIUM 9.3  --   --  8.7  MG  --   --   --  1.9   Liver Function Tests:  Recent Labs Lab 04/15/14 1755 04/16/14 0610  AST 21 15  ALT 12 10  ALKPHOS 130* 119*  BILITOT 0.2* 0.2*  PROT 5.4* 5.1*  ALBUMIN 2.6* 2.2*   No results found for this basename: LIPASE, AMYLASE,  in the last 168 hours No results found for this basename: AMMONIA,  in the last 168 hours CBC:  Recent Labs Lab 04/15/14 1755 04/15/14 1807 04/16/14 0610  WBC 6.3  --  8.2  NEUTROABS 3.9  --  6.2  HGB 10.8* 12.6 10.3*  HCT 35.2* 37.0 33.1*  MCV 85.2  --  84.9  PLT  376  --  388   Cardiac Enzymes:  Recent Labs Lab 04/15/14 2333  TROPONINI <0.30   BNP (last 3 results)  Recent Labs  04/15/14 2333  PROBNP 1258.0*   CBG: No results found for this basename: GLUCAP,  in the last 168 hours  No results found for this or any previous visit (from the past 240 hour(s)).   Studies: Ct Head Wo Contrast  04/15/2014   CLINICAL DATA:  80 year old hypertensive female with history of atrial fibrillation and prior stroke. Numbness right leg. Code stroke. Initial encounter.  EXAM: CT HEAD WITHOUT CONTRAST  TECHNIQUE: Contiguous axial images were obtained from the base of the skull through the vertex without intravenous contrast.   COMPARISON:  11/14/2012.  FINDINGS: No intracranial hemorrhage.  Remote large left hemispheric infarct with encephalomalacia left frontal and parietal lobe.  No CT evidence of large acute infarct.  Small vessel disease type changes.  Global atrophy without hydrocephalus.  Vascular calcifications.  No intracranial mass lesion noted on this unenhanced exam.  Moderate mucosal thickening maxillary sinuses with mucosal thickening/ partial opacification ethmoid sinus air cells.  IMPRESSION: No intracranial hemorrhage.  Remote large left hemispheric infarct with encephalomalacia left frontal and parietal lobe.  No CT evidence of large acute infarct.  Small vessel disease type changes.  Global atrophy without hydrocephalus.  Moderate mucosal thickening maxillary sinuses with mucosal thickening/ partial opacification ethmoid sinus air cells.  These results were called by telephone at the time of interpretation on 04/15/2014 at 6:15 pm to Dr. Nicole Kindred who verbally acknowledged these results.   Electronically Signed   By: Chauncey Cruel M.D.   On: 04/15/2014 18:18   Dg Pelvis Portable  04/15/2014   CLINICAL DATA:  Recent pain.  Previous surgery right proximal femur  EXAM: PORTABLE PELVIS 1-2 VIEWS  COMPARISON:  None.  FINDINGS: Patient is status post screw and rod fixation in the proximal right femur. There is no acute fracture or dislocation. There is mild symmetric narrowing of both hip joints. There is vascular calcification in the pelvis.  IMPRESSION: Postoperative change right proximal femur. Mild symmetric narrowing both hip joints. No acute fracture or dislocation.   Electronically Signed   By: Lowella Grip M.D.   On: 04/15/2014 21:55   Dg Chest Port 1 View  04/15/2014   CLINICAL DATA:  Shortness of breath.  EXAM: PORTABLE CHEST - 1 VIEW  COMPARISON:  01/06/2014  FINDINGS: There is moderate cardiomegaly. Upper mediastinal contours are stable from prior. Lower mediastinal mass correlating with hiatal hernia.  Chronic interstitial coarsening. There is no edema, consolidation, effusion, or pneumothorax. Remote right humeral neck fracture.  IMPRESSION: No active disease.   Electronically Signed   By: Jorje Guild M.D.   On: 04/15/2014 21:50    Scheduled Meds: . diltiazem  180 mg Oral QAC breakfast  . ferrous sulfate  325 mg Oral BID WC  . furosemide  80 mg Oral Daily  . loratadine  10 mg Oral Daily  . multivitamin with minerals  1 tablet Oral Daily  . pantoprazole  40 mg Oral Daily  . potassium chloride  10 mEq Oral Daily  . [START ON 04/17/2014] predniSONE  10 mg Oral QODAY  . simvastatin  10 mg Oral q morning - 10a  . sodium chloride  3 mL Intravenous Q12H  . Warfarin - Pharmacist Dosing Inpatient   Does not apply q1800   Continuous Infusions:   Principal Problem:   Numbness of right lower extremity Active Problems:  Arteriovenous malformation of gastrointestinal tract   History of COPD   History of CHF (congestive heart failure)   Chronic atrial fibrillation    Time spent: 35 min    Kelvin Cellar  [facility] Pager [phone]. If 7PM-7AM, please contact night-coverage at [email or web address], password [facility] 04/16/2014, 2:54 PM  LOS: 1 day

## 2014-04-16 NOTE — Progress Notes (Signed)
UR completed 

## 2014-04-16 NOTE — Clinical Social Work Placement (Addendum)
Clinical Social Work Department CLINICAL SOCIAL WORK PLACEMENT NOTE 04/16/2014  Patient:  Angelica [name], Angelica [name]  Account Number:  [id number] Admit date:  04/15/2014  Clinical Social Worker:  Glendon Axe, CLINICAL SOCIAL WORKER  Date/time:  04/16/2014 01:02 PM  Clinical Social Work is seeking post-discharge placement for this patient at the following level of care:   [facility]   (*CSW will update this form in Epic as items are completed)   04/16/2014  Patient/family provided with [place] Department of Clinical Social Work's list of facilities offering this level of care within the geographic area requested by the patient (or if unable, by the patient's family).  04/16/2014  Patient/family informed of their freedom to choose among providers that offer the needed level of care, that participate in Medicare, Medicaid or managed care program needed by the patient, have an available bed and are willing to accept the patient.  04/16/2014  Patient/family informed of [facility]' ownership interest in [facility], as well as of the fact that they are under no obligation to receive care at this facility.  PASARR submitted to EDS on 04/16/2014  PASARR number received on 04/17/2014  FL2 transmitted to all facilities in geographic area requested by pt/family on 04/17/2014 FL2 transmitted to all facilities within larger geographic area on   Patient informed that his/her managed care company has contracts with or will negotiate with  certain facilities, including the following: Yes.   Patient/family informed of bed offers received:  04/17/2014 Patient chooses bed at  Physician recommends and patient chooses bed at    Patient to be transferred to  on   Patient to be transferred to facility by  Patient and family notified of transfer on  Name of family member notified:    The following physician request were entered in Epic:   Additional Comments:   Glendon Axe, MSW,  [place] [phone] 04/16/2014 1:04 PM

## 2014-04-16 NOTE — Care Management Note (Addendum)
  Page 1 of 1   04/16/2014     11:26:26 AM CARE MANAGEMENT NOTE 04/16/2014  Patient:  Angelica [name], Angelica [name]   Account Number:  [id number]  Date Initiated:  04/16/2014  Documentation initiated by:  Lorne Skeens  Subjective/Objective Assessment:   Patient was admitted for TIA work-up. Lives at home. Patient's daughter Kandis Fantasia is caregiver 4 days a week.     Action/Plan:   Will follow for discharge needs pending PT/OT evals and physician orders.   Anticipated DC Date:     Anticipated DC Plan:    In-house referral  Clinical Social Worker         Choice offered to / List presented to:             Status of service:  In process, will continue to follow Medicare Important Message given?   (If response is "NO", the following Medicare IM given date fields will be blank) Date Medicare IM given:   Medicare IM given by:   Date Additional Medicare IM given:   Additional Medicare IM given by:    Discharge Disposition:    Per UR Regulation:    If discussed at Long Length of Stay Meetings, dates discussed:    Comments:  04/16/14 [place], MSN, CM- Met with patient's daughter [name], per her request, to discuss discharge planning.  Daughter was very frustrated over 2 previous hospital discharges, one where patient was sent home because she did not qualify for SNF and another where she went to a SNF where it was felt the care was poor.  CM and daughter discussed the Medicare guidelines for determining SNF eligibility. Daughter is aware that patient is currently placed in observation status pending results of tests.  Daughter verbalized understanding of the 3-day qualifying stay.  Per daughter, patient is currently unable to ambulate and family would prefer SNF at private pay (if patient does not meet Medicare Guidelines) vs home.  She is requesting to speak with the CSW at this time to discuss local facilities and arrange tours. CSW notified.

## 2014-04-16 NOTE — Progress Notes (Signed)
Received prescreen request from PT for possible inpatient rehab referral. I have reviewed pt's case and noted that pt was admitted with numbness of R LE. Pt is in observation status. At this time, she would not meet the medical criteria for acute inpatient rehab as she is in observation status.  Please call me with any questions. Thanks.   Nanetta Batty, PT Rehabilitation Admissions Coordinator [phone]

## 2014-04-16 NOTE — Progress Notes (Signed)
VASCULAR LAB PRELIMINARY  PRELIMINARY  PRELIMINARY  PRELIMINARY  Carotid duplex  completed.    Preliminary report:  Bilateral:  1-39% ICA stenosis.  Vertebral artery flow is antegrade.      Angelica [name], RVT 04/16/2014, 3:56 PM

## 2014-04-16 NOTE — Clinical Social Work Psychosocial (Signed)
Clinical Social Work Department BRIEF PSYCHOSOCIAL ASSESSMENT 04/16/2014  Patient:  Angelica [name], Angelica [name]     Account Number:  [id number]     Admit date:  04/15/2014  Clinical Social Worker:  Glendon Axe, CLINICAL SOCIAL WORKER  Date/Time:  04/16/2014 11:00 AM  Referred by:  Physician  Date Referred:  04/16/2014 Referred for  SNF Placement   Other Referral:   Interview type:  Other - See comment Other interview type:   CSW spoke with pt's daughter, [name] at bedside.    PSYCHOSOCIAL DATA Living Status:  FAMILY Admitted from facility:   Level of care:   Primary support name:  Gardiner Barefoot [phone] Primary support relationship to patient:  CHILD, ADULT Degree of support available:   Strong    CURRENT CONCERNS Current Concerns  Post-Acute Placement   Other Concerns:    SOCIAL WORK ASSESSMENT / PLAN Clinical Social Worker spoke with pt's daughter in reference to post-acute placement for SNF when pt is medically stable. Pt's daughter expressed concerns of negative experience with pt being at SNF in the past and wishes for pt not to return to specific SNF. Pt's daughter inquired insurance coverage for SNF placement and requested information for SNF ratings online. CSW advised pt to view SNF ratings online/CSW to provide appropriate website. CSW to follow up with pt and pt's daughter to provide continued support and facilitate pt's discharge needs when medically ready.   Assessment/plan status:  Psychosocial Support/Ongoing Assessment of Needs Other assessment/ plan:   Information/referral to community resources:   CSW provided SNF list and explained SNF process to pt's daughter.    PATIENT'S/FAMILY'S RESPONSE TO PLAN OF CARE: Pt lying in bed, alert and disoriented. Pt's daughter agreeable to SNF placement. Pt's daughter reported she would review SNF rating and tour desired SNF's in the neear future. CSW remains available for support.     Glendon Axe, MSW, LCSWA [phone] 04/16/2014 1:01 PM

## 2014-04-16 NOTE — Plan of Care (Addendum)
Noted pt MRI report about subacute small SDH without mass effect. However, CT non con did not show any SDH. Not sure the SDH is chronic or subacute. However, her INR this am was 2.95 and daughter stated that her INR target is 1.8-2.2 due to GI bleeding. Will continue to hold off coumadin and reverse with VitK 10mg  iv and check INR in am.   So far pt no neuro changes and there is no life-threatening bleeding so aggressive reversal with Kcentral or FFP is not needed. Will follow up with INR in am. INR around 1.8 is OK from my standpoint.   Rosalin Hawking, MD PhD Stroke Neurology 04/16/2014 10:25 PM

## 2014-04-16 NOTE — Progress Notes (Signed)
[facility] for warfarin Indication: atrial fibrillation  Allergies  Allergen Reactions  . Amoxicillin Hives  . Penicillins Hives    Patient Measurements: Height: 5\' 4"  (162.6 cm) Weight: 140 lb 9.6 oz (63.776 kg) IBW/kg (Calculated) : 54.7  Vital Signs: Temp: 98.4 F (36.9 C) (10/14 0920) Temp Source: Oral (10/14 0920) BP: 113/50 mmHg (10/14 0920) Pulse Rate: 81 (10/14 0920)  Labs:  Recent Labs  04/15/14 1755 04/15/14 1807 04/15/14 1819 04/15/14 2333 04/16/14 0610  HGB 10.8* 12.6  --   --  10.3*  HCT 35.2* 37.0  --   --  33.1*  PLT 376  --   --   --  388  APTT 58*  --   --   --   --   LABPROT 33.6*  --   --   --  30.4*  INR 3.31*  --   --   --  2.91*  CREATININE 0.95 1.00 1.10  --  0.84  TROPONINI  --   --   --  <0.30  --     Estimated Creatinine Clearance: 39.2 ml/min (by C-G formula based on Cr of 0.84).   Medical History: Past Medical History  Diagnosis Date  . Atrial fibrillation   . Hypertension   . Arthritis   . GERD (gastroesophageal reflux disease)   . COPD (chronic obstructive pulmonary disease)   . PNA (pneumonia)   . Anemia due to GI blood loss 07/11/2011  . 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 Stroke 2009    aphasia and memory impairment as residual  . Hypoxia   . Atrial fibrillation, permanent, rate controlled on coumadin 05/10/2011  . Hypogammaglobulinemia 06/01/2012    Immunoglobulins 05/01/2012-normal IgG M., normal IgA,  low IgG 344([phone]).   . 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 CVA history 05/10/2011  . CHF (congestive heart failure) 85/88/5027    diastolic  . History of nuclear stress test 2009    normal dobutamine myoview    Medications:  Prescriptions prior to admission  Medication Sig Dispense Refill  . diltiazem (DILACOR XR) 180 MG 24 hr capsule Take 180 mg by mouth daily before breakfast.        . ferrous sulfate 325 (65 FE) MG tablet Take 325 mg by mouth 2 (two) times daily with a meal.       . furosemide (LASIX) 80 MG tablet Take 80 mg by mouth daily.       Marland Kitchen loratadine (ALLERGY) 10 MG tablet Take 10 mg by mouth daily.      . Multiple Vitamins-Minerals (MULTIVITAMINS THER. W/MINERALS) TABS Take 1 tablet by mouth every evening.       . pantoprazole (PROTONIX) 40 MG tablet Take 40 mg by mouth daily.      . potassium chloride (K-DUR) 10 MEQ tablet Take 1 tablet (10 mEq total) by mouth daily.  30 tablet  0  . predniSONE (DELTASONE) 5 MG tablet Take 10 mg by mouth every other day.       . simvastatin (ZOCOR) 10 MG tablet Take 10 mg by mouth every morning.       . warfarin (COUMADIN) 4 MG tablet Take 6 mg by mouth daily.       Scheduled:  . diltiazem  180 mg Oral QAC breakfast  . ferrous sulfate  325 mg Oral BID WC  . furosemide  80 mg Oral Daily  .  loratadine  10 mg Oral Daily  . multivitamin with minerals  1 tablet Oral Daily  . pantoprazole  40 mg Oral Daily  . potassium chloride  10 mEq Oral Daily  . [START ON 04/17/2014] predniSONE  10 mg Oral QODAY  . simvastatin  10 mg Oral q morning - 10a  . sodium chloride  3 mL Intravenous Q12H  . Warfarin - Pharmacist Dosing Inpatient   Does not apply q1800    Assessment: 80yo female presents with new onset numbness. Pharmacy is consulted to dose warfarin for atrial fibrillation. Pt takes warfarin 6mg  PO daily at home. Pt has a history of GIB and wants to keep tighter control of INR to 2-2.5. INR today is still slightly elevated but has decreased today. Pt last took warfarin 04/14/14.  Goal of Therapy:  INR 2-2.5 due to GI bleed Monitor platelets by anticoagulation protocol: Yes   Plan:  Hold warfarin dose tonight Continue to monitor H&H and platelets and s/sx of bleeding Daily INR  Thanks for allowing pharmacy to be a part of this patient's care.  Excell Seltzer, PharmD Clinical Pharmacist, [phone]  04/16/2014,12:25 PM

## 2014-04-16 NOTE — Progress Notes (Signed)
  Echocardiogram 2D Echocardiogram has been performed.  Angelica [name] 04/16/2014, 3:45 PM

## 2014-04-16 NOTE — Evaluation (Signed)
Physical Therapy Evaluation Patient Details Name: Angelica [name] MRN: [id number] DOB: [birth year] Today's Date: 04/16/2014   History of Present Illness  Angelica [name] is a 80 y.o. female with history of previous CVA with resultant aphasia, atrial fibrillation on Coumadin, history of AV malformation and GI bleed, CHF and COPD was brought to the ER after patient was found to have numbness of the right lower extremity with pain. Pt recently admitted in 7/15 with LLE cellulitis.  Clinical Impression  Pt admitted with right sided hemiplegia. Pt currently with functional limitations due to the deficits listed below (see PT Problem List). Pt transferred bed to chair with mod A, right sided deficits.  Pt will benefit from skilled PT to increase their independence and safety with mobility to allow discharge to the venue listed below. PT will continue to follow.       Follow Up Recommendations CIR    Equipment Recommendations  None recommended by PT    Recommendations for Other Services OT consult     Precautions / Restrictions Precautions Precautions: Fall Precaution Comments: h/o falls Restrictions Weight Bearing Restrictions: No      Mobility  Bed Mobility Overal bed mobility: Needs Assistance Bed Mobility: Supine to Sit     Supine to sit: Mod assist     General bed mobility comments: pt soaked of urine upon my arrival, cannot tell me if she normally wears undergarments, she says she can't remember. Pt able to roll to left, min A to elevate trunk from bed. Min A to scoot to EOB  Transfers Overall transfer level: Needs assistance Equipment used: None Transfers: Sit to/from [facility] Sit to Stand: Mod assist Stand pivot transfers: Mod assist       General transfer comment: pt hesitant to try to stand, encouragement given, mod A to wt shift fwd and power up. Bilateral knees blocked though did not buckle. Pt able to take small pivot steps with difficulty  advanceing LLE. Incontinent of urine during transfer as well.  Ambulation/Gait             General Gait Details: unable to safely ambulate today. Will need +2 assist for safety as well as a way to manage incontinence to prevent wet floor and slippage.  Stairs            Wheelchair Mobility    Modified Rankin (Stroke Patients Only) Modified Rankin (Stroke Patients Only) Pre-Morbid Rankin Score: Moderate disability Modified Rankin: Moderately severe disability     Balance Overall balance assessment: Needs assistance Sitting-balance support: Single extremity supported;Feet supported Sitting balance-Leahy Scale: Fair   Postural control: Right lateral lean Standing balance support: Bilateral upper extremity supported;During functional activity Standing balance-Leahy Scale: Poor Standing balance comment: pt very fatigued with sitting EOB and standing, right lean                             Pertinent Vitals/Pain Pain Assessment: Faces Faces Pain Scale: Hurts little more Pain Location: RLE with mvmt but pt denied it hurting with transfer (though with aphasia, question this response) Pain Intervention(s): Monitored during session    Home Living Family/patient expects to be discharged to:: Private residence Living Arrangements: Spouse/significant other Available Help at Discharge: Family Type of Home: House Home Access: Stairs to enter Entrance Stairs-Rails: Right Entrance Stairs-Number of Steps: 3 Home Layout: Able to live on main level with bedroom/bathroom Home Equipment: Walker - 4 wheels Additional Comments: pt aphasic, above info gained  from chart from previous admission. Pt can give some info but occasionally just nods and says, "no"    Prior Function Level of Independence: Needs assistance   Gait / Transfers Assistance Needed: daughter reports recent falls  ADL's / Homemaking Assistance Needed: husband assists though he needed w/c to visit pt.  Daughter assists and caregivers come in if daughter gone but does not seem that she has 24 hr assist        Hand Dominance   Dominant Hand: Left    Extremity/Trunk Assessment   Upper Extremity Assessment: Defer to OT evaluation           Lower Extremity Assessment: RLE deficits/detail RLE Deficits / Details: df 2/5, knee ext 3/5, hip flex 2/5, right side significantly weaker than left    Cervical / Trunk Assessment: Kyphotic  Communication   Communication: Expressive difficulties  Cognition Arousal/Alertness: Awake/alert Behavior During Therapy: WFL for tasks assessed/performed Overall Cognitive Status: History of cognitive impairments - at baseline (per family)                      General Comments      Exercises General Exercises - Lower Extremity Ankle Circles/Pumps: AROM;Both;10 reps;Seated Straight Leg Raises: AROM;Both;5 reps;Seated      Assessment/Plan    PT Assessment Patient needs continued PT services  PT Diagnosis Difficulty walking;Abnormality of gait;Hemiplegia non-dominant side;Acute pain   PT Problem List Decreased strength;Decreased range of motion;Decreased activity tolerance;Decreased balance;Decreased mobility;Decreased coordination;Decreased cognition;Decreased knowledge of use of DME;Decreased safety awareness;Decreased knowledge of precautions;Pain  PT Treatment Interventions DME instruction;Gait training;Functional mobility training;Therapeutic activities;Therapeutic exercise;Balance training;Neuromuscular re-education;Cognitive remediation;Patient/family education   PT Goals (Current goals can be found in the Care Plan section) Acute Rehab PT Goals Patient Stated Goal: none stated PT Goal Formulation: With patient Time For Goal Achievement: 04/30/14 Potential to Achieve Goals: Fair    Frequency Min 3X/week   Barriers to discharge Decreased caregiver support elderly husband    Co-evaluation               End of Session  Equipment Utilized During Treatment: Gait belt Activity Tolerance: Patient limited by fatigue Patient left: in chair;with call bell/phone within reach;with nursing/sitter in room Nurse Communication: Mobility status    Functional Assessment Tool Used: clinical judgement Functional Limitation: Mobility: Walking and moving around Mobility: Walking and Moving Around Current Status [phone]): At least 20 percent but less than 40 percent impaired, limited or restricted Mobility: Walking and Moving Around Goal Status [phone]): At least 1 percent but less than 20 percent impaired, limited or restricted    Time: 1158-1229 PT Time Calculation (min): 31 min   Charges:   PT Evaluation $Initial PT Evaluation Tier I: 1 Procedure PT Treatments $Therapeutic Activity: 23-37 mins   PT G Codes:   Functional Assessment Tool Used: clinical judgement Functional Limitation: Mobility: Walking and moving around   [facility], [place]  Leighton Roach 04/16/2014, 2:10 PM

## 2014-04-16 NOTE — Progress Notes (Signed)
STROKE TEAM PROGRESS NOTE   HISTORY Angelica [name] is an 80 y.o. female with a history of atrial fibrillation, hypertension, COPD, previous left MCA stroke and dementia, presenting with new onset numbness involving right lower extremity. Patient was last known well at 10 AM today 04/15/2014 . She's been on Coumadin for anticoagulation. INR today was 3.31. CT scan of her head showed old large left MCA infarction. No acute changes were noted. NIH stroke score was 3. Patient was not administered TPA secondary to Beyond time under for treatment consideration; INR 3.31. She was admitted for further evaluation and treatment.   SUBJECTIVE (INTERVAL HISTORY) Her husband and daughter are at the bedside.  Overall she feels her condition is stable, though she still cannot walk. Walks without a walker at home, uses arms and legs without difficulty. Is independent with the walker. Yesterday am she was at baseline. By 5p had pain in her legs and could not walk. She sprained her L wrist 1 week ago, which is improving. Daughter unsure if she had new arm symptoms yesterday. She has resultant aphasia from old stroke.  Daughter said she has afib on coumadin INR goal is 1.8-2.2 because if higher than that she will have GI bleeding due to AVM in the gut. Today INR 2.95 which is pretty high for her.   OBJECTIVE Temp:  [97.3 F (36.3 C)-98.7 F (37.1 C)] 98.6 F (37 C) (10/14 2129) Pulse Rate:  [67-96] 96 (10/14 2129) Cardiac Rhythm:  [-] Atrial fibrillation (10/14 0800) Resp:  [16-18] 18 (10/14 2129) BP: (98-134)/(36-77) 122/68 mmHg (10/14 2129) SpO2:  [91 %-99 %] 99 % (10/14 2129)  No results found for this basename: GLUCAP,  in the last 168 hours  Recent Labs Lab 04/15/14 1755 04/15/14 1807 04/15/14 1819 04/16/14 0610  NA 141 139  --  143  K 3.6* 3.2*  --  3.4*  CL 103 100  --  105  CO2 27  --   --  28  GLUCOSE 105* 107*  --  87  BUN 13 12  --  10  CREATININE 0.95 1.00 1.10 0.84  CALCIUM 9.3  --    --  8.7  MG  --   --   --  1.9    Recent Labs Lab 04/15/14 1755 04/16/14 0610  AST 21 15  ALT 12 10  ALKPHOS 130* 119*  BILITOT 0.2* 0.2*  PROT 5.4* 5.1*  ALBUMIN 2.6* 2.2*    Recent Labs Lab 04/15/14 1755 04/15/14 1807 04/16/14 0610  WBC 6.3  --  8.2  NEUTROABS 3.9  --  6.2  HGB 10.8* 12.6 10.3*  HCT 35.2* 37.0 33.1*  MCV 85.2  --  84.9  PLT 376  --  388    Recent Labs Lab 04/15/14 2333  TROPONINI <0.30    Recent Labs  04/15/14 1755 04/16/14 0610  LABPROT 33.6* 30.4*  INR 3.31* 2.91*    Recent Labs  04/16/14 0218  COLORURINE YELLOW  LABSPEC 1.017  PHURINE 6.0  GLUCOSEU NEGATIVE  HGBUR SMALL*  BILIRUBINUR NEGATIVE  KETONESUR NEGATIVE  PROTEINUR NEGATIVE  UROBILINOGEN 0.2  NITRITE NEGATIVE  LEUKOCYTESUR MODERATE*       Component Value Date/Time   CHOL 125 04/16/2014 0610   TRIG 83 04/16/2014 0610   HDL 52 04/16/2014 0610   CHOLHDL 2.4 04/16/2014 0610   VLDL 17 04/16/2014 0610   LDLCALC 56 04/16/2014 0610   Lab Results  Component Value Date   HGBA1C 5.0 04/16/2014  Component Value Date/Time   LABOPIA NONE DETECTED 04/16/2014 0218   COCAINSCRNUR NONE DETECTED 04/16/2014 0218   LABBENZ NONE DETECTED 04/16/2014 0218   AMPHETMU NONE DETECTED 04/16/2014 0218   THCU NONE DETECTED 04/16/2014 0218   LABBARB NONE DETECTED 04/16/2014 0218     Recent Labs Lab 04/15/14 1755  ETH <11    Ct Head Wo Contrast 04/15/2014    No intracranial hemorrhage.  Remote large left hemispheric infarct with encephalomalacia left frontal and parietal lobe.  No CT evidence of large acute infarct.  Small vessel disease type changes.  Global atrophy without hydrocephalus.  Moderate mucosal thickening maxillary sinuses with mucosal thickening/ partial opacification ethmoid sinus air cells.    Dg Pelvis Portable 04/15/2014    Postoperative change right proximal femur. Mild symmetric narrowing both hip joints. No acute fracture or dislocation.   Dg Chest  Port 1 View 04/15/2014   No active disease.      Mri and Mra Head/brain Wo Cm   04/16/2014   IMPRESSION: 1. Small, subacute right cerebral convexity subdural hematoma. No significant mass effect. 2. Unchanged mass along the planum sphenoidale, consistent with a meningioma. 3. Remote left MCA territory infarcts. 4. Mild-to-moderate chronic small vessel ischemic disease. 5. No proximal intracranial occlusion or stenosis. Chronic occlusion of smaller left MCA branch vessels related to prior infarcts.   2D echo - Left ventricle: The cavity size was normal. Systolic function was normal. The estimated ejection fraction was in the range of 55% to 60%. Wall motion was normal; there were no regional wall motion abnormalities. - Aortic valve: Moderate thickening and calcification, consistent with sclerosis. - Mitral valve: There was mild regurgitation. - Left atrium: The atrium was mildly dilated. - Right ventricle: The cavity size was mildly dilated. Wall thickness was normal. Systolic function was mildly reduced. - Atrial septum: There was increased thickness of the septum, consistent with lipomatous hypertrophy. - Pulmonary arteries: PA peak pressure: 39 mm Hg (S).  Impressions:  - The right ventricular systolic pressure was increased consistent with mild pulmonary hypertension.   CUS - Bilateral: 1-39% ICA stenosis. Vertebral artery flow is antegrade.   PHYSICAL EXAM  Temp:  [97.3 F (36.3 C)-98.7 F (37.1 C)] 98.6 F (37 C) (10/14 2129) Pulse Rate:  [67-96] 96 (10/14 2129) Resp:  [16-18] 18 (10/14 2129) BP: (98-134)/(36-77) 122/68 mmHg (10/14 2129) SpO2:  [91 %-99 %] 99 % (10/14 2129)  General - Well nourished, well developed, in no apparent distress.  Ophthalmologic - not able to see through.  Cardiovascular - irregularly irregular heart rate and rhythm.  Mental Status -  Awake alert, orientated to place and people, but not to time or president.  Significant  perseveration. Partial global aphasia, deficit on naming but preserved on repetition.   Cranial Nerves II - XII - II - blink to visual threat bilaterally. III, IV, VI - Extraocular movements intact. V - Facial sensation intact bilaterally. VII - Facial movement intact bilaterally. VIII - Hearing & vestibular intact bilaterally. X - Palate elevates symmetrically. XI - Chin turning & shoulder shrug intact bilaterally. XII - Tongue protrusion intact.  Motor Strength - The patient's strength was 3+/5 in all extremities and pronator drift was absent.  Bulk was normal and fasciculations were absent.   Motor Tone - Muscle tone was assessed at the neck and appendages and was normal.  Reflexes - The patient's reflexes were 1+ in all extremities and she had no pathological reflexes.  Sensory - Light touch, temperature/pinprick were assessed  and were normal symmetrical.    Coordination - The patient had normal movements in the hands with no ataxia or dysmetria.  Tremor was absent.  Gait and Station - not tested.  ASSESSMENT/PLAN Ms. Angelica [name] is a 80 y.o. female with history of atrial fibrillation, hypertension, COPD, previous left MCA stroke and dementia, presenting with new onset numbness involving right lower extremity. She did not receive IV t-PA  due to delay in arrival and INR 3.31.   Right frontal high convexity small SDH, subacute vs. Chronic in the setting of supratherapeutic INR.   INR this am 2.95, due to concern of SDH, will hold off coumadin and give VitK 10mg  iv. No need of aggressive reversal with Kcentra or FFP as pt neuro stable and not life-threatening bleeding now. Continue monitor INR in am.   MRI small SDH at right high convexity subacute vs. Chronic as well as left MCA old infarct   MRA left MCA branches occlusion   Carotid Doppler  unremarkable   2D Echo  unremarkable  HgbA1c 5.0  SCDs for VTE prophylaxis General thin liquids OOB with assistance  Risk  factor education  Ongoing aggressive risk factor management  Resultant inability to walk.  Therapy recommendations:  pending   Disposition:  pending   Atrial Fibrillation  Home meds:  Coumadin  INR 3.31 on admission. OP goal 2.0 (1.8-2.2).   Due to SDH, will continue to hold off coumadin  VitK 10mg  iv given.  Has had GIB with INR 2.8. She routinely has positive guiac d/t AVM requiring transfusions   Hypertension  Home meds:   Diltiazem, lasix  Due to SDH, will keep SBP < 140  Stable currently  Hyperlipidemia  Home meds:  zocor 10  resumed in hospital  LDL 56  Continue statin at discharge  Other Stroke Risk Factors Advanced age   Hx L MCA stroke 2009 with residual aphasia and memory impairment, no extremity weakness   Family hx stroke (mother)  Other Active Problems  COPD, on prednisone  Baseline dementia  Hx CHF  Hospital day # 1  SHARON BIBY, MSN, RN, ANVP-BC, ANP-BC, [facility] Pager: [phone] 04/16/2014 10:37 PM   I, the attending vascular neurologist, have personally obtained a history, examined the patient, evaluated laboratory data, individually viewed imaging studies, and formulated the assessment and plan of care.  I have made any additions or clarifications directly to the above note and agree with the findings and plan as currently documented.   Rosalin Hawking, MD PhD Stroke Neurology 04/16/2014 10:40 PM  To contact Stroke Continuity provider, please refer to [email or web address] After hours, contact General Neurology

## 2014-04-16 NOTE — Evaluation (Signed)
Occupational Therapy Evaluation Patient Details Name: Angelica [name] MRN: [id number] DOB: 10/20/1922 Today's Date: 04/16/2014    History of Present Illness Angelica [name] is a 80 y.o. female with history of previous CVA with resultant aphasia, atrial fibrillation on Coumadin, history of AV malformation and GI bleed, CHF and COPD was brought to the ER after patient was found to have numbness of the right lower extremity with pain. Pt recently admitted in 7/15 with LLE cellulitis.   Clinical Impression   Patient evaluated by Occupational Therapy with no further acute OT needs identified. Pt with cognitive deficits at baseline however does demonstrate decr mobility / transfers from baseline. OT to defer education and OT needs to SNF level of care. See below for any follow-up Occupational Therapy or equipment needs. OT to sign off. Thank you for referral.   Pt currently workup for r/o CVA. Elderly husband requires w/c and would be unable to manage MOD (A) level transfer.     Follow Up Recommendations  SNF;Supervision/Assistance - 24 hour    Equipment Recommendations  None recommended by OT    Recommendations for Other Services       Precautions / Restrictions Precautions Precautions: Fall Precaution Comments: h/o falls Restrictions Weight Bearing Restrictions: No      Mobility Bed Mobility Overal bed mobility: Needs Assistance Bed Mobility: Sit to Supine      Sit to supine: Min assist   General bed mobility comments: pt required incr time and effort but able to place bil LE into the bed  Transfers Overall transfer level: Needs assistance Equipment used: None Transfers: Sit to/from [facility] Sit to Stand: Mod assist Stand pivot transfers: Mod assist       General transfer comment: pt with posterior lean, posterior pelvic tilt to need hand over hand to release BSC handle    Balance Overall balance assessment: Needs assistance Sitting-balance  support: Bilateral upper extremity supported;Feet supported Sitting balance-Leahy Scale: Poor   Postural control: Posterior lean Standing balance support: Bilateral upper extremity supported;During functional activity Standing balance-Leahy Scale: Poor Standing balance comment: pt very fatigued with sitting EOB and standing, right lean                            ADL Overall ADL's : Needs assistance/impaired     Grooming: Wash/dry hands;Minimal assistance;Sitting           Upper Body Dressing : Moderate assistance;Sitting       Toilet Transfer: Moderate assistance;Stand-pivot;BSC Toilet Transfer Details (indicate cue type and reason): holding onto the recliner arm rest and needed hand over hand to release chair arm rest. Pt needed facilitation for anterior weight shift for static standing. Toileting- Clothing Manipulation and Hygiene: Total assistance;Sit to/from stand       Functional mobility during ADLs: Moderate assistance General ADL Comments: Pt incontinent in chair and unaware. Pt making small "chit chat" discussion. pt reports "I live with my mother and daughter" Pt poor historian. Pt transfered to [facility] and voiding again. RN Joni notifed of urine sample. OT playing music "country garth brooks" pt stated she liked country. OT positioned in front of patient and used dancing sway and step motion to help advance pt to bed ~5 ft away. pt with incr ambulation with musci and following therapist lead. Pt from home using rollator so current transfer and mobility is not at baseline     Vision  Perception     Praxis      Pertinent Vitals/Pain Pain Assessment: No/denies pain Faces Pain Scale: Hurts little more Pain Location: RLE with mvmt but pt denied it hurting with transfer (though with aphasia, question this response) Pain Intervention(s): Monitored during session     Hand Dominance Left   Extremity/Trunk Assessment Upper Extremity  Assessment Upper Extremity Assessment: Overall WFL for tasks assessed   Lower Extremity Assessment Lower Extremity Assessment: Defer to PT evaluation RLE Deficits / Details: df 2/5, knee ext 3/5, hip flex 2/5, right side significantly weaker than left RLE Coordination: decreased gross motor   Cervical / Trunk Assessment Cervical / Trunk Assessment: Kyphotic   Communication Communication Communication: Expressive difficulties   Cognition Arousal/Alertness: Awake/alert Behavior During Therapy: WFL for tasks assessed/performed Overall Cognitive Status: History of cognitive impairments - at baseline                     General Comments       Exercises Exercises: General Lower Extremity     Shoulder Instructions      Home Living Family/patient expects to be discharged to:: Private residence Living Arrangements: Spouse/significant other Available Help at Discharge: Family Type of Home: House Home Access: Stairs to enter Technical brewer of Steps: 3 Entrance Stairs-Rails: Right Home Layout: Able to live on main level with bedroom/bathroom               Home Equipment: Walker - 4 wheels   Additional Comments: pt aphasic from previous cva. NO family present so all information obtained from previous admission  Lives With: Spouse    Prior Functioning/Environment Level of Independence: Needs assistance  Gait / Transfers Assistance Needed: daughter reports recent falls ADL's / Homemaking Assistance Needed: husband assists though he needed w/c to visit pt. Daughter assists and caregivers come in if daughter gone but does not seem that she has 24 hr assist Communication / Swallowing Assistance Needed: aphasia Comments: pt states she has a rollator at home    OT Diagnosis: Generalized weakness;Cognitive deficits   OT Problem List: Decreased strength;Decreased activity tolerance;Impaired balance (sitting and/or standing);Decreased cognition;Decreased safety  awareness;Decreased knowledge of use of DME or AE;Decreased knowledge of precautions   OT Treatment/Interventions:      OT Goals(Current goals can be found in the care plan section) Acute Rehab OT Goals Patient Stated Goal: none stated OT Goal Formulation: Patient unable to participate in goal setting  OT Frequency:     Barriers to D/C:            Co-evaluation              End of Session Equipment Utilized During Treatment: Gait belt Nurse Communication: Mobility status;Precautions  Activity Tolerance: Patient tolerated treatment well Patient left: in bed;with call bell/phone within reach;with bed alarm set (RA 93%)   Time: 2353-6144 OT Time Calculation (min): 19 min Charges:  OT General Charges $OT Visit: 1 Procedure OT Evaluation $Initial OT Evaluation Tier I: 1 Procedure OT Treatments $Self Care/Home Management : 8-22 mins G-Codes: OT G-codes **NOT FOR INPATIENT CLASS** Functional Assessment Tool Used: clinical judgement Functional Limitation: Self care Self Care Current Status (R1540): At least 60 percent but less than 80 percent impaired, limited or restricted Self Care Goal Status (G8676): At least 60 percent but less than 80 percent impaired, limited or restricted Self Care Discharge Status [phone]): At least 60 percent but less than 80 percent impaired, limited or restricted  Parke Poisson B 04/16/2014, 2:59  PM Pager: [phone]

## 2014-04-17 DIAGNOSIS — I10 Essential (primary) hypertension: Secondary | ICD-10-CM | POA: Diagnosis present

## 2014-04-17 DIAGNOSIS — I6203 Nontraumatic chronic subdural hemorrhage: Secondary | ICD-10-CM | POA: Diagnosis present

## 2014-04-17 DIAGNOSIS — Q2733 Arteriovenous malformation of digestive system vessel: Secondary | ICD-10-CM | POA: Diagnosis not present

## 2014-04-17 DIAGNOSIS — I272 Other secondary pulmonary hypertension: Secondary | ICD-10-CM | POA: Diagnosis present

## 2014-04-17 DIAGNOSIS — Z800 Family history of malignant neoplasm of digestive organs: Secondary | ICD-10-CM | POA: Diagnosis not present

## 2014-04-17 DIAGNOSIS — R262 Difficulty in walking, not elsewhere classified: Secondary | ICD-10-CM | POA: Diagnosis present

## 2014-04-17 DIAGNOSIS — I69920 Aphasia following unspecified cerebrovascular disease: Secondary | ICD-10-CM | POA: Diagnosis not present

## 2014-04-17 DIAGNOSIS — R0902 Hypoxemia: Secondary | ICD-10-CM | POA: Diagnosis present

## 2014-04-17 DIAGNOSIS — Z823 Family history of stroke: Secondary | ICD-10-CM | POA: Diagnosis not present

## 2014-04-17 DIAGNOSIS — I6200 Nontraumatic subdural hemorrhage, unspecified: Secondary | ICD-10-CM

## 2014-04-17 DIAGNOSIS — Z806 Family history of leukemia: Secondary | ICD-10-CM | POA: Diagnosis not present

## 2014-04-17 DIAGNOSIS — R200 Anesthesia of skin: Secondary | ICD-10-CM | POA: Diagnosis present

## 2014-04-17 DIAGNOSIS — E785 Hyperlipidemia, unspecified: Secondary | ICD-10-CM | POA: Diagnosis present

## 2014-04-17 DIAGNOSIS — D500 Iron deficiency anemia secondary to blood loss (chronic): Secondary | ICD-10-CM

## 2014-04-17 DIAGNOSIS — G459 Transient cerebral ischemic attack, unspecified: Secondary | ICD-10-CM | POA: Diagnosis present

## 2014-04-17 DIAGNOSIS — R1311 Dysphagia, oral phase: Secondary | ICD-10-CM | POA: Diagnosis present

## 2014-04-17 DIAGNOSIS — R296 Repeated falls: Secondary | ICD-10-CM | POA: Diagnosis present

## 2014-04-17 DIAGNOSIS — E876 Hypokalemia: Secondary | ICD-10-CM | POA: Diagnosis present

## 2014-04-17 DIAGNOSIS — J449 Chronic obstructive pulmonary disease, unspecified: Secondary | ICD-10-CM | POA: Diagnosis present

## 2014-04-17 DIAGNOSIS — Z8249 Family history of ischemic heart disease and other diseases of the circulatory system: Secondary | ICD-10-CM | POA: Diagnosis not present

## 2014-04-17 DIAGNOSIS — Z6824 Body mass index (BMI) 24.0-24.9, adult: Secondary | ICD-10-CM | POA: Diagnosis not present

## 2014-04-17 DIAGNOSIS — F0390 Unspecified dementia without behavioral disturbance: Secondary | ICD-10-CM | POA: Diagnosis present

## 2014-04-17 DIAGNOSIS — I482 Chronic atrial fibrillation: Secondary | ICD-10-CM | POA: Diagnosis present

## 2014-04-17 DIAGNOSIS — I509 Heart failure, unspecified: Secondary | ICD-10-CM | POA: Diagnosis present

## 2014-04-17 DIAGNOSIS — Z7901 Long term (current) use of anticoagulants: Secondary | ICD-10-CM | POA: Diagnosis not present

## 2014-04-17 DIAGNOSIS — R1313 Dysphagia, pharyngeal phase: Secondary | ICD-10-CM | POA: Diagnosis present

## 2014-04-17 LAB — BASIC METABOLIC PANEL
Anion gap: 15 (ref 5–15)
BUN: 10 mg/dL (ref 6–23)
CHLORIDE: 105 meq/L (ref 96–112)
CO2: 25 meq/L (ref 19–32)
Calcium: 9 mg/dL (ref 8.4–10.5)
Creatinine, Ser: 0.94 mg/dL (ref 0.50–1.10)
GFR calc Af Amer: 61 mL/min — ABNORMAL LOW (ref >90)
GFR calc non Af Amer: 52 mL/min — ABNORMAL LOW (ref >90)
Glucose, Bld: 96 mg/dL (ref 70–99)
POTASSIUM: 3.9 meq/L (ref 3.7–5.3)
Sodium: 145 mEq/L (ref 137–147)

## 2014-04-17 LAB — PROTIME-INR
INR: 1.74 — ABNORMAL HIGH (ref 0.00–1.49)
PROTHROMBIN TIME: 20.5 s — AB (ref 11.6–15.2)

## 2014-04-17 LAB — CBC
HCT: 38.5 % (ref 36.0–46.0)
Hemoglobin: 11.5 g/dL — ABNORMAL LOW (ref 12.0–15.0)
MCH: 26.1 pg (ref 26.0–34.0)
MCHC: 29.9 g/dL — ABNORMAL LOW (ref 30.0–36.0)
MCV: 87.5 fL (ref 78.0–100.0)
Platelets: 431 10*3/uL — ABNORMAL HIGH (ref 150–400)
RBC: 4.4 MIL/uL (ref 3.87–5.11)
RDW: 16.5 % — ABNORMAL HIGH (ref 11.5–15.5)
WBC: 8.2 10*3/uL (ref 4.0–10.5)

## 2014-04-17 MED ORDER — WARFARIN SODIUM 2 MG PO TABS
2.0000 mg | ORAL_TABLET | Freq: Once | ORAL | Status: AC
  Administered 2014-04-17: 2 mg via ORAL
  Filled 2014-04-17: qty 1

## 2014-04-17 MED ORDER — WARFARIN - PHARMACIST DOSING INPATIENT: Freq: Every day | Status: DC

## 2014-04-17 NOTE — Progress Notes (Signed)
STROKE TEAM PROGRESS NOTE   HISTORY [name] is an 80 y.o. female with a history of atrial fibrillation, hypertension, COPD, previous left MCA stroke and dementia, presenting with new onset numbness involving right lower extremity. Patient was last known well at 10 AM today 04/15/2014 . She's been on Coumadin for anticoagulation. INR today was 3.31. CT scan of her head showed old large left MCA infarction. No acute changes were noted. NIH stroke score was 3. Patient was not administered TPA secondary to Beyond time under for treatment consideration; INR 3.31. She was admitted for further evaluation and treatment.   SUBJECTIVE (INTERVAL HISTORY) No family at bedside today. Patient up in chair states she feels well. Overnight MRI showed subacute SDH and she got VitK 10mg  reverse and INR this am 1.74. Talked with Dr. Nevada Crane in radiology, pt did have small SDH which was not seen in CT head. But whether chronic or subacute SDH is not very clear as MRI is not good in timing of bleeding, especially SDH. Will keep INR 1.8-2.2 since pt clinically doing well and SDH could be chronic.   OBJECTIVE Temp:  [97.5 F (36.4 C)-98.7 F (37.1 C)] 98.6 F (37 C) (10/15 0904) Pulse Rate:  [68-96] 85 (10/15 0904) Cardiac Rhythm:  [-] Atrial fibrillation (10/14 2000) Resp:  [16-18] 18 (10/15 0904) BP: (117-135)/(46-77) 126/62 mmHg (10/15 0904) SpO2:  [90 %-99 %] 90 % (10/15 0904)  No results found for this basename: GLUCAP,  in the last 168 hours  Recent Labs Lab 04/15/14 1755 04/15/14 1807 04/15/14 1819 04/16/14 0610 04/17/14 0544  NA 141 139  --  143 145  K 3.6* 3.2*  --  3.4* 3.9  CL 103 100  --  105 105  CO2 27  --   --  28 25  GLUCOSE 105* 107*  --  87 96  BUN 13 12  --  10 10  CREATININE 0.95 1.00 1.10 0.84 0.94  CALCIUM 9.3  --   --  8.7 9.0  MG  --   --   --  1.9  --     Recent Labs Lab 04/15/14 1755 04/16/14 0610  AST 21 15  ALT 12 10  ALKPHOS 130* 119*  BILITOT 0.2* 0.2*   PROT 5.4* 5.1*  ALBUMIN 2.6* 2.2*    Recent Labs Lab 04/15/14 1755 04/15/14 1807 04/16/14 0610 04/17/14 0544  WBC 6.3  --  8.2 8.2  NEUTROABS 3.9  --  6.2  --   HGB 10.8* 12.6 10.3* 11.5*  HCT 35.2* 37.0 33.1* 38.5  MCV 85.2  --  84.9 87.5  PLT 376  --  388 431*    Recent Labs Lab 04/15/14 2333  TROPONINI <0.30    Recent Labs  04/15/14 1755 04/16/14 0610 04/17/14 0544  LABPROT 33.6* 30.4* 20.5*  INR 3.31* 2.91* 1.74*    Recent Labs  04/16/14 0218  COLORURINE YELLOW  LABSPEC 1.017  PHURINE 6.0  GLUCOSEU NEGATIVE  HGBUR SMALL*  BILIRUBINUR NEGATIVE  KETONESUR NEGATIVE  PROTEINUR NEGATIVE  UROBILINOGEN 0.2  NITRITE NEGATIVE  LEUKOCYTESUR MODERATE*       Component Value Date/Time   CHOL 125 04/16/2014 0610   TRIG 83 04/16/2014 0610   HDL 52 04/16/2014 0610   CHOLHDL 2.4 04/16/2014 0610   VLDL 17 04/16/2014 0610   LDLCALC 56 04/16/2014 0610   Lab Results  Component Value Date   HGBA1C 5.0 04/16/2014      Component Value Date/Time   LABOPIA NONE DETECTED  04/16/2014 0218   COCAINSCRNUR NONE DETECTED 04/16/2014 0218   LABBENZ NONE DETECTED 04/16/2014 0218   AMPHETMU NONE DETECTED 04/16/2014 0218   THCU NONE DETECTED 04/16/2014 0218   LABBARB NONE DETECTED 04/16/2014 0218     Recent Labs Lab 04/15/14 1755  ETH <11    Ct Head Wo Contrast 04/15/2014    No intracranial hemorrhage.  Remote large left hemispheric infarct with encephalomalacia left frontal and parietal lobe.  No CT evidence of large acute infarct.  Small vessel disease type changes.  Global atrophy without hydrocephalus.  Moderate mucosal thickening maxillary sinuses with mucosal thickening/ partial opacification ethmoid sinus air cells.    Dg Pelvis Portable 04/15/2014    Postoperative change right proximal femur. Mild symmetric narrowing both hip joints. No acute fracture or dislocation.   Dg Chest Port 1 View 04/15/2014   No active disease.      Mri and Mra Head/brain Wo Cm   04/16/2014   1. Small, subacute right cerebral convexity subdural hematoma. No significant mass effect. 2. Unchanged mass along the planum sphenoidale, consistent with a meningioma. 3. Remote left MCA territory infarcts. 4. Mild-to-moderate chronic small vessel ischemic disease. 5. No proximal intracranial occlusion or stenosis. Chronic occlusion of smaller left MCA branch vessels related to prior infarcts.   2D echo - Left ventricle: The cavity size was normal. Systolic function was normal. The estimated ejection fraction was in the range of 55% to 60%. Wall motion was normal; there were no regional wall motion abnormalities.  - Aortic valve: Moderate thickening and calcification, consistent with sclerosis. - Mitral valve: There was mild regurgitation. - Left atrium: The atrium was mildly dilated. - Right ventricle: The cavity size was mildly dilated. Wall thickness was normal. Systolic function was mildly reduced.  - Atrial septum: There was increased thickness of the septum consistent with lipomatous hypertrophy. - Pulmonary arteries: PA peak pressure: 39 mm Hg (S). Impressions: - The right ventricular systolic pressure was increased consistent with mild pulmonary hypertension.  CUS - Bilateral: 1-39% ICA stenosis. Vertebral artery flow is antegrade.   PHYSICAL EXAM Temp:  [97.5 F (36.4 C)-98.7 F (37.1 C)] 98.6 F (37 C) (10/15 0904) Pulse Rate:  [68-96] 85 (10/15 0904) Resp:  [16-18] 18 (10/15 0904) BP: (117-135)/(46-77) 126/62 mmHg (10/15 0904) SpO2:  [90 %-99 %] 90 % (10/15 0904)  General - Well nourished, well developed, in no apparent distress.  Ophthalmologic - not able to see through.  Cardiovascular - irregularly irregular heart rate and rhythm.  Mental Status -  Awake alert, orientated to place and people, but not to time or president.  Significant perseveration. Partial global aphasia, deficit on naming but preserved on repetition.   Cranial Nerves II - XII - II -  blink to visual threat bilaterally. III, IV, VI - Extraocular movements intact. V - Facial sensation intact bilaterally. VII - Facial movement intact bilaterally. VIII - Hearing & vestibular intact bilaterally. X - Palate elevates symmetrically. XI - Chin turning & shoulder shrug intact bilaterally. XII - Tongue protrusion intact.  Motor Strength - The patient's strength was 3+/5 in all extremities and pronator drift was absent.  Bulk was normal and fasciculations were absent.   Motor Tone - Muscle tone was assessed at the neck and appendages and was normal.  Reflexes - The patient's reflexes were 1+ in all extremities and she had no pathological reflexes.  Sensory - Light touch, temperature/pinprick were assessed and were normal symmetrical.    Coordination - The patient had  normal movements in the hands with no ataxia or dysmetria.  Tremor was absent.  Gait and Station - not tested.  ASSESSMENT/PLAN Ms. Angelica [name] is a 80 y.o. female with history of atrial fibrillation, hypertension, COPD, previous left MCA stroke and dementia, presenting with new onset numbness involving right lower extremity. She did not receive IV t-PA  due to delay in arrival and INR 3.31.   Discussed with Dr. Nevada Crane in radiology department, pt did have small SDH which was not seen in CT head. But whether chronic or subacute SDH is not very clear as MRI is not good in timing of bleeding, especially SDH. Will keep INR 1.8-2.2 since pt clinically doing well and SDH could be chronic.  Right frontal high convexity small SDH, subacute vs. chronic in the setting of supratherapeutic INR. No acute ischemic cerebral infarct.  Subdural vs. Chronic SDH not seen on initial CT. Was seen on subsequent MRI.   INR 2.95 yesterday, due to concern of SDH, coumadin held and given VitK 10mg  iv. No need of aggressive reversal with Kcentra or FFP as pt neuro stable and not life-threatening bleeding. INR down to 1.74 post Vit K.    MRI small SDH at right high convexity subacute vs. Chronic as well as left MCA old infarct   MRA left MCA branches occlusion   Carotid Doppler  unremarkable   2D Echo  unremarkable  HgbA1c 5.0  SCDs for VTE prophylaxis General thin liquids OOB with assistance  Risk factor education  Ongoing aggressive risk factor management  Therapy recommendations:  SNF  Disposition:  SNF  Atrial Fibrillation  Home meds:  Coumadin  INR 3.31 on admission. OP goal 2.0 (1.8-2.2).   Due to SDH, VitK 10mg  iv given.  Has had GIB with INR 2.8. She routinely has positive guiac d/t AVM requiring transfusions  INR 1.74 this am  Put in pharmacy consult again for coumadin with INR 1.8-2.2. Pt neuro stable and SDH could be chronic, so no need to hold off coumadin, OK to target at 1.8-2.2.   Hypertension  Home meds:   Diltiazem, lasix  Due to SDH, will keep SBP < 140  Stable currently  Hyperlipidemia  Home meds:  zocor 10  resumed in hospital  LDL 56  Continue statin at discharge  Other Stroke Risk Factors Advanced age   Hx L MCA stroke 2009 with residual aphasia and memory impairment, no extremity weakness   Family hx stroke (mother)  Other Active Problems  COPD, on prednisone  Baseline dementia  Hx CHF  Hospital day # 2  SHARON BIBY, MSN, RN, ANVP-BC, ANP-BC, [facility] Pager: [phone] 04/17/2014 10:04 AM   I, the attending vascular neurologist, have personally obtained a history, examined the patient, evaluated laboratory data, individually viewed imaging studies, and formulated the assessment and plan of care.  I have made any additions or clarifications directly to the above note and agree with the findings and plan as currently documented.   Neurology will sign off. Please call with questions. Pt will follow up with Dr. Erlinda Hong at [facility] in about 2 months. Thanks for the consult.  Rosalin Hawking, MD PhD Stroke Neurology 04/17/2014 9:36  PM   To contact Stroke Continuity provider, please refer to [email or web address] After hours, contact General Neurology

## 2014-04-17 NOTE — Progress Notes (Signed)
Clinical Social Worker spoke with patient's daughter, [name] and pt's husband in reference to insurance coverage for SNF placement and bed offers. Patient's daughter expressed frustration towards pt's observation status and insurance requirements for SNF placement. Patient's daughter reported being unable to pay privately for a long-term SNF placement and that patient would not be able to return home due to being unable to ambulate stairs. CSW provided pt's daughter with bed offers and web address to review SNF ratings. CSW offered pt's daughter option for Home Health, advised short-term private pay and possibly applying for medicaid in the near future. CSW plans to met with pt's daughter again tomorrow. CSW remains available for continued support and to facilitate patient's discharge needs when medically stable.   Glendon Axe, MSW, LCSWA [phone] 04/17/2014 5:40 PM

## 2014-04-17 NOTE — Progress Notes (Signed)
[facility] for warfarin Indication: atrial fibrillation  Allergies  Allergen Reactions  . Amoxicillin Hives  . Penicillins Hives    Patient Measurements: Height: 5\' 4"  (162.6 cm) Weight: 140 lb 9.6 oz (63.776 kg) IBW/kg (Calculated) : 54.7  Vital Signs: Temp: 98.1 F (36.7 C) (10/15 1802) Temp Source: Oral (10/15 1802) BP: 129/87 mmHg (10/15 1802) Pulse Rate: 68 (10/15 1802)  Labs:  Recent Labs  04/15/14 1755 04/15/14 1807 04/15/14 1819 04/15/14 2333 04/16/14 0610 04/17/14 0544  HGB 10.8* 12.6  --   --  10.3* 11.5*  HCT 35.2* 37.0  --   --  33.1* 38.5  PLT 376  --   --   --  388 431*  APTT 58*  --   --   --   --   --   LABPROT 33.6*  --   --   --  30.4* 20.5*  INR 3.31*  --   --   --  2.91* 1.74*  CREATININE 0.95 1.00 1.10  --  0.84 0.94  TROPONINI  --   --   --  <0.30  --   --     Estimated Creatinine Clearance: 35 ml/min (by C-G formula based on Cr of 0.94).  Assessment: 80yo female presents with new onset numbness. MRI showed subacute SDH. Admission INR was elevated at 3.31- home dose of warfarin 6mg  PO daily, last dose taken on 04/14/14. Received 10mg  vitamin K and INR this morning was 1.74. Neurology re-entered warfarin consult with new, lower INR goal as patient's neuro status stable and possibility SDH is chronic.  Goal of Therapy:  INR 1.8-2.2 Monitor platelets by anticoagulation protocol: Yes   Plan:  1. Warfarin 2mg  po x1 tonight 2. Daily PT/INR 3. Follow closely for s/s bleeding, neuro status changes  Andre Gallego D. Aristides Luckey, PharmD, BCPS Clinical Pharmacist Pager: [phone] 04/17/2014 9:50 PM

## 2014-04-17 NOTE — Progress Notes (Signed)
Supervised and reviewed by Raeqwon Lux MA CCC-SLP  

## 2014-04-17 NOTE — Progress Notes (Signed)
[facility] PROGRESS NOTE  Angelica [name] YIF:[id number] DOB: 03-25-1923 DOA: 04/15/2014 PCP: [name], MD  Assessment/Plan: 1. Subdural hematoma -MRI showing not showing no acute infarct -Radiology reporting a subacute subdural hematoma along the right cerebral convexity measuring up to 4 mm -Initial CT scan of brain, however, did not reveal evidence of acute intracranial hemorrhage.   -Question if this could be chronic bleed; Family reporting that she has had multiple falls at home. She has history of functional decline and unsteady gait, has caregiver support at home. Would likely benefit from higher level of care at SNF -Case was discussed with neurology, agree with the continuation of anticoagulation with target INR near 1.8. Labs showing INR of 1.7 -Will need SNF placement   2.  Atrial fibrillation -Patient is rate controlled, with ventricular rates in the 60s to 80s -Have been chronically anticoagulated with Coumadin presenting with INR of 3.31 -Pharmacy has been consulted for warfarin management -Continue diltiazem 180 mg by mouth daily -INR 1.7 after the administration of Vit K  3.  History of AVM/GI bleed -Presented with INR of 3.31, CBC showing hemoglobin 11.5 -Remains hemodynamically stable, no evidence of GI bleed currently  4. Dyslipidemia -Continue statin therapy  5.  Hypertension. -Blood pressure stable, continue diltiazem    Code Status: Full code Family Communication: Spoke with daughter over telephone conversation Disposition Plan: PT recommending SNF placement   Consultants:  Neurology   HPI/Subjective: Patient is a pleasant 80 year old female with a history of atrial fibrillation, hypertension, history of CVA, admitted to the medicine service on 04/15/2014, presenting with complaints of numbness involving right lower extremity. She was initially worked up with a CT scan of brain which did not show evidence of acute intracranial changes.  She was placed on a TIA protocol Patient assisted to bedside chair today. She required two person assist.   Objective: Filed Vitals:   04/17/14 1429  BP: 126/50  Pulse: 88  Temp: 98.6 F (37 C)  Resp: 18   No intake or output data in the 24 hours ending 04/17/14 1522 Filed Weights   04/15/14 2018  Weight: 63.776 kg (140 lb 9.6 oz)    Exam:   General:  Patient is in no acute distress, awake alert oriented to time and place  Cardiovascular: Regular rate and rhythm normal S1-S2  Respiratory: Normal respiratory effort, lungs clear to auscultation  Abdomen: Soft nontender not a  Musculoskeletal: No edema  Neurological: Showed 5 of 5 muscle strength to bilateral upper and lower extremities, reporting resolution to numbness involving right lower extremity  Data Reviewed: Basic Metabolic Panel:  Recent Labs Lab 04/15/14 1755 04/15/14 1807 04/15/14 1819 04/16/14 0610 04/17/14 0544  NA 141 139  --  143 145  K 3.6* 3.2*  --  3.4* 3.9  CL 103 100  --  105 105  CO2 27  --   --  28 25  GLUCOSE 105* 107*  --  87 96  BUN 13 12  --  10 10  CREATININE 0.95 1.00 1.10 0.84 0.94  CALCIUM 9.3  --   --  8.7 9.0  MG  --   --   --  1.9  --    Liver Function Tests:  Recent Labs Lab 04/15/14 1755 04/16/14 0610  AST 21 15  ALT 12 10  ALKPHOS 130* 119*  BILITOT 0.2* 0.2*  PROT 5.4* 5.1*  ALBUMIN 2.6* 2.2*   No results found for this basename: LIPASE, AMYLASE,  in the last 168 hours  No results found for this basename: AMMONIA,  in the last 168 hours CBC:  Recent Labs Lab 04/15/14 1755 04/15/14 1807 04/16/14 0610 04/17/14 0544  WBC 6.3  --  8.2 8.2  NEUTROABS 3.9  --  6.2  --   HGB 10.8* 12.6 10.3* 11.5*  HCT 35.2* 37.0 33.1* 38.5  MCV 85.2  --  84.9 87.5  PLT 376  --  388 431*   Cardiac Enzymes:  Recent Labs Lab 04/15/14 2333  TROPONINI <0.30   BNP (last 3 results)  Recent Labs  04/15/14 2333  PROBNP 1258.0*   CBG: No results found for this  basename: GLUCAP,  in the last 168 hours  No results found for this or any previous visit (from the past 240 hour(s)).   Studies: Ct Head Wo Contrast  04/15/2014   CLINICAL DATA:  80 year old hypertensive female with history of atrial fibrillation and prior stroke. Numbness right leg. Code stroke. Initial encounter.  EXAM: CT HEAD WITHOUT CONTRAST  TECHNIQUE: Contiguous axial images were obtained from the base of the skull through the vertex without intravenous contrast.  COMPARISON:  11/14/2012.  FINDINGS: No intracranial hemorrhage.  Remote large left hemispheric infarct with encephalomalacia left frontal and parietal lobe.  No CT evidence of large acute infarct.  Small vessel disease type changes.  Global atrophy without hydrocephalus.  Vascular calcifications.  No intracranial mass lesion noted on this unenhanced exam.  Moderate mucosal thickening maxillary sinuses with mucosal thickening/ partial opacification ethmoid sinus air cells.  IMPRESSION: No intracranial hemorrhage.  Remote large left hemispheric infarct with encephalomalacia left frontal and parietal lobe.  No CT evidence of large acute infarct.  Small vessel disease type changes.  Global atrophy without hydrocephalus.  Moderate mucosal thickening maxillary sinuses with mucosal thickening/ partial opacification ethmoid sinus air cells.  These results were called by telephone at the time of interpretation on 04/15/2014 at 6:15 pm to Dr. Nicole Kindred who verbally acknowledged these results.   Electronically Signed   By: Chauncey Cruel M.D.   On: 04/15/2014 18:18   Mr Brain Wo Contrast  04/16/2014   CLINICAL DATA:  Right lower extremity pain and numbness. Possible stroke. History of prior stroke with resultant aphasia as well as atrial fibrillation on Coumadin.  EXAM: MRI HEAD WITHOUT CONTRAST  MRA HEAD WITHOUT CONTRAST  TECHNIQUE: Multiplanar, multiecho pulse sequences of the brain and surrounding structures were obtained without intravenous  contrast. Angiographic images of the head were obtained using MRA technique without contrast.  COMPARISON:  Head CT 04/15/2014.  Head MRI/ MRA 12/20/2007.  FINDINGS: MRI HEAD FINDINGS  There is no acute infarct. There is a small, subacute subdural hematoma along the right cerebral convexity measuring up to 4 mm in thickness in the frontal region. There is no associated mass effect. There is trace leftward midline shift. Encephalomalacia is seen in the left frontal and left parietal lobes from remote infarcts. Additional foci of T2 hyperintensity elsewhere in the subcortical and deep cerebral white matter bilaterally are nonspecific but compatible with mild to moderate chronic small vessel ischemic disease. There is moderate cerebral atrophy. Extra-axial mass along the planum sphenoidale has not significantly changed in size, measuring 3.2 x 2.8 x 2.1 cm.  Prior bilateral cataract extraction is noted. Right greater than left maxillary sinus mucosal thickening is noted in addition to mild right ethmoid air cell mucosal thickening. Mastoid air cells are clear. Major intracranial vascular flow voids are preserved.  MRA HEAD FINDINGS  Images are mildly to moderately degraded  by motion artifact. Visualized distal vertebral arteries are patent with the left being dominant. Right vertebral artery is particularly small distal to the PICA origin. Both PICA origins are patent. SCA origins are patent. Basilar artery is patent without stenosis. The PCAs are unremarkable aside from mild branch vessel irregularity, similar to the prior study. There is a small left posterior communicating artery.  Internal carotid arteries are patent from skullbase to carotid termini without stenosis. ACAs and right MCA are unremarkable aside from mild branch vessel irregularity. There is no proximal left MCA stenosis, however diminished number of M2 and more distal left MCA branch vessels are again noted. No intracranial aneurysm is identified.   IMPRESSION: 1. Small, subacute right cerebral convexity subdural hematoma. No significant mass effect. 2. Unchanged mass along the planum sphenoidale, consistent with a meningioma. 3. Remote left MCA territory infarcts. 4. Mild-to-moderate chronic small vessel ischemic disease. 5. No proximal intracranial occlusion or stenosis. Chronic occlusion of smaller left MCA branch vessels related to prior infarcts. Critical Value/emergent results were called by telephone at the time of interpretation on 04/16/2014 at 9:43 pm to Tylene Fantasia, NP, who verbally acknowledged these results.   Electronically Signed   By: Logan Bores   On: 04/16/2014 21:47   Dg Pelvis Portable  04/15/2014   CLINICAL DATA:  Recent pain.  Previous surgery right proximal femur  EXAM: PORTABLE PELVIS 1-2 VIEWS  COMPARISON:  None.  FINDINGS: Patient is status post screw and rod fixation in the proximal right femur. There is no acute fracture or dislocation. There is mild symmetric narrowing of both hip joints. There is vascular calcification in the pelvis.  IMPRESSION: Postoperative change right proximal femur. Mild symmetric narrowing both hip joints. No acute fracture or dislocation.   Electronically Signed   By: Lowella Grip M.D.   On: 04/15/2014 21:55   Dg Chest Port 1 View  04/15/2014   CLINICAL DATA:  Shortness of breath.  EXAM: PORTABLE CHEST - 1 VIEW  COMPARISON:  01/06/2014  FINDINGS: There is moderate cardiomegaly. Upper mediastinal contours are stable from prior. Lower mediastinal mass correlating with hiatal hernia. Chronic interstitial coarsening. There is no edema, consolidation, effusion, or pneumothorax. Remote right humeral neck fracture.  IMPRESSION: No active disease.   Electronically Signed   By: Jorje Guild M.D.   On: 04/15/2014 21:50   Mr Jodene Nam Head/brain Wo Cm  04/16/2014   CLINICAL DATA:  Right lower extremity pain and numbness. Possible stroke. History of prior stroke with resultant aphasia as well as atrial  fibrillation on Coumadin.  EXAM: MRI HEAD WITHOUT CONTRAST  MRA HEAD WITHOUT CONTRAST  TECHNIQUE: Multiplanar, multiecho pulse sequences of the brain and surrounding structures were obtained without intravenous contrast. Angiographic images of the head were obtained using MRA technique without contrast.  COMPARISON:  Head CT 04/15/2014.  Head MRI/ MRA 12/20/2007.  FINDINGS: MRI HEAD FINDINGS  There is no acute infarct. There is a small, subacute subdural hematoma along the right cerebral convexity measuring up to 4 mm in thickness in the frontal region. There is no associated mass effect. There is trace leftward midline shift. Encephalomalacia is seen in the left frontal and left parietal lobes from remote infarcts. Additional foci of T2 hyperintensity elsewhere in the subcortical and deep cerebral white matter bilaterally are nonspecific but compatible with mild to moderate chronic small vessel ischemic disease. There is moderate cerebral atrophy. Extra-axial mass along the planum sphenoidale has not significantly changed in size, measuring 3.2 x 2.8 x 2.1  cm.  Prior bilateral cataract extraction is noted. Right greater than left maxillary sinus mucosal thickening is noted in addition to mild right ethmoid air cell mucosal thickening. Mastoid air cells are clear. Major intracranial vascular flow voids are preserved.  MRA HEAD FINDINGS  Images are mildly to moderately degraded by motion artifact. Visualized distal vertebral arteries are patent with the left being dominant. Right vertebral artery is particularly small distal to the PICA origin. Both PICA origins are patent. SCA origins are patent. Basilar artery is patent without stenosis. The PCAs are unremarkable aside from mild branch vessel irregularity, similar to the prior study. There is a small left posterior communicating artery.  Internal carotid arteries are patent from skullbase to carotid termini without stenosis. ACAs and right MCA are unremarkable  aside from mild branch vessel irregularity. There is no proximal left MCA stenosis, however diminished number of M2 and more distal left MCA branch vessels are again noted. No intracranial aneurysm is identified.  IMPRESSION: 1. Small, subacute right cerebral convexity subdural hematoma. No significant mass effect. 2. Unchanged mass along the planum sphenoidale, consistent with a meningioma. 3. Remote left MCA territory infarcts. 4. Mild-to-moderate chronic small vessel ischemic disease. 5. No proximal intracranial occlusion or stenosis. Chronic occlusion of smaller left MCA branch vessels related to prior infarcts. Critical Value/emergent results were called by telephone at the time of interpretation on 04/16/2014 at 9:43 pm to Tylene Fantasia, NP, who verbally acknowledged these results.   Electronically Signed   By: Logan Bores   On: 04/16/2014 21:47    Scheduled Meds: . diltiazem  180 mg Oral QAC breakfast  . ferrous sulfate  325 mg Oral BID WC  . furosemide  80 mg Oral Daily  . loratadine  10 mg Oral Daily  . multivitamin with minerals  1 tablet Oral Daily  . pantoprazole  40 mg Oral Daily  . potassium chloride  10 mEq Oral Daily  . predniSONE  10 mg Oral QODAY  . simvastatin  10 mg Oral q morning - 10a  . sodium chloride  3 mL Intravenous Q12H   Continuous Infusions:   Principal Problem:   Numbness of right lower extremity Active Problems:   Arteriovenous malformation of gastrointestinal tract   TIA (transient ischemic attack)   History of COPD   History of CHF (congestive heart failure)   Chronic atrial fibrillation    Time spent: 35 min    Kelvin Cellar  [facility] Pager [phone]. If 7PM-7AM, please contact night-coverage at [email or web address], password [facility] 04/17/2014, 3:22 PM  LOS: 2 days

## 2014-04-17 NOTE — Progress Notes (Signed)
Speech Language Pathology Treatment: Dysphagia  Patient Details Name: Angelica [name] MRN: [id number] DOB: 1923/03/08 Today's Date: 04/17/2014 Time: 1761-6073 SLP Time Calculation (min): 19 min  Assessment / Plan / Recommendation Clinical Impression  Pt demonstrates need for objective assessment to better evaluate aspiration risk and determine the most appropriate diet. She demonstrates signs of aspiration across all consistencies (wet voice, delayed throat clear). Although some baseline swallowing deficits are reported it is not clear whether these symptoms are consistent with her previous functioning and history of GERD and COPD or whether they represent a worsening. Pt has been consuming regular diet without signs of respiratory distress. Will continue regular diet for now. SLP to follow up with results of MBSS and continue to follow.   HPI HPI: 80 y.o. female with multiple risk factors for stroke as well as previous left MCA territory infarction presenting with possible subcortical left TIA or ischemic stroke. Failed RN stroke swallow due to wet vocal quality. No history of being seen by SLP in chart.    Pertinent Vitals Pain Assessment: No/denies pain  SLP Plan  MBS    Recommendations Diet recommendations: Regular;Thin liquid Medication Administration: Whole meds with liquid              Oral Care Recommendations: Oral care BID Follow up Recommendations: Skilled Nursing facility Plan: MBS    GO     Eden Emms 04/17/2014, 10:44 AM

## 2014-04-17 NOTE — Progress Notes (Signed)
Physical medicine and rehabilitation consult requested with chart reviewed. Patient is currently under observation status and will not meet criteria medically for acute inpatient rehabilitation at this time as she is in observation status. Recommendations appear to be for skilled nursing facility. Hold on formal rehabilitation consult at this time

## 2014-04-18 ENCOUNTER — Inpatient Hospital Stay (HOSPITAL_COMMUNITY): Payer: Medicare Other

## 2014-04-18 DIAGNOSIS — G458 Other transient cerebral ischemic attacks and related syndromes: Secondary | ICD-10-CM

## 2014-04-18 DIAGNOSIS — E876 Hypokalemia: Secondary | ICD-10-CM

## 2014-04-18 LAB — CBC
HCT: 38.8 % (ref 36.0–46.0)
Hemoglobin: 11.6 g/dL — ABNORMAL LOW (ref 12.0–15.0)
MCH: 26.8 pg (ref 26.0–34.0)
MCHC: 29.9 g/dL — ABNORMAL LOW (ref 30.0–36.0)
MCV: 89.6 fL (ref 78.0–100.0)
PLATELETS: 450 10*3/uL — AB (ref 150–400)
RBC: 4.33 MIL/uL (ref 3.87–5.11)
RDW: 16.5 % — ABNORMAL HIGH (ref 11.5–15.5)
WBC: 7.8 10*3/uL (ref 4.0–10.5)

## 2014-04-18 LAB — PROTIME-INR
INR: 1.11 (ref 0.00–1.49)
PROTHROMBIN TIME: 14.4 s (ref 11.6–15.2)

## 2014-04-18 MED ORDER — SENNOSIDES-DOCUSATE SODIUM 8.6-50 MG PO TABS: 1.0000 | ORAL_TABLET | Freq: Every evening | ORAL | Status: DC | PRN

## 2014-04-18 MED ORDER — WARFARIN SODIUM 4 MG PO TABS
4.0000 mg | ORAL_TABLET | Freq: Once | ORAL | Status: DC
  Filled 2014-04-18: qty 1

## 2014-04-18 MED ORDER — POTASSIUM CHLORIDE ER 10 MEQ PO TBCR: 20.0000 meq | EXTENDED_RELEASE_TABLET | Freq: Every day | ORAL | Status: DC

## 2014-04-18 NOTE — Discharge Summary (Signed)
Physician Discharge Summary  Angelica [name] ZSW:[id number] DOB: 1923-06-14 DOA: 04/15/2014  PCP: [name], MD  Admit date: 04/15/2014 Discharge date: 04/18/2014  Time spent: 35 minutes  Recommendations for Outpatient Follow-up:  1. Please follow up on a BMP in 3-4 days as she was hypokalemic during this hospitalization 2. PT/INR check twice weekly on Tues and Thurs, with INR goal of 1.8 to 2.0. On day of discharge her INR was 1.1. 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    Discharge Diagnoses:  Principal Problem:   Numbness of right lower extremity Active Problems:   Arteriovenous malformation of gastrointestinal tract   TIA (transient ischemic attack)   History of COPD   History of CHF (congestive heart failure)   Chronic atrial fibrillation   Discharge Condition: Stable  Diet recommendation: Heart Healthy  Filed Weights   04/15/14 2018  Weight: 63.776 kg (140 lb 9.6 oz)    History of present illness:  Angelica [name] is a 80 y.o. female with history of previous CVA with resultant aphasia, atrial fibrillation on Coumadin, history of AV malformation and GI bleed, CHF and COPD was brought to the ER after patient was found to have numbness of the right lower extremity with pain. Most of the history is provided by the patient's daughter [place]. Patient daughter states that patient was doing fine today morning but later in the day when another caregiver came patient was complaining of pain in the right lower extremity and also was found to have some numbness with decreased sensation. Patient also found it difficult to walk because of pain on doing so. Patient was brought to the ER and CT head was negative for anything acute and on-call neurologist was consulted and patient has been admitted for further management for possible CVA versus TIA. Patient is on Coumadin and is therapeutic. Patient's daughter stated that  patient also was found to be mildly hypoxic but at this time patient is saturating 98% on room. Patient denies any chest pain or shortness of breath.    Hospital Course:  Patient is a pleasant 80 year old female with a history of atrial fibrillation, hypertension, history of CVA, admitted to the medicine service on 04/15/2014, presenting with complaints of numbness involving right lower extremity. She was initially worked up with a CT scan of brain which did not show evidence of acute intracranial changes, radiology had not reported acute intracranial hemorrhage. This was followed up with MRI of the brain that showed a small subacute right cerebral convexity subdural hematoma with no significant mass effect, no evidence of acute infarct. Since she was a supratherapeutic with INR she was given 10 mg of vitamin K. INR trending down from 3.31 on day of admission to 1.1 on day of discharge.   1. Subdural hematoma -MRI showing not showing no acute infarct  -Radiology reporting a subacute subdural hematoma along the right cerebral convexity measuring up to 4 mm  -Initial CT scan of brain, however, did not reveal evidence of acute intracranial hemorrhage.  -Question if this could be chronic bleed; Family reporting that she has had multiple falls at home. She has history of functional decline and unsteady gait, has caregiver support at home, felt she would benefit from SNF  -Case was discussed with neurology, agree with the continuation of anticoagulation with target INR near 1.8. Labs showing INR of 1.1 on day of discharge. Will continue Warfarin allowing from a gradual rise in her INR,  with INR goal to be 1.8-2.0 -Was discharged to SNF in stable condition 2. Atrial fibrillation  -Patient is rate controlled, with ventricular rates in the 60s to 80s  -Have been chronically anticoagulated with Coumadin presenting with INR of 3.31  -Pharmacy has been consulted for warfarin management  -Continue diltiazem 180  mg by mouth daily  -She received Vit K during this hospitalization, INR 1.1 on day of discharge. Will discharge on Warfarin 6 mg PO q daily allowing for gradual increase in INR given history of SDH and GI bleed.  3. History of AVM/GI bleed  -Presented with INR of 3.31, CBC showing hemoglobin 11.5  -Remains hemodynamically stable, no evidence of GI bleed currently  4. Dyslipidemia  -Continue statin therapy  5. Hypertension.  -Blood pressure stable, continue diltiazem    Consultations:  Neurology  Discharge Exam: Filed Vitals:   04/18/14 0915  BP: 119/56  Pulse: 75  Temp: 97.9 F (36.6 C)  Resp: 18     General: Patient is in no acute distress, awake alert oriented to time and place  Cardiovascular: Regular rate and rhythm normal S1-S2  Respiratory: Normal respiratory effort, lungs clear to auscultation  Abdomen: Soft nontender  Musculoskeletal: No edema  Neurological: Showed 5 of 5 muscle strength to bilateral upper and lower extremities, reporting resolution to numbness involving right lower extremity  Discharge Instructions You were cared for by a hospitalist during your hospital stay. If you have any questions about your discharge medications or the care you received while you were in the hospital after you are discharged, you can call the unit and asked to speak with the hospitalist on call if the hospitalist that took care of you is not available. Once you are discharged, your primary care physician will handle any further medical issues. Please note that NO REFILLS for any discharge medications will be authorized once you are discharged, as it is imperative that you return to your primary care physician (or establish a relationship with a primary care physician if you do not have one) for your aftercare needs so that they can reassess your need for medications and monitor your lab values.  Discharge Instructions   (HEART FAILURE PATIENTS) Call MD:  Anytime you have any of the  following symptoms: 1) 3 pound weight gain in 24 hours or 5 pounds in 1 week 2) shortness of breath, with or without a dry hacking cough 3) swelling in the hands, feet or stomach 4) if you have to sleep on extra pillows at night in order to breathe.    Complete by:  As directed      Call MD for:  difficulty breathing, headache or visual disturbances    Complete by:  As directed      Call MD for:  extreme fatigue    Complete by:  As directed      Call MD for:  hives    Complete by:  As directed      Call MD for:  persistant dizziness or light-headedness    Complete by:  As directed      Call MD for:  persistant nausea and vomiting    Complete by:  As directed      Call MD for:  redness, tenderness, or signs of infection (pain, swelling, redness, odor or green/yellow discharge around incision site)    Complete by:  As directed      Call MD for:  severe uncontrolled pain    Complete by:  As directed  Call MD for:  temperature >100.4    Complete by:  As directed      Diet - low sodium heart healthy    Complete by:  As directed      Increase activity slowly    Complete by:  As directed           Current Discharge Medication List    START taking these medications   Details  senna-docusate (SENOKOT-S) 8.6-50 MG per tablet Take 1 tablet by mouth at bedtime as needed for mild constipation. Qty: 30 tablet, Refills: 1      CONTINUE these medications which have CHANGED   Details  potassium chloride (K-DUR) 10 MEQ tablet Take 2 tablets (20 mEq total) by mouth daily. Qty: 30 tablet, Refills: 0      CONTINUE these medications which have NOT CHANGED   Details  diltiazem (DILACOR XR) 180 MG 24 hr capsule Take 180 mg by mouth daily before breakfast.     ferrous sulfate 325 (65 FE) MG tablet Take 325 mg by mouth 2 (two) times daily with a meal.     furosemide (LASIX) 80 MG tablet Take 80 mg by mouth daily.     loratadine (ALLERGY) 10 MG tablet Take 10 mg by mouth daily.    Multiple  Vitamins-Minerals (MULTIVITAMINS THER. W/MINERALS) TABS Take 1 tablet by mouth every evening.     pantoprazole (PROTONIX) 40 MG tablet Take 40 mg by mouth daily.    predniSONE (DELTASONE) 5 MG tablet Take 10 mg by mouth every other day.     simvastatin (ZOCOR) 10 MG tablet Take 10 mg by mouth every morning.     warfarin (COUMADIN) 4 MG tablet Take 6 mg by mouth daily.       Allergies  Allergen Reactions  . Amoxicillin Hives  . Penicillins Hives   Follow-up Information   Follow up with Xu,Jindong, MD. Schedule an appointment as soon as possible for a visit in 2 months. (stroke clinic)    Specialty:  Neurology   Contact information:   [address] [place] [address] [phone]       Follow up with [name], MD.   Specialty:  Internal Medicine   Contact information:   [place] [place] [address] [phone]        The results of significant diagnostics from this hospitalization (including imaging, microbiology, ancillary and laboratory) are listed below for reference.    Significant Diagnostic Studies: Ct Head Wo Contrast  04/15/2014   CLINICAL DATA:  80 year old hypertensive female with history of atrial fibrillation and prior stroke. Numbness right leg. Code stroke. Initial encounter.  EXAM: CT HEAD WITHOUT CONTRAST  TECHNIQUE: Contiguous axial images were obtained from the base of the skull through the vertex without intravenous contrast.  COMPARISON:  11/14/2012.  FINDINGS: No intracranial hemorrhage.  Remote large left hemispheric infarct with encephalomalacia left frontal and parietal lobe.  No CT evidence of large acute infarct.  Small vessel disease type changes.  Global atrophy without hydrocephalus.  Vascular calcifications.  No intracranial mass lesion noted on this unenhanced exam.  Moderate mucosal thickening maxillary sinuses with mucosal thickening/ partial opacification ethmoid sinus air cells.  IMPRESSION:  No intracranial hemorrhage.  Remote large left hemispheric infarct with encephalomalacia left frontal and parietal lobe.  No CT evidence of large acute infarct.  Small vessel disease type changes.  Global atrophy without hydrocephalus.  Moderate mucosal thickening maxillary sinuses with mucosal thickening/ partial opacification ethmoid sinus air  cells.  These results were called by telephone at the time of interpretation on 04/15/2014 at 6:15 pm to Dr. Nicole Kindred who verbally acknowledged these results.   Electronically Signed   By: Chauncey Cruel M.D.   On: 04/15/2014 18:18   Mr Brain Wo Contrast  04/16/2014   CLINICAL DATA:  Right lower extremity pain and numbness. Possible stroke. History of prior stroke with resultant aphasia as well as atrial fibrillation on Coumadin.  EXAM: MRI HEAD WITHOUT CONTRAST  MRA HEAD WITHOUT CONTRAST  TECHNIQUE: Multiplanar, multiecho pulse sequences of the brain and surrounding structures were obtained without intravenous contrast. Angiographic images of the head were obtained using MRA technique without contrast.  COMPARISON:  Head CT 04/15/2014.  Head MRI/ MRA 12/20/2007.  FINDINGS: MRI HEAD FINDINGS  There is no acute infarct. There is a small, subacute subdural hematoma along the right cerebral convexity measuring up to 4 mm in thickness in the frontal region. There is no associated mass effect. There is trace leftward midline shift. Encephalomalacia is seen in the left frontal and left parietal lobes from remote infarcts. Additional foci of T2 hyperintensity elsewhere in the subcortical and deep cerebral white matter bilaterally are nonspecific but compatible with mild to moderate chronic small vessel ischemic disease. There is moderate cerebral atrophy. Extra-axial mass along the planum sphenoidale has not significantly changed in size, measuring 3.2 x 2.8 x 2.1 cm.  Prior bilateral cataract extraction is noted. Right greater than left maxillary sinus mucosal thickening is noted  in addition to mild right ethmoid air cell mucosal thickening. Mastoid air cells are clear. Major intracranial vascular flow voids are preserved.  MRA HEAD FINDINGS  Images are mildly to moderately degraded by motion artifact. Visualized distal vertebral arteries are patent with the left being dominant. Right vertebral artery is particularly small distal to the PICA origin. Both PICA origins are patent. SCA origins are patent. Basilar artery is patent without stenosis. The PCAs are unremarkable aside from mild branch vessel irregularity, similar to the prior study. There is a small left posterior communicating artery.  Internal carotid arteries are patent from skullbase to carotid termini without stenosis. ACAs and right MCA are unremarkable aside from mild branch vessel irregularity. There is no proximal left MCA stenosis, however diminished number of M2 and more distal left MCA branch vessels are again noted. No intracranial aneurysm is identified.  IMPRESSION: 1. Small, subacute right cerebral convexity subdural hematoma. No significant mass effect. 2. Unchanged mass along the planum sphenoidale, consistent with a meningioma. 3. Remote left MCA territory infarcts. 4. Mild-to-moderate chronic small vessel ischemic disease. 5. No proximal intracranial occlusion or stenosis. Chronic occlusion of smaller left MCA branch vessels related to prior infarcts. Critical Value/emergent results were called by telephone at the time of interpretation on 04/16/2014 at 9:43 pm to Tylene Fantasia, NP, who verbally acknowledged these results.   Electronically Signed   By: Logan Bores   On: 04/16/2014 21:47   Dg Pelvis Portable  04/15/2014   CLINICAL DATA:  Recent pain.  Previous surgery right proximal femur  EXAM: PORTABLE PELVIS 1-2 VIEWS  COMPARISON:  None.  FINDINGS: Patient is status post screw and rod fixation in the proximal right femur. There is no acute fracture or dislocation. There is mild symmetric narrowing of both hip  joints. There is vascular calcification in the pelvis.  IMPRESSION: Postoperative change right proximal femur. Mild symmetric narrowing both hip joints. No acute fracture or dislocation.   Electronically Signed   By: Gwyndolyn Saxon  Jasmine December M.D.   On: 04/15/2014 21:55   Dg Chest Port 1 View  04/15/2014   CLINICAL DATA:  Shortness of breath.  EXAM: PORTABLE CHEST - 1 VIEW  COMPARISON:  01/06/2014  FINDINGS: There is moderate cardiomegaly. Upper mediastinal contours are stable from prior. Lower mediastinal mass correlating with hiatal hernia. Chronic interstitial coarsening. There is no edema, consolidation, effusion, or pneumothorax. Remote right humeral neck fracture.  IMPRESSION: No active disease.   Electronically Signed   By: Jorje Guild M.D.   On: 04/15/2014 21:50   Dg Swallowing Func-speech Pathology  04/18/2014   [name] [place], CCC-SLP     04/18/2014 10:03 AM Objective Swallowing Evaluation: Modified Barium Swallowing Study   Patient Details  Name: Unknown Flannigan MRN: [id number] Date of Birth: October 28, 1922  Today's Date: 04/18/2014 Time: 0930-0950 SLP Time Calculation (min): 20 min  Past Medical History:  Past Medical History  Diagnosis Date  . Atrial fibrillation   . Hypertension   . Arthritis   . GERD (gastroesophageal reflux disease)   . COPD (chronic obstructive pulmonary disease)   . PNA (pneumonia)   . Anemia due to GI blood loss 07/11/2011  . Iron deficiency anemia secondary to blood loss (chronic)  07/11/2011  . 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 Stroke 2009    aphasia and memory impairment as residual  . Hypoxia   . Atrial fibrillation, permanent, rate controlled on coumadin  05/10/2011  . Hypogammaglobulinemia 06/01/2012    Immunoglobulins 05/01/2012-normal IgG M., normal IgA,  low IgG  [phone]).   . 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 CVA history 05/10/2011  . CHF (congestive heart failure) 82/50/5397    diastolic  . History of nuclear stress test 2009     normal dobutamine myoview   Past Surgical History:  Past Surgical History  Procedure Laterality Date  . Hip fracture surgery    . Cholecystectomy    . Knee surgery    . Cataract extraction    . Transthoracic echocardiogram  06/2011    EF 55-60%; mild LVH; LA & RA mod dilated; mod TR   HPI:  80 y.o. female with multiple risk factors for stroke as well as  previous left MCA territory infarction presenting with possible  subcortical left TIA or ischemic stroke. Failed RN stroke swallow  due to wet vocal quality. No history of being seen by SLP in  chart.      Assessment / Plan / Recommendation Clinical Impression  Dysphagia Diagnosis: Mild oral phase dysphagia;Moderate  pharyngeal phase dysphagia Clinical impression: Pt demonstrates a mild oral dysphagia with  brief oral holding of all boluses prior to swallow though there  is intermittent bolus loss with premature spillage. This results  in penetration and aspiration before the swallow with thin and  nectar thick textures, not eliminated by a chin tuck. There is  delayed sensation of aspiration with a hard cough or throat clear  but penetration is silent. Attempted to cue pt to clear throat  with success in 1 out of 10 trials due to aphasia. Given that  there is no benefit to thickened liquids pt should continue  current diet of regular solids and thin liquids but with further  training for a throat clear in acute setting and at next level of  care. Will discuss with dtr.     Treatment Recommendation  Therapy as outlined in treatment plan below    Diet Recommendation Regular;Thin liquid   Liquid Administration via: Cup;No straw Medication Administration: Whole  meds with puree Supervision: Patient able to self feed;Intermittent supervision  to cue for compensatory strategies Compensations: Slow rate;Small sips/bites;Clear throat  intermittently Postural Changes and/or Swallow Maneuvers: Seated upright 90  degrees    Other  Recommendations Oral Care Recommendations:  Oral care BID   Follow Up Recommendations  Skilled Nursing facility    Frequency and Duration min 2x/week  1 week   Pertinent Vitals/Pain NA    SLP Swallow Goals     General HPI: 80 y.o. female with multiple risk factors for stroke  as well as previous left MCA territory infarction presenting with  possible subcortical left TIA or ischemic stroke. Failed RN  stroke swallow due to wet vocal quality. No history of being seen  by SLP in chart.  Type of Study: Modified Barium Swallowing Study Reason for Referral: Objectively evaluate swallowing function Previous Swallow Assessment: none  Diet Prior to this Study: Regular;Thin liquids Temperature Spikes Noted: No Respiratory Status: Room air History of Recent Intubation: No Behavior/Cognition: Alert;Cooperative;Requires cueing Oral Cavity - Dentition: Dentures, top Oral Motor / Sensory Function: Within functional limits Self-Feeding Abilities: Able to feed self Patient Positioning: Upright in chair Baseline Vocal Quality: Clear Volitional Cough: Cognitively unable to elicit Volitional Swallow: Unable to elicit Anatomy: Within functional limits Pharyngeal Secretions: Not observed secondary MBS    Reason for Referral Objectively evaluate swallowing function   Oral Phase Oral Preparation/Oral Phase Oral Phase: Impaired Oral - Nectar Oral - Nectar Cup: Holding of bolus;Delayed oral transit Oral - Thin Oral - Thin Cup: Holding of bolus;Delayed oral transit Oral - Thin Straw: Holding of bolus;Delayed oral transit Oral - Solids Oral - Puree: Holding of bolus;Delayed oral transit Oral - Regular: Holding of bolus;Delayed oral transit;Other  (Comment) (prolonged mastication)   Pharyngeal Phase Pharyngeal Phase Pharyngeal Phase: Impaired Pharyngeal - Nectar Pharyngeal - Nectar Cup: Premature spillage to pyriform  sinuses;Penetration/Aspiration before swallow;Trace aspiration Penetration/Aspiration details (nectar cup): Material does not  enter airway;Material enters airway,  CONTACTS cords and not  ejected out;Material enters airway, passes BELOW cords then  ejected out Pharyngeal - Thin Pharyngeal - Thin Cup: Premature spillage to pyriform  sinuses;Penetration/Aspiration before swallow;Trace aspiration Penetration/Aspiration details (thin cup): Material does not  enter airway;Material enters airway, passes BELOW cords then  ejected out;Material enters airway, passes BELOW cords and not  ejected out despite cough attempt by patient;Material enters  airway, CONTACTS cords and not ejected out Pharyngeal - Thin Straw: Premature spillage to pyriform  sinuses;Penetration/Aspiration before swallow;Trace aspiration Penetration/Aspiration details (thin straw): Material enters  airway, passes BELOW cords then ejected out Pharyngeal - Solids Pharyngeal - Puree: Within functional limits Pharyngeal - Regular: Within functional limits Pharyngeal - Pill: Not tested  Cervical Esophageal Phase    GO    Cervical Esophageal Phase Cervical Esophageal Phase: [facility]        Herbie [place], MA CCC-SLP [phone]  DeBlois, [name] 04/18/2014, 10:02 AM    Mr Jodene Nam Head/brain Wo Cm  04/16/2014   CLINICAL DATA:  Right lower extremity pain and numbness. Possible stroke. History of prior stroke with resultant aphasia as well as atrial fibrillation on Coumadin.  EXAM: MRI HEAD WITHOUT CONTRAST  MRA HEAD WITHOUT CONTRAST  TECHNIQUE: Multiplanar, multiecho pulse sequences of the brain and surrounding structures were obtained without intravenous contrast. Angiographic images of the head were obtained using MRA technique without contrast.  COMPARISON:  Head CT 04/15/2014.  Head MRI/ MRA 12/20/2007.  FINDINGS: MRI HEAD FINDINGS  There is no acute infarct. There is a small,  subacute subdural hematoma along the right cerebral convexity measuring up to 4 mm in thickness in the frontal region. There is no associated mass effect. There is trace leftward midline shift. Encephalomalacia is seen in the left frontal and left  parietal lobes from remote infarcts. Additional foci of T2 hyperintensity elsewhere in the subcortical and deep cerebral white matter bilaterally are nonspecific but compatible with mild to moderate chronic small vessel ischemic disease. There is moderate cerebral atrophy. Extra-axial mass along the planum sphenoidale has not significantly changed in size, measuring 3.2 x 2.8 x 2.1 cm.  Prior bilateral cataract extraction is noted. Right greater than left maxillary sinus mucosal thickening is noted in addition to mild right ethmoid air cell mucosal thickening. Mastoid air cells are clear. Major intracranial vascular flow voids are preserved.  MRA HEAD FINDINGS  Images are mildly to moderately degraded by motion artifact. Visualized distal vertebral arteries are patent with the left being dominant. Right vertebral artery is particularly small distal to the PICA origin. Both PICA origins are patent. SCA origins are patent. Basilar artery is patent without stenosis. The PCAs are unremarkable aside from mild branch vessel irregularity, similar to the prior study. There is a small left posterior communicating artery.  Internal carotid arteries are patent from skullbase to carotid termini without stenosis. ACAs and right MCA are unremarkable aside from mild branch vessel irregularity. There is no proximal left MCA stenosis, however diminished number of M2 and more distal left MCA branch vessels are again noted. No intracranial aneurysm is identified.  IMPRESSION: 1. Small, subacute right cerebral convexity subdural hematoma. No significant mass effect. 2. Unchanged mass along the planum sphenoidale, consistent with a meningioma. 3. Remote left MCA territory infarcts. 4. Mild-to-moderate chronic small vessel ischemic disease. 5. No proximal intracranial occlusion or stenosis. Chronic occlusion of smaller left MCA branch vessels related to prior infarcts. Critical Value/emergent results were called by telephone at the time  of interpretation on 04/16/2014 at 9:43 pm to Tylene Fantasia, NP, who verbally acknowledged these results.   Electronically Signed   By: Logan Bores   On: 04/16/2014 21:47    Microbiology: No results found for this or any previous visit (from the past 240 hour(s)).   Labs: Basic Metabolic Panel:  Recent Labs Lab 04/15/14 1755 04/15/14 1807 04/15/14 1819 04/16/14 0610 04/17/14 0544  NA 141 139  --  143 145  K 3.6* 3.2*  --  3.4* 3.9  CL 103 100  --  105 105  CO2 27  --   --  28 25  GLUCOSE 105* 107*  --  87 96  BUN 13 12  --  10 10  CREATININE 0.95 1.00 1.10 0.84 0.94  CALCIUM 9.3  --   --  8.7 9.0  MG  --   --   --  1.9  --    Liver Function Tests:  Recent Labs Lab 04/15/14 1755 04/16/14 0610  AST 21 15  ALT 12 10  ALKPHOS 130* 119*  BILITOT 0.2* 0.2*  PROT 5.4* 5.1*  ALBUMIN 2.6* 2.2*   No results found for this basename: LIPASE, AMYLASE,  in the last 168 hours No results found for this basename: AMMONIA,  in the last 168 hours CBC:  Recent Labs Lab 04/15/14 1755 04/15/14 1807 04/16/14 0610 04/17/14 0544 04/18/14 0503  WBC 6.3  --  8.2 8.2 7.8  NEUTROABS 3.9  --  6.2  --   --   HGB 10.8* 12.6 10.3* 11.5* 11.6*  HCT 35.2* 37.0 33.1* 38.5 38.8  MCV 85.2  --  84.9 87.5 89.6  PLT 376  --  388 431* 450*   Cardiac Enzymes:  Recent Labs Lab 04/15/14 2333  TROPONINI <0.30   BNP: BNP (last 3 results)  Recent Labs  04/15/14 2333  PROBNP 1258.0*   CBG: No results found for this basename: GLUCAP,  in the last 168 hours     Signed:  Kelvin Cellar  [facility] 04/18/2014, 12:18 PM

## 2014-04-18 NOTE — Progress Notes (Signed)
Pt is being discharged to [facility]. Discharge package was given to Transportation.report called and given to [facility]

## 2014-04-18 NOTE — Progress Notes (Signed)
Physical Therapy Treatment Patient Details Name: Angelica [name] MRN: [id number] DOB: 30-Jun-1923 Today's Date: 04/18/2014    History of Present Illness Angelica [name] is a 80 y.o. female with history of previous CVA with resultant aphasia, atrial fibrillation on Coumadin, history of AV malformation and GI bleed, CHF and COPD was brought to the ER after patient was found to have numbness of the right lower extremity with pain. Pt recently admitted in 7/15 with LLE cellulitis.    PT Comments    Patient progressing with improved stability with walker for transfers today and able to maintain continence with planned trip to [facility].  Feel she will either need capable 24 hour assist at d/c to home with HHPT or SNF rehab (recommended).    Follow Up Recommendations  SNF     Equipment Recommendations  None recommended by PT    Recommendations for Other Services       Precautions / Restrictions Precautions Precautions: Fall Precaution Comments: h/o falls    Mobility  Bed Mobility Overal bed mobility: Needs Assistance Bed Mobility: Rolling;Sidelying to Sit Rolling: Min assist Sidelying to sit: Min assist       General bed mobility comments: encouraged use of railing and assist with guiding LE's off bed  Transfers Overall transfer level: Needs assistance     Sit to Stand: Min assist Stand pivot transfers: Mod assist;Min assist       General transfer comment: initially pivot to [facility] no device and needed assist for safety and hand hold onto bedside commode, then pivotal steps to chair with walker and cues and less physical help needed.  Ambulation/Gait                 Stairs            Wheelchair Mobility    Modified Rankin (Stroke Patients Only) Modified Rankin (Stroke Patients Only) Pre-Morbid Rankin Score: Moderate disability Modified Rankin: Moderately severe disability     Balance Overall balance assessment: History of Falls;Needs assistance    Sitting balance-Leahy Scale: Fair Sitting balance - Comments: sitting with feet supported on floor, hands in lap and no loss of balance x about 1-2 minutes while setting up BSC   Standing balance support: Bilateral upper extremity supported Standing balance-Leahy Scale: Poor Standing balance comment: UE assist for balance                    Cognition Arousal/Alertness: Awake/alert Behavior During Therapy: WFL for tasks assessed/performed Overall Cognitive Status: No family/caregiver present to determine baseline cognitive functioning                      Exercises General Exercises - Lower Extremity Ankle Circles/Pumps: AROM;AAROM;10 reps;Supine;Both Short Arc Quad: AROM;Both;10 reps;Supine Heel Slides: AROM;Supine;AAROM;Both;10 reps Hip ABduction/ADduction: AROM;Both;10 reps;Supine    General Comments General comments (skin integrity, edema, etc.): soiled with urine in bed, assisted to [facility] and pt voided without leakage during transfers      Pertinent Vitals/Pain Pain Assessment: No/denies pain Faces Pain Scale: Hurts little more Pain Location: right LE tender to touch noted dark (?ecchymosis) area on distal shin Pain Intervention(s): Monitored during session    Home Living                      Prior Function            PT Goals (current goals can now be found in the care plan section) Progress towards PT goals: Progressing toward  goals    Frequency  Min 3X/week    PT Plan Discharge plan needs to be updated    Co-evaluation             End of Session Equipment Utilized During Treatment: Gait belt Activity Tolerance: Patient tolerated treatment well Patient left: in chair;with call bell/phone within reach;with chair alarm set     Time: 1135-1207 PT Time Calculation (min): 32 min  Charges:  $Therapeutic Exercise: 8-22 mins $Therapeutic Activity: 8-22 mins                    G Codes:      WYNN,CYNDI 19-Apr-2014, 12:16  PM Angelica [name], Angelica [name] Apr 19, 2014

## 2014-04-18 NOTE — Progress Notes (Signed)
[place] for warfarin Indication: atrial fibrillation  Allergies  Allergen Reactions  . Amoxicillin Hives  . Penicillins Hives    Patient Measurements: Height: 5\' 4"  (162.6 cm) Weight: 140 lb 9.6 oz (63.776 kg) IBW/kg (Calculated) : 54.7  Vital Signs: Temp: 97.9 F (36.6 C) (10/16 0915) Temp Source: Oral (10/16 0915) BP: 119/56 mmHg (10/16 0915) Pulse Rate: 75 (10/16 0915)  Labs:  Recent Labs  04/15/14 1755  04/15/14 1819 04/15/14 2333 04/16/14 0610 04/17/14 0544 04/18/14 0503  HGB 10.8*  < >  --   --  10.3* 11.5* 11.6*  HCT 35.2*  < >  --   --  33.1* 38.5 38.8  PLT 376  --   --   --  388 431* 450*  APTT 58*  --   --   --   --   --   --   LABPROT 33.6*  --   --   --  30.4* 20.5* 14.4  INR 3.31*  --   --   --  2.91* 1.74* 1.11  CREATININE 0.95  < > 1.10  --  0.84 0.94  --   TROPONINI  --   --   --  <0.30  --   --   --   < > = values in this interval not displayed.  Estimated Creatinine Clearance: 35 ml/min (by C-G formula based on Cr of 0.94).  Assessment: 80yo female presents with new onset numbness. MRI showed subacute SDH. Admission INR was elevated at 3.31- home dose of warfarin 6mg  PO daily, last dose taken on 04/14/14. Received 10mg  vitamin K and INR this morning was 1.74. Neurology re-entered warfarin consult with new, lower INR goal as patient's neuro status stable and possibility SDH is chronic. INR today 1.11  Goal of Therapy:  INR 1.8-2.2 Monitor platelets by anticoagulation protocol: Yes   Plan:  1. Warfarin 4mg  po x1 tonight 2. Daily PT/INR 3. Follow closely for s/s bleeding, neuro status changes  Thanks for allowing pharmacy to be a part of this patient's care.  Excell Seltzer, PharmD Clinical Pharmacist, [phone]   04/18/2014 10:33 AM

## 2014-04-18 NOTE — Progress Notes (Signed)
Clinical Social Worker facilitated patient discharge including contacting patient family and facility to confirm patient discharge plans.  Clinical information faxed to facility and family agreeable with plan.  CSW arranged ambulance transport via [facility] to [place] and [place] for [place]. RN to call report prior to discharge.  Clinical Social Worker will sign off for now as social work intervention is no longer needed. Please consult [place] again if new need arises.  Glendon Axe, [place]

## 2014-04-18 NOTE — Procedures (Signed)
Objective Swallowing Evaluation: Modified Barium Swallowing Study  Patient Details  Name: Angelica [name] MRN: [id number] Date of Birth: Nov 22, 1922  Today's Date: 04/18/2014 Time: 0930-0950 SLP Time Calculation (min): 20 min  Past Medical History:  Past Medical History  Diagnosis Date  . Atrial fibrillation   . Hypertension   . Arthritis   . GERD (gastroesophageal reflux disease)   . COPD (chronic obstructive pulmonary disease)   . PNA (pneumonia)   . Anemia due to GI blood loss 07/11/2011  . 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 Stroke 2009    aphasia and memory impairment as residual  . Hypoxia   . Atrial fibrillation, permanent, rate controlled on coumadin 05/10/2011  . Hypogammaglobulinemia 06/01/2012    Immunoglobulins 05/01/2012-normal IgG M., normal IgA,  low IgG [phone]).   . 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 CVA history 05/10/2011  . CHF (congestive heart failure) 02/40/9735    diastolic  . History of nuclear stress test 2009    normal dobutamine myoview   Past Surgical History:  Past Surgical History  Procedure Laterality Date  . Hip fracture surgery    . Cholecystectomy    . Knee surgery    . Cataract extraction    . Transthoracic echocardiogram  06/2011    EF 55-60%; mild LVH; LA & RA mod dilated; mod TR   HPI:  80 y.o. female with multiple risk factors for stroke as well as previous left MCA territory infarction presenting with possible subcortical left TIA or ischemic stroke. Failed RN stroke swallow due to wet vocal quality. No history of being seen by SLP in chart.      Assessment / Plan / Recommendation Clinical Impression  Dysphagia Diagnosis: Mild oral phase dysphagia;Moderate pharyngeal phase dysphagia Clinical impression: Pt demonstrates a mild oral dysphagia with brief oral holding of all boluses prior to swallow though there is intermittent bolus  loss with premature spillage. This results in penetration and aspiration before the swallow with thin and nectar thick textures, not eliminated by a chin tuck. There is delayed sensation of aspiration with a hard cough or throat clear but penetration is silent. Attempted to cue pt to clear throat with success in 1 out of 10 trials due to aphasia. Given that there is no benefit to thickened liquids pt should continue current diet of regular solids and thin liquids but with further training for a throat clear in acute setting and at next level of care. Will discuss with dtr.     Treatment Recommendation  Therapy as outlined in treatment plan below    Diet Recommendation Regular;Thin liquid   Liquid Administration via: Cup;No straw Medication Administration: Whole meds with puree Supervision: Patient able to self feed;Intermittent supervision to cue for compensatory strategies Compensations: Slow rate;Small sips/bites;Clear throat intermittently Postural Changes and/or Swallow Maneuvers: Seated upright 90 degrees    Other  Recommendations Oral Care Recommendations: Oral care BID   Follow Up Recommendations  Skilled Nursing facility    Frequency and Duration min 2x/week  1 week   Pertinent Vitals/Pain NA    SLP Swallow Goals     General HPI: 80 y.o. female with multiple risk factors for stroke as well as previous left MCA territory infarction presenting with possible subcortical left TIA or ischemic stroke. Failed RN stroke swallow due to wet vocal quality. No history of being seen by SLP in chart.  Type of Study: Modified Barium Swallowing Study Reason for  Referral: Objectively evaluate swallowing function Previous Swallow Assessment: none  Diet Prior to this Study: Regular;Thin liquids Temperature Spikes Noted: No Respiratory Status: Room air History of Recent Intubation: No Behavior/Cognition: Alert;Cooperative;Requires cueing Oral Cavity - Dentition: Dentures, top Oral Motor /  Sensory Function: Within functional limits Self-Feeding Abilities: Able to feed self Patient Positioning: Upright in chair Baseline Vocal Quality: Clear Volitional Cough: Cognitively unable to elicit Volitional Swallow: Unable to elicit Anatomy: Within functional limits Pharyngeal Secretions: Not observed secondary MBS    Reason for Referral Objectively evaluate swallowing function   Oral Phase Oral Preparation/Oral Phase Oral Phase: Impaired Oral - Nectar Oral - Nectar Cup: Holding of bolus;Delayed oral transit Oral - Thin Oral - Thin Cup: Holding of bolus;Delayed oral transit Oral - Thin Straw: Holding of bolus;Delayed oral transit Oral - Solids Oral - Puree: Holding of bolus;Delayed oral transit Oral - Regular: Holding of bolus;Delayed oral transit;Other (Comment) (prolonged mastication)   Pharyngeal Phase Pharyngeal Phase Pharyngeal Phase: Impaired Pharyngeal - Nectar Pharyngeal - Nectar Cup: Premature spillage to pyriform sinuses;Penetration/Aspiration before swallow;Trace aspiration Penetration/Aspiration details (nectar cup): Material does not enter airway;Material enters airway, CONTACTS cords and not ejected out;Material enters airway, passes BELOW cords then ejected out Pharyngeal - Thin Pharyngeal - Thin Cup: Premature spillage to pyriform sinuses;Penetration/Aspiration before swallow;Trace aspiration Penetration/Aspiration details (thin cup): Material does not enter airway;Material enters airway, passes BELOW cords then ejected out;Material enters airway, passes BELOW cords and not ejected out despite cough attempt by patient;Material enters airway, CONTACTS cords and not ejected out Pharyngeal - Thin Straw: Premature spillage to pyriform sinuses;Penetration/Aspiration before swallow;Trace aspiration Penetration/Aspiration details (thin straw): Material enters airway, passes BELOW cords then ejected out Pharyngeal - Solids Pharyngeal - Puree: Within functional  limits Pharyngeal - Regular: Within functional limits Pharyngeal - Pill: Not tested  Cervical Esophageal Phase    GO    Cervical Esophageal Phase Cervical Esophageal Phase: [facility]        Herbie Baltimore, MA CCC-SLP [phone]  Lynann Beaver 04/18/2014, 10:02 AM

## 2014-04-22 ENCOUNTER — Telehealth: Payer: Self-pay | Admitting: Pharmacist

## 2014-04-22 ENCOUNTER — Telehealth: Payer: Self-pay | Admitting: Hematology & Oncology

## 2014-04-22 NOTE — ED Provider Notes (Signed)
I saw and evaluated the patient, reviewed the resident's note and I agree with the findings and plan.   EKG Interpretation   Date/Time:  Tuesday April 15 2014 18:33:20 EDT Ventricular Rate:  86 PR Interval:    QRS Duration: 126 QT Interval:  405 QTC Calculation: 484 R Axis:   -48 Text Interpretation:  Atrial fibrillation Nonspecific IVCD with LAD  Inferior infarct, old Confirmed by Jeneen Rinks  MD, [place] ([id number]) on 04/15/2014  6:35:21 PM      Pt seen with resident on arrival at bridge, and in room.  D/W neurology.  No indication for TPA.  Plan admit for TI A evaluation.  Exam without neurological loss.  Subjectively, pt with  Intermittent RLE numbness.  Tanna Furry, MD 04/22/14 [phone]

## 2014-04-22 NOTE — Telephone Encounter (Signed)
Received VM from patient's daughter, Gardiner Barefoot, stating that patient is currently in a SNF and will not be having her INR drawn in [facility] tomorrow. I cancelled [facility] appt. Left VM for Gardiner Barefoot requesting that she let us know if Coumadin is being managed by SNF or if we need to follow her. Also requested she let us know possible date.

## 2014-04-22 NOTE — Telephone Encounter (Signed)
Lattie Haw from [facility] called and cx patient's lab 04/23/14 apt due to patient being in rehab

## 2014-04-22 NOTE — Telephone Encounter (Signed)
Spoke with Dr. Alvy Bimler. While patient is admitted at SNF, SNF will manage patient's Coumadin. This is departmental policy per M.D.

## 2014-04-23 ENCOUNTER — Other Ambulatory Visit: Payer: Medicare Other | Admitting: Lab

## 2014-04-24 ENCOUNTER — Telehealth: Payer: Self-pay | Admitting: Pharmacist

## 2014-04-24 NOTE — Telephone Encounter (Signed)
Angelica [name] called to let us know that her mother's INR/coumadin is being followed by [facility] in [place]. She will call us to let us know when patient is discharged.

## 2014-05-08 ENCOUNTER — Other Ambulatory Visit: Payer: Self-pay | Admitting: Oncology

## 2014-05-15 ENCOUNTER — Telehealth: Payer: Self-pay | Admitting: Pharmacist

## 2014-05-15 NOTE — Telephone Encounter (Signed)
Called Angelica [name] to let her know that request for Angelica [name]'s records had been sent to [place]. She will hold Coumadin tonight and we will call her with instructions tomorrow after we receive the Coumadin dosing and INRs from her rehab stay.

## 2014-05-15 NOTE — Telephone Encounter (Signed)
Angelica [name]  [phone]) called regarding medical records for Angelica [name] recent stay in rehabilitation.   Angelica [name]'s daughter Angelica [name]) had asked them to send her records and they cannot do so without documentation from [place]. I faxed request to [phone] per their instructions. Spoke with Angelica [name] this morning.  Angelica [name] INR was 3.9 at discharge yesterday. She will skip her Coumadin dose and we will contact her with further instructions when her records are received.

## 2014-05-16 ENCOUNTER — Ambulatory Visit: Payer: Medicare Other | Admitting: Pharmacist

## 2014-05-16 DIAGNOSIS — I4821 Permanent atrial fibrillation: Secondary | ICD-10-CM

## 2014-05-16 DIAGNOSIS — I639 Cerebral infarction, unspecified: Secondary | ICD-10-CM

## 2014-05-16 LAB — POCT INR: INR: 1.6

## 2014-05-16 NOTE — Progress Notes (Signed)
INR below goal after holding coumadin x 2 days as instructed *Telephone encounter - No charge* Spoke to patient's daughter over the phone INR today from [place] was 1.6. Pt recently discharged from [facility] in [place] INR was managed there with doses ranging from 7-8 mg daily resulting in some supratherapeutic INRs No unusual bleeding or bruising No medication or diet changes Home health to start checking PT/INR's at pt home. Can call [phone] next Wednesday to set up next INR check (pt to have INR checked at MD visit next week) Plan: Take 7 mg x 1 then continue 6 mg/day.   Repeat INR on 05/21/14 - at PCP visit at [facility]. Results will be faxed to [place] and we will call with the results.

## 2014-05-16 NOTE — Patient Instructions (Signed)
INR below goal Take 7 mg x 1 then continue 6 mg/day.   Repeat INR on 05/21/14 - at PCP visit at [facility]. Results will be faxed to [place] and we will call with the results.

## 2014-05-22 ENCOUNTER — Telehealth: Payer: Self-pay | Admitting: Pharmacist

## 2014-05-22 LAB — POCT INR: INR: 1.7

## 2014-05-22 NOTE — Telephone Encounter (Signed)
Left VM for Lattie Haw in Medical Records at [facility] with Dr. Alyson Ingles. Asked to get INR results from 05/21/14.

## 2014-05-23 ENCOUNTER — Ambulatory Visit: Payer: Medicare Other | Admitting: Pharmacist

## 2014-05-23 DIAGNOSIS — I4821 Permanent atrial fibrillation: Secondary | ICD-10-CM

## 2014-05-23 DIAGNOSIS — I639 Cerebral infarction, unspecified: Secondary | ICD-10-CM

## 2014-05-23 NOTE — Progress Notes (Signed)
NO CHARGE - telephone encounter only  INR = 1.7    Goal 1.8-2.3 INR just below goal range. No complications noted. Called and spoke with patient's daughter, Gardiner Barefoot. Patient now has [facility] visiting weekly [phone]). Orders to draw PT/INR with weekly visits faxed to [phone]. She will take 7.5 mg x 1 tomorrow, then continue 6 mg/day. Repeat INR on 05/28/14 to be drawn by [facility]. Results will be faxed to [place] and we will call with the results.   Theone Murdoch, PharmD

## 2014-05-27 LAB — POCT INR: INR: 2.41

## 2014-05-28 ENCOUNTER — Ambulatory Visit: Payer: Medicare Other | Admitting: Pharmacist

## 2014-05-28 DIAGNOSIS — I4821 Permanent atrial fibrillation: Secondary | ICD-10-CM

## 2014-05-28 DIAGNOSIS — I639 Cerebral infarction, unspecified: Secondary | ICD-10-CM

## 2014-05-28 NOTE — Progress Notes (Signed)
INR = 2.41 Goal 1.8-2.3 INR just above goal range. Called and spoke with patient's daughter, Gardiner Barefoot. Pt took coumadin as instructed at last visit. No missed coumadin doses. No complications noted. No unusual bruising. No bleeding noted. No s/s of clotting noted. Pt not eating as much as usual per daughter's report. She is slowly rebounding from her stay in rehab. She is currently taking Mucinex. Continue 6 mg/day.   Repeat INR in 1 week on 06/04/14 to be drawn by [facility] [phone]) .   poke with Suanne Marker.  Results will be faxed to [place] and we will call [facility] with the results.  Spoke with [name] by telephone. No charge encounter.

## 2014-05-28 NOTE — Patient Instructions (Signed)
Continue 6 mg/day.  Repeat INR in 1 week on 06/04/14 to be drawn by [facility].   Spoke with Suanne Marker.  Results will be faxed to [place] and we will call [facility] with the results.

## 2014-06-04 LAB — POCT INR: INR: 2.1

## 2014-06-05 ENCOUNTER — Ambulatory Visit (INDEPENDENT_AMBULATORY_CARE_PROVIDER_SITE_OTHER): Payer: Self-pay | Admitting: Pharmacist

## 2014-06-05 ENCOUNTER — Telehealth: Payer: Self-pay | Admitting: Hematology & Oncology

## 2014-06-05 DIAGNOSIS — I482 Chronic atrial fibrillation: Secondary | ICD-10-CM

## 2014-06-05 DIAGNOSIS — I639 Cerebral infarction, unspecified: Secondary | ICD-10-CM

## 2014-06-05 DIAGNOSIS — I4821 Permanent atrial fibrillation: Secondary | ICD-10-CM

## 2014-06-05 NOTE — Progress Notes (Signed)
**  Telephone encounter-no charge** **Dr. Beryle Beams**  INR=2.1 drawn by [facility].  I spoke to Angelica Barefoot, [name] daughter on phone.  Ms Angelica [name] states that no changes in med and no bleeding bruising.  Ms Angelica [name] said that [name] will no longer be coming to home.  I have set up at appt at [facility] in 2 weeks.  If INR stable may increase to monthly INR checks.  Will continue current coumadin dose of 6mg  daily.

## 2014-06-05 NOTE — Telephone Encounter (Signed)
Melissa from [facility] called and sch lab apt for 06/19/14

## 2014-06-09 ENCOUNTER — Emergency Department (HOSPITAL_BASED_OUTPATIENT_CLINIC_OR_DEPARTMENT_OTHER): Payer: Medicare Other

## 2014-06-09 ENCOUNTER — Other Ambulatory Visit: Payer: Self-pay | Admitting: Emergency Medicine

## 2014-06-09 ENCOUNTER — Encounter (HOSPITAL_BASED_OUTPATIENT_CLINIC_OR_DEPARTMENT_OTHER): Payer: Self-pay | Admitting: Emergency Medicine

## 2014-06-09 ENCOUNTER — Emergency Department (HOSPITAL_BASED_OUTPATIENT_CLINIC_OR_DEPARTMENT_OTHER)
Admission: EM | Admit: 2014-06-09 | Discharge: 2014-06-09 | Disposition: A | Discharge status: Home / Self Care | Payer: Medicare Other | Attending: Emergency Medicine | Admitting: Emergency Medicine

## 2014-06-09 DIAGNOSIS — Z8739 Personal history of other diseases of the musculoskeletal system and connective tissue: Secondary | ICD-10-CM | POA: Insufficient documentation

## 2014-06-09 DIAGNOSIS — J449 Chronic obstructive pulmonary disease, unspecified: Secondary | ICD-10-CM | POA: Insufficient documentation

## 2014-06-09 DIAGNOSIS — Z8673 Personal history of transient ischemic attack (TIA), and cerebral infarction without residual deficits: Secondary | ICD-10-CM | POA: Diagnosis not present

## 2014-06-09 DIAGNOSIS — I4891 Unspecified atrial fibrillation: Secondary | ICD-10-CM | POA: Insufficient documentation

## 2014-06-09 DIAGNOSIS — Z8701 Personal history of pneumonia (recurrent): Secondary | ICD-10-CM | POA: Insufficient documentation

## 2014-06-09 DIAGNOSIS — Z8659 Personal history of other mental and behavioral disorders: Secondary | ICD-10-CM | POA: Diagnosis not present

## 2014-06-09 DIAGNOSIS — Z7901 Long term (current) use of anticoagulants: Secondary | ICD-10-CM | POA: Insufficient documentation

## 2014-06-09 DIAGNOSIS — D500 Iron deficiency anemia secondary to blood loss (chronic): Secondary | ICD-10-CM | POA: Diagnosis not present

## 2014-06-09 DIAGNOSIS — N3000 Acute cystitis without hematuria: Secondary | ICD-10-CM

## 2014-06-09 DIAGNOSIS — R350 Frequency of micturition: Secondary | ICD-10-CM | POA: Diagnosis present

## 2014-06-09 DIAGNOSIS — R109 Unspecified abdominal pain: Secondary | ICD-10-CM

## 2014-06-09 DIAGNOSIS — K219 Gastro-esophageal reflux disease without esophagitis: Secondary | ICD-10-CM | POA: Insufficient documentation

## 2014-06-09 DIAGNOSIS — I10 Essential (primary) hypertension: Secondary | ICD-10-CM | POA: Insufficient documentation

## 2014-06-09 DIAGNOSIS — Z87738 Personal history of other specified (corrected) congenital malformations of digestive system: Secondary | ICD-10-CM | POA: Diagnosis not present

## 2014-06-09 DIAGNOSIS — I509 Heart failure, unspecified: Secondary | ICD-10-CM | POA: Insufficient documentation

## 2014-06-09 DIAGNOSIS — Z79899 Other long term (current) drug therapy: Secondary | ICD-10-CM | POA: Insufficient documentation

## 2014-06-09 DIAGNOSIS — Z880 Allergy status to penicillin: Secondary | ICD-10-CM | POA: Diagnosis not present

## 2014-06-09 LAB — CBC WITH DIFFERENTIAL/PLATELET
BASOS PCT: 0 % (ref 0–1)
Basophils Absolute: 0 10*3/uL (ref 0.0–0.1)
Eosinophils Absolute: 0.4 10*3/uL (ref 0.0–0.7)
Eosinophils Relative: 6 % — ABNORMAL HIGH (ref 0–5)
HEMATOCRIT: 29.9 % — AB (ref 36.0–46.0)
Hemoglobin: 9.1 g/dL — ABNORMAL LOW (ref 12.0–15.0)
LYMPHS ABS: 0.5 10*3/uL — AB (ref 0.7–4.0)
LYMPHS PCT: 7 % — AB (ref 12–46)
MCH: 27.2 pg (ref 26.0–34.0)
MCHC: 30.4 g/dL (ref 30.0–36.0)
MCV: 89.5 fL (ref 78.0–100.0)
MONO ABS: 0.8 10*3/uL (ref 0.1–1.0)
MONOS PCT: 11 % (ref 3–12)
NEUTROS ABS: 5.5 10*3/uL (ref 1.7–7.7)
NEUTROS PCT: 76 % (ref 43–77)
Platelets: 423 10*3/uL — ABNORMAL HIGH (ref 150–400)
RBC: 3.34 MIL/uL — AB (ref 3.87–5.11)
RDW: 16.8 % — ABNORMAL HIGH (ref 11.5–15.5)
WBC: 7.2 10*3/uL (ref 4.0–10.5)

## 2014-06-09 LAB — URINE MICROSCOPIC-ADD ON

## 2014-06-09 LAB — COMPREHENSIVE METABOLIC PANEL
ALT: 10 U/L (ref 0–35)
ANION GAP: 10 (ref 5–15)
AST: 14 U/L (ref 0–37)
Albumin: 2.3 g/dL — ABNORMAL LOW (ref 3.5–5.2)
Alkaline Phosphatase: 97 U/L (ref 39–117)
BUN: 11 mg/dL (ref 6–23)
CALCIUM: 8.8 mg/dL (ref 8.4–10.5)
CO2: 30 mEq/L (ref 19–32)
CREATININE: 0.9 mg/dL (ref 0.50–1.10)
Chloride: 101 mEq/L (ref 96–112)
GFR calc non Af Amer: 55 mL/min — ABNORMAL LOW (ref >90)
GFR, EST AFRICAN AMERICAN: 64 mL/min — AB (ref >90)
GLUCOSE: 106 mg/dL — AB (ref 70–99)
POTASSIUM: 3.3 meq/L — AB (ref 3.7–5.3)
Sodium: 141 mEq/L (ref 137–147)
Total Bilirubin: <0.2 mg/dL — ABNORMAL LOW (ref 0.3–1.2)
Total Protein: 5.1 g/dL — ABNORMAL LOW (ref 6.0–8.3)

## 2014-06-09 LAB — URINALYSIS, ROUTINE W REFLEX MICROSCOPIC
Bilirubin Urine: NEGATIVE
Glucose, UA: NEGATIVE mg/dL
Ketones, ur: NEGATIVE mg/dL
Nitrite: NEGATIVE
PH: 6 (ref 5.0–8.0)
Protein, ur: 30 mg/dL — AB
SPECIFIC GRAVITY, URINE: 1.02 (ref 1.005–1.030)
Urobilinogen, UA: 0.2 mg/dL (ref 0.0–1.0)

## 2014-06-09 MED ORDER — CIPROFLOXACIN HCL 500 MG PO TABS: 500.0000 mg | ORAL_TABLET | Freq: Two times a day (BID) | ORAL | Status: DC

## 2014-06-09 NOTE — Discharge Instructions (Signed)
Cipro as prescribed.  Return to the emergency department if you develop severe abdominal pain, high fever, or other new and concerning symptoms.   Urinary Tract Infection Urinary tract infections (UTIs) can develop anywhere along your urinary tract. Your urinary tract is your body's drainage system for removing wastes and extra water. Your urinary tract includes two kidneys, two ureters, a bladder, and a urethra. Your kidneys are a pair of bean-shaped organs. Each kidney is about the size of your fist. They are located below your ribs, one on each side of your spine. CAUSES Infections are caused by microbes, which are microscopic organisms, including fungi, viruses, and bacteria. These organisms are so small that they can only be seen through a microscope. Bacteria are the microbes that most commonly cause UTIs. SYMPTOMS  Symptoms of UTIs may vary by age and gender of the patient and by the location of the infection. Symptoms in young women typically include a frequent and intense urge to urinate and a painful, burning feeling in the bladder or urethra during urination. Older women and men are more likely to be tired, shaky, and weak and have muscle aches and abdominal pain. A fever may mean the infection is in your kidneys. Other symptoms of a kidney infection include pain in your back or sides below the ribs, nausea, and vomiting. DIAGNOSIS To diagnose a UTI, your caregiver will ask you about your symptoms. Your caregiver also will ask to provide a urine sample. The urine sample will be tested for bacteria and white blood cells. White blood cells are made by your body to help fight infection. TREATMENT  Typically, UTIs can be treated with medication. Because most UTIs are caused by a bacterial infection, they usually can be treated with the use of antibiotics. The choice of antibiotic and length of treatment depend on your symptoms and the type of bacteria causing your infection. HOME CARE  INSTRUCTIONS  If you were prescribed antibiotics, take them exactly as your caregiver instructs you. Finish the medication even if you feel better after you have only taken some of the medication.  Drink enough water and fluids to keep your urine clear or pale yellow.  Avoid caffeine, tea, and carbonated beverages. They tend to irritate your bladder.  Empty your bladder often. Avoid holding urine for long periods of time.  Empty your bladder before and after sexual intercourse.  After a bowel movement, women should cleanse from front to back. Use each tissue only once. SEEK MEDICAL CARE IF:   You have back pain.  You develop a fever.  Your symptoms do not begin to resolve within 3 days. SEEK IMMEDIATE MEDICAL CARE IF:   You have severe back pain or lower abdominal pain.  You develop chills.  You have nausea or vomiting.  You have continued burning or discomfort with urination. MAKE SURE YOU:   Understand these instructions.  Will watch your condition.  Will get help right away if you are not doing well or get worse. Document Released: 03/30/2005 Document Revised: 12/20/2011 Document Reviewed: 07/29/2011 [facility] Patient Information 2015 [place], [place]. This information is not intended to replace advice given to you by your health care provider. Make sure you discuss any questions you have with your health care provider.

## 2014-06-09 NOTE — ED Provider Notes (Signed)
CSN: [id number]     Arrival date & time 06/09/14  1129 History   First MD Initiated Contact with Patient 06/09/14 1141     Chief Complaint  Patient presents with  . Urinary Frequency     (Consider location/radiation/quality/duration/timing/severity/associated sxs/prior Treatment) HPI Comments: Patient is a 80 year old female with history of atrial fibrillation, hypertension, COPD, dementia. She presents today with complaints of urinary frequency and lower abdominal discomfort that has occurred intermittently for the past 2 days. She denies any bowel complaints. She denies any fever or chills.  Patient is a 80 y.o. female presenting with frequency. The history is provided by the patient.  Urinary Frequency This is a new problem. The current episode started 2 days ago. Episode frequency: Intermittently. The problem has been gradually worsening. Associated symptoms include abdominal pain. Pertinent negatives include no chest pain. Nothing aggravates the symptoms. Nothing relieves the symptoms. She has tried nothing for the symptoms. The treatment provided no relief.    Past Medical History  Diagnosis Date  . Atrial fibrillation   . Hypertension   . Arthritis   . GERD (gastroesophageal reflux disease)   . COPD (chronic obstructive pulmonary disease)   . PNA (pneumonia)   . Anemia due to GI blood loss 07/11/2011  . 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 Stroke 2009    aphasia and memory impairment as residual  . Hypoxia   . Atrial fibrillation, permanent, rate controlled on coumadin 05/10/2011  . Hypogammaglobulinemia 06/01/2012    Immunoglobulins 05/01/2012-normal IgG M., normal IgA,  low IgG [phone]).   . 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 CVA history 05/10/2011  . CHF (congestive heart failure) 29/51/8841    diastolic  . History of nuclear stress test 2009    normal dobutamine myoview    Past Surgical History  Procedure Laterality Date  . Hip fracture surgery    . Cholecystectomy    . Knee surgery    . Cataract extraction    . Transthoracic echocardiogram  06/2011    EF 55-60%; mild LVH; LA & RA mod dilated; mod TR   Family History  Problem Relation Age of Onset  . Heart disease Mother   . Rectal cancer Mother   . Leukemia Father   . Heart failure Mother   . Stroke Mother   . Cancer Maternal Grandmother   . Hypertension Son   . Hypertension Daughter    History  Substance Use Topics  . Smoking status: Never Smoker   . Smokeless tobacco: Never Used  . Alcohol Use: No   OB History    No data available     Review of Systems  Cardiovascular: Negative for chest pain.  Gastrointestinal: Positive for abdominal pain.  Genitourinary: Positive for frequency.  All other systems reviewed and are negative.     Allergies  Amoxicillin and Penicillins  Home Medications   Prior to Admission medications   Medication Sig Start Date End Date Taking? Authorizing Provider  diltiazem (DILACOR XR) 180 MG 24 hr capsule Take 180 mg by mouth daily before breakfast.     Historical Provider, MD  ferrous sulfate 325 (65 FE) MG tablet Take 325 mg by mouth 2 (two) times daily with a meal.     Historical Provider, MD  furosemide (LASIX) 80 MG tablet Take 80 mg by mouth daily.     Historical Provider, MD  loratadine (ALLERGY) 10 MG tablet Take 10 mg by mouth  daily.    Historical Provider, MD  Multiple Vitamins-Minerals (MULTIVITAMINS THER. W/MINERALS) TABS Take 1 tablet by mouth every evening.     Historical Provider, MD  pantoprazole (PROTONIX) 40 MG tablet Take 40 mg by mouth daily.    Historical Provider, MD  potassium chloride (K-DUR) 10 MEQ tablet Take 2 tablets (20 mEq total) by mouth daily. 04/18/14   Kelvin Cellar, MD  predniSONE (DELTASONE) 5 MG tablet Take 10 mg by mouth every other day.     Historical Provider, MD  senna-docusate (SENOKOT-S) 8.6-50 MG per tablet  Take 1 tablet by mouth at bedtime as needed for mild constipation. 04/18/14   Kelvin Cellar, MD  simvastatin (ZOCOR) 10 MG tablet Take 10 mg by mouth every morning.     Historical Provider, MD  warfarin (COUMADIN) 4 MG tablet TAKE 6 MG BY MOUTH EVERY DAY AS DIRECTED BY DOCTOR (160 TABS=90 DAY SUPPLY) 05/08/14   Annia Belt, MD   BP 117/66 mmHg  Pulse 74  Temp(Src) 97.9 F (36.6 C) (Oral)  Resp 18  Ht 5\' 4"  (1.626 m)  Wt 134 lb (60.782 kg)  BMI 22.99 kg/m2  SpO2 97% Physical Exam  Constitutional: She is oriented to person, place, and time. She appears well-developed and well-nourished. No distress.  HENT:  Head: Normocephalic and atraumatic.  Neck: Normal range of motion. Neck supple.  Cardiovascular: Normal rate and regular rhythm.  Exam reveals no gallop and no friction rub.   No murmur heard. Pulmonary/Chest: Effort normal and breath sounds normal. No respiratory distress. She has no wheezes.  Abdominal: Soft. Bowel sounds are normal. She exhibits no distension. There is no tenderness.  Musculoskeletal: Normal range of motion.  Neurological: She is alert and oriented to person, place, and time.  Skin: Skin is warm and dry. She is not diaphoretic.  Nursing note and vitals reviewed.   ED Course  Procedures (including critical care time) Labs Review Labs Reviewed  COMPREHENSIVE METABOLIC PANEL  CBC WITH DIFFERENTIAL  URINALYSIS, ROUTINE W REFLEX MICROSCOPIC    Imaging Review No results found.   EKG Interpretation None      MDM   Final diagnoses:  Abdominal pain    Patient presents with complaints of urinary frequency and lower abdominal discomfort. Patient and daughter are concerned she may have a urinary tract infection. Workup was performed and reveals moderate leukocytes on a urine dip and 21-50 white cells on the micro. She will be discharged home with Cipro and when necessary return. Her abdomen is quite benign and I doubt any significant pathology. She  understands to return if her symptoms worsen or change.    Veryl Speak, MD 06/09/14 1357

## 2014-06-09 NOTE — ED Notes (Signed)
Daughter states that she thinks pt has UTI, pt called Friday and stated she had the runs, daughter went to check on her and realized it was urine problems.  Pt states she is in no pain.

## 2014-06-12 LAB — URINE CULTURE: Colony Count: >=100000

## 2014-06-13 ENCOUNTER — Telehealth: Payer: Self-pay | Admitting: Emergency Medicine

## 2014-06-13 NOTE — Telephone Encounter (Signed)
Post ED Visit - Positive Culture Follow-up  Culture report reviewed by antimicrobial stewardship pharmacist: []  Wes [place], Pharm.D., BCPS [x]  Heide Guile, Pharm.D., BCPS []  Alycia Rossetti, Pharm.D., BCPS []  [place], Pharm.D., BCPS, AAHIVP []  Legrand Como, Pharm.D., BCPS, AAHIVP []  Isac Sarna, Pharm.D., BCPS  Positive Urine culture Treated with Cipro, organism sensitive to the same and no further patient follow-up is required at this time.  Ernesta Amble 06/13/2014, 12:49 PM

## 2014-06-19 ENCOUNTER — Other Ambulatory Visit (HOSPITAL_BASED_OUTPATIENT_CLINIC_OR_DEPARTMENT_OTHER): Payer: Medicare Other | Admitting: Lab

## 2014-06-19 ENCOUNTER — Ambulatory Visit: Payer: Medicare Other | Admitting: Pharmacist

## 2014-06-19 DIAGNOSIS — I639 Cerebral infarction, unspecified: Secondary | ICD-10-CM

## 2014-06-19 DIAGNOSIS — Z7901 Long term (current) use of anticoagulants: Secondary | ICD-10-CM

## 2014-06-19 DIAGNOSIS — I4821 Permanent atrial fibrillation: Secondary | ICD-10-CM

## 2014-06-19 DIAGNOSIS — D500 Iron deficiency anemia secondary to blood loss (chronic): Secondary | ICD-10-CM

## 2014-06-19 LAB — FERRITIN CHCC: Ferritin: 135 ng/ml (ref 9–269)

## 2014-06-19 LAB — IRON AND TIBC CHCC
%SAT: 11 % — AB (ref 21–57)
IRON: 29 ug/dL — AB (ref 41–142)
TIBC: 271 ug/dL (ref 236–444)
UIBC: 243 ug/dL (ref 120–384)

## 2014-06-19 LAB — CBC WITH DIFFERENTIAL (CANCER CENTER ONLY)
BASO#: 0 10*3/uL (ref 0.0–0.2)
BASO%: 0.3 % (ref 0.0–2.0)
EOS%: 3.6 % (ref 0.0–7.0)
Eosinophils Absolute: 0.3 10*3/uL (ref 0.0–0.5)
HEMATOCRIT: 35.3 % (ref 34.8–46.6)
HEMOGLOBIN: 10.8 g/dL — AB (ref 11.6–15.9)
LYMPH#: 1 10*3/uL (ref 0.9–3.3)
LYMPH%: 13.6 % — ABNORMAL LOW (ref 14.0–48.0)
MCH: 27 pg (ref 26.0–34.0)
MCHC: 30.6 g/dL — AB (ref 32.0–36.0)
MCV: 88 fL (ref 81–101)
MONO#: 1 10*3/uL — ABNORMAL HIGH (ref 0.1–0.9)
MONO%: 13.8 % — AB (ref 0.0–13.0)
NEUT#: 5.2 10*3/uL (ref 1.5–6.5)
NEUT%: 68.7 % (ref 39.6–80.0)
Platelets: 445 10*3/uL — ABNORMAL HIGH (ref 145–400)
RBC: 4 10*6/uL (ref 3.70–5.32)
RDW: 16.1 % — ABNORMAL HIGH (ref 11.1–15.7)
WBC: 7.5 10*3/uL (ref 3.9–10.0)

## 2014-06-19 LAB — RETICULOCYTES (CHCC)
ABS Retic: 164.8 10*3/uL (ref 19.0–186.0)
RBC.: 4.02 MIL/uL (ref 3.87–5.11)
Retic Ct Pct: 4.1 % — ABNORMAL HIGH (ref 0.4–2.3)

## 2014-06-19 LAB — POCT INR: INR: 1.96

## 2014-06-19 LAB — PROTHROMBIN TIME
INR: 1.96 — ABNORMAL HIGH (ref <1.50)
Prothrombin Time: 22.3 seconds — ABNORMAL HIGH (ref 11.6–15.2)

## 2014-06-19 NOTE — Progress Notes (Signed)
INR at goal *Telephone encounter - No charge* Pt is doing relatively well with no complaints besides fatigue Spoke to patient's daughter on the phone No unusual bleeding or bruising No missed or extra doses Pt did complete a 7 day course of cipro last week but any effects on INR appear to be over.  Pt has appointment with Dr. Alvy Bimler next month in the new year Plan: No Changes Continue 6 mg/day.  Repeat INR in 4 week on 07/22/13 at [facility] with appointment with Dr. Alvy Bimler.

## 2014-06-19 NOTE — Patient Instructions (Signed)
INR at goal No changes Continue 6 mg/day.   Repeat INR in 4 week on 07/22/13 at [facility] with appointment with Dr. Alvy Bimler.

## 2014-07-22 ENCOUNTER — Ambulatory Visit (HOSPITAL_BASED_OUTPATIENT_CLINIC_OR_DEPARTMENT_OTHER): Payer: Medicare Other | Admitting: Hematology and Oncology

## 2014-07-22 ENCOUNTER — Ambulatory Visit: Payer: Medicare Other | Admitting: Pharmacist

## 2014-07-22 ENCOUNTER — Encounter: Payer: Self-pay | Admitting: Hematology and Oncology

## 2014-07-22 ENCOUNTER — Other Ambulatory Visit (HOSPITAL_BASED_OUTPATIENT_CLINIC_OR_DEPARTMENT_OTHER): Payer: Medicare Other

## 2014-07-22 ENCOUNTER — Telehealth: Payer: Self-pay | Admitting: Hematology and Oncology

## 2014-07-22 VITALS — BP 135/70 | HR 95 | Temp 98.0°F | Resp 18 | Ht 64.0 in | Wt 131.6 lb

## 2014-07-22 DIAGNOSIS — I4821 Permanent atrial fibrillation: Secondary | ICD-10-CM

## 2014-07-22 DIAGNOSIS — I639 Cerebral infarction, unspecified: Secondary | ICD-10-CM

## 2014-07-22 DIAGNOSIS — D500 Iron deficiency anemia secondary to blood loss (chronic): Secondary | ICD-10-CM

## 2014-07-22 DIAGNOSIS — Z7901 Long term (current) use of anticoagulants: Secondary | ICD-10-CM

## 2014-07-22 DIAGNOSIS — I482 Chronic atrial fibrillation: Secondary | ICD-10-CM

## 2014-07-22 LAB — CBC & DIFF AND RETIC
BASO%: 0.3 % (ref 0.0–2.0)
Basophils Absolute: 0 10*3/uL (ref 0.0–0.1)
EOS%: 5.7 % (ref 0.0–7.0)
Eosinophils Absolute: 0.4 10*3/uL (ref 0.0–0.5)
HCT: 33 % — ABNORMAL LOW (ref 34.8–46.6)
HEMOGLOBIN: 10.2 g/dL — AB (ref 11.6–15.9)
IMMATURE RETIC FRACT: 13.2 % — AB (ref 1.60–10.00)
LYMPH#: 0.8 10*3/uL — AB (ref 0.9–3.3)
LYMPH%: 11.3 % — ABNORMAL LOW (ref 14.0–49.7)
MCH: 26.8 pg (ref 25.1–34.0)
MCHC: 30.9 g/dL — AB (ref 31.5–36.0)
MCV: 86.8 fL (ref 79.5–101.0)
MONO#: 0.8 10*3/uL (ref 0.1–0.9)
MONO%: 10.7 % (ref 0.0–14.0)
NEUT#: 5.2 10*3/uL (ref 1.5–6.5)
NEUT%: 72 % (ref 38.4–76.8)
Platelets: 442 10*3/uL — ABNORMAL HIGH (ref 145–400)
RBC: 3.8 10*6/uL (ref 3.70–5.45)
RDW: 16 % — ABNORMAL HIGH (ref 11.2–14.5)
RETIC CT ABS: 184.68 10*3/uL — AB (ref 33.70–90.70)
Retic %: 4.86 % — ABNORMAL HIGH (ref 0.70–2.10)
WBC: 7.2 10*3/uL (ref 3.9–10.3)
nRBC: 0 % (ref 0–0)

## 2014-07-22 LAB — PROTIME-INR
INR: 4.4 — ABNORMAL HIGH (ref 2.00–3.50)
PROTIME: 52.8 s — AB (ref 10.6–13.4)

## 2014-07-22 LAB — POCT INR: INR: 4.4

## 2014-07-22 NOTE — Progress Notes (Signed)
INR above goal today at 4.4 (goal 1.8-2.3) Spoke to Patient's daughter [name]) over the phone Pt is doing well with no bleeding issues Pt has lost ~ 10lbs in the last 3 months (4lb weight lose in last 6 weeks) This is the only change Diet and medications have remained stable No other changes or complications Plan: Hold coumadin x 2 days then change coumadin dose  to 6 mg alternating with 4 mg every other day.   Repeat INR in 1 week on 07/30/13 at [facility]. We will call you with the results

## 2014-07-22 NOTE — Progress Notes (Signed)
[place] OFFICE PROGRESS NOTE  [name], MD  CHIEF COMPLAINTS/PURPOSE OF VISIT:  Iron deficiency anemia from chronic GI bleed, arteriovenous malformation and nature fibrillation with history of stroke  HISTORY OF PRESENTING ILLNESS:  Angelica [name] was transferred to my care after her prior physician has left.  I reviewed the patient's records extensive and collaborated the history with the patient. Summary of her history is as follows: This is a pleasant lady with permanent atrial fibrillation, AV malformation and diastolic heart failure.  She is on full dose Coumadin anticoagulation due to a previous stroke. It was felt aspirin was contraindicated in view of her AVMs and GI bleeding. Her Coumadin is monitored through our office. She requires periodic parenteral iron infusions. Most recent infusion given on 12/20/2012. She has had a number of medical issues over the last 2 years. She was hospitalized one year ago in December 2012 with pneumonia and congestive heart failure. She was hospitalized in April 2013 with viral gastroenteritis/diarrhea. Hemoglobin dipped from her baseline of 11 g at that time and she was given parenteral iron. She was admitted in April 2014 with dyspnea and left flank pain. Evaluation revealed she was in heart failure. She had a subsequent fall and fracture of her right arm. She has chronic rhinitis and uses a Proventil nebulizer and Astelin nasal spray as well as Pulmicort nebulizers. She is on chronic low-dose prednisone 5 mg daily. She has mild chronic hematuria and mild bleeding from the stool. She tolerated oral iron supplement twice a day. INTERVAL HISTORY: Angelica [name] 81 y.o. female returns for further follow-up, accompanied by her daughter. She continues to have intermittent GI bleed. She is not symptomatic. She tolerated iron supplement well.  I have reviewed the past medical history, past surgical history, social history  and family history with the patient and they are unchanged from previous note.  ALLERGIES:  is allergic to amoxicillin and penicillins.  MEDICATIONS:  Current Outpatient Prescriptions  Medication Sig Dispense Refill  . albuterol (PROVENTIL) (2.5 MG/3ML) 0.083% nebulizer solution   5  . budesonide (PULMICORT) 0.5 MG/2ML nebulizer solution   5  . diltiazem (DILACOR XR) 180 MG 24 hr capsule Take 180 mg by mouth daily before breakfast.     . ferrous sulfate 325 (65 FE) MG tablet Take 325 mg by mouth 2 (two) times daily with a meal.     . furosemide (LASIX) 80 MG tablet Take 80 mg by mouth daily.     Marland Kitchen loratadine (ALLERGY) 10 MG tablet Take 10 mg by mouth daily.    . Multiple Vitamins-Minerals (MULTIVITAMINS THER. W/MINERALS) TABS Take 1 tablet by mouth every evening.     . pantoprazole (PROTONIX) 40 MG tablet Take 40 mg by mouth daily.    . potassium chloride (K-DUR) 10 MEQ tablet Take 2 tablets (20 mEq total) by mouth daily. 30 tablet 0  . predniSONE (DELTASONE) 5 MG tablet Take 10 mg by mouth every other day.     . warfarin (COUMADIN) 4 MG tablet TAKE 6 MG BY MOUTH EVERY DAY AS DIRECTED BY DOCTOR (160 TABS=90 DAY SUPPLY) 160 tablet 2   No current facility-administered medications for this visit.     REVIEW OF SYSTEMS:   Constitutional: Denies fevers, chills or night sweats Eyes: Denies blurriness of vision Ears, nose, mouth, throat, and face: Denies mucositis or sore throat Respiratory: Denies cough, dyspnea or wheezes Cardiovascular: Denies palpitation, chest discomfort or lower extremity swelling Gastrointestinal:  Denies nausea, heartburn or change in  bowel habits Skin: Denies abnormal skin rashes Lymphatics: Denies new lymphadenopathy or easy bruising Neurological:Denies numbness, tingling or new weaknesses Behavioral/Psych: Mood is stable, no new changes  All other systems were reviewed with the patient and are negative.  PHYSICAL EXAMINATION: ECOG PERFORMANCE STATUS: 2 -  Symptomatic, <50% confined to bed  Filed Vitals:   07/22/14 1118  BP: 135/70  Pulse: 95  Temp: 98 F (36.7 C)  Resp: 18   Filed Weights   07/22/14 1118  Weight: 131 lb 9.6 oz (59.693 kg)    GENERAL:alert, no distress and comfortable. She looks elderly SKIN: skin color, texture, turgor are normal, no rashes or significant lesions EYES: normal, Conjunctiva are pink and non-injected, sclera clear Musculoskeletal:no cyanosis of digits and no clubbing  NEURO: alert & oriented x 3 with fluent speech, no focal motor/sensory deficits  LABORATORY DATA:  I have reviewed the data as listed Results for orders placed or performed in visit on 07/22/14 (from the past 48 hour(s))  CBC & Diff and Retic     Status: Abnormal   Collection Time: 07/22/14 11:01 AM  Result Value Ref Range   WBC 7.2 3.9 - 10.3 10e3/uL   NEUT# 5.2 1.5 - 6.5 10e3/uL   HGB 10.2 (L) 11.6 - 15.9 g/dL   HCT 33.0 (L) 34.8 - 46.6 %   Platelets 442 (H) 145 - 400 10e3/uL   MCV 86.8 79.5 - 101.0 fL   MCH 26.8 25.1 - 34.0 pg   MCHC 30.9 (L) 31.5 - 36.0 g/dL   RBC 3.80 3.70 - 5.45 10e6/uL   RDW 16.0 (H) 11.2 - 14.5 %   lymph# 0.8 (L) 0.9 - 3.3 10e3/uL   MONO# 0.8 0.1 - 0.9 10e3/uL   Eosinophils Absolute 0.4 0.0 - 0.5 10e3/uL   Basophils Absolute 0.0 0.0 - 0.1 10e3/uL   NEUT% 72.0 38.4 - 76.8 %   LYMPH% 11.3 (L) 14.0 - 49.7 %   MONO% 10.7 0.0 - 14.0 %   EOS% 5.7 0.0 - 7.0 %   BASO% 0.3 0.0 - 2.0 %   nRBC 0 0 - 0 %   Retic % 4.86 (H) 0.70 - 2.10 %   Retic Ct Abs 184.68 (H) 33.70 - 90.70 10e3/uL   Immature Retic Fract 13.20 (H) 1.60 - 10.00 %  Protime-INR     Status: Abnormal   Collection Time: 07/22/14 11:01 AM  Result Value Ref Range   Protime 52.8 (H) 10.6 - 13.4 Seconds   INR 4.40 (H) 2.00 - 3.50    Comment: INR is useful only to assess adequacy of anticoagulation with coumadin when comparing results from different labs. It should not be used to estimate bleeding risk or presence/abscense of coagulopathy in  patients not on coumadin. Expected INR ranges for  nontherapeutic patients is 0.88 - 1.12.    Lovenox No     Lab Results  Component Value Date   WBC 7.2 07/22/2014   HGB 10.2* 07/22/2014   HCT 33.0* 07/22/2014   MCV 86.8 07/22/2014   PLT 442* 07/22/2014    ASSESSMENT & PLAN:  Anemia due to GI blood loss This is likely anemia of chronic disease. The patient denies recent history of bleeding such as epistaxis, hematuria or hematochezia. She is asymptomatic from the anemia. We will observe for now.  She does not require transfusion now.  She will continue oral iron supplements twice a day indefinitely.      Atrial fibrillation, permanent, rate controlled on coumadin Her INR is  mildly supraherapeutic. She has chronic intermittent GI bleed. She will continue followup at the Coumadin clinic here. The goal of anticoagulation therapy is 2-3, duration of treatment for life.      All questions were answered. The patient knows to call the clinic with any problems, questions or concerns. No barriers to learning was detected.  I spent 15 minutes counseling the patient face to face. The total time spent in the appointment was 20 minutes and more than 50% was on counseling.     Alvy Bimler, Bev Drennen, MD 1/19/20164:10 PM

## 2014-07-22 NOTE — Assessment & Plan Note (Signed)
Her INR is mildly supraherapeutic. She has chronic intermittent GI bleed. She will continue followup at the Coumadin clinic here. The goal of anticoagulation therapy is 2-3, duration of treatment for life.

## 2014-07-22 NOTE — Telephone Encounter (Signed)
Gave avs & calendar for July °

## 2014-07-22 NOTE — Patient Instructions (Signed)
Hold coumadin x 2 days then change coumadin dose  to 6 mg alternating with 4 mg every other day.   Repeat INR in 1 week on 07/30/13 at [facility]. We will call you with the results

## 2014-07-22 NOTE — Assessment & Plan Note (Signed)
This is likely anemia of chronic disease. The patient denies recent history of bleeding such as epistaxis, hematuria or hematochezia. She is asymptomatic from the anemia. We will observe for now.  She does not require transfusion now.  She will continue oral iron supplements twice a day indefinitely.

## 2014-07-23 ENCOUNTER — Telehealth: Payer: Self-pay | Admitting: *Deleted

## 2014-07-23 NOTE — Telephone Encounter (Signed)
Daughter called with additional medications pt is taking that she did have names of at visit.  Obtained and updated.

## 2014-07-28 ENCOUNTER — Telehealth: Payer: Self-pay | Admitting: Hematology & Oncology

## 2014-07-28 ENCOUNTER — Telehealth: Payer: Self-pay | Admitting: Pharmacist

## 2014-07-28 NOTE — Telephone Encounter (Signed)
Ginna from [facility] called and cx 07/30/14 and resch for 07/31/14

## 2014-07-28 NOTE — Telephone Encounter (Signed)
Gardiner Barefoot called & cannot take her mother to [facility] for lab on Wed.  She can take her in on Thurs AM.  Appt switched. Kennith Center, Pharm.D., CPP 07/28/2014@10 :09 AM

## 2014-07-30 ENCOUNTER — Other Ambulatory Visit: Payer: Medicare Other | Admitting: Lab

## 2014-07-31 ENCOUNTER — Ambulatory Visit: Payer: Medicare Other | Admitting: Pharmacist

## 2014-07-31 ENCOUNTER — Other Ambulatory Visit (HOSPITAL_BASED_OUTPATIENT_CLINIC_OR_DEPARTMENT_OTHER): Payer: Medicare Other | Admitting: Lab

## 2014-07-31 DIAGNOSIS — D500 Iron deficiency anemia secondary to blood loss (chronic): Secondary | ICD-10-CM

## 2014-07-31 DIAGNOSIS — I482 Chronic atrial fibrillation: Secondary | ICD-10-CM

## 2014-07-31 DIAGNOSIS — Z7901 Long term (current) use of anticoagulants: Secondary | ICD-10-CM

## 2014-07-31 DIAGNOSIS — I639 Cerebral infarction, unspecified: Secondary | ICD-10-CM

## 2014-07-31 DIAGNOSIS — I4821 Permanent atrial fibrillation: Secondary | ICD-10-CM

## 2014-07-31 LAB — PROTIME-INR (CHCC SATELLITE)
INR: 1.1 — ABNORMAL LOW (ref 2.0–3.5)
Protime: 13.2 Seconds (ref 10.6–13.4)

## 2014-07-31 LAB — CBC WITH DIFFERENTIAL (CANCER CENTER ONLY)
BASO#: 0 10*3/uL (ref 0.0–0.2)
BASO%: 0.1 % (ref 0.0–2.0)
EOS ABS: 0.5 10*3/uL (ref 0.0–0.5)
EOS%: 5.8 % (ref 0.0–7.0)
HEMATOCRIT: 35 % (ref 34.8–46.6)
HGB: 10.9 g/dL — ABNORMAL LOW (ref 11.6–15.9)
LYMPH#: 0.7 10*3/uL — ABNORMAL LOW (ref 0.9–3.3)
LYMPH%: 7.7 % — AB (ref 14.0–48.0)
MCH: 27 pg (ref 26.0–34.0)
MCHC: 31.1 g/dL — ABNORMAL LOW (ref 32.0–36.0)
MCV: 87 fL (ref 81–101)
MONO#: 0.8 10*3/uL (ref 0.1–0.9)
MONO%: 9 % (ref 0.0–13.0)
NEUT#: 6.6 10*3/uL — ABNORMAL HIGH (ref 1.5–6.5)
NEUT%: 77.4 % (ref 39.6–80.0)
Platelets: 449 10*3/uL — ABNORMAL HIGH (ref 145–400)
RBC: 4.03 10*6/uL (ref 3.70–5.32)
RDW: 15.3 % (ref 11.1–15.7)
WBC: 8.5 10*3/uL (ref 3.9–10.0)

## 2014-07-31 LAB — IRON AND TIBC CHCC
%SAT: 15 % — AB (ref 21–57)
IRON: 30 ug/dL — AB (ref 41–142)
TIBC: 203 ug/dL — ABNORMAL LOW (ref 236–444)
UIBC: 173 ug/dL (ref 120–384)

## 2014-07-31 LAB — FERRITIN CHCC: Ferritin: 150 ng/ml (ref 9–269)

## 2014-07-31 LAB — POCT INR: INR: 1.1

## 2014-07-31 NOTE — Patient Instructions (Signed)
INR below goal increase coumadin dose  to 6 mg daily except for 4 mg Wednesdays and Saturday.   Repeat INR in 2 week on 08/12/14 at [facility] at 11am. We will call you with the results

## 2014-07-31 NOTE — Progress Notes (Signed)
INR below goal *Telephone Encounter - No charge* Spoke to Gardiner Barefoot, patient's daughter over the phone. INR now 1.1 after supratherapeutic INR last week Pt held x 2 days last week as instructed and took coumadin 4 mg  alternating with 6 mg every other day Unsure why such a drastic decrease in INR No unusual bleeding or bruising No missed or extra doses No diet or medication changes Due to bleed risk will be conservative with dose increase (pt previously stable on 6 mg daily and if INR below goal at next visit can return to 6 mg daily) For now Plan: Increase coumadin dose  to 6 mg daily except for 4 mg Wednesdays and Saturday.   Repeat INR in 2 week on 08/12/14 at [facility] at 11am. We will call you with the results

## 2014-08-12 ENCOUNTER — Other Ambulatory Visit: Payer: Medicare Other | Admitting: Lab

## 2014-08-19 ENCOUNTER — Ambulatory Visit (INDEPENDENT_AMBULATORY_CARE_PROVIDER_SITE_OTHER): Payer: Medicare Other | Admitting: Internal Medicine

## 2014-08-19 ENCOUNTER — Encounter: Payer: Self-pay | Admitting: Internal Medicine

## 2014-08-19 VITALS — BP 100/66 | HR 70 | Ht 64.0 in | Wt 130.0 lb

## 2014-08-19 DIAGNOSIS — F0391 Unspecified dementia with behavioral disturbance: Secondary | ICD-10-CM

## 2014-08-19 DIAGNOSIS — Z8679 Personal history of other diseases of the circulatory system: Secondary | ICD-10-CM

## 2014-08-19 DIAGNOSIS — J438 Other emphysema: Secondary | ICD-10-CM

## 2014-08-19 DIAGNOSIS — I482 Chronic atrial fibrillation: Secondary | ICD-10-CM

## 2014-08-19 DIAGNOSIS — I639 Cerebral infarction, unspecified: Secondary | ICD-10-CM

## 2014-08-19 DIAGNOSIS — I4821 Permanent atrial fibrillation: Secondary | ICD-10-CM

## 2014-08-19 NOTE — Progress Notes (Signed)
Patient ID: Angelica [name], female   DOB: [birth year], 81 y.o.   MRN: [id number]    OFFICE NOTE  Chief Complaint:  Routine follow-up  Primary Care Physician: [name], MD  HPI:  Angelica [name] is an 81 yo female with permanent atrial fibrillation, AV malformation, and diastolic heart failure. She has previously had issues with bleeding and has been followed by oncology for this and her INR. She was diagnosed with COPD at some point; however, this has been questioned by her pulmonologist, who is Dr. Lamonte Sakai. He has tried her on and off of inhalers with not much improvement in her shortness of breath.  Since her last office visit she was in the hospital in July for cellulitis. She had hypokalemia at that time and was taken off of her lasix. She then had increased swelling in her LE and was taken to the ED to have this evaluated. She was advised to start her lasix at that time. Daughter notes potassium has always run on the low side. Is now on potassium pill. Is taking lasix 80 once a day. No edema, dyspnea, orthopnea, chest pain, palpitations, PND. Last INR was 1.8. Next INR check is next Tuesday. Notes some easy bruising and some blood in stools on occult testing, no visible blood. Notes no changes in bleeding. Are checking daily weights and these have been stable.  Angelica [name] returns today in follow-up. She reports no new heart failure symptoms. Her lower chimney swelling has improved on increased dose Lasix. She's lost about 15 pounds and her daughter reports her appetite is pretty good. She says that she's not had any problem eating but it's unclear why she is continuing to lose some weight. Her A. fib is permanent and rate controlled. She's followed by the cancer center for her INR is which of been maintained on the low end of therapeutic due to Hemoccult-positive stools. She has a history of AVMs but has not seen a GI doctor in years. She refuses to have another colonoscopy. She continues  to have occult positive stools which is likely the source of her bleeding.  PMHx:  Past Medical History  Diagnosis Date  . Atrial fibrillation   . Hypertension   . Arthritis   . GERD (gastroesophageal reflux disease)   . COPD (chronic obstructive pulmonary disease)   . PNA (pneumonia)   . Anemia due to GI blood loss 07/11/2011  . 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 Stroke 2009    aphasia and memory impairment as residual  . Hypoxia   . Atrial fibrillation, permanent, rate controlled on coumadin 05/10/2011  . Hypogammaglobulinemia 06/01/2012    Immunoglobulins 05/01/2012-normal IgG M., normal IgA,  low IgG [phone]).   . 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 CVA history 05/10/2011  . CHF (congestive heart failure) 39/76/7341    diastolic  . History of nuclear stress test 2009    normal dobutamine myoview    Past Surgical History  Procedure Laterality Date  . Hip fracture surgery    . Cholecystectomy    . Knee surgery    . Cataract extraction    . Transthoracic echocardiogram  06/2011    EF 55-60%; mild LVH; LA & RA mod dilated; mod TR    FAMHx:  Family History  Problem Relation Age of Onset  . Heart disease Mother   . Rectal cancer Mother   . Leukemia Father   . Heart failure Mother   .  Stroke Mother   . Cancer Maternal Grandmother   . Hypertension Son   . Hypertension Daughter     SOCHx:   reports that she has never smoked. She has never used smokeless tobacco. She reports that she does not drink alcohol or use illicit drugs.  ALLERGIES:  Allergies  Allergen Reactions  . Amoxicillin Hives  . Penicillins Hives    ROS: A comprehensive review of systems was negative.  HOME MEDS: Current Outpatient Prescriptions  Medication Sig Dispense Refill  . albuterol (PROVENTIL) (2.5 MG/3ML) 0.083% nebulizer solution   5  . budesonide (PULMICORT) 0.5 MG/2ML nebulizer  solution   5  . diltiazem (DILACOR XR) 180 MG 24 hr capsule Take 180 mg by mouth daily before breakfast.     . ferrous sulfate 325 (65 FE) MG tablet Take 325 mg by mouth 2 (two) times daily with a meal.     . furosemide (LASIX) 80 MG tablet Take 80 mg by mouth daily.     Marland Kitchen loratadine (ALLERGY) 10 MG tablet Take 10 mg by mouth daily.    . Multiple Vitamins-Minerals (MULTIVITAMINS THER. W/MINERALS) TABS Take 1 tablet by mouth every evening.     . pantoprazole (PROTONIX) 40 MG tablet Take 40 mg by mouth daily.    . potassium chloride (K-DUR) 10 MEQ tablet Take 2 tablets (20 mEq total) by mouth daily. 30 tablet 0  . predniSONE (DELTASONE) 5 MG tablet Take 10 mg by mouth every other day.     . warfarin (COUMADIN) 6 MG tablet Take 6 mg by mouth as directed.     No current facility-administered medications for this visit.    LABS/IMAGING: No results found for this or any previous visit (from the past 48 hour(s)). No results found.  VITALS: BP 100/66 mmHg  Pulse 70  Ht 5\' 4"  (1.626 m)  Wt 130 lb (58.968 kg)  BMI 22.30 kg/m2  EXAM: General appearance: alert, cooperative and no distress Neck: no adenopathy, no carotid bruit, no JVD and supple, symmetrical, trachea midline Lungs: clear to auscultation bilaterally Heart: irregularly irregular rhythm and no murmur Abdomen: soft, non-tender; bowel sounds normal; no masses,  no organomegaly Extremities: edema lower extremity 1+ bilateral to just above the ankle Pulses: 2+ and symmetric radial  EKG: Afib with rate of 70, right bundle branch block  ASSESSMENT: 1. Permanent atrial fibrillation 2. Chronic diastolic heart failure 3. COPD 4. Anemia with AVMs  PLAN: 1.   [name] has rate-controlled atrial fibrillation and INRs are followed by the cancer center. She receives intermittent iron transfusions for iron deficiency anemia secondary to ongoing GI bleeding. She is unwilling to have another colonoscopy and is known to have AVMs. Her  heart failure is managed appears euvolemic today. Her weight is actually declining which we have to watch very carefully. I encouraged increased protein in her diet but she is not interested in that either. Her COPD appears to be stable. Plan to see her back in 6 months.  Pixie Casino, MD, [facility] Attending Cardiologist [facility]  Ege Muckey C 08/19/2014, 11:23 AM

## 2014-08-19 NOTE — Patient Instructions (Addendum)
Your physician wants you to follow-up in: 6 months with Dr. Hilty. You will receive a reminder letter in the mail two months in advance. If you don't receive a letter, please call our office to schedule the follow-up appointment.    

## 2014-08-20 ENCOUNTER — Other Ambulatory Visit (HOSPITAL_BASED_OUTPATIENT_CLINIC_OR_DEPARTMENT_OTHER): Payer: Medicare Other | Admitting: Lab

## 2014-08-20 DIAGNOSIS — I482 Chronic atrial fibrillation: Secondary | ICD-10-CM

## 2014-08-20 DIAGNOSIS — Z7901 Long term (current) use of anticoagulants: Secondary | ICD-10-CM

## 2014-08-20 LAB — CBC WITH DIFFERENTIAL (CANCER CENTER ONLY)
BASO#: 0 10*3/uL (ref 0.0–0.2)
BASO%: 0.1 % (ref 0.0–2.0)
EOS%: 10.3 % — AB (ref 0.0–7.0)
Eosinophils Absolute: 0.8 10*3/uL — ABNORMAL HIGH (ref 0.0–0.5)
HEMATOCRIT: 38.3 % (ref 34.8–46.6)
HGB: 12.1 g/dL (ref 11.6–15.9)
LYMPH#: 0.7 10*3/uL — ABNORMAL LOW (ref 0.9–3.3)
LYMPH%: 9.2 % — ABNORMAL LOW (ref 14.0–48.0)
MCH: 26.9 pg (ref 26.0–34.0)
MCHC: 31.6 g/dL — AB (ref 32.0–36.0)
MCV: 85 fL (ref 81–101)
MONO#: 0.9 10*3/uL (ref 0.1–0.9)
MONO%: 11.4 % (ref 0.0–13.0)
NEUT#: 5.3 10*3/uL (ref 1.5–6.5)
NEUT%: 69 % (ref 39.6–80.0)
Platelets: 441 10*3/uL — ABNORMAL HIGH (ref 145–400)
RBC: 4.49 10*6/uL (ref 3.70–5.32)
RDW: 15.6 % (ref 11.1–15.7)
WBC: 7.7 10*3/uL (ref 3.9–10.0)

## 2014-08-20 LAB — FERRITIN CHCC: FERRITIN: 152 ng/mL (ref 9–269)

## 2014-08-20 LAB — IRON AND TIBC CHCC
%SAT: 15 % — ABNORMAL LOW (ref 21–57)
Iron: 36 ug/dL — ABNORMAL LOW (ref 41–142)
TIBC: 233 ug/dL — ABNORMAL LOW (ref 236–444)
UIBC: 197 ug/dL (ref 120–384)

## 2014-08-20 LAB — POCT INR: INR: 2.67

## 2014-08-21 ENCOUNTER — Ambulatory Visit: Payer: PRIVATE HEALTH INSURANCE | Admitting: Pharmacist

## 2014-08-21 DIAGNOSIS — I4821 Permanent atrial fibrillation: Secondary | ICD-10-CM

## 2014-08-21 DIAGNOSIS — I639 Cerebral infarction, unspecified: Secondary | ICD-10-CM

## 2014-08-21 LAB — PROTHROMBIN TIME
INR: 2.67 — ABNORMAL HIGH (ref <1.50)
PROTHROMBIN TIME: 28.4 s — AB (ref 11.6–15.2)

## 2014-08-21 NOTE — Patient Instructions (Signed)
Decrease coumadin dose  to 6 mg daily except for 4 mg Tuesday, Thursday and Saturday.   Repeat INR in 4 week on 09/16/14 at [facility] at 11am. We will call you with the results

## 2014-08-21 NOTE — Progress Notes (Signed)
*  Telephone encounter - no charge* INR at 2.67 from 08/20/14 INR not resulted by 5pm last night Called pt daughter [name]) this morning to discuss results INR slightly above tight goal range of 1.8-2.3 Hgb has improved to > 12. No issues to report No missed or extra doses No unusual bleeding or bruising No medication or diet changes Will slightly decrease dose Plans:  Decrease coumadin dose to 6 mg daily except for 4 mg Tuesday, Thursday and Saturday.   Repeat INR in 4 week on 09/16/14 at [facility] at 11am. We will call you with the results

## 2014-09-15 ENCOUNTER — Telehealth: Payer: Self-pay | Admitting: Pharmacist

## 2014-09-15 NOTE — Telephone Encounter (Signed)
[name] called and asked [place] to cancel Quincey's lab appt at [facility] for 09/16/14. She is having labs done on 09/23/14 at [name] with Dr. Noah Delaine. She asked [place] to have INR done that day as well. Order sent to Dr. Doristine Devoid office to draw INR as well on 09/23/14. Please fax results to [phone]. Appt at [facility] cancelled for 09/16/14.

## 2014-09-16 ENCOUNTER — Other Ambulatory Visit: Payer: PRIVATE HEALTH INSURANCE | Admitting: Lab

## 2014-09-25 ENCOUNTER — Ambulatory Visit (INDEPENDENT_AMBULATORY_CARE_PROVIDER_SITE_OTHER): Payer: PRIVATE HEALTH INSURANCE | Admitting: Pharmacist

## 2014-09-25 DIAGNOSIS — I482 Chronic atrial fibrillation: Secondary | ICD-10-CM

## 2014-09-25 LAB — POCT INR: INR: 1.6

## 2014-09-25 NOTE — Progress Notes (Signed)
Pt had her INR done at her PCP appmt This was a phone consult with [facility] Barefoot INR=1.6 (goal 1.8-2.3) No changes to report. Increasing her coumadin dose to 6mg  daily with 4mg  on Tue and Sat. We will check your INR at [facility] on 4/14 at 10:45 for lab. A pharmacist will call you with the results and instructions.

## 2014-09-25 NOTE — Patient Instructions (Signed)
Change your coumadin dosing to 6mg  daily with 4mg  on Tue and Sat. We will check your INR at [facility] on 4/14 at 10:45 for lab. A pharmacist will call you with the results and instructions.

## 2014-10-13 ENCOUNTER — Emergency Department (HOSPITAL_COMMUNITY)
Admission: EM | Admit: 2014-10-13 | Discharge: 2014-10-13 | Disposition: A | Discharge status: Home / Self Care | Payer: Medicare Other | Attending: Emergency Medicine | Admitting: Emergency Medicine

## 2014-10-13 ENCOUNTER — Encounter (HOSPITAL_COMMUNITY): Payer: Self-pay | Admitting: Emergency Medicine

## 2014-10-13 ENCOUNTER — Emergency Department (HOSPITAL_COMMUNITY): Payer: Medicare Other

## 2014-10-13 DIAGNOSIS — D500 Iron deficiency anemia secondary to blood loss (chronic): Secondary | ICD-10-CM | POA: Insufficient documentation

## 2014-10-13 DIAGNOSIS — Z79899 Other long term (current) drug therapy: Secondary | ICD-10-CM | POA: Insufficient documentation

## 2014-10-13 DIAGNOSIS — K219 Gastro-esophageal reflux disease without esophagitis: Secondary | ICD-10-CM | POA: Insufficient documentation

## 2014-10-13 DIAGNOSIS — I5030 Unspecified diastolic (congestive) heart failure: Secondary | ICD-10-CM | POA: Diagnosis not present

## 2014-10-13 DIAGNOSIS — M1990 Unspecified osteoarthritis, unspecified site: Secondary | ICD-10-CM | POA: Insufficient documentation

## 2014-10-13 DIAGNOSIS — Z8673 Personal history of transient ischemic attack (TIA), and cerebral infarction without residual deficits: Secondary | ICD-10-CM | POA: Diagnosis not present

## 2014-10-13 DIAGNOSIS — F0390 Unspecified dementia without behavioral disturbance: Secondary | ICD-10-CM | POA: Insufficient documentation

## 2014-10-13 DIAGNOSIS — E876 Hypokalemia: Secondary | ICD-10-CM | POA: Diagnosis not present

## 2014-10-13 DIAGNOSIS — R531 Weakness: Secondary | ICD-10-CM | POA: Diagnosis present

## 2014-10-13 DIAGNOSIS — R509 Fever, unspecified: Secondary | ICD-10-CM | POA: Diagnosis not present

## 2014-10-13 DIAGNOSIS — Z880 Allergy status to penicillin: Secondary | ICD-10-CM | POA: Insufficient documentation

## 2014-10-13 DIAGNOSIS — Z7901 Long term (current) use of anticoagulants: Secondary | ICD-10-CM | POA: Insufficient documentation

## 2014-10-13 DIAGNOSIS — J441 Chronic obstructive pulmonary disease with (acute) exacerbation: Secondary | ICD-10-CM | POA: Insufficient documentation

## 2014-10-13 DIAGNOSIS — I10 Essential (primary) hypertension: Secondary | ICD-10-CM | POA: Diagnosis not present

## 2014-10-13 DIAGNOSIS — I4891 Unspecified atrial fibrillation: Secondary | ICD-10-CM | POA: Diagnosis not present

## 2014-10-13 LAB — CBC WITH DIFFERENTIAL/PLATELET
Basophils Absolute: 0 10*3/uL (ref 0.0–0.1)
Basophils Relative: 0 % (ref 0–1)
Eosinophils Absolute: 0.4 10*3/uL (ref 0.0–0.7)
Eosinophils Relative: 6 % — ABNORMAL HIGH (ref 0–5)
HCT: 37.1 % (ref 36.0–46.0)
Hemoglobin: 11.2 g/dL — ABNORMAL LOW (ref 12.0–15.0)
LYMPHS ABS: 0.6 10*3/uL — AB (ref 0.7–4.0)
Lymphocytes Relative: 8 % — ABNORMAL LOW (ref 12–46)
MCH: 27.2 pg (ref 26.0–34.0)
MCHC: 30.2 g/dL (ref 30.0–36.0)
MCV: 90 fL (ref 78.0–100.0)
Monocytes Absolute: 0.9 10*3/uL (ref 0.1–1.0)
Monocytes Relative: 12 % (ref 3–12)
NEUTROS PCT: 74 % (ref 43–77)
Neutro Abs: 5.6 10*3/uL (ref 1.7–7.7)
Platelets: 466 10*3/uL — ABNORMAL HIGH (ref 150–400)
RBC: 4.12 MIL/uL (ref 3.87–5.11)
RDW: 16.1 % — ABNORMAL HIGH (ref 11.5–15.5)
WBC: 7.5 10*3/uL (ref 4.0–10.5)

## 2014-10-13 LAB — BASIC METABOLIC PANEL
Anion gap: 12 (ref 5–15)
BUN: 10 mg/dL (ref 6–23)
CO2: 28 mmol/L (ref 19–32)
Calcium: 8.8 mg/dL (ref 8.4–10.5)
Chloride: 103 mmol/L (ref 96–112)
Creatinine, Ser: 0.91 mg/dL (ref 0.50–1.10)
GFR calc Af Amer: 63 mL/min — ABNORMAL LOW (ref >90)
GFR calc non Af Amer: 54 mL/min — ABNORMAL LOW (ref >90)
Glucose, Bld: 106 mg/dL — ABNORMAL HIGH (ref 70–99)
Potassium: 3.2 mmol/L — ABNORMAL LOW (ref 3.5–5.1)
Sodium: 143 mmol/L (ref 135–145)

## 2014-10-13 LAB — URINALYSIS, ROUTINE W REFLEX MICROSCOPIC
Glucose, UA: NEGATIVE mg/dL
Hgb urine dipstick: NEGATIVE
Ketones, ur: NEGATIVE mg/dL
Leukocytes, UA: NEGATIVE
Nitrite: NEGATIVE
Protein, ur: NEGATIVE mg/dL
Specific Gravity, Urine: 1.018 (ref 1.005–1.030)
Urobilinogen, UA: 0.2 mg/dL (ref 0.0–1.0)
pH: 5.5 (ref 5.0–8.0)

## 2014-10-13 LAB — POCT INR: INR: 3.06

## 2014-10-13 LAB — PROTIME-INR
INR: 3.08 — ABNORMAL HIGH (ref 0.00–1.49)
Prothrombin Time: 32.1 seconds — ABNORMAL HIGH (ref 11.6–15.2)

## 2014-10-13 LAB — CBG MONITORING, ED: GLUCOSE-CAPILLARY: 106 mg/dL — AB (ref 70–99)

## 2014-10-13 LAB — I-STAT CG4 LACTIC ACID, ED: Lactic Acid, Venous: 0.8 mmol/L (ref 0.5–2.0)

## 2014-10-13 LAB — I-STAT TROPONIN, ED: Troponin i, poc: 0.01 ng/mL (ref 0.00–0.08)

## 2014-10-13 MED ORDER — IPRATROPIUM BROMIDE 0.02 % IN SOLN
0.5000 mg | Freq: Once | RESPIRATORY_TRACT | Status: AC
  Administered 2014-10-13: 0.5 mg via RESPIRATORY_TRACT
  Filled 2014-10-13: qty 2.5

## 2014-10-13 MED ORDER — POTASSIUM CHLORIDE CRYS ER 10 MEQ PO TBCR
20.0000 meq | EXTENDED_RELEASE_TABLET | Freq: Once | ORAL | Status: AC
  Administered 2014-10-13: 20 meq via ORAL
  Filled 2014-10-13: qty 2

## 2014-10-13 MED ORDER — POTASSIUM CHLORIDE CRYS ER 20 MEQ PO TBCR
40.0000 meq | EXTENDED_RELEASE_TABLET | Freq: Once | ORAL | Status: AC
  Administered 2014-10-13: 40 meq via ORAL
  Filled 2014-10-13: qty 2

## 2014-10-13 MED ORDER — ALBUTEROL SULFATE (2.5 MG/3ML) 0.083% IN NEBU
5.0000 mg | INHALATION_SOLUTION | Freq: Once | RESPIRATORY_TRACT | Status: AC
  Administered 2014-10-13: 5 mg via RESPIRATORY_TRACT
  Filled 2014-10-13: qty 6

## 2014-10-13 NOTE — ED Provider Notes (Signed)
CSN: [id number]     Arrival date & time 10/13/14  1057 History   First MD Initiated Contact with Patient 10/13/14 1106     Chief Complaint  Patient presents with  . Weakness     (Consider location/radiation/quality/duration/timing/severity/associated sxs/prior Treatment) HPI Comments: Patient with a history of COPD, Atrial Fibrillation on Coumadin, CHF, Stroke, and Dementia brought in today by EMS from [facility] due to right shoulder pain.  Patient's daughter reports that the patient currently lives in the independent living area of [name].  She has a [place] that comes in and gives the patient her medications.  According to the [place] the patient seemed more tired over the weekend.  Her daughter reports that when she went to [facility] this morning the patient seemed more tired.  Daughter did not notice any facial droop, focal weakness, or slurred speech.  Daughter also thought that the patient felt warm, but did not check her temperature.  Patient was also complaining of pain of the right shoulder.  No fall or injury.  Patient denies any chest pain, SOB, focal weakness, headache, nausea, or vomiting.  She has had an occasional cough.    Patient is a 81 y.o. female presenting with weakness. The history is provided by the patient.  Weakness Associated symptoms include weakness.    Past Medical History  Diagnosis Date  . Atrial fibrillation   . Hypertension   . Arthritis   . GERD (gastroesophageal reflux disease)   . COPD (chronic obstructive pulmonary disease)   . PNA (pneumonia)   . Anemia due to GI blood loss 07/11/2011  . 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 Stroke 2009    aphasia and memory impairment as residual  . Hypoxia   . Atrial fibrillation, permanent, rate controlled on coumadin 05/10/2011  . Hypogammaglobulinemia 06/01/2012     Immunoglobulins 05/01/2012-normal IgG M., normal IgA,  low IgG 344([id number]).   . 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 CVA history 05/10/2011  . CHF (congestive heart failure) 37/85/8850    diastolic  . History of nuclear stress test 2009    normal dobutamine myoview   Past Surgical History  Procedure Laterality Date  . Hip fracture surgery    . Cholecystectomy    . Knee surgery    . Cataract extraction    . Transthoracic echocardiogram  06/2011    EF 55-60%; mild LVH; LA & RA mod dilated; mod TR   Family History  Problem Relation Age of Onset  . Heart disease Mother   . Rectal cancer Mother   . Leukemia Father   . Heart failure Mother   . Stroke Mother   . Cancer Maternal Grandmother   . Hypertension Son   . Hypertension Daughter    History  Substance Use Topics  . Smoking status: Never Smoker   . Smokeless tobacco: Never Used  . Alcohol Use: No   OB History    No data available     Review of Systems  Neurological: Positive for weakness.  All other systems reviewed and are negative.     Allergies  Amoxicillin and Penicillins  Home Medications   Prior to Admission medications   Medication Sig Start Date End Date Taking? Authorizing Provider  albuterol (PROVENTIL) (2.5 MG/3ML) 0.083% nebulizer solution  07/14/14   Historical Provider, MD  budesonide (PULMICORT) 0.5 MG/2ML nebulizer solution  06/30/14   Historical Provider, MD  diltiazem (DILACOR XR) 180 MG 24 hr capsule Take 180 mg by mouth daily before breakfast.     Historical Provider, MD  ferrous sulfate 325 (65 FE) MG tablet Take 325 mg by mouth 2 (two) times daily with a meal.     Historical Provider, MD  furosemide (LASIX) 80 MG tablet Take 80 mg by mouth daily.     Historical Provider, MD  loratadine (ALLERGY) 10 MG tablet Take 10 mg by mouth daily.    Historical Provider, MD  Multiple Vitamins-Minerals (MULTIVITAMINS THER. W/MINERALS) TABS Take 1 tablet by mouth every evening.     Historical Provider, MD   pantoprazole (PROTONIX) 40 MG tablet Take 40 mg by mouth daily.    Historical Provider, MD  potassium chloride (K-DUR) 10 MEQ tablet Take 2 tablets (20 mEq total) by mouth daily. 04/18/14   Kelvin Cellar, MD  predniSONE (DELTASONE) 5 MG tablet Take 10 mg by mouth every other day.     Historical Provider, MD  warfarin (COUMADIN) 6 MG tablet Take 6 mg by mouth as directed.    Historical Provider, MD   BP 150/82 mmHg  Pulse 91  Temp(Src) 98.5 F (36.9 C) (Oral)  Resp 15  SpO2 93% Physical Exam  Constitutional: She appears well-developed and well-nourished.  HENT:  Head: Normocephalic and atraumatic.  Mouth/Throat: Oropharynx is clear and moist.  Eyes: EOM are normal. Pupils are equal, round, and reactive to light.  Neck: Normal range of motion. Neck supple.  Cardiovascular: Normal rate, regular rhythm and normal heart sounds.   Pulmonary/Chest: Effort normal. She has wheezes.  Abdominal: Soft. Bowel sounds are normal. She exhibits no distension and no mass. There is no tenderness. There is no rebound and no guarding.  Musculoskeletal: She exhibits no edema.  Increased pain with ROM of the right shoulder.  Full ROM of other extremities.   Neurological: She is alert. She has normal strength. No cranial nerve deficit or sensory deficit.  Skin: Skin is warm and dry.  Small skin tear of the right shin  Psychiatric: She has a normal mood and affect.  Nursing note and vitals reviewed.   ED Course  Procedures (including critical care time) Labs Review Labs Reviewed  BASIC METABOLIC PANEL - Abnormal; Notable for the following:    Potassium 3.2 (*)    Glucose, Bld 106 (*)    GFR calc non Af Amer 54 (*)    GFR calc Af Amer 63 (*)    All other components within normal limits  CBG MONITORING, ED - Abnormal; Notable for the following:    Glucose-Capillary 106 (*)    All other components within normal limits  URINE CULTURE  CBC WITH DIFFERENTIAL/PLATELET  URINALYSIS, ROUTINE W REFLEX  MICROSCOPIC  PROTIME-INR  I-STAT CG4 LACTIC ACID, ED    Imaging Review No results found.   EKG Interpretation   Date/Time:  Monday October 13 2014 11:14:36 EDT Ventricular Rate:  88 PR Interval:    QRS Duration: 129 QT Interval:  380 QTC Calculation: 460 R Axis:   -56 Text Interpretation:  Atrial fibrillation Multiple ventricular premature  complexes Right bundle branch block Inferior infarct, old Anterior  infarct, age indeterminate Baseline wander in lead(s) V3 Poor data quality  in current ECG precludes serial comparison Confirmed by OTTER  MD, OLGA  ([id number]) on 10/13/2014 11:17:37 AM     3:00 PM Reassessed patient and discussed results with the patient and daughter.  Lungs with diffuse wheezing.  Daughter reports that pain gets regular  nebs while at [facility] and that she missed her routine neb while here.  Will order neb and reassess.   MDM   Final diagnoses:  Fever   Patient brought in today by EMS due to right shoulder pain and fatigue.  Daughter called EMS after she went to [place] home to visit her mom this morning and she was complaining of right shoulder pain and seemed more fatigued.  No focal weakness, difficulty swallowing, or difficulty speaking.  No fall or injury.  She is afebrile.  Labs unremarkable.  No ischemic changes on EKG.  Troponin negative.  CXR is negative.  UA negative for infection.  Right shoulder xray without acute findings. Patient signed out at shift change to Domenic Moras, PA-C.  Plan is for the patient to be discharged home once neb is completed and if she is able to ambulate without hypoxia.    Hyman Bible, PA-C 10/14/14 2250  Elnora Morrison, MD 10/17/14 [phone]

## 2014-10-13 NOTE — ED Notes (Signed)
Bed: BV67 Expected date:  Expected time:  Means of arrival:  Comments: AMS from nursing home

## 2014-10-13 NOTE — ED Notes (Signed)
Per EMS pt from [facility] with complaint of increased weakness and fever since yesterday. Pt reports right shoulder pain for 4 or 5 days; denies injury.

## 2014-10-13 NOTE — ED Provider Notes (Signed)
Receiving care at the beginning of shift.  Pt presents with generalized weakness.  No source of infection identified.Pt missed her scheduled nebs while in ER for evaluation of weakness.    She will be receiving nebs here and if able to maintaining adequate O2 on RA then she can be discharged back to her living facility.    5:40 PM Pt received her nebs.  Ambulate while maintaining adequate O2, stable for discharge.   BP 115/75 mmHg  Pulse 120  Temp(Src) 98.5 F (36.9 C) (Oral)  Resp 19  SpO2 91%  Tachycardiac from recent nebs.  Will recheck  I have reviewed nursing notes and vital signs. I personally reviewed the imaging tests through PACS system  I reviewed available ER/hospitalization records thought the EMR  Results for orders placed or performed during the hospital encounter of 63/87/56  Basic metabolic panel  (at [facility])  Result Value Ref Range   Sodium 143 135 - 145 mmol/L   Potassium 3.2 (L) 3.5 - 5.1 mmol/L   Chloride 103 96 - 112 mmol/L   CO2 28 19 - 32 mmol/L   Glucose, Bld 106 (H) 70 - 99 mg/dL   BUN 10 6 - 23 mg/dL   Creatinine, Ser 0.91 0.50 - 1.10 mg/dL   Calcium 8.8 8.4 - 10.5 mg/dL   GFR calc non Af Amer 54 (L) >90 mL/min   GFR calc Af Amer 63 (L) >90 mL/min   Anion gap 12 5 - 15  CBC with Differential/Platelet  Result Value Ref Range   WBC 7.5 4.0 - 10.5 K/uL   RBC 4.12 3.87 - 5.11 MIL/uL   Hemoglobin 11.2 (L) 12.0 - 15.0 g/dL   HCT 37.1 36.0 - 46.0 %   MCV 90.0 78.0 - 100.0 fL   MCH 27.2 26.0 - 34.0 pg   MCHC 30.2 30.0 - 36.0 g/dL   RDW 16.1 (H) 11.5 - 15.5 %   Platelets 466 (H) 150 - 400 K/uL   Neutrophils Relative % 74 43 - 77 %   Neutro Abs 5.6 1.7 - 7.7 K/uL   Lymphocytes Relative 8 (L) 12 - 46 %   Lymphs Abs 0.6 (L) 0.7 - 4.0 K/uL   Monocytes Relative 12 3 - 12 %   Monocytes Absolute 0.9 0.1 - 1.0 K/uL   Eosinophils Relative 6 (H) 0 - 5 %   Eosinophils Absolute 0.4 0.0 - 0.7 K/uL   Basophils Relative 0 0 - 1 %   Basophils Absolute  0.0 0.0 - 0.1 K/uL  Urinalysis, Routine w reflex microscopic  Result Value Ref Range   Color, Urine YELLOW YELLOW   APPearance CLEAR CLEAR   Specific Gravity, Urine 1.018 1.005 - 1.030   pH 5.5 5.0 - 8.0   Glucose, UA NEGATIVE NEGATIVE mg/dL   Hgb urine dipstick NEGATIVE NEGATIVE   Bilirubin Urine SMALL (A) NEGATIVE   Ketones, ur NEGATIVE NEGATIVE mg/dL   Protein, ur NEGATIVE NEGATIVE mg/dL   Urobilinogen, UA 0.2 0.0 - 1.0 mg/dL   Nitrite NEGATIVE NEGATIVE   Leukocytes, UA NEGATIVE NEGATIVE  Protime-INR  Result Value Ref Range   Prothrombin Time 32.1 (H) 11.6 - 15.2 seconds   INR 3.08 (H) 0.00 - 1.49  CBG, ED  Result Value Ref Range   Glucose-Capillary 106 (H) 70 - 99 mg/dL   Comment 1 Notify RN    Comment 2 Document in Chart   I-Stat CG4 Lactic Acid, ED  Result Value Ref Range   Lactic  Acid, Venous 0.80 0.5 - 2.0 mmol/L  I-stat troponin, ED  Result Value Ref Range   Troponin i, poc 0.01 0.00 - 0.08 ng/mL   Comment 3           Dg Chest 2 View  10/13/2014   CLINICAL DATA:  Fever.  EXAM: CHEST  2 VIEW  COMPARISON:  June 09, 2014.  FINDINGS: Stable cardiomediastinal silhouette. No pneumothorax or pleural effusion is. Both lungs are clear. Degenerative change of the left glenohumeral joint is noted.  IMPRESSION: No active cardiopulmonary disease.   Electronically Signed   By: Marijo Conception, M.D.   On: 10/13/2014 12:19   Dg Shoulder Right  10/13/2014   CLINICAL DATA:  Left shoulder pain for 4-5 days.  No recent injury.  EXAM: RIGHT SHOULDER - 2+ VIEW  COMPARISON:  CT scan and plain films left shoulder 11/14/2012.  FINDINGS: Healed surgical neck fracture right humerus. Right humerus is identified. No acute bony or joint abnormality is seen. Mild acromioclavicular degenerative disease is noted. Imaged right lung and ribs appear normal.  IMPRESSION: No acute abnormality.  Remote healed surgical neck fracture right humerus.   Electronically Signed   By: Inge Rise M.D.   On:  10/13/2014 12:17      Domenic Moras, PA-C 10/13/14 1741  Orpah Greek, MD 10/13/14 [phone]

## 2014-10-13 NOTE — ED Notes (Signed)
Pt tried to give a urine sample on the bedpan but was unsuccessful. Pt states she does not feel like she has to go right now.

## 2014-10-13 NOTE — ED Notes (Signed)
In and out done by Burt Knack and I

## 2014-10-13 NOTE — Progress Notes (Signed)
CSW met with pt at bedside. Daughter was present. Patient confirms that she presents to [facility] due to increased weakness and fever since yesterday.  Patient confirms that she is from [facility]. She states that she has been at the facility for the past 2 and a half weeks. Patient states that she is in the independent living unit and that she has not fell within the past 6 months. Patient states that she can complete her ADL's independently. Daughter confirms but informed CSW that she does often assist the pt when taking a shower. Daughter states she is the pt's primary support and that she visits the pt everyday. Daughter states that she lives in [place].   Patient informed CSW that she feels better than when she first came to [facility].   Patient and daughter did not express any concerns and state that they do not have any questions at this time.  Daughter/Linda Margo [phone], [place] ED CSW

## 2014-10-13 NOTE — Discharge Instructions (Signed)
You have been evaluated for weakness.  No obvious source of infection identified during this ER visit.  Your potassium is low, please take supplementation and follow up with your doctor for further care.   Weakness Weakness is a lack of strength. You may feel weak all over your body or just in one part of your body. Weakness can be serious. In some cases, you may need more medical tests. HOME [place] a well-balanced diet.  Try to exercise every day.  Only take medicines as told by your doctor. GET HELP RIGHT AWAY IF:   You cannot do your normal daily activities.  You cannot walk up and down stairs, or you feel very tired when you do so.  You have shortness of breath or chest pain.  You have trouble moving parts of your body.  You have weakness in only one body part or on only one side of the body.  You have a fever.  You have trouble speaking or swallowing.  You cannot control when you pee (urinate) or poop (bowel movement).  You have black or bloody throw up (vomit) or poop.  Your weakness gets worse or spreads to other body parts.  You have new aches or pains. MAKE SURE YOU:   Understand these instructions.  Will watch your condition.  Will get help right away if you are not doing well or get worse. Document Released: 06/02/2008 Document Revised: 12/20/2011 Document Reviewed: 08/19/2011 [facility] Patient Information 2015 [place], [place]. This information is not intended to replace advice given to you by your health care provider. Make sure you discuss any questions you have with your health care provider.

## 2014-10-14 ENCOUNTER — Ambulatory Visit: Payer: PRIVATE HEALTH INSURANCE | Admitting: Pharmacist

## 2014-10-14 DIAGNOSIS — I4821 Permanent atrial fibrillation: Secondary | ICD-10-CM

## 2014-10-14 DIAGNOSIS — I639 Cerebral infarction, unspecified: Secondary | ICD-10-CM

## 2014-10-14 LAB — URINE CULTURE
Colony Count: NO GROWTH
Culture: NO GROWTH

## 2014-10-14 NOTE — Progress Notes (Signed)
No charge phone encounter Angelica [name] called in today as Angelica [name] was dc from hospital yesterday  She only spent one day there for fatigue INR was 3.08 [name] held her Coumadin yesterday She prefers the INR to stay close to 2. Will change a 6mg  day back to 4mg  day  Weekly dose 6mg  daily with 4mg  on Mon, Wed and Fri Will have INR checked in [facility] on 10/27/14 as she requested at 10:45 Pt is not having any S/S of bleeding although [name] says she is prone to bleeding when she gets over [place] was with patient today Sent message to [place] to schedule above requested appmt and to notify [name] if her request was not available and to notify pharmacy.

## 2014-10-14 NOTE — Patient Instructions (Signed)
Decrease coumadin slightly to 6mg  daily with 4mg  on Mon, Wed and Fri.  Repeat INR in 2 weeks on 10/27/14 at [facility] at 10:45am. We will call you with the results

## 2014-10-16 ENCOUNTER — Other Ambulatory Visit: Payer: Self-pay | Admitting: Emergency Medicine

## 2014-10-16 ENCOUNTER — Other Ambulatory Visit: Payer: Medicare Other

## 2014-10-27 ENCOUNTER — Other Ambulatory Visit: Payer: Medicare Other

## 2014-10-29 ENCOUNTER — Telehealth: Payer: Self-pay | Admitting: *Deleted

## 2014-10-29 ENCOUNTER — Ambulatory Visit: Payer: PRIVATE HEALTH INSURANCE | Admitting: Pharmacist

## 2014-10-29 ENCOUNTER — Other Ambulatory Visit: Payer: Self-pay | Admitting: Hematology and Oncology

## 2014-10-29 ENCOUNTER — Other Ambulatory Visit (HOSPITAL_BASED_OUTPATIENT_CLINIC_OR_DEPARTMENT_OTHER): Payer: Medicare Other

## 2014-10-29 DIAGNOSIS — D500 Iron deficiency anemia secondary to blood loss (chronic): Secondary | ICD-10-CM

## 2014-10-29 DIAGNOSIS — Z7901 Long term (current) use of anticoagulants: Secondary | ICD-10-CM

## 2014-10-29 DIAGNOSIS — I4821 Permanent atrial fibrillation: Secondary | ICD-10-CM

## 2014-10-29 DIAGNOSIS — I639 Cerebral infarction, unspecified: Secondary | ICD-10-CM

## 2014-10-29 LAB — CBC WITH DIFFERENTIAL (CANCER CENTER ONLY)
BASO#: 0 10*3/uL (ref 0.0–0.2)
BASO%: 0.1 % (ref 0.0–2.0)
EOS ABS: 0.6 10*3/uL — AB (ref 0.0–0.5)
EOS%: 4.4 % (ref 0.0–7.0)
HEMATOCRIT: 32 % — AB (ref 34.8–46.6)
HGB: 9.9 g/dL — ABNORMAL LOW (ref 11.6–15.9)
LYMPH#: 0.7 10*3/uL — AB (ref 0.9–3.3)
LYMPH%: 5 % — AB (ref 14.0–48.0)
MCH: 27.3 pg (ref 26.0–34.0)
MCHC: 30.9 g/dL — ABNORMAL LOW (ref 32.0–36.0)
MCV: 88 fL (ref 81–101)
MONO#: 1.4 10*3/uL — AB (ref 0.1–0.9)
MONO%: 10.3 % (ref 0.0–13.0)
NEUT#: 10.6 10*3/uL — ABNORMAL HIGH (ref 1.5–6.5)
NEUT%: 80.2 % — ABNORMAL HIGH (ref 39.6–80.0)
Platelets: 528 10*3/uL — ABNORMAL HIGH (ref 145–400)
RBC: 3.62 10*6/uL — ABNORMAL LOW (ref 3.70–5.32)
RDW: 15.4 % (ref 11.1–15.7)
WBC: 13.2 10*3/uL — ABNORMAL HIGH (ref 3.9–10.0)

## 2014-10-29 LAB — RETICULOCYTES (CHCC)
ABS Retic: 132.1 10*3/uL (ref 19.0–186.0)
RBC.: 3.67 MIL/uL — AB (ref 3.87–5.11)
Retic Ct Pct: 3.6 % — ABNORMAL HIGH (ref 0.4–2.3)

## 2014-10-29 LAB — POCT INR: INR: 2.7

## 2014-10-29 LAB — IRON AND TIBC CHCC
%SAT: 11 % — AB (ref 21–57)
Iron: 25 ug/dL — ABNORMAL LOW (ref 41–142)
TIBC: 216 ug/dL — ABNORMAL LOW (ref 236–444)
UIBC: 191 ug/dL (ref 120–384)

## 2014-10-29 LAB — FERRITIN CHCC: Ferritin: 116 ng/ml (ref 9–269)

## 2014-10-29 LAB — PROTIME-INR (CHCC SATELLITE)
INR: 2.7 (ref 2.0–3.5)
Protime: 32.4 Seconds — ABNORMAL HIGH (ref 10.6–13.4)

## 2014-10-29 NOTE — Progress Notes (Signed)
I will take care of it.

## 2014-10-29 NOTE — Telephone Encounter (Signed)
Spoke with daughter. States "it is not possible to come in next week". Will be OK with CBC check in 2 weeks.

## 2014-10-29 NOTE — Telephone Encounter (Signed)
-----   Message from Heath Lark, MD sent at 10/29/2014  3:46 PM EDT ----- Regarding: need to see patient She has significant drop in blood count recently. I need to see her on Tuesday next week, either at 915, 1215 or 230 pm. Need repeat labs prior to seeing me

## 2014-10-29 NOTE — Progress Notes (Signed)
**  No charge-telephone encounter** **Dr. Beryle Beams pt**  INR above goal of 1.8-2.3.  H/H = 9.9/32.  Iron studies pending.  I spoke to [employer]	[place] on phone.  She states that her mom continues to have some blood in her stool, which is not new.  H/H is decreased.  Ms Audelia Acton will be calling PCP to discuss these results.  Will slightly decrease coumadin dose to 4mg  four times/wk and 6mg  three times/wk.  Will check PT/INR in 3-4 weeks at [facility].

## 2014-10-30 ENCOUNTER — Other Ambulatory Visit: Payer: Self-pay | Admitting: Hematology and Oncology

## 2014-10-30 ENCOUNTER — Telehealth: Payer: Self-pay | Admitting: Hematology and Oncology

## 2014-10-30 NOTE — Telephone Encounter (Signed)
Left message to confirm appointment 05/11. Mailed calendar.

## 2014-10-31 ENCOUNTER — Other Ambulatory Visit: Payer: Self-pay | Admitting: Hematology and Oncology

## 2014-11-03 ENCOUNTER — Telehealth: Payer: Self-pay | Admitting: Hematology and Oncology

## 2014-11-03 NOTE — Telephone Encounter (Signed)
Sent msg to add IV Iron on 05/11 after MD visit and 05/18 per 04/29 POF..... KJ

## 2014-11-06 ENCOUNTER — Telehealth: Payer: Self-pay | Admitting: Hematology and Oncology

## 2014-11-06 NOTE — Telephone Encounter (Signed)
returned call and s.w. pt dtr and confirmed appts.

## 2014-11-12 ENCOUNTER — Ambulatory Visit (HOSPITAL_BASED_OUTPATIENT_CLINIC_OR_DEPARTMENT_OTHER): Payer: Medicare Other | Admitting: Hematology and Oncology

## 2014-11-12 ENCOUNTER — Other Ambulatory Visit (HOSPITAL_BASED_OUTPATIENT_CLINIC_OR_DEPARTMENT_OTHER): Payer: Medicare Other

## 2014-11-12 ENCOUNTER — Ambulatory Visit (HOSPITAL_BASED_OUTPATIENT_CLINIC_OR_DEPARTMENT_OTHER): Payer: Medicare Other

## 2014-11-12 ENCOUNTER — Telehealth: Payer: Self-pay | Admitting: *Deleted

## 2014-11-12 ENCOUNTER — Telehealth: Payer: Self-pay | Admitting: Hematology and Oncology

## 2014-11-12 VITALS — BP 99/46 | HR 92 | Temp 97.8°F | Resp 18

## 2014-11-12 VITALS — BP 101/51 | HR 73 | Temp 98.2°F | Resp 17 | Ht 64.0 in | Wt 123.8 lb

## 2014-11-12 DIAGNOSIS — I4821 Permanent atrial fibrillation: Secondary | ICD-10-CM

## 2014-11-12 DIAGNOSIS — Z7901 Long term (current) use of anticoagulants: Secondary | ICD-10-CM

## 2014-11-12 DIAGNOSIS — D500 Iron deficiency anemia secondary to blood loss (chronic): Secondary | ICD-10-CM

## 2014-11-12 DIAGNOSIS — D473 Essential (hemorrhagic) thrombocythemia: Secondary | ICD-10-CM | POA: Diagnosis not present

## 2014-11-12 DIAGNOSIS — I482 Chronic atrial fibrillation: Secondary | ICD-10-CM

## 2014-11-12 DIAGNOSIS — R7989 Other specified abnormal findings of blood chemistry: Secondary | ICD-10-CM

## 2014-11-12 DIAGNOSIS — E876 Hypokalemia: Secondary | ICD-10-CM

## 2014-11-12 DIAGNOSIS — D75838 Other thrombocytosis: Secondary | ICD-10-CM

## 2014-11-12 LAB — CBC & DIFF AND RETIC
BASO%: 0.3 % (ref 0.0–2.0)
BASOS ABS: 0 10*3/uL (ref 0.0–0.1)
EOS ABS: 0.5 10*3/uL (ref 0.0–0.5)
EOS%: 4.3 % (ref 0.0–7.0)
HCT: 32.5 % — ABNORMAL LOW (ref 34.8–46.6)
HEMOGLOBIN: 9.9 g/dL — AB (ref 11.6–15.9)
Immature Retic Fract: 17.1 % — ABNORMAL HIGH (ref 1.60–10.00)
LYMPH%: 5.2 % — AB (ref 14.0–49.7)
MCH: 26.5 pg (ref 25.1–34.0)
MCHC: 30.5 g/dL — ABNORMAL LOW (ref 31.5–36.0)
MCV: 87.1 fL (ref 79.5–101.0)
MONO#: 1 10*3/uL — ABNORMAL HIGH (ref 0.1–0.9)
MONO%: 8.8 % (ref 0.0–14.0)
NEUT%: 81.4 % — ABNORMAL HIGH (ref 38.4–76.8)
NEUTROS ABS: 8.8 10*3/uL — AB (ref 1.5–6.5)
NRBC: 0 % (ref 0–0)
Platelets: 632 10*3/uL — ABNORMAL HIGH (ref 145–400)
RBC: 3.73 10*6/uL (ref 3.70–5.45)
RDW: 14.8 % — ABNORMAL HIGH (ref 11.2–14.5)
RETIC %: 2.61 % — AB (ref 0.70–2.10)
Retic Ct Abs: 97.35 10*3/uL — ABNORMAL HIGH (ref 33.70–90.70)
WBC: 10.8 10*3/uL — AB (ref 3.9–10.3)
lymph#: 0.6 10*3/uL — ABNORMAL LOW (ref 0.9–3.3)

## 2014-11-12 LAB — POCT INR: INR: 2.86

## 2014-11-12 LAB — HOLD TUBE, BLOOD BANK

## 2014-11-12 LAB — POTASSIUM (CC13): Potassium: 3.7 mEq/L (ref 3.5–5.1)

## 2014-11-12 LAB — PROTHROMBIN TIME
INR: 2.86 — ABNORMAL HIGH (ref <1.50)
Prothrombin Time: 30 seconds — ABNORMAL HIGH (ref 11.6–15.2)

## 2014-11-12 MED ORDER — SODIUM CHLORIDE 0.9 % IV SOLN
510.0000 mg | Freq: Once | INTRAVENOUS | Status: AC
  Administered 2014-11-12: 510 mg via INTRAVENOUS
  Filled 2014-11-12: qty 17

## 2014-11-12 MED ORDER — SODIUM CHLORIDE 0.9 % IV SOLN
Freq: Once | INTRAVENOUS | Status: AC
  Administered 2014-11-12: 13:00:00 via INTRAVENOUS

## 2014-11-12 NOTE — Telephone Encounter (Signed)
Pt confirmed labs/ov per 05/11 POF, gave pt AVS and Calendar.... KJ °

## 2014-11-12 NOTE — Telephone Encounter (Signed)
Daughter notified that Dr Alvy Bimler will be in touch with Dr Marin Olp regarding future labs

## 2014-11-12 NOTE — Patient Instructions (Signed)

## 2014-11-13 ENCOUNTER — Encounter: Payer: Self-pay | Admitting: Hematology and Oncology

## 2014-11-13 ENCOUNTER — Ambulatory Visit: Payer: Medicare Other | Admitting: Pharmacist

## 2014-11-13 ENCOUNTER — Telehealth: Payer: Self-pay | Admitting: *Deleted

## 2014-11-13 ENCOUNTER — Other Ambulatory Visit: Payer: Self-pay | Admitting: Hematology and Oncology

## 2014-11-13 DIAGNOSIS — I639 Cerebral infarction, unspecified: Secondary | ICD-10-CM

## 2014-11-13 DIAGNOSIS — I4821 Permanent atrial fibrillation: Secondary | ICD-10-CM

## 2014-11-13 DIAGNOSIS — D500 Iron deficiency anemia secondary to blood loss (chronic): Secondary | ICD-10-CM

## 2014-11-13 DIAGNOSIS — D75838 Other thrombocytosis: Secondary | ICD-10-CM | POA: Insufficient documentation

## 2014-11-13 DIAGNOSIS — R7989 Other specified abnormal findings of blood chemistry: Secondary | ICD-10-CM | POA: Insufficient documentation

## 2014-11-13 LAB — SEDIMENTATION RATE: SED RATE: 47 mm/h — AB (ref 0–30)

## 2014-11-13 NOTE — Assessment & Plan Note (Signed)
Her platelet count is high, likely reactive thrombocytosis to recent bleeding. We will monitored this closely.

## 2014-11-13 NOTE — Assessment & Plan Note (Signed)
This is likely anemia of chronic disease with mild chronic GI bleed. The patient denies recent history of bleeding such as epistaxis, hematuria or hematochezia. She is asymptomatic from the anemia.  To prevent disaster, I will proceed to give her IV iron this week and next week. She would need her blood work to be monitored closely. Her daughter has been taking her to [facility] for blood work monitoring. I recommend transfer of care to Dr. Marin Olp in the future. Hopefully, that will shorten her travel time and she can be treated locally.

## 2014-11-13 NOTE — Assessment & Plan Note (Signed)
Her INR is therapeutic. She has chronic intermittent GI bleed. She will continue followup at the Coumadin clinic. The goal of anticoagulation therapy is 2-3, duration of treatment for life. If the bleeding gets worse, we might have to discontinue Coumadin permanently.

## 2014-11-13 NOTE — Progress Notes (Signed)
[place], MD SUMMARY OF HEMATOLOGIC HISTORY:  CHIEF COMPLAINTS/PURPOSE OF VISIT:  Iron deficiency anemia from chronic GI bleed, arteriovenous malformation and nature fibrillation with history of stroke  HISTORY OF PRESENTING ILLNESS:  Angelica [name] was transferred to my care after her prior physician has left.  I reviewed the patient's records extensive and collaborated the history with the patient. Summary of her history is as follows: This is a pleasant lady with permanent atrial fibrillation, AV malformation and diastolic heart failure.  She is on full dose Coumadin anticoagulation due to a previous stroke. It was felt aspirin was contraindicated in view of her AVMs and GI bleeding. Her Coumadin is monitored through our office. She requires periodic parenteral iron infusions. Most recent infusion given on 12/20/2012. She has had a number of medical issues over the last 2 years. She was hospitalized one year ago in December 2012 with pneumonia and congestive heart failure. She was hospitalized in April 2013 with viral gastroenteritis/diarrhea. Hemoglobin dipped from her baseline of 11 g at that time and she was given parenteral iron. She was admitted in April 2014 with dyspnea and left flank pain. Evaluation revealed she was in heart failure. She had recent fall and fracture of her right arm. She has chronic rhinitis and uses a Proventil nebulizer and Astelin nasal spray as well as Pulmicort nebulizers. She is on chronic low-dose prednisone 5 mg daily. She has mild chronic hematuria and mild bleeding from the stool. She was on oral iron supplement twice a day. INTERVAL HISTORY: Angelica [name] 81 y.o. female returns for further follow-up, accompanied by her daughter. She continues to have intermittent GI bleed. She is not symptomatic. Recently, she had hypokalemia. She is on chronic potassium supplement.  I have reviewed the  past medical history, past surgical history, social history and family history with the patient and they are unchanged from previous note.  ALLERGIES:  is allergic to amoxicillin and penicillins.  MEDICATIONS:  Current Outpatient Prescriptions  Medication Sig Dispense Refill  . albuterol (PROVENTIL) (2.5 MG/3ML) 0.083% nebulizer solution Take 2.5 mg by nebulization every 6 (six) hours as needed for wheezing or shortness of breath.   5  . azelastine (ASTELIN) 0.1 % nasal spray   2  . budesonide (PULMICORT) 0.5 MG/2ML nebulizer solution Take 0.5 mg by nebulization 2 (two) times daily.   5  . diltiazem (DILACOR XR) 180 MG 24 hr capsule Take 180 mg by mouth daily before breakfast.     . ferrous sulfate 325 (65 FE) MG tablet Take 325 mg by mouth 2 (two) times daily with a meal.     . fluticasone (FLONASE) 50 MCG/ACT nasal spray Place 2 sprays into both nostrils 2 (two) times daily.    . furosemide (LASIX) 80 MG tablet Take 80 mg by mouth daily.     Marland Kitchen loratadine (ALLERGY) 10 MG tablet Take 10 mg by mouth daily.    . pantoprazole (PROTONIX) 40 MG tablet Take 40 mg by mouth daily.    . potassium chloride (K-DUR) 10 MEQ tablet Take 2 tablets (20 mEq total) by mouth daily. (Patient taking differently: Take 20 mEq by mouth 2 (two) times daily. ) 30 tablet 0  . predniSONE (DELTASONE) 5 MG tablet Take 10 mg by mouth every other day.     . warfarin (COUMADIN) 4 MG tablet Take 4 mg by mouth as directed. 4mg  once daily on Tuesday and Thursday.    . warfarin (COUMADIN) 6  MG tablet Take 6 mg by mouth as directed. 6mg  once daily on Monday, Wednesday, Friday, Saturday, and Sunday.     No current facility-administered medications for this visit.     REVIEW OF SYSTEMS:  Unreliable due to memory deficit All other systems were reviewed with the patient and are negative.  PHYSICAL EXAMINATION: ECOG PERFORMANCE STATUS: 2 - Symptomatic, <50% confined to bed  Filed Vitals:   11/12/14 1129  BP: 101/51  Pulse: 73   Temp: 98.2 F (36.8 C)  Resp: 17   Filed Weights   11/12/14 1129  Weight: 123 lb 12.8 oz (56.155 kg)    GENERAL:alert, no distress and comfortable. She looks pale and wheelchair-bound SKIN: skin color, texture, turgor are normal, no rashes or significant lesions EYES: normal, Conjunctiva are pink and non-injected, sclera clear Musculoskeletal:no cyanosis of digits and no clubbing  NEURO: alert & oriented x 3 with fluent speech, no focal motor/sensory deficits  LABORATORY DATA:  I have reviewed the data as listed Results for orders placed or performed in visit on 11/12/14 (from the past 48 hour(s))  CBC & Diff and Retic     Status: Abnormal   Collection Time: 11/12/14 11:06 AM  Result Value Ref Range   WBC 10.8 (H) 3.9 - 10.3 10e3/uL   NEUT# 8.8 (H) 1.5 - 6.5 10e3/uL   HGB 9.9 (L) 11.6 - 15.9 g/dL   HCT 32.5 (L) 34.8 - 46.6 %   Platelets 632 (H) 145 - 400 10e3/uL   MCV 87.1 79.5 - 101.0 fL   MCH 26.5 25.1 - 34.0 pg   MCHC 30.5 (L) 31.5 - 36.0 g/dL   RBC 3.73 3.70 - 5.45 10e6/uL   RDW 14.8 (H) 11.2 - 14.5 %   lymph# 0.6 (L) 0.9 - 3.3 10e3/uL   MONO# 1.0 (H) 0.1 - 0.9 10e3/uL   Eosinophils Absolute 0.5 0.0 - 0.5 10e3/uL   Basophils Absolute 0.0 0.0 - 0.1 10e3/uL   NEUT% 81.4 (H) 38.4 - 76.8 %   LYMPH% 5.2 (L) 14.0 - 49.7 %   MONO% 8.8 0.0 - 14.0 %   EOS% 4.3 0.0 - 7.0 %   BASO% 0.3 0.0 - 2.0 %   nRBC 0 0 - 0 %   Retic % 2.61 (H) 0.70 - 2.10 %   Retic Ct Abs 97.35 (H) 33.70 - 90.70 10e3/uL   Immature Retic Fract 17.10 (H) 1.60 - 10.00 %  Hold Tube, Blood Bank     Status: None   Collection Time: 11/12/14 11:06 AM  Result Value Ref Range   Hold Tube, Blood Bank Blood Bank Order Cancelled   Sedimentation rate     Status: Abnormal   Collection Time: 11/12/14 11:06 AM  Result Value Ref Range   Sed Rate 47 (H) 0 - 30 mm/hr  Prothrombin Time     Status: Abnormal   Collection Time: 11/12/14 12:17 PM  Result Value Ref Range   Prothrombin Time 30.0 (H) 11.6 - 15.2 seconds    INR 2.86 (H) <1.50    Comment: The INR is of principal utility in following patients on stable dosesof oral anticoagulants.  The therapeutic range is generally 2.0 to3.0, but may be 3.0 to 4.0 in patients with mechanical cardiac valves,recurrent embolisms and antiphospholipid  antibodies (including lupusinhibitors).   Potassium     Status: None   Collection Time: 11/12/14 12:17 PM  Result Value Ref Range   Potassium 3.7 3.5 - 5.1 mEq/L    Lab Results  Component  Value Date   WBC 10.8* 11/12/2014   HGB 9.9* 11/12/2014   HCT 32.5* 11/12/2014   MCV 87.1 11/12/2014   PLT 632* 11/12/2014    ASSESSMENT & PLAN:  Iron deficiency anemia due to chronic blood loss This is likely anemia of chronic disease with mild chronic GI bleed. The patient denies recent history of bleeding such as epistaxis, hematuria or hematochezia. She is asymptomatic from the anemia.  To prevent disaster, I will proceed to give her IV iron this week and next week. She would need her blood work to be monitored closely. Her daughter has been taking her to [facility] for blood work monitoring. I recommend transfer of care to Dr. Marin Olp in the future. Hopefully, that will shorten her travel time and she can be treated locally.    Atrial fibrillation, permanent, rate controlled on coumadin Her INR is therapeutic. She has chronic intermittent GI bleed. She will continue followup at the Coumadin clinic. The goal of anticoagulation therapy is 2-3, duration of treatment for life. If the bleeding gets worse, we might have to discontinue Coumadin permanently.    Hypokalemia She had recurrent hypokalemia related to her medication side effect. Per patient daughter request, I recheck a potassium level today and was normal   Reactive thrombocytosis Her platelet count is high, likely reactive thrombocytosis to recent bleeding. We will monitored this closely.    All questions were answered. The patient  knows to call the clinic with any problems, questions or concerns. No barriers to learning was detected.  I spent 15 minutes counseling the patient face to face. The total time spent in the appointment was 20 minutes and more than 50% was on counseling.     [facility], Kayveon Lennartz, MD 5/12/201610:49 AM

## 2014-11-13 NOTE — Patient Instructions (Signed)
INR above goal Decrease coumadin slightly to 4 mg daily with 6mg  on Sundays only  Repeat INR on 12/03/14 at [facility] at 10:30am. We will call you with the results

## 2014-11-13 NOTE — Progress Notes (Signed)
*  Telephone encounter - No Charge* INR above goal today at 2.86 (goal 1.8-2.3) Pt had INR drawn on 5/11 which was sent to [facility] labs and resulted today (5/12) Spoke to Gardiner Barefoot over the phone who states that Angelica [name] continues to slowly decline with lower appetite and energy levels  INR continues to be elevated No other issues or complaints No other diet or medication changes No unusual bleeding or bruising No missed or extra doses Will continue to decrease dose Plan: Decrease coumadin slightly to 4 mg daily with 6mg  on Sundays only   Repeat INR on 12/03/14 at [facility] at 10:30am. We will call you with the results

## 2014-11-13 NOTE — Assessment & Plan Note (Signed)
She had recurrent hypokalemia related to her medication side effect. Per patient daughter request, I recheck a potassium level today and was normal

## 2014-11-13 NOTE — Telephone Encounter (Signed)
Patient's daughter called and left message to change her appt. I have called her back and moved appt to Friday morning. Daughter aware.    JMW

## 2014-11-18 ENCOUNTER — Telehealth: Payer: Self-pay | Admitting: Hematology and Oncology

## 2014-11-18 ENCOUNTER — Telehealth: Payer: Self-pay | Admitting: Medical Oncology

## 2014-11-18 NOTE — Telephone Encounter (Signed)
Wants to know if she should get iron infusion this week as scheduled. Call transferred to [place].

## 2014-11-18 NOTE — Telephone Encounter (Signed)
It is highly unusual to have diarrhea with IV iron Due to her age, I recommend holding off further iv iron infusion for now Recommend she takes imodium as needed

## 2014-11-18 NOTE — Telephone Encounter (Signed)
Per pof iron appointment has been cancelled and per pof daughter is aware

## 2014-11-18 NOTE — Telephone Encounter (Signed)
Daughter notified of Gorsuch's recommended.

## 2014-11-18 NOTE — Telephone Encounter (Signed)
VM from pt's daughter reporting pt has loss of appetite, weak, diarrhea w/ abd cramping since she got Iron infusion last week on 5/11.   Pt scheduled for second part of iron this Friday 5/20.  Daughter asks if it is wise to proceed w/ iron again given these side effects?

## 2014-11-19 ENCOUNTER — Ambulatory Visit: Payer: PRIVATE HEALTH INSURANCE

## 2014-11-19 ENCOUNTER — Encounter: Payer: Self-pay | Admitting: Skilled Nursing Facility1

## 2014-11-19 NOTE — Progress Notes (Signed)
Subjective:     Patient ID: Angelica [name], female   DOB: 17-Nov-1922, 82 y.o.   MRN: [id number]  HPI   Review of Systems     Objective:   Physical Exam To assist the pt in identifying some dietary strategies in gaining some lost wt back.    Assessment:     Pt identified as being malnourished due to lost wt. Pt was contacted via the telephone at [phone]. Pt was unavailable.    Plan:     Dietitian left a voicemail prompting the pt to contact Ernestene Kiel RD,CSO,LDN at her earliest convenience.Marland Kitchen

## 2014-11-21 ENCOUNTER — Ambulatory Visit: Payer: PRIVATE HEALTH INSURANCE

## 2014-11-25 ENCOUNTER — Other Ambulatory Visit: Payer: PRIVATE HEALTH INSURANCE

## 2014-11-29 ENCOUNTER — Emergency Department (HOSPITAL_COMMUNITY): Payer: Medicare Other

## 2014-11-29 ENCOUNTER — Encounter (HOSPITAL_COMMUNITY): Payer: Self-pay

## 2014-11-29 ENCOUNTER — Inpatient Hospital Stay (HOSPITAL_COMMUNITY)
Admission: EM | Admit: 2014-11-29 | Discharge: 2014-12-03 | DRG: 871 | Disposition: A | Discharge status: Skilled Nursing Facility | Payer: Medicare Other | Attending: Internal Medicine | Admitting: Internal Medicine

## 2014-11-29 DIAGNOSIS — F0390 Unspecified dementia without behavioral disturbance: Secondary | ICD-10-CM | POA: Diagnosis present

## 2014-11-29 DIAGNOSIS — R627 Adult failure to thrive: Secondary | ICD-10-CM | POA: Diagnosis present

## 2014-11-29 DIAGNOSIS — J449 Chronic obstructive pulmonary disease, unspecified: Secondary | ICD-10-CM | POA: Diagnosis present

## 2014-11-29 DIAGNOSIS — Z7951 Long term (current) use of inhaled steroids: Secondary | ICD-10-CM | POA: Diagnosis not present

## 2014-11-29 DIAGNOSIS — E876 Hypokalemia: Secondary | ICD-10-CM | POA: Diagnosis not present

## 2014-11-29 DIAGNOSIS — M1990 Unspecified osteoarthritis, unspecified site: Secondary | ICD-10-CM | POA: Diagnosis present

## 2014-11-29 DIAGNOSIS — N183 Chronic kidney disease, stage 3 (moderate): Secondary | ICD-10-CM | POA: Diagnosis present

## 2014-11-29 DIAGNOSIS — Z8719 Personal history of other diseases of the digestive system: Secondary | ICD-10-CM

## 2014-11-29 DIAGNOSIS — Z8673 Personal history of transient ischemic attack (TIA), and cerebral infarction without residual deficits: Secondary | ICD-10-CM | POA: Diagnosis not present

## 2014-11-29 DIAGNOSIS — Z9849 Cataract extraction status, unspecified eye: Secondary | ICD-10-CM | POA: Diagnosis not present

## 2014-11-29 DIAGNOSIS — Z882 Allergy status to sulfonamides status: Secondary | ICD-10-CM

## 2014-11-29 DIAGNOSIS — I129 Hypertensive chronic kidney disease with stage 1 through stage 4 chronic kidney disease, or unspecified chronic kidney disease: Secondary | ICD-10-CM | POA: Diagnosis present

## 2014-11-29 DIAGNOSIS — Z7401 Bed confinement status: Secondary | ICD-10-CM | POA: Diagnosis not present

## 2014-11-29 DIAGNOSIS — N179 Acute kidney failure, unspecified: Secondary | ICD-10-CM | POA: Diagnosis present

## 2014-11-29 DIAGNOSIS — R532 Functional quadriplegia: Secondary | ICD-10-CM | POA: Diagnosis present

## 2014-11-29 DIAGNOSIS — R4701 Aphasia: Secondary | ICD-10-CM | POA: Diagnosis present

## 2014-11-29 DIAGNOSIS — K219 Gastro-esophageal reflux disease without esophagitis: Secondary | ICD-10-CM | POA: Diagnosis present

## 2014-11-29 DIAGNOSIS — D62 Acute posthemorrhagic anemia: Secondary | ICD-10-CM

## 2014-11-29 DIAGNOSIS — Z6820 Body mass index (BMI) 20.0-20.9, adult: Secondary | ICD-10-CM | POA: Diagnosis not present

## 2014-11-29 DIAGNOSIS — Z9049 Acquired absence of other specified parts of digestive tract: Secondary | ICD-10-CM | POA: Diagnosis present

## 2014-11-29 DIAGNOSIS — D638 Anemia in other chronic diseases classified elsewhere: Secondary | ICD-10-CM | POA: Diagnosis present

## 2014-11-29 DIAGNOSIS — I482 Chronic atrial fibrillation: Secondary | ICD-10-CM | POA: Diagnosis present

## 2014-11-29 DIAGNOSIS — A419 Sepsis, unspecified organism: Secondary | ICD-10-CM | POA: Diagnosis not present

## 2014-11-29 DIAGNOSIS — I5032 Chronic diastolic (congestive) heart failure: Secondary | ICD-10-CM | POA: Diagnosis present

## 2014-11-29 DIAGNOSIS — Z880 Allergy status to penicillin: Secondary | ICD-10-CM

## 2014-11-29 DIAGNOSIS — R197 Diarrhea, unspecified: Secondary | ICD-10-CM | POA: Diagnosis present

## 2014-11-29 DIAGNOSIS — D649 Anemia, unspecified: Secondary | ICD-10-CM

## 2014-11-29 DIAGNOSIS — N390 Urinary tract infection, site not specified: Secondary | ICD-10-CM | POA: Diagnosis present

## 2014-11-29 DIAGNOSIS — E860 Dehydration: Secondary | ICD-10-CM | POA: Diagnosis present

## 2014-11-29 DIAGNOSIS — T501X5A Adverse effect of loop [high-ceiling] diuretics, initial encounter: Secondary | ICD-10-CM | POA: Diagnosis not present

## 2014-11-29 DIAGNOSIS — E43 Unspecified severe protein-calorie malnutrition: Secondary | ICD-10-CM | POA: Diagnosis present

## 2014-11-29 DIAGNOSIS — Z7901 Long term (current) use of anticoagulants: Secondary | ICD-10-CM

## 2014-11-29 DIAGNOSIS — R791 Abnormal coagulation profile: Secondary | ICD-10-CM

## 2014-11-29 DIAGNOSIS — R531 Weakness: Secondary | ICD-10-CM | POA: Diagnosis present

## 2014-11-29 LAB — URINALYSIS, ROUTINE W REFLEX MICROSCOPIC
Bilirubin Urine: NEGATIVE
Glucose, UA: NEGATIVE mg/dL
Ketones, ur: NEGATIVE mg/dL
NITRITE: NEGATIVE
PROTEIN: NEGATIVE mg/dL
Specific Gravity, Urine: 1.018 (ref 1.005–1.030)
UROBILINOGEN UA: 0.2 mg/dL (ref 0.0–1.0)
pH: 5.5 (ref 5.0–8.0)

## 2014-11-29 LAB — COMPREHENSIVE METABOLIC PANEL
ALT: 12 U/L — AB (ref 14–54)
AST: 13 U/L — ABNORMAL LOW (ref 15–41)
Albumin: 2.2 g/dL — ABNORMAL LOW (ref 3.5–5.0)
Alkaline Phosphatase: 160 U/L — ABNORMAL HIGH (ref 38–126)
Anion gap: 12 (ref 5–15)
BUN: 29 mg/dL — AB (ref 6–20)
CO2: 26 mmol/L (ref 22–32)
CREATININE: 2.06 mg/dL — AB (ref 0.44–1.00)
Calcium: 8.5 mg/dL — ABNORMAL LOW (ref 8.9–10.3)
Chloride: 101 mmol/L (ref 101–111)
GFR calc Af Amer: 23 mL/min — ABNORMAL LOW (ref >60)
GFR calc non Af Amer: 20 mL/min — ABNORMAL LOW (ref >60)
Glucose, Bld: 119 mg/dL — ABNORMAL HIGH (ref 65–99)
Potassium: 3.8 mmol/L (ref 3.5–5.1)
SODIUM: 139 mmol/L (ref 135–145)
Total Bilirubin: 0.4 mg/dL (ref 0.3–1.2)
Total Protein: 5.1 g/dL — ABNORMAL LOW (ref 6.5–8.1)

## 2014-11-29 LAB — CBC WITH DIFFERENTIAL/PLATELET
Basophils Absolute: 0 10*3/uL (ref 0.0–0.1)
Basophils Relative: 0 % (ref 0–1)
Eosinophils Absolute: 0 10*3/uL (ref 0.0–0.7)
Eosinophils Relative: 0 % (ref 0–5)
HEMATOCRIT: 27.6 % — AB (ref 36.0–46.0)
Hemoglobin: 8.2 g/dL — ABNORMAL LOW (ref 12.0–15.0)
LYMPHS ABS: 0.6 10*3/uL — AB (ref 0.7–4.0)
Lymphocytes Relative: 5 % — ABNORMAL LOW (ref 12–46)
MCH: 24.8 pg — AB (ref 26.0–34.0)
MCHC: 29.7 g/dL — ABNORMAL LOW (ref 30.0–36.0)
MCV: 83.6 fL (ref 78.0–100.0)
MONO ABS: 0.9 10*3/uL (ref 0.1–1.0)
Monocytes Relative: 8 % (ref 3–12)
NEUTROS PCT: 87 % — AB (ref 43–77)
Neutro Abs: 9.5 10*3/uL — ABNORMAL HIGH (ref 1.7–7.7)
PLATELETS: 606 10*3/uL — AB (ref 150–400)
RBC: 3.3 MIL/uL — AB (ref 3.87–5.11)
RDW: 15.1 % (ref 11.5–15.5)
WBC: 11 10*3/uL — ABNORMAL HIGH (ref 4.0–10.5)

## 2014-11-29 LAB — PREPARE RBC (CROSSMATCH)

## 2014-11-29 LAB — LIPASE, BLOOD: Lipase: 14 U/L — ABNORMAL LOW (ref 22–51)

## 2014-11-29 LAB — PROTIME-INR
INR: 5.83 — AB (ref 0.00–1.49)
Prothrombin Time: 50.5 seconds — ABNORMAL HIGH (ref 11.6–15.2)

## 2014-11-29 LAB — URINE MICROSCOPIC-ADD ON

## 2014-11-29 LAB — TROPONIN I: Troponin I: <0.03 ng/mL (ref <0.031)

## 2014-11-29 MED ORDER — FERROUS SULFATE 325 (65 FE) MG PO TABS
325.0000 mg | ORAL_TABLET | Freq: Two times a day (BID) | ORAL | Status: DC
  Administered 2014-11-29 – 2014-12-03 (×8): 325 mg via ORAL
  Filled 2014-11-29 (×10): qty 1

## 2014-11-29 MED ORDER — LORATADINE 10 MG PO TABS
10.0000 mg | ORAL_TABLET | Freq: Every day | ORAL | Status: DC
  Administered 2014-11-30 – 2014-12-03 (×4): 10 mg via ORAL
  Filled 2014-11-29 (×4): qty 1

## 2014-11-29 MED ORDER — PANTOPRAZOLE SODIUM 40 MG PO TBEC
40.0000 mg | DELAYED_RELEASE_TABLET | Freq: Every day | ORAL | Status: DC
Start: 2014-11-29 — End: 2014-12-03
  Administered 2014-11-29 – 2014-12-03 (×5): 40 mg via ORAL
  Filled 2014-11-29 (×5): qty 1

## 2014-11-29 MED ORDER — PREDNISONE 10 MG PO TABS: 10.0000 mg | ORAL_TABLET | ORAL | Status: DC

## 2014-11-29 MED ORDER — FLUTICASONE PROPIONATE 50 MCG/ACT NA SUSP
2.0000 | Freq: Two times a day (BID) | NASAL | Status: DC | PRN
  Filled 2014-11-29: qty 16

## 2014-11-29 MED ORDER — ONDANSETRON HCL 4 MG/2ML IJ SOLN: 4.0000 mg | Freq: Four times a day (QID) | INTRAMUSCULAR | Status: DC | PRN

## 2014-11-29 MED ORDER — LEVOFLOXACIN IN D5W 500 MG/100ML IV SOLN
500.0000 mg | Freq: Once | INTRAVENOUS | Status: AC
  Administered 2014-11-29: 500 mg via INTRAVENOUS
  Filled 2014-11-29: qty 100

## 2014-11-29 MED ORDER — ENSURE ENLIVE PO LIQD
237.0000 mL | Freq: Two times a day (BID) | ORAL | Status: DC
  Administered 2014-11-30 – 2014-12-03 (×7): 237 mL via ORAL

## 2014-11-29 MED ORDER — SODIUM CHLORIDE 0.9 % IV SOLN
INTRAVENOUS | Status: DC
  Administered 2014-11-29 – 2014-12-01 (×3): via INTRAVENOUS

## 2014-11-29 MED ORDER — POTASSIUM CHLORIDE ER 10 MEQ PO TBCR
20.0000 meq | EXTENDED_RELEASE_TABLET | Freq: Every day | ORAL | Status: DC
  Administered 2014-11-29 – 2014-12-03 (×5): 20 meq via ORAL
  Filled 2014-11-29 (×5): qty 2

## 2014-11-29 MED ORDER — BUDESONIDE 0.5 MG/2ML IN SUSP
0.5000 mg | Freq: Two times a day (BID) | RESPIRATORY_TRACT | Status: DC
  Administered 2014-11-29 – 2014-12-03 (×8): 0.5 mg via RESPIRATORY_TRACT
  Filled 2014-11-29 (×8): qty 2

## 2014-11-29 MED ORDER — GUAIFENESIN ER 600 MG PO TB12: 1200.0000 mg | ORAL_TABLET | Freq: Two times a day (BID) | ORAL | Status: DC | PRN

## 2014-11-29 MED ORDER — SODIUM CHLORIDE 0.9 % IV SOLN: INTRAVENOUS | Status: DC

## 2014-11-29 MED ORDER — PHYTONADIONE 5 MG PO TABS
5.0000 mg | ORAL_TABLET | Freq: Every day | ORAL | Status: AC
  Administered 2014-11-29 – 2014-12-01 (×3): 5 mg via ORAL
  Filled 2014-11-29 (×3): qty 1

## 2014-11-29 MED ORDER — ALBUTEROL SULFATE (2.5 MG/3ML) 0.083% IN NEBU: 2.5000 mg | INHALATION_SOLUTION | Freq: Four times a day (QID) | RESPIRATORY_TRACT | Status: DC | PRN

## 2014-11-29 MED ORDER — SODIUM CHLORIDE 0.9 % IV BOLUS (SEPSIS)
500.0000 mL | Freq: Once | INTRAVENOUS | Status: AC
  Administered 2014-11-29: 500 mL via INTRAVENOUS

## 2014-11-29 MED ORDER — PREDNISONE 10 MG PO TABS
10.0000 mg | ORAL_TABLET | ORAL | Status: DC
  Administered 2014-12-01 – 2014-12-03 (×2): 10 mg via ORAL
  Filled 2014-11-29 (×2): qty 1

## 2014-11-29 NOTE — ED Notes (Signed)
EKG given to Dr. Regenia Skeeter. Tim, RN aware.

## 2014-11-29 NOTE — ED Notes (Signed)
I&O cath not done, pt was able to void

## 2014-11-29 NOTE — ED Notes (Signed)
Bed: AJ28 Expected date: 11/29/14 Expected time: 10:42 AM Means of arrival: Ambulance Comments: General weakness

## 2014-11-29 NOTE — ED Provider Notes (Signed)
CSN: [id number]     Arrival date & time 11/29/14  1048 History   First MD Initiated Contact with Patient 11/29/14 1051     Chief Complaint  Patient presents with  . Diarrhea     (Consider location/radiation/quality/duration/timing/severity/associated sxs/prior Treatment) HPI Patient does not give specific complaints. She states she doesn't feel good. She endorses generalized weakness. She denies any localizing pain. The nursing home report states diarrheal stool daily but the patient doesn't endorse diarrhea. She denies vomiting but does endorse loss of appetite and decreased oral intake. She is not aware of any fevers. She denies chest pain or shortness of breath. Reportedly by nursing home staff to EMS, the patient has had these symptoms with evaluation and no diagnosis. Past Medical History  Diagnosis Date  . Atrial fibrillation   . Hypertension   . Arthritis   . GERD (gastroesophageal reflux disease)   . COPD (chronic obstructive pulmonary disease)   . PNA (pneumonia)   . Anemia due to GI blood loss 07/11/2011  . 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 Stroke 2009    aphasia and memory impairment as residual  . Hypoxia   . Atrial fibrillation, permanent, rate controlled on coumadin 05/10/2011  . Hypogammaglobulinemia 06/01/2012    Immunoglobulins 05/01/2012-normal IgG M., normal IgA,  low IgG 344([phone]).   . 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 CVA history 05/10/2011  . CHF (congestive heart failure) 96/75/9163    diastolic  . History of nuclear stress test 2009    normal dobutamine myoview   Past Surgical History  Procedure Laterality Date  . Hip fracture surgery    . Cholecystectomy    . Knee surgery    . Cataract extraction    . Transthoracic echocardiogram  06/2011    EF 55-60%; mild LVH; LA & RA mod dilated; mod TR   Family History  Problem Relation Age of Onset  . Heart  disease Mother   . Rectal cancer Mother   . Leukemia Father   . Heart failure Mother   . Stroke Mother   . Cancer Maternal Grandmother   . Hypertension Son   . Hypertension Daughter    History  Substance Use Topics  . Smoking status: Never Smoker   . Smokeless tobacco: Never Used  . Alcohol Use: No   OB History    No data available     Review of Systems  10 Systems reviewed and are negative for acute change except as noted in the HPI.  Allergies  Amoxicillin and Penicillins  Home Medications   Prior to Admission medications   Medication Sig Start Date End Date Taking? Authorizing Provider  albuterol (PROVENTIL) (2.5 MG/3ML) 0.083% nebulizer solution Take 2.5 mg by nebulization every 6 (six) hours as needed for wheezing or shortness of breath.  07/14/14  Yes Historical Provider, MD  budesonide (PULMICORT) 0.5 MG/2ML nebulizer solution Take 0.5 mg by nebulization 2 (two) times daily.  06/30/14  Yes Historical Provider, MD  diltiazem (DILACOR XR) 180 MG 24 hr capsule Take 180 mg by mouth daily before breakfast.    Yes Historical Provider, MD  ferrous sulfate 325 (65 FE) MG tablet Take 325 mg by mouth 2 (two) times daily with a meal.    Yes Historical Provider, MD  fluticasone (FLONASE) 50 MCG/ACT nasal spray Place 2 sprays into both nostrils 2 (two) times daily as needed for allergies.    Yes Historical Provider, MD  furosemide (LASIX) 80 MG tablet Take 80 mg by mouth daily.    Yes Historical Provider, MD  guaiFENesin (MUCINEX) 600 MG 12 hr tablet Take 1,200 mg by mouth 2 (two) times daily as needed for cough or to loosen phlegm.   Yes Historical Provider, MD  loratadine (ALLERGY) 10 MG tablet Take 10 mg by mouth daily.   Yes Historical Provider, MD  pantoprazole (PROTONIX) 40 MG tablet Take 40 mg by mouth daily.   Yes Historical Provider, MD  potassium chloride (K-DUR) 10 MEQ tablet Take 2 tablets (20 mEq total) by mouth daily. 04/18/14  Yes Kelvin Cellar, MD  predniSONE  (DELTASONE) 5 MG tablet Take 10 mg by mouth every other day.    Yes Historical Provider, MD  warfarin (COUMADIN) 4 MG tablet Take 4-6 mg by mouth as directed. Patient takes 4mg  once daily on Monday -Saturday and takes 6 mg on Sunday   Yes Historical Provider, MD   BP 90/45 mmHg  Pulse 89  Temp(Src) 97.5 F (36.4 C) (Oral)  Resp 18  Ht 5\' 4"  (1.626 m)  Wt 122 lb 11.2 oz (55.656 kg)  BMI 21.05 kg/m2  SpO2 99% Physical Exam  Constitutional: She appears well-developed and well-nourished.  The patient has slightly deconditioned appearance but is well-nourished and well-developed. She is mildly pale in appearance. She has no respiratory distress. The patient has alert appearance and answers questions.  HENT:  Head: Normocephalic and atraumatic.  Mouth/Throat: Oropharynx is clear and moist.  Eyes: EOM are normal. Pupils are equal, round, and reactive to light.  Neck: Neck supple.  Cardiovascular: Intact distal pulses.   Irregularly irregular. 2\6 systolic ejection murmur.  Pulmonary/Chest: Effort normal and breath sounds normal.  Abdominal: Soft. Bowel sounds are normal. She exhibits no distension. There is no tenderness.  Musculoskeletal: Normal range of motion. She exhibits no edema or tenderness.  Neurological: She is alert. No cranial nerve deficit. She exhibits normal muscle tone. Coordination normal.  Skin: Skin is warm and dry. There is pallor.  Psychiatric:  Patient has a flat affect. She does answer questions but to many questions reports she doesn't know.    ED Course  Procedures (including critical care time) Labs Review Labs Reviewed  COMPREHENSIVE METABOLIC PANEL - Abnormal; Notable for the following:    Glucose, Bld 119 (*)    BUN 29 (*)    Creatinine, Ser 2.06 (*)    Calcium 8.5 (*)    Total Protein 5.1 (*)    Albumin 2.2 (*)    AST 13 (*)    ALT 12 (*)    Alkaline Phosphatase 160 (*)    GFR calc non Af Amer 20 (*)    GFR calc Af Amer 23 (*)    All other  components within normal limits  LIPASE, BLOOD - Abnormal; Notable for the following:    Lipase 14 (*)    All other components within normal limits  CBC WITH DIFFERENTIAL/PLATELET - Abnormal; Notable for the following:    WBC 11.0 (*)    RBC 3.30 (*)    Hemoglobin 8.2 (*)    HCT 27.6 (*)    MCH 24.8 (*)    MCHC 29.7 (*)    Platelets 606 (*)    Neutrophils Relative % 87 (*)    Neutro Abs 9.5 (*)    Lymphocytes Relative 5 (*)    Lymphs Abs 0.6 (*)    All other components within normal limits  PROTIME-INR - Abnormal; Notable for the following:  Prothrombin Time 50.5 (*)    INR 5.83 (*)    All other components within normal limits  URINALYSIS, ROUTINE W REFLEX MICROSCOPIC (NOT AT [facility]) - Abnormal; Notable for the following:    APPearance TURBID (*)    Hgb urine dipstick MODERATE (*)    Leukocytes, UA LARGE (*)    All other components within normal limits  URINE MICROSCOPIC-ADD ON - Abnormal; Notable for the following:    Squamous Epithelial / LPF FEW (*)    Bacteria, UA MANY (*)    All other components within normal limits  COMPREHENSIVE METABOLIC PANEL - Abnormal; Notable for the following:    BUN 24 (*)    Creatinine, Ser 1.75 (*)    Calcium 7.7 (*)    Total Protein 4.0 (*)    Albumin 1.8 (*)    AST 14 (*)    ALT 9 (*)    GFR calc non Af Amer 25 (*)    GFR calc Af Amer 29 (*)    All other components within normal limits  CBC - Abnormal; Notable for the following:    RBC 3.27 (*)    Hemoglobin 8.4 (*)    HCT 26.8 (*)    MCH 25.7 (*)    All other components within normal limits  PROTIME-INR - Abnormal; Notable for the following:    Prothrombin Time 32.2 (*)    INR 3.21 (*)    All other components within normal limits  URINE CULTURE  CLOSTRIDIUM DIFFICILE BY PCR (NOT AT [facility])  TROPONIN I  OCCULT BLOOD X 1 CARD TO LAB, STOOL  TYPE AND SCREEN  PREPARE RBC (CROSSMATCH)    Imaging Review Dg Abd Acute W/chest  11/29/2014   CLINICAL DATA:  Lethargy, lack of  appetite nausea vomiting diarrhea  EXAM: DG ABDOMEN ACUTE W/ 1V CHEST  COMPARISON:  10/13/2014  FINDINGS: Stable mild cardiac enlargement. Uncoiling and calcification of the aorta. Vascular pattern normal. Lungs clear. No free air. Left shoulder arthritis.  No pneumoperitoneum identified on decubitus film. No abnormally dilated loops of bowel. No abnormal air-fluid levels. No abnormal opacities with nonspecific calcifications observed diffusely over the abdomen.  IMPRESSION: No acute abnormalities in the thorax or abdomen.   Electronically Signed   By: Skipper Cliche M.D.   On: 11/29/2014 12:05     EKG Interpretation   Date/Time:  Saturday Nov 29 2014 11:44:05 EDT Ventricular Rate:  83 PR Interval:    QRS Duration: 120 QT Interval:  374 QTC Calculation: 439 R Axis:   96 Text Interpretation:  Atrial fibrillation IVCD, consider atypical RBBB ST  depr, consider ischemia, anterior leads Baseline wander in lead(s) V1  agree. similar to old.  Confirmed by Johnney Killian, MD, Jeannie Done [phone]) on  11/29/2014 12:17:07 PM     CRITICAL CARE Performed by: Charlesetta Shanks   Total critical care time: 30  Critical care time was exclusive of separately billable procedures and treating other patients.  Critical care was necessary to treat or prevent imminent or life-threatening deterioration.  Critical care was time spent personally by me on the following activities: development of treatment plan with patient and/or surrogate as well as nursing, discussions with consultants, evaluation of patient's response to treatment, examination of patient, obtaining history from patient or surrogate, ordering and performing treatments and interventions, ordering and review of laboratory studies, ordering and review of radiographic studies, pulse oximetry and re-evaluation of patient's condition. MDM   Final diagnoses:  UTI (lower urinary tract infection)  Anemia, unspecified  anemia type  Dehydration  History of GI  bleed   After workup had been initiated, the patient's daughter arrived for additional history. The daughter reported that she had had significantly decreased activity level and been spending much of her time in bed. She stated this was atypical for her. Her daughter's concerns were for dehydration and anemia. Her daughter reports that she keeps her INR very closely monitored and has been able to maintain it in the 2 range. Her daughter states she chronically has a small amount of GI bleeding from her AVM but if her INR gets higher then she tends to get more anemic. Several etiologies have been identified for the patient's symptoms. Urine is grossly positive, the patient does show renal insufficiency\dehydration and she does have anemia although at this point she is still greater than 8 however with rehydration and INR of 5 and she is certainly expected to have lower hemoglobin levels. This was reviewed with the hospitalist and at this point time transfusion will be ordered as well. The patient will be admitted for ongoing management of multiple medical problems.    Charlesetta Shanks, MD 11/30/14 [phone]

## 2014-11-29 NOTE — ED Notes (Signed)
Staff at [facility] noted pt. To be having two to three diarrhea stools per day for some time now; plus pt. Feels generally "weak".  They also told EMS that pt. Has been seen for this symjtomology in the past with no clear dx/resolution.  She arrives in no distress and denis any pain or discomfort.

## 2014-11-29 NOTE — ED Notes (Signed)
Nurse currently starting IV 

## 2014-11-29 NOTE — H&P (Signed)
History and Physical  Angelica [name] YIR:[id number] DOB: July 05, 1922 DOA: 11/29/2014  Referring physician: EDP PCP: [name], MD   Chief Complaint: weakness  HPI: Angelica [name] is a 81 y.o. female   Was sent to ER from ALF due to progressive weakness, patient has expressive aphasia, not able to provide history, hpi obtained from talking to daughter who is very involved with patient's care and chart review. Per daughter, patient has been having progressive decline, with weight loss of 15pounds over the last years, decreased appetite, patient used to be able to walk with a walker, but last two weeks has been mostly in bed due to generalized weakness, she developed diarrhea this morning, she did not want to eat, patient was brought to the ED.  ED course: borderline bp, dehydrated, hgb8, supertherapeutic INR, uti, arf, she received hydration, levaquin, 1unit prbc ordered, hospitalist called  For admission.  Patient currently denies pain, does follow commend and pleasant.  Review of Systems:  Detail per HPI, Review of systems are otherwise negative  Past Medical History  Diagnosis Date  . Atrial fibrillation   . Hypertension   . Arthritis   . GERD (gastroesophageal reflux disease)   . COPD (chronic obstructive pulmonary disease)   . PNA (pneumonia)   . Anemia due to GI blood loss 07/11/2011  . 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 Stroke 2009    aphasia and memory impairment as residual  . Hypoxia   . Atrial fibrillation, permanent, rate controlled on coumadin 05/10/2011  . Hypogammaglobulinemia 06/01/2012    Immunoglobulins 05/01/2012-normal IgG M., normal IgA,  low IgG 344([phone]).   . 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 CVA history 05/10/2011  . CHF (congestive heart failure) 50/03/3817    diastolic  . History of nuclear stress test 2009    normal dobutamine myoview   Past  Surgical History  Procedure Laterality Date  . Hip fracture surgery    . Cholecystectomy    . Knee surgery    . Cataract extraction    . Transthoracic echocardiogram  06/2011    EF 55-60%; mild LVH; LA & RA mod dilated; mod TR   Social History:  reports that she has never smoked. She has never used smokeless tobacco. She reports that she does not drink alcohol or use illicit drugs. Patient lives at [facility] & is able to participate in activities of daily living with a walker  Allergies  Allergen Reactions  . Amoxicillin Hives  . Penicillins Hives    Family History  Problem Relation Age of Onset  . Heart disease Mother   . Rectal cancer Mother   . Leukemia Father   . Heart failure Mother   . Stroke Mother   . Cancer Maternal Grandmother   . Hypertension Son   . Hypertension Daughter       Prior to Admission medications   Medication Sig Start Date End Date Taking? Authorizing Provider  albuterol (PROVENTIL) (2.5 MG/3ML) 0.083% nebulizer solution Take 2.5 mg by nebulization every 6 (six) hours as needed for wheezing or shortness of breath.  07/14/14  Yes Historical Provider, MD  budesonide (PULMICORT) 0.5 MG/2ML nebulizer solution Take 0.5 mg by nebulization 2 (two) times daily.  06/30/14  Yes Historical Provider, MD  diltiazem (DILACOR XR) 180 MG 24 hr capsule Take 180 mg by mouth daily before breakfast.    Yes Historical Provider, MD  ferrous sulfate 325 (65 FE) MG tablet Take 325  mg by mouth 2 (two) times daily with a meal.    Yes Historical Provider, MD  fluticasone (FLONASE) 50 MCG/ACT nasal spray Place 2 sprays into both nostrils 2 (two) times daily as needed for allergies.    Yes Historical Provider, MD  furosemide (LASIX) 80 MG tablet Take 80 mg by mouth daily.    Yes Historical Provider, MD  guaiFENesin (MUCINEX) 600 MG 12 hr tablet Take 1,200 mg by mouth 2 (two) times daily as needed for cough or to loosen phlegm.   Yes Historical Provider, MD  loratadine (ALLERGY) 10 MG  tablet Take 10 mg by mouth daily.   Yes Historical Provider, MD  pantoprazole (PROTONIX) 40 MG tablet Take 40 mg by mouth daily.   Yes Historical Provider, MD  potassium chloride (K-DUR) 10 MEQ tablet Take 2 tablets (20 mEq total) by mouth daily. 04/18/14  Yes Kelvin Cellar, MD  predniSONE (DELTASONE) 5 MG tablet Take 10 mg by mouth every other day.    Yes Historical Provider, MD  warfarin (COUMADIN) 4 MG tablet Take 4-6 mg by mouth as directed. Patient takes 4mg  once daily on Monday -Saturday and takes 6 mg on Sunday   Yes Historical Provider, MD    Physical Exam: BP 98/52 mmHg  Pulse 86  Temp(Src) 98.6 F (37 C) (Oral)  Resp 26  Ht 5\' 4"  (1.626 m)  Wt 54.5 kg (120 lb 2.4 oz)  BMI 20.61 kg/m2  SpO2 100%  General:  frail Eyes: PERRL ENT: unremarkable Neck: supple, no JVD Cardiovascular: IRRR Respiratory: CTABL Abdomen: soft/ND/ND, positive bowel sounds Skin: no rash Musculoskeletal:  No edema Psychiatric: calm/cooperative Neurologic: chronic expressive aphasia          Labs on Admission:  Basic Metabolic Panel:  Recent Labs Lab 11/29/14 1130  NA 139  K 3.8  CL 101  CO2 26  GLUCOSE 119*  BUN 29*  CREATININE 2.06*  CALCIUM 8.5*   Liver Function Tests:  Recent Labs Lab 11/29/14 1130  AST 13*  ALT 12*  ALKPHOS 160*  BILITOT 0.4  PROT 5.1*  ALBUMIN 2.2*    Recent Labs Lab 11/29/14 1130  LIPASE 14*   No results for input(s): AMMONIA in the last 168 hours. CBC:  Recent Labs Lab 11/29/14 1130  WBC 11.0*  NEUTROABS 9.5*  HGB 8.2*  HCT 27.6*  MCV 83.6  PLT 606*   Cardiac Enzymes:  Recent Labs Lab 11/29/14 1130  TROPONINI <0.03    BNP (last 3 results) No results for input(s): BNP in the last 8760 hours.  ProBNP (last 3 results)  Recent Labs  04/15/14 2333  PROBNP 1258.0*    CBG: No results for input(s): GLUCAP in the last 168 hours.  Radiological Exams on Admission: Dg Abd Acute W/chest  11/29/2014   CLINICAL DATA:   Lethargy, lack of appetite nausea vomiting diarrhea  EXAM: DG ABDOMEN ACUTE W/ 1V CHEST  COMPARISON:  10/13/2014  FINDINGS: Stable mild cardiac enlargement. Uncoiling and calcification of the aorta. Vascular pattern normal. Lungs clear. No free air. Left shoulder arthritis.  No pneumoperitoneum identified on decubitus film. No abnormally dilated loops of bowel. No abnormal air-fluid levels. No abnormal opacities with nonspecific calcifications observed diffusely over the abdomen.  IMPRESSION: No acute abnormalities in the thorax or abdomen.   Electronically Signed   By: Skipper Cliche M.D.   On: 11/29/2014 12:05    EKG: Independently reviewed. Chronic Afib, nonspecific interventricular delay.  Assessment/Plan Present on Admission:  . UTI (lower urinary tract  infection) . FTT (failure to thrive) in adult  Anemia: denies pain. likely from acute on chronic GI loss from Known GI AVM, patient does not want endoscope, she and daughter agreed with holding coumadin, vitamin k and blood transfusion.  supertherapeutic INR, hold coumadin, vitk qd x3d,   UTI?: s/p levaquin x1 in the ED, patient denies urinary symptom. Urine culture pending. Continue levaquin for now.  Diarrhea? Denies recent abx use, will check c diff.  H/o chronic afib, currently rate controlled, bp borderline low, hold cardizem for now. Hold coumadin due to elevated inr.  H/o diastolic CHF, currently dry, hold lasix, on ivf, monitor volume status.  H/o copd, no wheezing on exam, continue home meds.  H/o cva (h/o subdural hematoma), baseline expressive aphasia.  Weakness: with progressive weight loss of 15pounds of the last year, with reported loss of appetite, not eating in the last few weeks, not able to ambulate for the last two weeks due to generalized weakness. Nutrition consult ordered, will need PT/OT once more stable.  DVT prophylaxis: scd's  Consultants: none  Code Status: full (confirmed with daughter  Family  Communication:  Patient and daughter  Disposition Plan: admit to tele  Time spent: >20mins  Anamika Kueker MD, PhD [facility] Pager [phone] If 7PM-7AM, please contact night-coverage at [email or web address], password [email or web address]

## 2014-11-30 DIAGNOSIS — N390 Urinary tract infection, site not specified: Secondary | ICD-10-CM

## 2014-11-30 DIAGNOSIS — R627 Adult failure to thrive: Secondary | ICD-10-CM

## 2014-11-30 LAB — TYPE AND SCREEN
ABO/RH(D): B POS
Antibody Screen: NEGATIVE
Unit division: 0

## 2014-11-30 LAB — LACTIC ACID, PLASMA
LACTIC ACID, VENOUS: 2 mmol/L (ref 0.5–2.0)
Lactic Acid, Venous: 1.8 mmol/L (ref 0.5–2.0)

## 2014-11-30 LAB — COMPREHENSIVE METABOLIC PANEL
ALT: 9 U/L — ABNORMAL LOW (ref 14–54)
ANION GAP: 8 (ref 5–15)
AST: 14 U/L — ABNORMAL LOW (ref 15–41)
Albumin: 1.8 g/dL — ABNORMAL LOW (ref 3.5–5.0)
Alkaline Phosphatase: 119 U/L (ref 38–126)
BILIRUBIN TOTAL: 0.7 mg/dL (ref 0.3–1.2)
BUN: 24 mg/dL — ABNORMAL HIGH (ref 6–20)
CO2: 23 mmol/L (ref 22–32)
CREATININE: 1.75 mg/dL — AB (ref 0.44–1.00)
Calcium: 7.7 mg/dL — ABNORMAL LOW (ref 8.9–10.3)
Chloride: 104 mmol/L (ref 101–111)
GFR calc Af Amer: 29 mL/min — ABNORMAL LOW (ref >60)
GFR, EST NON AFRICAN AMERICAN: 25 mL/min — AB (ref >60)
Glucose, Bld: 85 mg/dL (ref 65–99)
POTASSIUM: 3.5 mmol/L (ref 3.5–5.1)
Sodium: 135 mmol/L (ref 135–145)
Total Protein: 4 g/dL — ABNORMAL LOW (ref 6.5–8.1)

## 2014-11-30 LAB — CBC
HCT: 26.8 % — ABNORMAL LOW (ref 36.0–46.0)
HEMOGLOBIN: 8.4 g/dL — AB (ref 12.0–15.0)
MCH: 25.7 pg — ABNORMAL LOW (ref 26.0–34.0)
MCHC: 31.3 g/dL (ref 30.0–36.0)
MCV: 82 fL (ref 78.0–100.0)
Platelets: 380 10*3/uL (ref 150–400)
RBC: 3.27 MIL/uL — ABNORMAL LOW (ref 3.87–5.11)
RDW: 15 % (ref 11.5–15.5)
WBC: 7.9 10*3/uL (ref 4.0–10.5)

## 2014-11-30 LAB — PROTIME-INR
INR: 3.21 — ABNORMAL HIGH (ref 0.00–1.49)
PROTHROMBIN TIME: 32.2 s — AB (ref 11.6–15.2)

## 2014-11-30 MED ORDER — LEVOFLOXACIN IN D5W 250 MG/50ML IV SOLN
250.0000 mg | INTRAVENOUS | Status: DC
  Filled 2014-11-30: qty 50

## 2014-11-30 MED ORDER — LEVOFLOXACIN IN D5W 500 MG/100ML IV SOLN: 500.0000 mg | INTRAVENOUS | Status: DC

## 2014-11-30 MED ORDER — SODIUM CHLORIDE 0.9 % IV BOLUS (SEPSIS)
250.0000 mL | Freq: Once | INTRAVENOUS | Status: AC
  Administered 2014-11-30: 250 mL via INTRAVENOUS

## 2014-11-30 NOTE — Progress Notes (Signed)
BP runs low, BP-83/44 this AM. Angelica Eva NP was made aware, will give NS 250 ml bolus. We will continue to monitor.

## 2014-11-30 NOTE — Progress Notes (Addendum)
Patient ID: Angelica [name], female   DOB: February 15, 1923, 81 y.o.   MRN: [id number]  [facility] PROGRESS NOTE  Abria Vannostrand IRC:[id number] DOB: [birth year] DOA: 12/16/2014 PCP: [name], MD   Brief narrative:    Pt is 81 yo female who was sent from ALF for evaluation of progressive failure to thrive and weakness. Due to baseline expressive aphasia pt is unable to provide history and most of the information obtained from ED records. Daughter was available in ED and explained pt has been progressively declining with weight loss > 15 lbs in the past year, has been mostly bed bound with minimal mobility.   In ED, pt noted to be hemodynamically stable, VS notable for HR 107, SBP 83, RR 16 - 26, blood work notable for WBC 11, Hg 8,2, Cr 2.06, INR 5.83. UA suggestive of UTI. Pt started on Levaquin. [facility] asked to admit for further evaluation.   Assessment/Plan:    Active Problems:   Sepsis secondary to UTI - sepsis criteria met on admission with HR up to 107 bpm, RR up to 26 bpm, source UTI - pt started on Levaquin, continue same regimen follow up on urine culture - provide IVF - WBC Is trending down and now WNL  - will check lactic acid but may already be WNL as pt has been treated with ABX and fluid     Weakness with FTT - secondary to acute illness sepsis from UTI, imposed on progressive deconditioning  - treat acute illness as noted above - provide PT while inpatient     Diarrhea - C. Diff pending     Severe PCM, in the context of progressive FTT and subsequent weight loss  - nutritionist consulted - advance diet as pt able to tolerate     Anemia of chronic disease, IDA - no signs if active bleeding - repeat CBC in AM    Acute on chronic renal failure stage III - acute failure from pre renal etiology and dehydration imposed on CKD - Cr is trending down as IVF have been provided  - continue IVF and repeat BMP in AM    Acute functional quadriplegia - PT/OT requested      Atrial fibrillation, CHADS2 = 5 - coumadin per pharmacy, currently on hold due to supra therapeutic INR - appreciate pharmacy assistance with dosing and monitoring     Chronic diastolic CHF - last 2 D ECHO 04/2014 with normal EF - weight today if 55.6 kg, pt euvolemic - continue to monitor daily weighs, strict I/O  DVT prophylaxis - pt on Coumadin for a-fib  Code Status: Full.  Family Communication:  plan of care discussed with the patient, no family at bedside  Disposition Plan: Pt not ready for d/c, still requiring IV ABX Levaquin until urine culture report is back   IV access:  Peripheral IV  Procedures and diagnostic studies:    Dg Abd Acute W/chest 12/16/14   No acute abnormalities in the thorax or abdomen.     Medical Consultants:  None  Other Consultants:  PT/OT Nutritionist   IAnti-Infectives:   Levaquin 12/16/2022 -->  Faye Ramsay, MD  [facility] Pager [phone]  If 7PM-7AM, please contact night-coverage [email or web address] Password [email or web address] 11/30/2014, 10:22 AM   LOS: 1 day   HPI/Subjective: No events overnight. Reports feeling hungry.   Objective: Filed Vitals:   11/30/14 0500 11/30/14 0623 11/30/14 0717 11/30/14 0821  BP:  83/44 90/45   Pulse:  89    Temp:  97.5 F (36.4 C)  TempSrc:  Oral    Resp:  18    Height:      Weight: 55.656 kg (122 lb 11.2 oz)     SpO2:  99%  96%    Intake/Output Summary (Last 24 hours) at 11/30/14 1022 Last data filed at 11/30/14 3344  Gross per 24 hour  Intake 2080.58 ml  Output      0 ml  Net 2080.58 ml    Exam:   General:  Pt is alert, not in acute distress  Cardiovascular: Irregular rate and rhythm, no rubs, no gallops  Respiratory: Clear to auscultation bilaterally, no wheezing, diminished breath sounds at bases   Abdomen: Soft, non tender, non distended, bowel sounds present, no guarding  Extremities: pulses DP and PT palpable bilaterally  Data Reviewed: Basic Metabolic Panel:  Recent Labs Lab  11/29/14 1130 11/30/14 0525  NA 139 135  K 3.8 3.5  CL 101 104  CO2 26 23  GLUCOSE 119* 85  BUN 29* 24*  CREATININE 2.06* 1.75*  CALCIUM 8.5* 7.7*   Liver Function Tests:  Recent Labs Lab 11/29/14 1130 11/30/14 0525  AST 13* 14*  ALT 12* 9*  ALKPHOS 160* 119  BILITOT 0.4 0.7  PROT 5.1* 4.0*  ALBUMIN 2.2* 1.8*    Recent Labs Lab 11/29/14 1130  LIPASE 14*   CBC:  Recent Labs Lab 11/29/14 1130 11/30/14 0525  WBC 11.0* 7.9  NEUTROABS 9.5*  --   HGB 8.2* 8.4*  HCT 27.6* 26.8*  MCV 83.6 82.0  PLT 606* 380   Cardiac Enzymes:  Recent Labs Lab 11/29/14 1130  TROPONINI <0.03   Scheduled Meds: . sodium chloride   Intravenous STAT  . budesonide  0.5 mg Nebulization BID  . feeding supplement (ENSURE ENLIVE)  237 mL Oral BID BM  . ferrous sulfate  325 mg Oral BID WC  . loratadine  10 mg Oral Daily  . pantoprazole  40 mg Oral Daily  . phytonadione  5 mg Oral Daily  . potassium chloride  20 mEq Oral Daily  . predniSONE  10 mg Oral QODAY   Continuous Infusions: . sodium chloride 125 mL/hr at 11/29/14 2328

## 2014-11-30 NOTE — Evaluation (Signed)
Physical Therapy Evaluation Patient Details Name: Angelica [name] MRN: [id number] DOB: 07/08/1922 Today's Date: 11/30/2014   History of Present Illness  81 yo female who was sent from ALF for evaluation of progressive failure to thrive and weakness  Clinical Impression  Pt admitted with above diagnosis. Pt currently with functional limitations due to the deficits listed below (see PT Problem List). +2 assist for OOB to recliner. Pt will likely need SNF for rehab.  Pt will benefit from skilled PT to increase their independence and safety with mobility to allow discharge to the venue listed below.       Follow Up Recommendations SNF    Equipment Recommendations  Wheelchair (measurements PT)    Recommendations for Other Services       Precautions / Restrictions Precautions Precautions: Fall Restrictions Weight Bearing Restrictions: No      Mobility  Bed Mobility Overal bed mobility: Needs Assistance;+2 for physical assistance Bed Mobility: Rolling;Sidelying to Sit Rolling: Mod assist Sidelying to sit: Max assist;+2 for physical assistance       General bed mobility comments: verbal and manual cues for technique, Max assist to raise trunk and to bring BLEs over EOB  Transfers Overall transfer level: Needs assistance Equipment used: Rolling walker (2 wheeled) Transfers: Sit to/from [facility] Sit to Stand: Mod assist;+2 safety/equipment Stand pivot transfers: +2 physical assistance;+2 safety/equipment;Mod assist       General transfer comment: assist to rise, verbal and manual cues for hand placement on RW, pt able to take a few shuffling side steps with RW to recliner  Ambulation/Gait             General Gait Details: pt refused ambulation  Stairs            Wheelchair Mobility    Modified Rankin (Stroke Patients Only)       Balance Overall balance assessment: Needs assistance Sitting-balance support: Single extremity  supported;Feet supported Sitting balance-Leahy Scale: Poor Sitting balance - Comments: could maintain neutral sitting position for short periods (20 seconds), then began to lean R Postural control: Right lateral lean Standing balance support: Bilateral upper extremity supported Standing balance-Leahy Scale: Poor Standing balance comment: required BUE support                             Pertinent Vitals/Pain Pain Assessment: No/denies pain    Home Living Family/patient expects to be discharged to:: Skilled nursing facility                      Prior Function           Comments: per chart pt is from ALF where she walked with a RW but recently has been mostly bed bound, pt stated she didn't use a walker     Hand Dominance   Dominant Hand: Left    Extremity/Trunk Assessment   Upper Extremity Assessment: Generalized weakness           Lower Extremity Assessment: Generalized weakness (knee ext AROM -15* B, knee ext strength -4/5 B)      Cervical / Trunk Assessment: Kyphotic  Communication   Communication: Expressive difficulties (pt able to answer some questions but stated, "I don't know" to others, noted expressive aphasia in history)  Cognition Arousal/Alertness: Awake/alert Behavior During Therapy: WFL for tasks assessed/performed Overall Cognitive Status: No family/caregiver present to determine baseline cognitive functioning (pt not able to provide history, she can verbalize but stated  she doesn't remember to some questions and gives answers that contratict info provided by daughter in chart)                      General Comments      Exercises        Assessment/Plan    PT Assessment Patient needs continued PT services  PT Diagnosis Generalized weakness;Difficulty walking   PT Problem List Decreased strength;Decreased activity tolerance;Decreased balance;Decreased knowledge of use of DME;Decreased cognition;Decreased mobility   PT Treatment Interventions DME instruction;Gait training;Functional mobility training;Therapeutic activities;Patient/family education;Balance training;Therapeutic exercise   PT Goals (Current goals can be found in the Care Plan section) Acute Rehab PT Goals Patient Stated Goal: none stated PT Goal Formulation: With patient Time For Goal Achievement: 12/14/14 Potential to Achieve Goals: Fair    Frequency Min 3X/week   Barriers to discharge        Co-evaluation               End of Session Equipment Utilized During Treatment: Gait belt Activity Tolerance: Patient limited by fatigue Patient left: in chair;with call bell/phone within reach;with chair alarm set Nurse Communication: Mobility status         Time: 1150-1210 PT Time Calculation (min) (ACUTE ONLY): 20 min   Charges:   PT Evaluation $Initial PT Evaluation Tier I: 1 Procedure     PT G CodesPhilomena Doheny 11/30/2014, 12:29 PM [phone]

## 2014-11-30 NOTE — Evaluation (Signed)
Clinical/Bedside Swallow Evaluation Patient Details  Name: Angelica [name] MRN: [id number] Date of Birth: 09-24-1922  Today's Date: 11/30/2014 Time: SLP Start Time (ACUTE ONLY): 2979 SLP Stop Time (ACUTE ONLY): 1545 SLP Time Calculation (min) (ACUTE ONLY): 15 min  Past Medical History:  Past Medical History  Diagnosis Date  . Atrial fibrillation   . Hypertension   . Arthritis   . GERD (gastroesophageal reflux disease)   . COPD (chronic obstructive pulmonary disease)   . PNA (pneumonia)   . Anemia due to GI blood loss 07/11/2011  . 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 Stroke 2009    aphasia and memory impairment as residual  . Hypoxia   . Atrial fibrillation, permanent, rate controlled on coumadin 05/10/2011  . Hypogammaglobulinemia 06/01/2012    Immunoglobulins 05/01/2012-normal IgG M., normal IgA,  low IgG 344([phone]).   . 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 CVA history 05/10/2011  . CHF (congestive heart failure) 89/21/1941    diastolic  . History of nuclear stress test 2009    normal dobutamine myoview   Past Surgical History:  Past Surgical History  Procedure Laterality Date  . Hip fracture surgery    . Cholecystectomy    . Knee surgery    . Cataract extraction    . Transthoracic echocardiogram  06/2011    EF 55-60%; mild LVH; LA & RA mod dilated; mod TR   HPI:  Angelica [name] is a 81 y.o. female was sent to ER from ALF due to progressive weakness.  Per daughter, patient has been having progressive decline, with weight loss of 15 pounds over the last years, decreased appetite, Day of admit, pt developed diarrhea, she did not want to eat, patient was brought to the ED. CT abdomen/chest shows lungs are clear. Pt has a history of aphasia and dysphagia following CVA. MBS on 10/16 showed silent penetration and aspiration of thin and nectar thick liquids. Pt was recommended to  consume regular diet with thin liquids as there was no benefit to thickening. Throat clear helpful, but difficult to cue due to aphasia.    Assessment / Plan / Recommendation Clinical Impression  Pt presents with function consistent with result from MBS in 2015. Pt with delayed swallow with no coughing or choking, though likely pt is silently penetrating/aspirating small amount of bolus, as she did with all liquids on that exam. Attempted to elicit a cued throat clear without success. Given that pt has been consuming regular solids and thin liquids since being dx with dysphagia without any acute finding on current CXR, recommend pt continue current diet with basic precautions. Education completed with pts daughter during last assessment and she was aware of aspiration risk and agreed to diet; no response to calls today. Pt is at baseline, no further interventions needed at this time. Will sign off.     Aspiration Risk  Moderate    Diet Recommendation Thin (thin/regular)   Medication Administration: Whole meds with puree Compensations: Slow rate;Small sips/bites;Clear throat intermittently    Other  Recommendations Oral Care Recommendations: Oral care BID   Follow Up Recommendations       Frequency and Duration        Pertinent Vitals/Pain NA    SLP Swallow Goals     Swallow Study Prior Functional Status       General Other Pertinent Information: Angelica [name] is a 81 y.o. female was sent to ER from ALF due to  progressive weakness.  Per daughter, patient has been having progressive decline, with weight loss of 15 pounds over the last years, decreased appetite, Day of admit, pt developed diarrhea, she did not want to eat, patient was brought to the ED. CT abdomen/chest shows lungs are clear. Pt has a history of aphasia and dysphagia following CVA. MBS on 10/16 showed silent penetration and aspiration of thin and nectar thick liquids. Pt was recommended to consume regular diet with  thin liquids as there was no benefit to thickening. Throat clear helpful, but difficult to cue due to aphasia.  Type of Study: Bedside swallow evaluation Previous Swallow Assessment: see HPI Diet Prior to this Study: Regular;Thin liquids Temperature Spikes Noted: No Respiratory Status: Room air History of Recent Intubation: No Behavior/Cognition: Alert Self-Feeding Abilities: Needs assist Patient Positioning: Upright in bed Baseline Vocal Quality: Normal Volitional Cough: Cognitively unable to elicit Volitional Swallow: Unable to elicit    Oral/Motor/Sensory Function Overall Oral Motor/Sensory Function: Appears within functional limits for tasks assessed   Ice Chips     Thin Liquid Thin Liquid: Impaired Presentation: Cup Pharyngeal  Phase Impairments: Suspected delayed Swallow    Nectar Thick Nectar Thick Liquid: Not tested   Honey Thick Honey Thick Liquid: Not tested   Puree Puree: Within functional limits (ice cream) Presentation: Spoon   Solid   GO    Solid: Not tested      Herbie Baltimore, MA CCC-SLP [phone]  Angelica [name], Angelica [name] 11/30/2014,3:54 PM

## 2014-12-01 LAB — BASIC METABOLIC PANEL
Anion gap: 5 (ref 5–15)
BUN: 20 mg/dL (ref 6–20)
CALCIUM: 8.2 mg/dL — AB (ref 8.9–10.3)
CO2: 24 mmol/L (ref 22–32)
Chloride: 110 mmol/L (ref 101–111)
Creatinine, Ser: 1.56 mg/dL — ABNORMAL HIGH (ref 0.44–1.00)
GFR calc non Af Amer: 28 mL/min — ABNORMAL LOW (ref >60)
GFR, EST AFRICAN AMERICAN: 33 mL/min — AB (ref >60)
Glucose, Bld: 87 mg/dL (ref 65–99)
Potassium: 4 mmol/L (ref 3.5–5.1)
Sodium: 139 mmol/L (ref 135–145)

## 2014-12-01 LAB — CLOSTRIDIUM DIFFICILE BY PCR: Toxigenic C. Difficile by PCR: NEGATIVE

## 2014-12-01 LAB — CBC
HCT: 30 % — ABNORMAL LOW (ref 36.0–46.0)
HEMOGLOBIN: 9.1 g/dL — AB (ref 12.0–15.0)
MCH: 25.4 pg — AB (ref 26.0–34.0)
MCHC: 30.3 g/dL (ref 30.0–36.0)
MCV: 83.8 fL (ref 78.0–100.0)
Platelets: 403 10*3/uL — ABNORMAL HIGH (ref 150–400)
RBC: 3.58 MIL/uL — AB (ref 3.87–5.11)
RDW: 15.4 % (ref 11.5–15.5)
WBC: 8.4 10*3/uL (ref 4.0–10.5)

## 2014-12-01 LAB — PROTIME-INR
INR: 1.2 (ref 0.00–1.49)
Prothrombin Time: 15.4 seconds — ABNORMAL HIGH (ref 11.6–15.2)

## 2014-12-01 LAB — URINE CULTURE: Colony Count: >=100000

## 2014-12-01 LAB — OCCULT BLOOD X 1 CARD TO LAB, STOOL: Fecal Occult Bld: POSITIVE — AB

## 2014-12-01 MED ORDER — LEVOFLOXACIN IN D5W 500 MG/100ML IV SOLN
500.0000 mg | INTRAVENOUS | Status: DC
  Administered 2014-12-01: 500 mg via INTRAVENOUS
  Filled 2014-12-01 (×2): qty 100

## 2014-12-01 NOTE — Care Management Note (Signed)
Case Management Note  Patient Details  Name: Aleea Hendry MRN: [id number] Date of Birth: April 01, 1923  Subjective/Objective:81 y/o f admitted w/UTI.From indep liv.PT-SNF.CSW already following.                    Action/Plan:d/c SNF   Expected Discharge Date:                  Expected Discharge Plan:  Home/Self Care (Indep liv-[facility])  In-House Referral:  Clinical Social Work  Discharge planning Services  CM Consult  Post Acute Care Choice:    Choice offered to:     DME Arranged:    DME Agency:     HH Arranged:    HH Agency:     Status of Service:  In process, will continue to follow  Medicare Important Message Given:    Date Medicare IM Given:    Medicare IM give by:    Date Additional Medicare IM Given:    Additional Medicare Important Message give by:     If discussed at [place] of Stay Meetings, dates discussed:    Additional Comments:  Dessa Phi, RN 12/01/2014, 1:13 PM

## 2014-12-01 NOTE — Clinical Social Work Note (Signed)
Clinical Social Work Assessment  Patient Details  Name: Angelica [name] MRN: [id number] Date of Birth: 01/31/1923  Date of referral:  12/01/14               Reason for consult:  Facility Placement                Permission sought to share information with:  Facility Art therapist granted to share information::  Yes, Verbal Permission Granted  Name::        Agency::     Relationship::     Contact Information:     Housing/Transportation Living arrangements for the past 2 months:  [place] of Information:  Adult Children Patient Interpreter Needed:  None Criminal Activity/Legal Involvement Pertinent to Current Situation/Hospitalization:  No - Comment as needed Significant Relationships:  Adult Children, Spouse Lives with:  Spouse, Facility Resident Do you feel safe going back to the place where you live?  No Need for family participation in patient care:  Yes (Comment)  Care giving concerns:  CSW received consult that patient was admitted from [facility].    Social Worker assessment / plan:  CSW spoke with patient's daughter, [name] at bedside to discuss discharge planning - PT recommended SNF at discharge.   Employment status:  Retired Forensic scientist:  [employer] PT Recommendations:  [place] / Referral to community resources:  [place]  Patient/Family's Response to care:  Patient's daughter informed CSW that patient will not be happy about having to go to SNF, but understands the need for her to go to SNF. Patient has become more weak within the past weeks.   Patient/Family's Understanding of and Emotional Response to Diagnosis, Current Treatment, and Prognosis:  Patient's daughter seems to understand diagnosis and is coping appropriately. Daughter explained to CSW that when patient had to go to SNF in the past, her rehab stay was not covered by Medicare.  CSW explained that patient has been meeting intensity to stay in the hospital but CSW will check with [facility] (SNF that daughter is requesting) to make sure SNF would be covered.   Emotional Assessment Appearance:  Appears stated age Attitude/Demeanor/Rapport:    Affect (typically observed):    Orientation:  Oriented to Self Alcohol / Substance use:    Psych involvement (Current and /or in the community):     Discharge Needs  Concerns to be addressed:    Readmission within the last 30 days:    Current discharge risk:    Barriers to Discharge:      Standley Brooking, LCSW 12/01/2014, 2:58 PM

## 2014-12-01 NOTE — Progress Notes (Signed)
Initial Nutrition Assessment  DOCUMENTATION CODES:  Not applicable  INTERVENTION: - Continue Ensure Enlive BID and current diet order - RD to continue to monitor for needs  NUTRITION DIAGNOSIS:  Inadequate oral intake related to acute illness as evidenced by meal completion < 25%.  GOAL:  Patient will meet greater than or equal to 90% of their needs  MONITOR:  PO intake, Supplement acceptance, Weight trends, Labs  REASON FOR ASSESSMENT:  Malnutrition Screening Tool, Consult Assessment of nutrition requirement/status  ASSESSMENT: Per H&P, per daughter, patient has been having progressive decline, with weight loss of 15 pounds over the last year, decreased appetite, patient used to be able to walk with a walker, but last two weeks has been mostly in bed due to generalized weakness, she developed diarrhea this morning, she did not want to eat, patient was brought to the ED. Pt has an extensive medical hx which includes GERD, COPD, HTN, CVA, CHF, anemia, and dementia.  Pt seen for consult for assessment and MST. BMI indicates normal weight status. Pt ate 20% of breakfast this AM and yesterday's meal completions were 0% for breakfast, 10% for lunch, and 5% for dinner. No family present at time of visit. Pt able to respond with short answers. She indicates that she sometimes has difficulty swallowing. RN came into room as RD was leaving to assist pt with lunch tray which had been untouched. Current diet order per SLP recommendation.   Per weight hx review, pt has lost 8 lbs (6% body weight) in 3.5 months which is not significant for time frame. No muscle or fat wasting present during physical exam. Ensure Enlive ordered BID; will monitor for intakes of supplement. Medications reviewed. Labs reviewed; creatinine elevated, Ca low, GFR: 28.   Height:  Ht Readings from Last 1 Encounters:  12/01/14 5\' 4"  (1.626 m)    Weight:  Wt Readings from Last 1 Encounters:  12/01/14 122 lb 5.7  oz (55.5 kg)    Ideal Body Weight:  54.5 kg (kg)  Wt Readings from Last 10 Encounters:  12/01/14 122 lb 5.7 oz (55.5 kg)  11/12/14 123 lb 12.8 oz (56.155 kg)  08/19/14 130 lb (58.968 kg)  07/22/14 131 lb 9.6 oz (59.693 kg)  06/09/14 134 lb (60.782 kg)  04/15/14 140 lb 9.6 oz (63.776 kg)  02/18/14 140 lb 3.2 oz (63.594 kg)  02/03/14 142 lb 14.4 oz (64.819 kg)  01/08/14 145 lb 1 oz (65.8 kg)  08/06/13 145 lb 8 oz (65.998 kg)    BMI:  Body mass index is 20.99 kg/(m^2).  Estimated Nutritional Needs:  Kcal:  1200-1400  Protein:  55-70 grams  Fluid:  2.2-2.5 L/day  Skin:  Wound (see comment) (L arm abrasion)  Diet Order:  Diet regular Room service appropriate?: Yes; Fluid consistency:: Thin  EDUCATION NEEDS:  No education needs identified at this time   Intake/Output Summary (Last 24 hours) at 12/01/14 1155 Last data filed at 12/01/14 0856  Gross per 24 hour  Intake 2086.67 ml  Output      0 ml  Net 2086.67 ml    Last BM:  5/29    Jarome Matin, RD, LDN Inpatient Clinical Dietitian Pager # [phone] After hours/weekend pager # [phone]

## 2014-12-01 NOTE — Evaluation (Signed)
Occupational Therapy Evaluation Patient Details Name: Angelica [name] MRN: [id number] DOB: 01/10/1923 Today's Date: 12/01/2014    History of Present Illness 81 yo female who was sent from ALF for evaluation of progressive failure to thrive and weakness   Clinical Impression   Pt admitted with FTT. Pt currently with functional limitations due to the deficits listed below (see OT Problem List).  Pt will benefit from skilled OT to increase their safety and independence with ADL and functional mobility for ADL to facilitate discharge to venue listed below.      Follow Up Recommendations  SNF    Equipment Recommendations  None recommended by OT    Recommendations for Other Services       Precautions / Restrictions Precautions Precautions: Fall Restrictions Weight Bearing Restrictions: No      Mobility Bed Mobility Overal bed mobility: Needs Assistance Bed Mobility: Supine to Sit     Supine to sit: Mod assist        Transfers Overall transfer level: Needs assistance Equipment used: 1 person hand held assist Transfers: Sit to/from Stand;Stand Pivot Transfers Sit to Stand: Mod assist Stand pivot transfers: Mod assist       General transfer comment: Vc for safety     Balance Overall balance assessment: Needs assistance Sitting-balance support: Single extremity supported Sitting balance-Leahy Scale: Fair                                      ADL Overall ADL's : Needs assistance/impaired Eating/Feeding: Set up;Sitting   Grooming: Wash/dry face;Sitting;Set up   Upper Body Bathing: Minimal assitance;Sitting   Lower Body Bathing: Sit to/from stand;Maximal assistance   Upper Body Dressing : Minimal assistance;Sitting   Lower Body Dressing: Sit to/from stand;Maximal assistance   Toilet Transfer: Moderate assistance;BSC;Cueing for sequencing;Cueing for safety;Stand-pivot   Toileting- Clothing Manipulation and Hygiene: Maximal assistance;Sit  to/from stand                         Pertinent Vitals/Pain Pain Assessment: No/denies pain     Hand Dominance Left   Extremity/Trunk Assessment Upper Extremity Assessment Upper Extremity Assessment: Generalized weakness           Communication Communication Communication: Expressive difficulties (pt able to answer some questions but stated, "I don't know" to others, noted expressive aphasia in history)   Cognition Arousal/Alertness: Awake/alert Behavior During Therapy: WFL for tasks assessed/performed Overall Cognitive Status: No family/caregiver present to determine baseline cognitive functioning (pt not able to provide history, she can verbalize but stated she doesn't remember to some questions and gives answers that contratict info provided by daughter in chart)                                Home Living Family/patient expects to be discharged to:: Skilled nursing facility                                        Prior Functioning/Environment Level of Independence: Needs assistance        Comments: per chart pt is from ALF where she walked with a RW     OT Diagnosis: Generalized weakness   OT Problem List: Decreased strength;Decreased activity tolerance;Impaired balance (sitting and/or standing);Decreased cognition  OT Treatment/Interventions: Self-care/ADL training;Patient/family education    OT Goals(Current goals can be found in the care plan section) Acute Rehab OT Goals Patient Stated Goal: none stated Time For Goal Achievement: 12/15/14 Potential to Achieve Goals: Good  OT Frequency: Min 2X/week    End of Session Nurse Communication: Mobility status  Activity Tolerance: Patient tolerated treatment well Patient left: in chair;with call bell/phone within reach;with chair alarm set   Time: 5329-9242 OT Time Calculation (min): 14 min Charges:  OT General Charges $OT Visit: 1 Procedure OT Evaluation $Initial OT  Evaluation Tier I: 1 Procedure G-Codes:    Betsy Pries 12/11/14, 1:12 PM

## 2014-12-01 NOTE — Progress Notes (Signed)
Patient ID: Angelica [name], female   DOB: 06/15/1923, 81 y.o.   MRN: [id number]  [facility] PROGRESS NOTE  Angelica [name] VOH:[id number] DOB: 08/29/1922 DOA: 2014/12/20 PCP: [name], MD   Brief narrative:    Pt is 81 yo female who was sent from ALF for evaluation of progressive failure to thrive and weakness. Due to baseline expressive aphasia pt is unable to provide history and most of the information obtained from ED records. Daughter was available in ED and explained pt has been progressively declining with weight loss > 15 lbs in the past year, has been mostly bed bound with minimal mobility.   In ED, pt noted to be hemodynamically stable, VS notable for HR 107, SBP 83, RR 16 - 26, blood work notable for WBC 11, Hg 8,2, Cr 2.06, INR 5.83. UA suggestive of UTI. Pt started on Levaquin. [facility] asked to admit for further evaluation.   Assessment/Plan:    Active Problems:   Sepsis secondary to UTI - sepsis criteria met on admission with HR up to 107 bpm, RR up to 26 bpm, source UTI - pt started on Levaquin, continue same regimen, follow up on urine culture - provide IVF - WBC Is trending down and now WNL     Weakness with FTT - secondary to acute illness sepsis from UTI, imposed on progressive deconditioning  - treat acute illness as noted above - PT eval requested     Diarrhea - C. Diff pending     Inadequate oral intake - nutritionist consulted - advance diet as pt able to tolerate, thin diet recommended per SLP eval     Anemia of chronic disease, IDA - no signs if active bleeding - repeat CBC in AM    Acute on chronic renal failure stage III - acute failure from pre renal etiology and dehydration imposed on CKD - Cr is trending down as IVF have been provided  - continue IVF and repeat BMP in AM    Acute functional quadriplegia - PT/OT requested  - SNF upon discharge     Atrial fibrillation, CHADS2 = 5 - coumadin per pharmacy, currently on hold due to  supra therapeutic INR - appreciate pharmacy assistance with dosing and monitoring  - PT/INR pending this AM     Chronic diastolic CHF - last 2 D ECHO 04/2014 with normal EF - weight today if 55.5 kg, pt euvolemic - continue to monitor daily weighs, strict I/O  DVT prophylaxis - pt on Coumadin for a-fib  Code Status: Full.  Family Communication:  plan of care discussed with the patient, no family at bedside  Disposition Plan: Pt not ready for d/c, still requiring IV ABX Levaquin until urine culture report is back   IV access:  Peripheral IV  Procedures and diagnostic studies:    Dg Abd Acute W/chest 12/20/2014   No acute abnormalities in the thorax or abdomen.     Medical Consultants:  None  Other Consultants:  PT/OT Nutritionist   IAnti-Infectives:   Levaquin 2022-12-20 -->  Faye Ramsay, MD  [facility] Pager [phone]  If 7PM-7AM, please contact night-coverage [email or web address] Password [email or web address] 12/01/2014, 1:31 PM   LOS: 2 days   HPI/Subjective: No events overnight. Reports feeling better this AM  Objective: Filed Vitals:   12/01/14 0147 12/01/14 0559 12/01/14 0810 12/01/14 1151  BP: 93/49 104/56    Pulse:  88    Temp:  98.2 F (36.8 C)    TempSrc:  Oral    Resp:  16  Height:    _0  (1.626 m)  Weight:  55.5 kg (122 lb 5.7 oz)  55.5 kg (122 lb 5.7 oz)  SpO2:  98% 98%     Intake/Output Summary (Last 24 hours) at 12/01/14 1331 Last data filed at 12/01/14 0856  Gross per 24 hour  Intake 2086.67 ml  Output      0 ml  Net 2086.67 ml    Exam:   General:  Pt is alert, not in acute distress  Cardiovascular: Irregular rate and rhythm, no rubs, no gallops  Respiratory: Clear to auscultation bilaterally, no wheezing, diminished breath sounds at bases   Abdomen: Soft, non tender, non distended, bowel sounds present, no guarding  Extremities: pulses DP and PT palpable bilaterally  Data Reviewed: Basic Metabolic Panel:  Recent Labs Lab 11/29/14 1130  11/30/14 0525 12/01/14 0457  NA 139 135 139  K 3.8 3.5 4.0  CL 101 104 110  CO2 _1 GLUCOSE 119* 85 87  BUN 29* 24* 20  CREATININE 2.06* 1.75* 1.56*  CALCIUM 8.5* 7.7* 8.2*   Liver Function Tests:  Recent Labs Lab 11/29/14 1130 11/30/14 0525  AST 13* 14*  ALT 12* 9*  ALKPHOS 160* 119  BILITOT 0.4 0.7  PROT 5.1* 4.0*  ALBUMIN 2.2* 1.8*    Recent Labs Lab 11/29/14 1130  LIPASE 14*   CBC:  Recent Labs Lab 11/29/14 1130 11/30/14 0525 12/01/14 0457  WBC 11.0* 7.9 8.4  NEUTROABS 9.5*  --   --   HGB 8.2* 8.4* 9.1*  HCT 27.6* 26.8* 30.0*  MCV 83.6 82.0 83.8  PLT 606* 380 403*   Cardiac Enzymes:  Recent Labs Lab 11/29/14 1130  TROPONINI <0.03   Scheduled Meds: . budesonide  0.5 mg Nebulization BID  . feeding supplement (ENSURE ENLIVE)  237 mL Oral BID BM  . ferrous sulfate  325 mg Oral BID WC  . levofloxacin (LEVAQUIN) IV  500 mg Intravenous Q48H  . loratadine  10 mg Oral Daily  . pantoprazole  40 mg Oral Daily  . potassium chloride  20 mEq Oral Daily  . predniSONE  10 mg Oral QODAY   Continuous Infusions: . sodium chloride 50 mL/hr at 11/30/14 1705

## 2014-12-01 NOTE — Clinical Social Work Placement (Signed)
CSW provided SNF bed offers to daughter, [name] at bedside. Daughter would like for PT to work with patient again before determining whether she needs SNF or could just return to [facility] with caregivers at discharge. PT to work with patient in the morning.     Raynaldo Opitz, [facility] Clinical Social Worker cell #: [phone]    CLINICAL SOCIAL WORK PLACEMENT  NOTE  Date:  12/01/2014  Patient Details  Name: Angelica [name] MRN: [id number] Date of Birth: May 04, 1923  Clinical Social Work is seeking post-discharge placement for this patient at the [place] level of care (*CSW will initial, date and re-position this form in  chart as items are completed):  Yes   Patient/family provided with [facility] Work Department's list of facilities offering this level of care within the geographic area requested by the patient (or if unable, by the patient's family).  Yes   Patient/family informed of their freedom to choose among providers that offer the needed level of care, that participate in Medicare, Medicaid or managed care program needed by the patient, have an available bed and are willing to accept the patient.  Yes   Patient/family informed of [facility]'s ownership interest in [facility] and [facility], as well as of the fact that they are under no obligation to receive care at these facilities.  PASRR submitted to EDS on       PASRR number received on       Existing PASRR number confirmed on 12/01/14     FL2 transmitted to all facilities in geographic area requested by pt/family on 12/01/14     FL2 transmitted to all facilities within larger geographic area on       Patient informed that his/her managed care company has contracts with or will negotiate with certain facilities, including the following:        Yes   Patient/family informed of bed offers received.  Patient chooses bed  at       Physician recommends and patient chooses bed at      Patient to be transferred to   on  .  Patient to be transferred to facility by       Patient family notified on   of transfer.  Name of family member notified:        PHYSICIAN       Additional Comment:    _______________________________________________ Standley Brooking, LCSW 12/01/2014, 3:03 PM

## 2014-12-02 ENCOUNTER — Telehealth: Payer: Self-pay | Admitting: Pharmacist

## 2014-12-02 LAB — BASIC METABOLIC PANEL
Anion gap: 5 (ref 5–15)
BUN: 17 mg/dL (ref 6–20)
CO2: 21 mmol/L — AB (ref 22–32)
Calcium: 8 mg/dL — ABNORMAL LOW (ref 8.9–10.3)
Chloride: 108 mmol/L (ref 101–111)
Creatinine, Ser: 1.46 mg/dL — ABNORMAL HIGH (ref 0.44–1.00)
GFR calc Af Amer: 36 mL/min — ABNORMAL LOW (ref >60)
GFR calc non Af Amer: 31 mL/min — ABNORMAL LOW (ref >60)
GLUCOSE: 81 mg/dL (ref 65–99)
Potassium: 4.3 mmol/L (ref 3.5–5.1)
Sodium: 134 mmol/L — ABNORMAL LOW (ref 135–145)

## 2014-12-02 LAB — CBC
HCT: 29.4 % — ABNORMAL LOW (ref 36.0–46.0)
Hemoglobin: 8.7 g/dL — ABNORMAL LOW (ref 12.0–15.0)
MCH: 24.9 pg — ABNORMAL LOW (ref 26.0–34.0)
MCHC: 29.6 g/dL — ABNORMAL LOW (ref 30.0–36.0)
MCV: 84.2 fL (ref 78.0–100.0)
PLATELETS: 373 10*3/uL (ref 150–400)
RBC: 3.49 MIL/uL — ABNORMAL LOW (ref 3.87–5.11)
RDW: 15.2 % (ref 11.5–15.5)
WBC: 8.4 10*3/uL (ref 4.0–10.5)

## 2014-12-02 LAB — PROTIME-INR
INR: 1.15 (ref 0.00–1.49)
Prothrombin Time: 14.9 seconds (ref 11.6–15.2)

## 2014-12-02 MED ORDER — FUROSEMIDE 40 MG PO TABS
40.0000 mg | ORAL_TABLET | Freq: Every day | ORAL | Status: DC
  Administered 2014-12-02 – 2014-12-03 (×2): 40 mg via ORAL
  Filled 2014-12-02 (×2): qty 1

## 2014-12-02 MED ORDER — SODIUM CHLORIDE 0.9 % IV SOLN
125.0000 mg | Freq: Once | INTRAVENOUS | Status: AC
  Administered 2014-12-02: 125 mg via INTRAVENOUS
  Filled 2014-12-02: qty 10

## 2014-12-02 MED ORDER — FUROSEMIDE 40 MG PO TABS: 40.0000 mg | ORAL_TABLET | Freq: Every day | ORAL | Status: AC

## 2014-12-02 MED ORDER — FUROSEMIDE 80 MG PO TABS: 80.0000 mg | ORAL_TABLET | Freq: Every day | ORAL | Status: DC | PRN

## 2014-12-02 MED ORDER — FUROSEMIDE 40 MG PO TABS: 40.0000 mg | ORAL_TABLET | Freq: Every day | ORAL | Status: DC | PRN

## 2014-12-02 MED ORDER — SODIUM CHLORIDE 0.9 % IV SOLN: 50.0000 mg | Freq: Once | INTRAVENOUS | Status: DC

## 2014-12-02 MED ORDER — POTASSIUM CHLORIDE ER 10 MEQ PO TBCR
10.0000 meq | EXTENDED_RELEASE_TABLET | Freq: Every day | ORAL | Status: AC
Start: 2014-12-02 — End: ?

## 2014-12-02 MED ORDER — FUROSEMIDE 20 MG PO TABS: 20.0000 mg | ORAL_TABLET | Freq: Every day | ORAL | Status: DC | PRN

## 2014-12-02 MED ORDER — LEVOFLOXACIN 500 MG PO TABS: 500.0000 mg | ORAL_TABLET | Freq: Every day | ORAL | Status: DC

## 2014-12-02 NOTE — Progress Notes (Signed)
Occupational Therapy Treatment Patient Details Name: Angelica [name] MRN: [id number] DOB: 04-15-1923 Today's Date: 12/02/2014    History of present illness 81 yo female who was sent from ALF for evaluation of progressive failure to thrive and weakness   OT comments  Pt still requiring mod assist with functional transfers and ADL. Pt will need SNF and not safe to return to independent living at this time. Pt stating she didn't sleep well last night and is sleepy but did participate in OT.   Follow Up Recommendations  SNF    Equipment Recommendations  None recommended by OT    Recommendations for Other Services      Precautions / Restrictions Precautions Precautions: Fall       Mobility Bed Mobility Overal bed mobility: Needs Assistance Bed Mobility: Sit to Supine   Sidelying to sit: Mod assist   Sit to supine: Min assist   General bed mobility comments: pt required mod assist for trunk to upright for sitting EOB. pt starting to lie back down before fully toward [facility] but min assist for LEs onto bed.  Transfers Overall transfer level: Needs assistance Equipment used: None   Sit to Stand: Min assist Stand pivot transfers: Mod assist       General transfer comment: verbal cues for hand placement.     Balance                                   ADL                           Toilet Transfer: Moderate assistance;Stand-pivot;BSC;Cueing for safety;Cueing for sequencing   Toileting- Clothing Manipulation and Hygiene: Moderate assistance;Sit to/from stand         General ADL Comments: Pt sleepy when OT arrived and later stated she didnt sleep well last night. She didnt verbalize much during session unless asked a question but stated she didnt hurt, just tired. Pt needing to use the [facility] so pivoted to [facility] with mod assist. Pt did perform own hygiene but required mod assist standng support. Pt pivoted back to EOB but then too fatigued to side scoot  toward [facility] and just started to lie down. Required assist to be pulled up in the bed. Pt washed face with washcloth. When handed the toilet paper to wipe after toileting, pt starting to wipe face dry with the toilet paper so offered pt a washcloth. Pt needing cues to initiate hygiene with washcloth as she had had a BM smudge in the bed. Bed pad changed. ? If due to sleepiness but pt very slow to initiate tasks and needed cues to sequence and follow through.      Vision                     Perception     Praxis      Cognition   Behavior During Therapy: Flat affect Overall Cognitive Status: No family/caregiver present to determine baseline cognitive functioning                       Extremity/Trunk Assessment               Exercises     Shoulder Instructions       General Comments      Pertinent Vitals/ Pain       Pain Assessment: No/denies  pain  Home Living                                          Prior Functioning/Environment              Frequency Min 2X/week     Progress Toward Goals  OT Goals(current goals can now be found in the care plan section)  Progress towards OT goals: Progressing toward goals     Plan Discharge plan remains appropriate    Co-evaluation                 End of Session Equipment Utilized During Treatment: Gait belt   Activity Tolerance Patient limited by fatigue   Patient Left in bed;with call bell/phone within reach;with bed alarm set   Nurse Communication          Time: 1130-1155 OT Time Calculation (min): 25 min  Charges: OT General Charges $OT Visit: 1 Procedure OT Treatments $Self Care/Home Management : 8-22 mins $Therapeutic Activity: 8-22 mins  Jules Schick  [phone] 12/02/2014, 12:16 PM

## 2014-12-02 NOTE — Telephone Encounter (Signed)
LVM returning message from [facility] phone Looks like patient was just dc from hospital today 12/02/14 She has elected to stop coumadin LVM to call us and confirm those plans

## 2014-12-02 NOTE — Care Management Note (Signed)
Case Management Note  Patient Details  Name: Angelica [name] MRN: [id number] Date of Birth: 12/27/1922  Subjective/Objective:                    Action/Plan: d/c snf when stable   Expected Discharge Date:                  Expected Discharge Plan:  [place] ([facility])  In-House Referral:  Clinical Social Work  Discharge planning Services  CM Consult  Post Acute Care Choice:    Choice offered to:     DME Arranged:    DME Agency:     HH Arranged:    [place] Agency:     Status of Service:  In process, will continue to follow  Medicare Important Message Given:  Yes Date Medicare IM Given:  12/02/14 Medicare IM give by:  Dessa Phi Date Additional Medicare IM Given:    Additional Medicare Important Message give by:     If discussed at [place] of Stay Meetings, dates discussed:    Additional Comments:  Dessa Phi, RN 12/02/2014, 12:37 PM

## 2014-12-02 NOTE — Progress Notes (Signed)
PT Cancellation Note  Patient Details Name: Angelica [name] MRN: [id number] DOB: September 03, 1922   Cancelled Treatment:    Reason Eval/Treat Not Completed: Patient declined, no reason specified (attempted to work with patient at about 1500, stated 'No"  to  working w/ PT. no family present to encourage patient.)   [name], [name] 12/02/2014, 5:32 PM Tresa Endo PT [phone]

## 2014-12-02 NOTE — Discharge Summary (Addendum)
Physician Discharge Summary  Angelica [name] NIO:[id number] DOB: 16-Feb-1923 DOA: 11/29/2014  PCP: [name], MD  Admit date: 11/29/2014 Discharge date: 12/03/2014  Recommendations for Outpatient Follow-up:  1. Pt will need to follow up with PCP in 2-3 weeks post discharge 2. Please obtain BMP to evaluate electrolytes and kidney function 3. Please also check CBC to evaluate Hg and Hct levels 4. Please note that I have discussed with pt's daughter continuation of Coumadin as this was a big concern to her and recurrent bleeding. Daughter understands the higher risk of stroke and pt does as well but opts to stop Coumadin upon discharge 5. In addition, daughter reports significant constipation with iron supplementation and asked if we could discontinue this and see how pt does. I have agreed to stop iron supplementation for now and have provided IV Iron infusion prior to discharge. 6. Pt also asked to stop taking Lasix due to frequent urination and occasional resultant falls trying to get to the restroom on time. I have changed dose of Lasix from 80 mg PO to 40 mg PO PRN QD based on symptoms of dyspnea, weight gain, LE swelling. Daughter and pt both appreciated making these changes as comfort if also their goal.  7. Please note that pt was treated with Levaquin for UTI (urine culture sensitivity report indicates it is appropriate ABX choice) and will need to complete two more dose post discharge. Stop date is June 3rd, 2016.  8. Weight on discharge is 59 kg, monitor weight closely and readjust the dose of Lasix as clinically indicated  9. Will forward this note to pt's PCP and Dr. Beryle Beams   Discharge Diagnoses:  Active Problems:   UTI (lower urinary tract infection)   FTT (failure to thrive) in adult  Discharge Condition: Stable  Diet recommendation: Heart healthy diet discussed in details    Brief narrative:    Pt is 81 yo female who was sent from ALF for evaluation of  progressive failure to thrive and weakness. Due to baseline expressive aphasia pt is unable to provide history and most of the information obtained from ED records. Daughter was available in ED and explained pt has been progressively declining with weight loss > 15 lbs in the past year, has been mostly bed bound with minimal mobility.   In ED, pt noted to be hemodynamically stable, VS notable for HR 107, SBP 83, RR 16 - 26, blood work notable for WBC 11, Hg 8,2, Cr 2.06, INR 5.83. UA suggestive of UTI. Pt started on Levaquin. TRH asked to admit for further evaluation.   Assessment/Plan:    Active Problems:  Sepsis secondary to UTI - sepsis criteria met on admission with HR up to 107 bpm, RR up to 26 bpm, source UTI - Completed 5 days of Levaquin.  No further antibiotics recommended. - WBC Is trending down and now WNL    Weakness with FTT - secondary to acute illness sepsis from UTI, imposed on progressive deconditioning  - treat acute illness as noted above - PT eval requested, pt to be discharged to SNF in AM   Diarrhea - C. Diff negative, diarrhea resolved    Inadequate oral intake - nutritionist consulted - pt tolerating recommended thin regular diet well    Anemia of chronic disease, IDA - no signs if active bleeding, drop in Hg since admission likely from IVF provided  - last anemia panel 10/2014 with Fe level 25 - will provide IV infusion today, pt will need repeat anemia  panel within next month - daughter wants to stop oral iron supplementation and wants to have pt getting IV infusion instead as she thinks that oral supplementation is not helping, says pt has been on oral supplements for long time ans iron is persistently low (review of records do confirm persistently low iron level so it is possible pt has difficulty with absorption of iron and may benefit more from IV infusions)    Acute on chronic renal failure stage III - acute failure from pre renal etiology and  dehydration imposed on CKD - Cr is trending down as IVF have been provided  - repeat BM in AM piror to discharge    Acute functional quadriplegia - PT/OT requested  - SNF upon discharge    Atrial fibrillation, CHADS2 = 5 - coumadin per pharmacy, currently on hold due to supra therapeutic INR - due recurrent bleeding and FOBT +, daughter asking to stop Coumadin - agree with pt having high risk of fall and will respect pt's wishes to stop  - continue Cardizem     Hypokalemia - from Lasix  - continue to supplement upon discharge    Chronic diastolic CHF - last 2 D ECHO 04/2014 with normal EF - weight trending since admission: 55.5 kg --> 59 kg - daughter asked to stop Lasix but weight is up a bit this AM, with mild LE edema, so will need to continue Lasix but will lower the dose from 80 mg PO QD to 40 mg PO QD - lower blood pressure limits the use of higher dose of Lasix  - continue to monitor daily weighs  Code Status: Full.  Family Communication: plan of care discussed with the patient, daughter over the phone Disposition Plan: D/C SNF 12/03/14  IV access:  Peripheral IV  Procedures and diagnostic studies:   Dg Abd Acute W/chest 11/29/2014 No acute abnormalities in the thorax or abdomen.   Medical Consultants:  None  Other Consultants:  PT/OT Nutritionist   IAnti-Infectives:   Levaquin 5/28 --> 12/05/2014      Discharge Exam: Filed Vitals:   12/03/14 0521  BP: 106/65  Pulse: 61  Temp: 98.3 F (36.8 C)  Resp: 18   Filed Vitals:   12/02/14 1947 12/02/14 2110 12/03/14 0521 12/03/14 0853  BP:  107/51 106/65   Pulse:  91 61   Temp:  98.4 F (36.9 C) 98.3 F (36.8 C)   TempSrc:  Oral Oral   Resp:  17 18   Height:      Weight:   59.376 kg (130 lb 14.4 oz)   SpO2: 96% 100% 93% 96%    General: Pt is alert, follows commands appropriately, not in acute distress Cardiovascular: Irregular rate and rhythm, no rubs, no gallops Respiratory:  Clear to auscultation bilaterally, no wheezing, bibasilar crackles  Abdominal: Soft, non tender, non distended, bowel sounds +, no guarding Extremities: + 1 LE edema, no cyanosis, pulses palpable bilaterally DP and PT Neuro: Grossly nonfocal  Discharge Instructions      Discharge Instructions    Diet - low sodium heart healthy    Complete by:  As directed      Increase activity slowly    Complete by:  As directed             Medication List    STOP taking these medications        ferrous sulfate 325 (65 FE) MG tablet     warfarin 4 MG tablet  Commonly known as:  COUMADIN      TAKE these medications        albuterol (2.5 MG/3ML) 0.083% nebulizer solution  Commonly known as:  PROVENTIL  Take 2.5 mg by nebulization every 6 (six) hours as needed for wheezing or shortness of breath.     ALLERGY 10 MG tablet  Generic drug:  loratadine  Take 10 mg by mouth daily.     budesonide 0.5 MG/2ML nebulizer solution  Commonly known as:  PULMICORT  Take 0.5 mg by nebulization 2 (two) times daily.     diltiazem 180 MG 24 hr capsule  Commonly known as:  DILACOR XR  Take 180 mg by mouth daily before breakfast.     fluticasone 50 MCG/ACT nasal spray  Commonly known as:  FLONASE  Place 2 sprays into both nostrils 2 (two) times daily as needed for allergies.     furosemide 40 MG tablet  Commonly known as:  LASIX  Take 1 tablet (40 mg total) by mouth daily.     guaiFENesin 600 MG 12 hr tablet  Commonly known as:  MUCINEX  Take 1,200 mg by mouth 2 (two) times daily as needed for cough or to loosen phlegm.     pantoprazole 40 MG tablet  Commonly known as:  PROTONIX  Take 40 mg by mouth daily.     potassium chloride 10 MEQ tablet  Commonly known as:  K-DUR  Take 1 tablet (10 mEq total) by mouth daily.     predniSONE 5 MG tablet  Commonly known as:  DELTASONE  Take 10 mg by mouth every other day.        Follow-up Information    Follow up with [name], MD.    Specialty:  Internal Medicine   Contact information:   [address], [place] [address] [phone]        The results of significant diagnostics from this hospitalization (including imaging, microbiology, ancillary and laboratory) are listed below for reference.     Microbiology: Recent Results (from the past 240 hour(s))  Urine culture     Status: None   Collection Time: 11/29/14  1:28 PM  Result Value Ref Range Status   Specimen Description URINE, CLEAN CATCH  Final   Special Requests NONE  Final   Colony Count   Final    >=100,000 COLONIES/ML Performed at [facility]    Culture   Final    KLEBSIELLA PNEUMONIAE Performed at [facility]    Report Status 12/01/2014 FINAL  Final   Organism ID, Bacteria KLEBSIELLA PNEUMONIAE  Final      Susceptibility   Klebsiella pneumoniae - MIC*    AMPICILLIN RESISTANT      CEFAZOLIN <=4 SENSITIVE Sensitive     CEFTRIAXONE <=1 SENSITIVE Sensitive     CIPROFLOXACIN <=0.25 SENSITIVE Sensitive     GENTAMICIN <=1 SENSITIVE Sensitive     LEVOFLOXACIN <=0.12 SENSITIVE Sensitive     NITROFURANTOIN 32 SENSITIVE Sensitive     TOBRAMYCIN <=1 SENSITIVE Sensitive     TRIMETH/SULFA <=20 SENSITIVE Sensitive     PIP/TAZO <=4 SENSITIVE Sensitive     * KLEBSIELLA PNEUMONIAE  Clostridium Difficile by PCR     Status: None   Collection Time: 12/01/14  6:05 AM  Result Value Ref Range Status   C difficile by pcr NEGATIVE NEGATIVE Final     Labs: Basic Metabolic Panel:  Recent Labs Lab 11/29/14 1130 11/30/14 0525 12/01/14 0457 12/02/14 0400 12/03/14 0430  NA 139 135 139  134* 139  K 3.8 3.5 4.0 4.3 3.9  CL 101 104 110 108 107  CO2 26 23 24  21* 24  GLUCOSE 119* 85 87 81 92  BUN 29* 24* 20 17 17   CREATININE 2.06* 1.75* 1.56* 1.46* 1.47*  CALCIUM 8.5* 7.7* 8.2* 8.0* 8.5*   Liver Function Tests:  Recent Labs Lab 11/29/14 1130 11/30/14 0525  AST 13* 14*  ALT 12* 9*  ALKPHOS 160* 119  BILITOT 0.4  0.7  PROT 5.1* 4.0*  ALBUMIN 2.2* 1.8*    Recent Labs Lab 11/29/14 1130  LIPASE 14*   No results for input(s): AMMONIA in the last 168 hours. CBC:  Recent Labs Lab 11/29/14 1130 11/30/14 0525 12/01/14 0457 12/02/14 0400 12/03/14 0430  WBC 11.0* 7.9 8.4 8.4 9.1  NEUTROABS 9.5*  --   --   --   --   HGB 8.2* 8.4* 9.1* 8.7* 9.2*  HCT 27.6* 26.8* 30.0* 29.4* 30.9*  MCV 83.6 82.0 83.8 84.2 85.4  PLT 606* 380 403* 373 417*   Cardiac Enzymes:  Recent Labs Lab 11/29/14 1130  TROPONINI <0.03   BNP: BNP (last 3 results) No results for input(s): BNP in the last 8760 hours.  ProBNP (last 3 results)  Recent Labs  04/15/14 2333  PROBNP 1258.0*    CBG: No results for input(s): GLUCAP in the last 168 hours.   SIGNED: Time coordinating discharge: Over 21 minutes  RAMA,CHRISTINA, MD  [facility] 12/03/2014, 9:51 AM Pager [phone]  If 7PM-7AM, please contact night-coverage [email or web address] Password [email or web address]

## 2014-12-02 NOTE — Clinical Social Work Placement (Signed)
CSW spoke with patient's daughter, Angelica [name] who has accepted bed at [facility] when stable for discharge. CSW will check back tomorrow re: discharge.      Raynaldo Opitz, [facility] Clinical Social Worker cell #: [phone]   CLINICAL SOCIAL WORK PLACEMENT  NOTE  Date:  12/02/2014  Patient Details  Name: Angelica [name] MRN: [id number] Date of Birth: 27-Aug-1922  Clinical Social Work is seeking post-discharge placement for this patient at the [place] level of care (*CSW will initial, date and re-position this form in  chart as items are completed):  Yes   Patient/family provided with [place] Work Department's list of facilities offering this level of care within the geographic area requested by the patient (or if unable, by the patient's family).  Yes   Patient/family informed of their freedom to choose among providers that offer the needed level of care, that participate in Medicare, Medicaid or managed care program needed by the patient, have an available bed and are willing to accept the patient.  Yes   Patient/family informed of [place]'s ownership interest in [facility] and [facility], as well as of the fact that they are under no obligation to receive care at these facilities.  PASRR submitted to EDS on       PASRR number received on       Existing PASRR number confirmed on 12/01/14     FL2 transmitted to all facilities in geographic area requested by pt/family on 12/01/14     FL2 transmitted to all facilities within larger geographic area on       Patient informed that his/her managed care company has contracts with or will negotiate with certain facilities, including the following:        Yes   Patient/family informed of bed offers received.  Patient chooses bed at [facility]     Physician recommends and patient chooses bed at      Patient to be transferred to [facility] on   .  Patient to be transferred to facility by       Patient family notified on   of transfer.  Name of family member notified:        PHYSICIAN       Additional Comment:    _______________________________________________ Standley Brooking, LCSW 12/02/2014, 1:56 PM

## 2014-12-02 NOTE — Discharge Instructions (Signed)

## 2014-12-03 ENCOUNTER — Encounter: Payer: Self-pay | Admitting: Pharmacist

## 2014-12-03 ENCOUNTER — Other Ambulatory Visit: Payer: Medicare Other

## 2014-12-03 LAB — BASIC METABOLIC PANEL
Anion gap: 8 (ref 5–15)
BUN: 17 mg/dL (ref 6–20)
CO2: 24 mmol/L (ref 22–32)
CREATININE: 1.47 mg/dL — AB (ref 0.44–1.00)
Calcium: 8.5 mg/dL — ABNORMAL LOW (ref 8.9–10.3)
Chloride: 107 mmol/L (ref 101–111)
GFR calc Af Amer: 35 mL/min — ABNORMAL LOW (ref >60)
GFR, EST NON AFRICAN AMERICAN: 30 mL/min — AB (ref >60)
GLUCOSE: 92 mg/dL (ref 65–99)
Potassium: 3.9 mmol/L (ref 3.5–5.1)
SODIUM: 139 mmol/L (ref 135–145)

## 2014-12-03 LAB — CBC
HCT: 30.9 % — ABNORMAL LOW (ref 36.0–46.0)
HEMOGLOBIN: 9.2 g/dL — AB (ref 12.0–15.0)
MCH: 25.4 pg — ABNORMAL LOW (ref 26.0–34.0)
MCHC: 29.8 g/dL — AB (ref 30.0–36.0)
MCV: 85.4 fL (ref 78.0–100.0)
Platelets: 417 10*3/uL — ABNORMAL HIGH (ref 150–400)
RBC: 3.62 MIL/uL — AB (ref 3.87–5.11)
RDW: 15.9 % — AB (ref 11.5–15.5)
WBC: 9.1 10*3/uL (ref 4.0–10.5)

## 2014-12-03 NOTE — Clinical Social Work Placement (Signed)
Patient is set to discharge to [facility] today. Patient & daughter, [name] aware. Discharge packet given to RN, Robert Bellow. [facility] called for transport to pickup at 12:45.     Raynaldo Opitz, [facility] Clinical Social Worker cell #: [phone]    CLINICAL SOCIAL WORK PLACEMENT  NOTE  Date:  12/03/2014  Patient Details  Name: Angelica [name] MRN: [id number] Date of Birth: 03-08-1923  Clinical Social Work is seeking post-discharge placement for this patient at the [place] level of care (*CSW will initial, date and re-position this form in  chart as items are completed):  Yes   Patient/family provided with [place] Work Department's list of facilities offering this level of care within the geographic area requested by the patient (or if unable, by the patient's family).  Yes   Patient/family informed of their freedom to choose among providers that offer the needed level of care, that participate in Medicare, Medicaid or managed care program needed by the patient, have an available bed and are willing to accept the patient.  Yes   Patient/family informed of [place]'s ownership interest in [facility] and [facility], as well as of the fact that they are under no obligation to receive care at these facilities.  PASRR submitted to EDS on       PASRR number received on       Existing PASRR number confirmed on 12/01/14     FL2 transmitted to all facilities in geographic area requested by pt/family on 12/01/14     FL2 transmitted to all facilities within larger geographic area on       Patient informed that his/her managed care company has contracts with or will negotiate with certain facilities, including the following:        Yes   Patient/family informed of bed offers received.  Patient chooses bed at [facility]     Physician recommends and patient chooses bed at      Patient to be transferred to [facility] on 12/03/14.  Patient to be transferred to facility by [facility]     Patient family notified on 12/03/14 of transfer.  Name of family member notified:  patient's daughter, [name] via phone     PHYSICIAN       Additional Comment:    _______________________________________________ Standley Brooking, LCSW 12/03/2014, 10:36 AM

## 2014-12-03 NOTE — Progress Notes (Signed)
Report called to Adonis Huguenin, Therapist, sports at [place] facility.

## 2014-12-03 NOTE — Progress Notes (Signed)
Per previous discussion, Dr. Marin Olp has agreed to take over her care. For now, I agree to stop coumadin. Cameo, can you find out if Dr. Marin Olp office is going to see her? He agreed per last discussion w me

## 2014-12-03 NOTE — Progress Notes (Signed)
Patient stable for discharge. No change to discharge summary previously dictated except Levaquin discontinued at discharge.  RAMA,CHRISTINA 12/03/2014

## 2014-12-03 NOTE — Care Management Note (Signed)
Case Management Note  Patient Details  Name: Angelica [name] MRN: [id number] Date of Birth: July 22, 1922  Subjective/Objective:                    Action/Plan:d/c snf.   Expected Discharge Date:                  Expected Discharge Plan:  [place] ([facility])  In-House Referral:  Clinical Social Work  Discharge planning Services  CM Consult  Post Acute Care Choice:    Choice offered to:     DME Arranged:    DME Agency:     HH Arranged:    [place] Agency:     Status of Service:  Completed, signed off  Medicare Important Message Given:  Yes Date Medicare IM Given:  12/02/14 Medicare IM give by:  Dessa Phi Date Additional Medicare IM Given:    Additional Medicare Important Message give by:     If discussed at [place] of Stay Meetings, dates discussed:    Additional Comments:  Dessa Phi, RN 12/03/2014, 10:09 AM

## 2014-12-03 NOTE — Progress Notes (Signed)
I spoke with Gardiner Barefoot today.  She states Ms Schoenberger's coumadin will be on hold for now.  Ms Audelia Acton has discussed the increased risk of CVA with inpt MD as noted in discharge summary 12/02/14.  She will be going to a rehab facility after D/C from [facility].  I informed Ms Audelia Acton to let us know if Coumadin is resumed and will follow INR's.  Ms Suh has appt with Dr. Alvy Bimler on 01/13/15.  Will f/u at that time if we do not hear from Ms [facility] prior.

## 2014-12-04 ENCOUNTER — Non-Acute Institutional Stay (SKILLED_NURSING_FACILITY): Payer: Medicare Other | Admitting: Adult Health

## 2014-12-04 ENCOUNTER — Encounter: Payer: Self-pay | Admitting: Adult Health

## 2014-12-04 DIAGNOSIS — E43 Unspecified severe protein-calorie malnutrition: Secondary | ICD-10-CM | POA: Diagnosis not present

## 2014-12-04 DIAGNOSIS — I5032 Chronic diastolic (congestive) heart failure: Secondary | ICD-10-CM | POA: Diagnosis not present

## 2014-12-04 DIAGNOSIS — I482 Chronic atrial fibrillation, unspecified: Secondary | ICD-10-CM

## 2014-12-04 DIAGNOSIS — E876 Hypokalemia: Secondary | ICD-10-CM | POA: Diagnosis not present

## 2014-12-04 DIAGNOSIS — N183 Chronic kidney disease, stage 3 unspecified: Secondary | ICD-10-CM

## 2014-12-04 DIAGNOSIS — D638 Anemia in other chronic diseases classified elsewhere: Secondary | ICD-10-CM

## 2014-12-04 DIAGNOSIS — R5381 Other malaise: Secondary | ICD-10-CM | POA: Diagnosis not present

## 2014-12-04 DIAGNOSIS — J438 Other emphysema: Secondary | ICD-10-CM

## 2014-12-04 DIAGNOSIS — N390 Urinary tract infection, site not specified: Secondary | ICD-10-CM

## 2014-12-04 NOTE — Progress Notes (Signed)
Patient ID: Angelica [name], female   DOB: [birth year], 81 y.o.   MRN: [id number]   12/04/2014  Facility:  Nursing Home Location:  [place] Room Number: 979-G LEVEL OF CARE:  SNF (31)   Chief Complaint  Patient presents with  . Hospitalization Follow-up    Physical deconditioning, UTI, anemia, CKD, atrial fibrillation, hypokalemia, chronic diastolic CHF and protein calorie malnutrition    HISTORY OF PRESENT ILLNESS:  This is an 81 year old female who has been admitted to [facility] on 12/03/14 from [facility]. She has PMH of atrial fibrillation, hypertension, arthritis, GERD, COPD, anemia, stroke and CHF. She was brought to the ED for progressive decline with weight loss of >15 lbs in the past years and  decreased appetite. She was treated for UTI with Levaquin.  She has been admitted for a short-term rehabilitation.  PAST MEDICAL HISTORY:  Past Medical History  Diagnosis Date  . Atrial fibrillation   . Hypertension   . Arthritis   . GERD (gastroesophageal reflux disease)   . COPD (chronic obstructive pulmonary disease)   . PNA (pneumonia)   . Anemia due to GI blood loss 07/11/2011  . 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 Stroke 2009    aphasia and memory impairment as residual  . Hypoxia   . Atrial fibrillation, permanent, rate controlled on coumadin 05/10/2011  . Hypogammaglobulinemia 06/01/2012    Immunoglobulins 05/01/2012-normal IgG M., normal IgA,  low IgG [phone]).   . 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 CVA history 05/10/2011  . CHF (congestive heart failure) 92/05/9416    diastolic  . History of nuclear stress test 2009    normal dobutamine myoview    CURRENT MEDICATIONS: Reviewed per MAR/see medication list  Allergies  Allergen Reactions  . Amoxicillin Hives  . Penicillins Hives     REVIEW OF SYSTEMS:  Unable to obtain  due to expressive aphasia  PHYSICAL EXAMINATION  GENERAL: no acute distress, normal body habitus EYES: conjunctivae normal, sclerae normal, normal eye lids NECK: supple, trachea midline, no neck masses, no thyroid tenderness, no thyromegaly LYMPHATICS: no LAN in the neck, no supraclavicular LAN RESPIRATORY: breathing is even & unlabored, BS CTAB CARDIAC: irregularly irregular, no murmur,no extra heart sounds, no edema GI: abdomen soft, normal BS, no masses, no tenderness, no hepatomegaly, no splenomegaly EXTREMITIES: Able to move 4 extremities PSYCHIATRIC: the patient is alert & oriented to person, affect & behavior appropriate  LABS/RADIOLOGY: Labs reviewed: Basic Metabolic Panel:  Recent Labs  01/06/14 1832  04/16/14 0610  12/01/14 0457 12/02/14 0400 12/03/14 0430  NA  --   < > 143  < > 139 134* 139  K  --   < > 3.4*  < > 4.0 4.3 3.9  CL  --   < > 105  < > 110 108 107  CO2  --   < > 28  < > 24 21* 24  GLUCOSE  --   < > 87  < > 87 81 92  BUN  --   < > 10  < > 20 17 17   CREATININE  --   < > 0.84  < > 1.56* 1.46* 1.47*  CALCIUM  --   < > 8.7  < > 8.2* 8.0* 8.5*  MG 1.8  --  1.9  --   --   --   --   < > = values in this  interval not displayed. Liver Function Tests:  Recent Labs  06/09/14 1240 11/29/14 1130 11/30/14 0525  AST 14 13* 14*  ALT 10 12* 9*  ALKPHOS 97 160* 119  BILITOT <0.2* 0.4 0.7  PROT 5.1* 5.1* 4.0*  ALBUMIN 2.3* 2.2* 1.8*    Recent Labs  11/29/14 1130  LIPASE 14*   CBC:  Recent Labs  10/29/14 1102 11/12/14 1106 11/29/14 1130  12/01/14 0457 12/02/14 0400 12/03/14 0430  WBC 13.2* 10.8* 11.0*  < > 8.4 8.4 9.1  NEUTROABS 10.6* 8.8* 9.5*  --   --   --   --   HGB 9.9* 9.9* 8.2*  < > 9.1* 8.7* 9.2*  HCT 32.0* 32.5* 27.6*  < > 30.0* 29.4* 30.9*  MCV 88 87.1 83.6  < > 83.8 84.2 85.4  PLT 528* 632* 606*  < > 403* 373 417*  < > = values in this interval not displayed.   Lipid Panel:  Recent Labs  04/16/14 0610  HDL 52   Cardiac  Enzymes:  Recent Labs  04/15/14 2333 11/29/14 1130  TROPONINI <0.30 <0.03   CBG:  Recent Labs  10/13/14 1112  GLUCAP 106*     Dg Abd Acute W/chest  11/29/2014   CLINICAL DATA:  Lethargy, lack of appetite nausea vomiting diarrhea  EXAM: DG ABDOMEN ACUTE W/ 1V CHEST  COMPARISON:  10/13/2014  FINDINGS: Stable mild cardiac enlargement. Uncoiling and calcification of the aorta. Vascular pattern normal. Lungs clear. No free air. Left shoulder arthritis.  No pneumoperitoneum identified on decubitus film. No abnormally dilated loops of bowel. No abnormal air-fluid levels. No abnormal opacities with nonspecific calcifications observed diffusely over the abdomen.  IMPRESSION: No acute abnormalities in the thorax or abdomen.   Electronically Signed   By: Skipper Cliche M.D.   On: 11/29/2014 12:05    ASSESSMENT/PLAN:  Physical deconditioning - for rehabilitation UTI - recently finished antibiotic/Levaquine Anemia of chronic disease - hemoglobin 9.2; S/P iron infusion in the hospital; PO iron was discontinued since it was making patient constipated Chronic kidney disease stage III - creatinine 1.47; will monitor Atrial fibrillation - opted to stop Coumadin due to recurrent bleeding, FOBT (+); continue Diltiazem 24 hr 180 mg 1 capsule PO Q D Hypokalemia - K3.9; continue K Dur 10 meq PO Q D Chronic diastolic CHF - recently decreased Lasix to 40 mg PO Q D; daily weights Protein-calorie malnutrition, severe - albumin 1.8;  RD consultation COPD - continue Pulmicort 0.5 mg via nebulizer BID Prednisone 5 mg take 10 mg PO Q other day   Goals of care:  Short-term rehabilitation   Labs/test ordered:  CBC and BMP   Spent 50 minutes in patient care.    [facility], NP [employer] [phone]

## 2014-12-05 ENCOUNTER — Other Ambulatory Visit: Payer: Self-pay

## 2014-12-05 ENCOUNTER — Encounter: Payer: Self-pay | Admitting: Internal Medicine

## 2014-12-05 ENCOUNTER — Non-Acute Institutional Stay (SKILLED_NURSING_FACILITY): Payer: Medicare Other | Admitting: Internal Medicine

## 2014-12-05 DIAGNOSIS — E46 Unspecified protein-calorie malnutrition: Secondary | ICD-10-CM | POA: Insufficient documentation

## 2014-12-05 DIAGNOSIS — D500 Iron deficiency anemia secondary to blood loss (chronic): Secondary | ICD-10-CM | POA: Diagnosis not present

## 2014-12-05 DIAGNOSIS — J438 Other emphysema: Secondary | ICD-10-CM

## 2014-12-05 DIAGNOSIS — N390 Urinary tract infection, site not specified: Secondary | ICD-10-CM | POA: Diagnosis not present

## 2014-12-05 DIAGNOSIS — K219 Gastro-esophageal reflux disease without esophagitis: Secondary | ICD-10-CM

## 2014-12-05 DIAGNOSIS — I482 Chronic atrial fibrillation, unspecified: Secondary | ICD-10-CM

## 2014-12-05 DIAGNOSIS — S51019A Laceration without foreign body of unspecified elbow, initial encounter: Secondary | ICD-10-CM | POA: Insufficient documentation

## 2014-12-05 DIAGNOSIS — A419 Sepsis, unspecified organism: Secondary | ICD-10-CM | POA: Diagnosis not present

## 2014-12-05 DIAGNOSIS — I5032 Chronic diastolic (congestive) heart failure: Secondary | ICD-10-CM | POA: Diagnosis not present

## 2014-12-05 DIAGNOSIS — R5381 Other malaise: Secondary | ICD-10-CM

## 2014-12-05 DIAGNOSIS — N189 Chronic kidney disease, unspecified: Secondary | ICD-10-CM | POA: Insufficient documentation

## 2014-12-05 DIAGNOSIS — S51012D Laceration without foreign body of left elbow, subsequent encounter: Secondary | ICD-10-CM

## 2014-12-05 DIAGNOSIS — N183 Chronic kidney disease, stage 3 (moderate): Secondary | ICD-10-CM

## 2014-12-05 NOTE — Progress Notes (Signed)
Patient ID: Angelica [name], female   DOB: [birth year], 81 y.o.   MRN: [id number]    [place]  PCP: [name], MD  Code Status: Full Code  Allergies  Allergen Reactions  . Amoxicillin Hives  . Penicillins Hives    Chief Complaint  Patient presents with  . New Admit To SNF    New admission      HPI:  81 year old patient is here for short term rehabilitation post hospital admission from 11/29/14-12/03/14 with sepsis in the setting of UTI. She responded well to antibiotics. She also had acute renal failure which responded well to iv fluids. She has PMH of iron deficiency anemia, afib, ckd stage 3, chronic diastolic chf among others. Of note, her po iron supplement was discontinued in the hospital as per family request. Coumadin was discontinued as per family request and lasix dosing was decreased. She was residing in ALF prior to this. She was seen in her room today. She denies any concerns this visit. She is sitting on her wheelcahir and eating grapes. She feels weak and tired.    Review of Systems:  Constitutional: Negative for fever, chills, diaphoresis.  HENT: Negative for headache, congestion Eyes: Negative for eye pain, blurred vision, double vision and discharge. wears glasses Respiratory: Negative for cough, shortness of breath and wheezing.   Cardiovascular: Negative for chest pain, palpitations, leg swelling.  Gastrointestinal: Negative for heartburn, nausea, vomiting, abdominal pain. Appetite is fair. had a bowel movement this am Genitourinary: Negative for dysuria Musculoskeletal: Negative for back pain, falls in the facility Skin: Negative for itching, rash.  Neurological: Negative for dizziness, tingling Psychiatric/Behavioral: Negative for depression   Past Medical History  Diagnosis Date  . Atrial fibrillation   . Hypertension   . Arthritis   . GERD (gastroesophageal reflux disease)   . COPD (chronic obstructive pulmonary disease)   .  PNA (pneumonia)   . Anemia due to GI blood loss 07/11/2011  . 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 Stroke 2009    aphasia and memory impairment as residual  . Hypoxia   . Atrial fibrillation, permanent, rate controlled on coumadin 05/10/2011  . Hypogammaglobulinemia 06/01/2012    Immunoglobulins 05/01/2012-normal IgG M., normal IgA,  low IgG [phone]).   . 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 CVA history 05/10/2011  . CHF (congestive heart failure) 93/26/7124    diastolic  . History of nuclear stress test 2009    normal dobutamine myoview   Past Surgical History  Procedure Laterality Date  . Hip fracture surgery    . Cholecystectomy    . Knee surgery    . Cataract extraction    . Transthoracic echocardiogram  06/2011    EF 55-60%; mild LVH; LA & RA mod dilated; mod TR   Social History:   reports that she has never smoked. She has never used smokeless tobacco. She reports that she does not drink alcohol or use illicit drugs.  Family History  Problem Relation Age of Onset  . Heart disease Mother   . Rectal cancer Mother   . Leukemia Father   . Heart failure Mother   . Stroke Mother   . Cancer Maternal Grandmother   . Hypertension Son   . Hypertension Daughter     Medications: Patient's Medications  New Prescriptions   No medications on file  Previous Medications   ALBUTEROL (PROVENTIL) (2.5 MG/3ML) 0.083% NEBULIZER SOLUTION  Take 2.5 mg by nebulization every 6 (six) hours as needed for wheezing or shortness of breath.    BUDESONIDE (PULMICORT) 0.5 MG/2ML NEBULIZER SOLUTION    Take 0.5 mg by nebulization 2 (two) times daily.    DILTIAZEM (DILACOR XR) 180 MG 24 HR CAPSULE    Take 180 mg by mouth daily before breakfast.    FLUTICASONE (FLONASE) 50 MCG/ACT NASAL SPRAY    Place 2 sprays into both nostrils 2 (two) times daily as needed for allergies.    FUROSEMIDE (LASIX) 40 MG  TABLET    Take 1 tablet (40 mg total) by mouth daily.   GUAIFENESIN (MUCINEX) 600 MG 12 HR TABLET    Take 1,200 mg by mouth 2 (two) times daily as needed for cough or to loosen phlegm.   LORATADINE (ALLERGY) 10 MG TABLET    Take 10 mg by mouth daily. For Allergies   PANTOPRAZOLE (PROTONIX) 40 MG TABLET    Take 40 mg by mouth daily. For GERD   POTASSIUM CHLORIDE (K-DUR) 10 MEQ TABLET    Take 1 tablet (10 mEq total) by mouth daily.   PREDNISONE (DELTASONE) 5 MG TABLET    Take 10 mg by mouth every other day. For COPD  Modified Medications   No medications on file  Discontinued Medications   No medications on file     Physical Exam: Filed Vitals:   12/05/14 0841  BP: 105/61  Pulse: 73  Temp: 97.1 F (36.2 C)  TempSrc: Oral  Resp: 17  Height: 5\' 4"  (1.626 m)  Weight: 126 lb (57.153 kg)  SpO2: 97%   Wt Readings from Last 3 Encounters:  12/05/14 126 lb (57.153 kg)  12/04/14 122 lb (55.339 kg)  12/03/14 130 lb 14.4 oz (59.376 kg)   General- elderly female, frail, in no acute distress Head- normocephalic, atraumatic Throat- moist mucus membrane Neck- no cervical lymphadenopathy Cardiovascular- irregular heart rate, no murmurs, palpable dorsalis pedis and radial pulses, no leg edema Respiratory- bilateral clear to auscultation, no wheeze, no rhonchi, no crackles, no use of accessory muscles Abdomen- bowel sounds present, soft, non tender Musculoskeletal- able to move all 4 extremities, generalized weakness but lower extremity weakness more prominent Neurological- alert, no focal deficit Skin- warm and dry, left arm skin tear Psychiatry- normal mood and affect    Labs reviewed: Basic Metabolic Panel:  Recent Labs  01/06/14 1832  04/16/14 0610  12/01/14 0457 12/02/14 0400 12/03/14 0430  NA  --   < > 143  < > 139 134* 139  K  --   < > 3.4*  < > 4.0 4.3 3.9  CL  --   < > 105  < > 110 108 107  CO2  --   < > 28  < > 24 21* 24  GLUCOSE  --   < > 87  < > 87 81 92  BUN  --    < > 10  < > 20 17 17   CREATININE  --   < > 0.84  < > 1.56* 1.46* 1.47*  CALCIUM  --   < > 8.7  < > 8.2* 8.0* 8.5*  MG 1.8  --  1.9  --   --   --   --   < > = values in this interval not displayed. Liver Function Tests:  Recent Labs  06/09/14 1240 11/29/14 1130 11/30/14 0525  AST 14 13* 14*  ALT 10 12* 9*  ALKPHOS 97 160* 119  BILITOT <0.2*  0.4 0.7  PROT 5.1* 5.1* 4.0*  ALBUMIN 2.3* 2.2* 1.8*    Recent Labs  11/29/14 1130  LIPASE 14*   No results for input(s): AMMONIA in the last 8760 hours. CBC:  Recent Labs  10/29/14 1102 11/12/14 1106 11/29/14 1130  12/01/14 0457 12/02/14 0400 12/03/14 0430  WBC 13.2* 10.8* 11.0*  < > 8.4 8.4 9.1  NEUTROABS 10.6* 8.8* 9.5*  --   --   --   --   HGB 9.9* 9.9* 8.2*  < > 9.1* 8.7* 9.2*  HCT 32.0* 32.5* 27.6*  < > 30.0* 29.4* 30.9*  MCV 88 87.1 83.6  < > 83.8 84.2 85.4  PLT 528* 632* 606*  < > 403* 373 417*  < > = values in this interval not displayed. Cardiac Enzymes:  Recent Labs  04/15/14 2333 11/29/14 1130  TROPONINI <0.30 <0.03   BNP: Invalid input(s): POCBNP CBG:  Recent Labs  10/13/14 1112  GLUCAP 106*    Assessment/Plan  Physical deconditioning With recent sepsis. Will have her work with physical therapy and occupational therapy team to help with gait training and muscle strengthening exercises.fall precautions. Skin care. Encourage to be out of bed.   Sepsis due to UTI  Resolved. Currently asymptomatic. Completed her antibiotic course. Hydration encouraged. Monitor clinically  Iron deficiency anemia S/p iron infusion in hospital. Hb 9.2 on discharge. Monitor h&h. Currently off iron po supplement on family request  Chronic kidney disease stage III creatinine 1.47 on discharge. Had acute on chronic renal failure in hospital. Responded well to iv fluids. Monitor  Atrial fibrillation Rate controlled. Continue diltiazem 180 mg daily. Off coumadin per pt/ family request. Understands risk for stroke     Protein calorie malnutrition Monitor weight, started on procel 2 scoop bid for nutritional support.   Skin tear To left arm, continue to clean the area and apply xeroform dressing  Chronic diastolic chf Will need daily weight monitoring. On lasix 40 mg daily at discharge and tolerating well. Has lost 4 lbs. Continue kcl supplement. Monitor bmp  COPD Stable. continue Pulmicort and prednisone 10 mg daily  gerd Continue protonix 40 mg daily   Goals of care: short term rehabilitation   Labs/tests ordered: cbc, bmp  Family/ staff Communication: reviewed care plan with patient and nursing supervisor    Blanchie Serve, MD  [place] [phone] (Monday-Friday 8 am - 5 pm) [phone] (afterhours)

## 2014-12-08 ENCOUNTER — Telehealth: Payer: Self-pay | Admitting: Family

## 2014-12-08 NOTE — Telephone Encounter (Signed)
Lt mess regarding 7/12 appt.

## 2014-12-24 ENCOUNTER — Emergency Department (HOSPITAL_COMMUNITY): Payer: Medicare Other

## 2014-12-24 ENCOUNTER — Encounter (HOSPITAL_COMMUNITY): Payer: Self-pay | Admitting: *Deleted

## 2014-12-24 ENCOUNTER — Inpatient Hospital Stay (HOSPITAL_COMMUNITY)
Admission: EM | Admit: 2014-12-24 | Discharge: 2015-01-02 | DRG: 871 | Disposition: E | Payer: Medicare Other | Attending: Pulmonary Disease | Admitting: Pulmonary Disease

## 2014-12-24 DIAGNOSIS — Z66 Do not resuscitate: Secondary | ICD-10-CM | POA: Diagnosis present

## 2014-12-24 DIAGNOSIS — I482 Chronic atrial fibrillation: Secondary | ICD-10-CM | POA: Diagnosis present

## 2014-12-24 DIAGNOSIS — Z800 Family history of malignant neoplasm of digestive organs: Secondary | ICD-10-CM | POA: Diagnosis not present

## 2014-12-24 DIAGNOSIS — R34 Anuria and oliguria: Secondary | ICD-10-CM | POA: Diagnosis present

## 2014-12-24 DIAGNOSIS — I129 Hypertensive chronic kidney disease with stage 1 through stage 4 chronic kidney disease, or unspecified chronic kidney disease: Secondary | ICD-10-CM | POA: Diagnosis present

## 2014-12-24 DIAGNOSIS — J189 Pneumonia, unspecified organism: Secondary | ICD-10-CM | POA: Diagnosis present

## 2014-12-24 DIAGNOSIS — Z79899 Other long term (current) drug therapy: Secondary | ICD-10-CM | POA: Diagnosis not present

## 2014-12-24 DIAGNOSIS — F0390 Unspecified dementia without behavioral disturbance: Secondary | ICD-10-CM | POA: Diagnosis present

## 2014-12-24 DIAGNOSIS — J449 Chronic obstructive pulmonary disease, unspecified: Secondary | ICD-10-CM | POA: Diagnosis present

## 2014-12-24 DIAGNOSIS — E2749 Other adrenocortical insufficiency: Secondary | ICD-10-CM | POA: Diagnosis present

## 2014-12-24 DIAGNOSIS — N189 Chronic kidney disease, unspecified: Secondary | ICD-10-CM | POA: Diagnosis present

## 2014-12-24 DIAGNOSIS — G9340 Encephalopathy, unspecified: Secondary | ICD-10-CM | POA: Diagnosis present

## 2014-12-24 DIAGNOSIS — Z7952 Long term (current) use of systemic steroids: Secondary | ICD-10-CM | POA: Diagnosis not present

## 2014-12-24 DIAGNOSIS — Z515 Encounter for palliative care: Secondary | ICD-10-CM | POA: Diagnosis not present

## 2014-12-24 DIAGNOSIS — D801 Nonfamilial hypogammaglobulinemia: Secondary | ICD-10-CM | POA: Diagnosis present

## 2014-12-24 DIAGNOSIS — R6521 Severe sepsis with septic shock: Secondary | ICD-10-CM | POA: Diagnosis present

## 2014-12-24 DIAGNOSIS — N179 Acute kidney failure, unspecified: Secondary | ICD-10-CM | POA: Diagnosis present

## 2014-12-24 DIAGNOSIS — Z880 Allergy status to penicillin: Secondary | ICD-10-CM | POA: Diagnosis not present

## 2014-12-24 DIAGNOSIS — A419 Sepsis, unspecified organism: Secondary | ICD-10-CM | POA: Diagnosis present

## 2014-12-24 DIAGNOSIS — M1990 Unspecified osteoarthritis, unspecified site: Secondary | ICD-10-CM | POA: Diagnosis present

## 2014-12-24 DIAGNOSIS — E873 Alkalosis: Secondary | ICD-10-CM | POA: Diagnosis present

## 2014-12-24 DIAGNOSIS — I69320 Aphasia following cerebral infarction: Secondary | ICD-10-CM | POA: Diagnosis not present

## 2014-12-24 DIAGNOSIS — D649 Anemia, unspecified: Secondary | ICD-10-CM | POA: Diagnosis present

## 2014-12-24 DIAGNOSIS — K219 Gastro-esophageal reflux disease without esophagitis: Secondary | ICD-10-CM | POA: Diagnosis present

## 2014-12-24 DIAGNOSIS — I5030 Unspecified diastolic (congestive) heart failure: Secondary | ICD-10-CM | POA: Diagnosis present

## 2014-12-24 DIAGNOSIS — Z823 Family history of stroke: Secondary | ICD-10-CM | POA: Diagnosis not present

## 2014-12-24 DIAGNOSIS — J9621 Acute and chronic respiratory failure with hypoxia: Secondary | ICD-10-CM | POA: Diagnosis present

## 2014-12-24 DIAGNOSIS — Y95 Nosocomial condition: Secondary | ICD-10-CM | POA: Diagnosis present

## 2014-12-24 DIAGNOSIS — Z806 Family history of leukemia: Secondary | ICD-10-CM

## 2014-12-24 DIAGNOSIS — R739 Hyperglycemia, unspecified: Secondary | ICD-10-CM | POA: Diagnosis present

## 2014-12-24 DIAGNOSIS — Z8249 Family history of ischemic heart disease and other diseases of the circulatory system: Secondary | ICD-10-CM

## 2014-12-24 DIAGNOSIS — J9601 Acute respiratory failure with hypoxia: Secondary | ICD-10-CM | POA: Diagnosis not present

## 2014-12-24 DIAGNOSIS — Z008 Encounter for other general examination: Secondary | ICD-10-CM

## 2014-12-24 DIAGNOSIS — R0602 Shortness of breath: Secondary | ICD-10-CM | POA: Diagnosis present

## 2014-12-24 LAB — CBC
HEMATOCRIT: 36.5 % (ref 36.0–46.0)
HEMOGLOBIN: 10.9 g/dL — AB (ref 12.0–15.0)
MCH: 25.2 pg — AB (ref 26.0–34.0)
MCHC: 29.9 g/dL — ABNORMAL LOW (ref 30.0–36.0)
MCV: 84.3 fL (ref 78.0–100.0)
Platelets: 554 10*3/uL — ABNORMAL HIGH (ref 150–400)
RBC: 4.33 MIL/uL (ref 3.87–5.11)
RDW: 16.3 % — ABNORMAL HIGH (ref 11.5–15.5)
WBC: 33.7 10*3/uL — ABNORMAL HIGH (ref 4.0–10.5)

## 2014-12-24 LAB — I-STAT CHEM 8, ED
BUN: 16 mg/dL (ref 6–20)
CHLORIDE: 103 mmol/L (ref 101–111)
Calcium, Ion: 1.01 mmol/L — ABNORMAL LOW (ref 1.13–1.30)
Creatinine, Ser: 2.1 mg/dL — ABNORMAL HIGH (ref 0.44–1.00)
Glucose, Bld: 160 mg/dL — ABNORMAL HIGH (ref 65–99)
HCT: 38 % (ref 36.0–46.0)
Hemoglobin: 12.9 g/dL (ref 12.0–15.0)
POTASSIUM: 3.7 mmol/L (ref 3.5–5.1)
SODIUM: 137 mmol/L (ref 135–145)
TCO2: 20 mmol/L (ref 0–100)

## 2014-12-24 LAB — BASIC METABOLIC PANEL
Anion gap: 14 (ref 5–15)
BUN: 16 mg/dL (ref 6–20)
CO2: 24 mmol/L (ref 22–32)
Calcium: 8.7 mg/dL — ABNORMAL LOW (ref 8.9–10.3)
Chloride: 101 mmol/L (ref 101–111)
Creatinine, Ser: 2.18 mg/dL — ABNORMAL HIGH (ref 0.44–1.00)
GFR calc Af Amer: 22 mL/min — ABNORMAL LOW (ref >60)
GFR, EST NON AFRICAN AMERICAN: 19 mL/min — AB (ref >60)
GLUCOSE: 167 mg/dL — AB (ref 65–99)
Potassium: 3.7 mmol/L (ref 3.5–5.1)
SODIUM: 139 mmol/L (ref 135–145)

## 2014-12-24 LAB — BLOOD GAS, ARTERIAL
Acid-Base Excess: 1.1 mmol/L (ref 0.0–2.0)
Bicarbonate: 22.5 mEq/L (ref 20.0–24.0)
Drawn by: 232811
FIO2: 1 %
O2 Saturation: 94.4 %
PATIENT TEMPERATURE: 100.2
PCO2 ART: 27.5 mmHg — AB (ref 35.0–45.0)
PH ART: 7.527 — AB (ref 7.350–7.450)
TCO2: 20.1 mmol/L (ref 0–100)
pO2, Arterial: 72.7 mmHg — ABNORMAL LOW (ref 80.0–100.0)

## 2014-12-24 LAB — URINALYSIS, ROUTINE W REFLEX MICROSCOPIC
BILIRUBIN URINE: NEGATIVE
Glucose, UA: NEGATIVE mg/dL
Hgb urine dipstick: NEGATIVE
KETONES UR: NEGATIVE mg/dL
LEUKOCYTES UA: NEGATIVE
NITRITE: NEGATIVE
PROTEIN: NEGATIVE mg/dL
Specific Gravity, Urine: 1.011 (ref 1.005–1.030)
UROBILINOGEN UA: 1 mg/dL (ref 0.0–1.0)
pH: 7 (ref 5.0–8.0)

## 2014-12-24 LAB — RAPID URINE DRUG SCREEN, HOSP PERFORMED
AMPHETAMINES: NOT DETECTED
BARBITURATES: NOT DETECTED
Benzodiazepines: NOT DETECTED
Cocaine: NOT DETECTED
Opiates: NOT DETECTED
Tetrahydrocannabinol: NOT DETECTED

## 2014-12-24 LAB — DIFFERENTIAL
BASOS PCT: 0 % (ref 0–1)
Basophils Absolute: 0 10*3/uL (ref 0.0–0.1)
EOS ABS: 0 10*3/uL (ref 0.0–0.7)
EOS PCT: 0 % (ref 0–5)
LYMPHS ABS: 0.7 10*3/uL (ref 0.7–4.0)
Lymphocytes Relative: 2 % — ABNORMAL LOW (ref 12–46)
MONO ABS: 1.3 10*3/uL — AB (ref 0.1–1.0)
Monocytes Relative: 4 % (ref 3–12)
Neutro Abs: 31.7 10*3/uL — ABNORMAL HIGH (ref 1.7–7.7)
Neutrophils Relative %: 94 % — ABNORMAL HIGH (ref 43–77)
WBC Morphology: INCREASED

## 2014-12-24 LAB — I-STAT TROPONIN, ED: Troponin i, poc: 0.01 ng/mL (ref 0.00–0.08)

## 2014-12-24 LAB — PROTIME-INR
INR: 1.14 (ref 0.00–1.49)
PROTHROMBIN TIME: 14.8 s (ref 11.6–15.2)

## 2014-12-24 LAB — I-STAT CG4 LACTIC ACID, ED
Lactic Acid, Venous: 7.07 mmol/L (ref 0.5–2.0)
Lactic Acid, Venous: 8.16 mmol/L (ref 0.5–2.0)

## 2014-12-24 LAB — BRAIN NATRIURETIC PEPTIDE: B Natriuretic Peptide: 522.9 pg/mL — ABNORMAL HIGH (ref 0.0–100.0)

## 2014-12-24 LAB — POC OCCULT BLOOD, ED: Fecal Occult Bld: POSITIVE — AB

## 2014-12-24 LAB — APTT: APTT: 23 s — AB (ref 24–37)

## 2014-12-24 MED ORDER — SODIUM CHLORIDE 0.9 % IV SOLN: INTRAVENOUS | Status: DC

## 2014-12-24 MED ORDER — SODIUM CHLORIDE 0.9 % IV SOLN
Freq: Once | INTRAVENOUS | Status: AC
  Administered 2014-12-24: 22:00:00 via INTRAVENOUS

## 2014-12-24 MED ORDER — IPRATROPIUM-ALBUTEROL 0.5-2.5 (3) MG/3ML IN SOLN
3.0000 mL | Freq: Four times a day (QID) | RESPIRATORY_TRACT | Status: DC
  Administered 2014-12-24 – 2014-12-25 (×2): 3 mL via RESPIRATORY_TRACT
  Filled 2014-12-24 (×2): qty 3

## 2014-12-24 MED ORDER — VANCOMYCIN HCL IN DEXTROSE 1-5 GM/200ML-% IV SOLN
1000.0000 mg | Freq: Once | INTRAVENOUS | Status: DC
  Administered 2014-12-24: 1000 mg via INTRAVENOUS
  Filled 2014-12-24: qty 200

## 2014-12-24 MED ORDER — PANTOPRAZOLE SODIUM 40 MG IV SOLR
40.0000 mg | INTRAVENOUS | Status: DC
  Administered 2014-12-25: 40 mg via INTRAVENOUS
  Filled 2014-12-24: qty 40

## 2014-12-24 MED ORDER — VANCOMYCIN HCL IN DEXTROSE 750-5 MG/150ML-% IV SOLN: 750.0000 mg | INTRAVENOUS | Status: DC

## 2014-12-24 MED ORDER — SODIUM CHLORIDE 0.9 % IV BOLUS (SEPSIS)
1000.0000 mL | Freq: Once | INTRAVENOUS | Status: AC
  Administered 2014-12-24: 1000 mL via INTRAVENOUS

## 2014-12-24 MED ORDER — DEXTROSE 5 % IV SOLN
1.0000 g | INTRAVENOUS | Status: AC
  Administered 2014-12-24: 1 g via INTRAVENOUS
  Filled 2014-12-24: qty 1

## 2014-12-24 MED ORDER — ATROPINE SULFATE 1 % OP SOLN
4.0000 [drp] | OPHTHALMIC | Status: DC | PRN
  Filled 2014-12-24: qty 2

## 2014-12-24 MED ORDER — LORAZEPAM 2 MG/ML IJ SOLN: 1.0000 mg | INTRAMUSCULAR | Status: DC | PRN

## 2014-12-24 MED ORDER — SODIUM CHLORIDE 0.9 % IV BOLUS (SEPSIS)
500.0000 mL | INTRAVENOUS | Status: AC
  Administered 2014-12-24: 500 mL via INTRAVENOUS

## 2014-12-24 MED ORDER — VANCOMYCIN HCL IN DEXTROSE 1-5 GM/200ML-% IV SOLN
1000.0000 mg | INTRAVENOUS | Status: DC
Start: 2014-12-24 — End: 2014-12-24

## 2014-12-24 MED ORDER — BUDESONIDE 0.25 MG/2ML IN SUSP
0.2500 mg | Freq: Four times a day (QID) | RESPIRATORY_TRACT | Status: DC
  Administered 2014-12-24 – 2014-12-25 (×2): 0.25 mg via RESPIRATORY_TRACT
  Filled 2014-12-24 (×2): qty 2

## 2014-12-24 MED ORDER — SODIUM CHLORIDE 0.9 % IV SOLN
1.0000 g | Freq: Once | INTRAVENOUS | Status: AC
  Administered 2014-12-25: 1 g via INTRAVENOUS
  Filled 2014-12-24: qty 10

## 2014-12-24 MED ORDER — DEXTROSE 5 % IV SOLN
2.0000 g | Freq: Once | INTRAVENOUS | Status: DC
  Filled 2014-12-24: qty 2

## 2014-12-24 MED ORDER — MORPHINE SULFATE 2 MG/ML IJ SOLN
1.0000 mg | INTRAMUSCULAR | Status: DC | PRN
  Administered 2014-12-25: 1 mg via INTRAVENOUS
  Filled 2014-12-24 (×2): qty 1

## 2014-12-24 MED ORDER — HYDROCORTISONE NA SUCCINATE PF 100 MG IJ SOLR
50.0000 mg | Freq: Four times a day (QID) | INTRAMUSCULAR | Status: DC
Start: 2014-12-24 — End: 2014-12-25
  Administered 2014-12-24: 23:00:00 via INTRAVENOUS
  Administered 2014-12-25: 50 mg via INTRAVENOUS
  Filled 2014-12-24 (×5): qty 1
  Filled 2014-12-24: qty 2

## 2014-12-24 MED ORDER — LEVALBUTEROL HCL 0.63 MG/3ML IN NEBU: 0.6300 mg | INHALATION_SOLUTION | RESPIRATORY_TRACT | Status: DC | PRN

## 2014-12-24 MED ORDER — AZTREONAM 1 G IJ SOLR
500.0000 mg | Freq: Three times a day (TID) | INTRAMUSCULAR | Status: DC
  Administered 2014-12-25: 500 mg via INTRAVENOUS
  Filled 2014-12-24 (×2): qty 0.5

## 2014-12-24 NOTE — ED Provider Notes (Signed)
CSN: [id number]     Arrival date & time 12/10/2014  1944 History   First MD Initiated Contact with Patient 12/15/2014 1952     Chief Complaint  Patient presents with  . Shortness of Breath     (Consider location/radiation/quality/duration/timing/severity/associated sxs/prior Treatment) HPI Comments: Patient with past medical history of stroke with residual aphasia and memory impairment, CHF, A. fib, discontinued Coumadin 3 weeks ago secondary to GI bleed, presents to the emergency department with chief complaint of hypoxia and weakness. Patient is currently staying at a nursing facility. She is accompanied by her daughter, who states that she found the patient to be weak and tired today. She states that the nursing facility wanted to get a chest x-ray to rule out pneumonia, but ultimately send the patient to the emergency department for evaluation. When EMS arrived, the patient was hypoxic to 80%. When she arrived in the emergency department she was noted to be hypotensive to the 70s. She was oxygenating adequately on a nonrebreather. Patient is unable to contribute to her history secondary to altered mental status. Level V caveat applies.  The history is provided by the EMS personnel and a relative. No language interpreter was used.    Past Medical History  Diagnosis Date  . Atrial fibrillation   . Hypertension   . Arthritis   . GERD (gastroesophageal reflux disease)   . COPD (chronic obstructive pulmonary disease)   . PNA (pneumonia)   . Anemia due to GI blood loss 07/11/2011  . 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 Stroke 2009    aphasia and memory impairment as residual  . Hypoxia   . Atrial fibrillation, permanent, rate controlled on coumadin 05/10/2011  . Hypogammaglobulinemia 06/01/2012    Immunoglobulins 05/01/2012-normal IgG M., normal IgA,  low IgG [phone]).   . Dementia-stage 4-5  06/16/2011  . CVA history 05/10/2011  . CHF (congestive heart failure) 77/93/9030    diastolic  . History of nuclear stress test 2009    normal dobutamine myoview   Past Surgical History  Procedure Laterality Date  . Hip fracture surgery    . Cholecystectomy    . Knee surgery    . Cataract extraction    . Transthoracic echocardiogram  06/2011    EF 55-60%; mild LVH; LA & RA mod dilated; mod TR   Family History  Problem Relation Age of Onset  . Heart disease Mother   . Rectal cancer Mother   . Leukemia Father   . Heart failure Mother   . Stroke Mother   . Cancer Maternal Grandmother   . Hypertension Son   . Hypertension Daughter    History  Substance Use Topics  . Smoking status: Never Smoker   . Smokeless tobacco: Never Used  . Alcohol Use: No   OB History    No data available     Review of Systems  Unable to perform ROS: Mental status change      Allergies  Amoxicillin and Penicillins  Home Medications   Prior to Admission medications   Medication Sig Start Date End Date Taking? Authorizing Provider  albuterol (PROVENTIL) (2.5 MG/3ML) 0.083% nebulizer solution Take 2.5 mg by nebulization every 6 (six) hours as needed for wheezing or shortness of breath.  07/14/14   Historical Provider, MD  budesonide (PULMICORT) 0.5 MG/2ML nebulizer solution Take 0.5 mg by nebulization 2 (two) times daily.  06/30/14   Historical Provider, MD  diltiazem (DILACOR XR) 180 MG 24 hr capsule Take 180 mg by mouth daily before breakfast.     Historical Provider, MD  fluticasone (FLONASE) 50 MCG/ACT nasal spray Place 2 sprays into both nostrils 2 (two) times daily as needed for allergies.     Historical Provider, MD  furosemide (LASIX) 40 MG tablet Take 1 tablet (40 mg total) by mouth daily. Patient taking differently: Take 40 mg by mouth daily. For CHF 12/02/14   Theodis Blaze, MD  guaiFENesin (MUCINEX) 600 MG 12 hr tablet Take 1,200 mg by mouth 2 (two) times daily as needed for cough or  to loosen phlegm.    Historical Provider, MD  loratadine (ALLERGY) 10 MG tablet Take 10 mg by mouth daily. For Allergies    Historical Provider, MD  pantoprazole (PROTONIX) 40 MG tablet Take 40 mg by mouth daily. For GERD    Historical Provider, MD  potassium chloride (K-DUR) 10 MEQ tablet Take 1 tablet (10 mEq total) by mouth daily. Patient taking differently: Take 10 mEq by mouth daily. For Hypokalemia 12/02/14   Theodis Blaze, MD  predniSONE (DELTASONE) 5 MG tablet Take 10 mg by mouth every other day. For COPD    Historical Provider, MD   BP 72/41 mmHg  Pulse 112  Temp(Src) 97.8 F (36.6 C) (Oral)  Resp 22  SpO2 94% Physical Exam  Constitutional:  Frail, and thin  HENT:  Head: Normocephalic and atraumatic.  Eyes: Conjunctivae and EOM are normal. Pupils are equal, round, and reactive to light.  Neck: Normal range of motion. Neck supple.  Cardiovascular: Normal rate and regular rhythm.  Exam reveals no gallop and no friction rub.   No murmur heard. Pulmonary/Chest: Effort normal. No respiratory distress. She has no wheezes. She has rales. She exhibits no tenderness.  Some crackles left lungs  Abdominal: Soft. Bowel sounds are normal. She exhibits no distension and no mass. There is no tenderness. There is no rebound and no guarding.  Musculoskeletal: Normal range of motion. She exhibits no edema or tenderness.  Neurological:  Responds to commands, but unable to communicate, baseline a aphasia secondary to stroke  Skin: Skin is warm and dry.  Psychiatric: She has a normal mood and affect. Her behavior is normal. Judgment and thought content normal.  Nursing note and vitals reviewed.   ED Course  Procedures (including critical care time) Results for orders placed or performed during the hospital encounter of 57/84/69  Basic metabolic panel  (if pt has PMH of COPD)  Result Value Ref Range   Sodium 139 135 - 145 mmol/L   Potassium 3.7 3.5 - 5.1 mmol/L   Chloride 101 101 - 111  mmol/L   CO2 24 22 - 32 mmol/L   Glucose, Bld 167 (H) 65 - 99 mg/dL   BUN 16 6 - 20 mg/dL   Creatinine, Ser 2.18 (H) 0.44 - 1.00 mg/dL   Calcium 8.7 (L) 8.9 - 10.3 mg/dL   GFR calc non Af Amer 19 (L) >60 mL/min   GFR calc Af Amer 22 (L) >60 mL/min   Anion gap 14 5 - 15  CBC  (if pt has PMH of COPD)  Result Value Ref Range   WBC 33.7 (H) 4.0 - 10.5 K/uL   RBC 4.33 3.87 - 5.11 MIL/uL   Hemoglobin 10.9 (L) 12.0 - 15.0 g/dL   HCT 36.5 36.0 - 46.0 %   MCV 84.3 78.0 - 100.0 fL   MCH 25.2 (L) 26.0 - 34.0 pg  MCHC 29.9 (L) 30.0 - 36.0 g/dL   RDW 16.3 (H) 11.5 - 15.5 %   Platelets 554 (H) 150 - 400 K/uL  Protime-INR  Result Value Ref Range   Prothrombin Time 14.8 11.6 - 15.2 seconds   INR 1.14 0.00 - 1.49  APTT  Result Value Ref Range   aPTT 23 (L) 24 - 37 seconds  Differential  Result Value Ref Range   Neutrophils Relative % 94 (H) 43 - 77 %   Lymphocytes Relative 2 (L) 12 - 46 %   Monocytes Relative 4 3 - 12 %   Eosinophils Relative 0 0 - 5 %   Basophils Relative 0 0 - 1 %   Neutro Abs 31.7 (H) 1.7 - 7.7 K/uL   Lymphs Abs 0.7 0.7 - 4.0 K/uL   Monocytes Absolute 1.3 (H) 0.1 - 1.0 K/uL   Eosinophils Absolute 0.0 0.0 - 0.7 K/uL   Basophils Absolute 0.0 0.0 - 0.1 K/uL   WBC Morphology INCREASED BANDS (>20% BANDS)   Urine rapid drug screen (hosp performed)not at [facility]  Result Value Ref Range   Opiates NONE DETECTED NONE DETECTED   Cocaine NONE DETECTED NONE DETECTED   Benzodiazepines NONE DETECTED NONE DETECTED   Amphetamines NONE DETECTED NONE DETECTED   Tetrahydrocannabinol NONE DETECTED NONE DETECTED   Barbiturates NONE DETECTED NONE DETECTED  Urinalysis, Routine w reflex microscopic (not at [facility])  Result Value Ref Range   Color, Urine YELLOW YELLOW   APPearance CLEAR CLEAR   Specific Gravity, Urine 1.011 1.005 - 1.030   pH 7.0 5.0 - 8.0   Glucose, UA NEGATIVE NEGATIVE mg/dL   Hgb urine dipstick NEGATIVE NEGATIVE   Bilirubin Urine NEGATIVE NEGATIVE   Ketones, ur  NEGATIVE NEGATIVE mg/dL   Protein, ur NEGATIVE NEGATIVE mg/dL   Urobilinogen, UA 1.0 0.0 - 1.0 mg/dL   Nitrite NEGATIVE NEGATIVE   Leukocytes, UA NEGATIVE NEGATIVE  Blood gas, arterial  Result Value Ref Range   FIO2 1.00 %   Delivery systems NON-REBREATHER OXYGEN MASK    pH, Arterial 7.527 (H) 7.350 - 7.450   pCO2 arterial 27.5 (L) 35.0 - 45.0 mmHg   pO2, Arterial 72.7 (L) 80.0 - 100.0 mmHg   Bicarbonate 22.5 20.0 - 24.0 mEq/L   TCO2 20.1 0 - 100 mmol/L   Acid-Base Excess 1.1 0.0 - 2.0 mmol/L   O2 Saturation 94.4 %   Patient temperature 100.2    Collection site REVIEWED BY    Drawn by [id number]    Sample type ARTERIAL    Allens test (pass/fail) PASS PASS  Brain natriuretic peptide  Result Value Ref Range   B Natriuretic Peptide 522.9 (H) 0.0 - 100.0 pg/mL  I-stat troponin, ED  (if patient has PMH of COPD)  not at [facility]  Result Value Ref Range   Troponin i, poc 0.01 0.00 - 0.08 ng/mL   Comment 3          I-Stat Chem 8, ED  (not at [facility], [facility])  Result Value Ref Range   Sodium 137 135 - 145 mmol/L   Potassium 3.7 3.5 - 5.1 mmol/L   Chloride 103 101 - 111 mmol/L   BUN 16 6 - 20 mg/dL   Creatinine, Ser 2.10 (H) 0.44 - 1.00 mg/dL   Glucose, Bld 160 (H) 65 - 99 mg/dL   Calcium, Ion 1.01 (L) 1.13 - 1.30 mmol/L   TCO2 20 0 - 100 mmol/L   Hemoglobin 12.9 12.0 - 15.0 g/dL   HCT  38.0 36.0 - 46.0 %  I-Stat CG4 Lactic Acid, ED  (not at  [facility])  Result Value Ref Range   Lactic Acid, Venous 7.07 (HH) 0.5 - 2.0 mmol/L  POC occult blood, ED RN will collect  Result Value Ref Range   Fecal Occult Bld POSITIVE (A) NEGATIVE   Ct Head Wo Contrast  12/06/2014   CLINICAL DATA:  Acute onset of nausea and vomiting. Hypoxia. Initial encounter.  EXAM: CT HEAD WITHOUT CONTRAST  TECHNIQUE: Contiguous axial images were obtained from the base of the skull through the vertex without intravenous contrast.  COMPARISON:  CT of the head performed 04/15/2014, and MRI of the brain performed 04/16/2014   FINDINGS: There is no evidence of acute infarction, mass lesion, or intra- or extra-axial hemorrhage on CT.  A chronic left MCA territory infarct again noted, with associated encephalomalacia. Scattered periventricular and subcortical white matter change likely reflects small vessel ischemic microangiopathy. Prominence of the ventricles and sulci reflects mild to moderate cortical volume loss. Cerebellar atrophy is noted.  The brainstem and fourth ventricle are within normal limits. No mass effect or midline shift is seen.  There is no evidence of fracture; visualized osseous structures are unremarkable in appearance. The orbits are within normal limits. Mild mucosal thickening is noted at the left maxillary sinus. The remaining paranasal sinuses and mastoid air cells are well-aerated. No significant soft tissue abnormalities are seen.  IMPRESSION: 1. No acute intracranial pathology seen on CT. 2. Chronic left MCA territory infarct again noted, with associated encephalomalacia. 3. Mild to moderate cortical volume loss and scattered small vessel ischemic microangiopathy. 4. Mild mucosal thickening at the left maxillary sinus.   Electronically Signed   By: Garald Balding M.D.   On: 12/16/2014 21:40   Dg Chest Port 1 View  12/20/2014   CLINICAL DATA:  Nausea and vomiting. Hypoxia. Marked shortness of breath.  EXAM: PORTABLE CHEST - 1 VIEW  COMPARISON:  11/29/2014 and chest CT dated 10/06/2012  FINDINGS: There is a new consolidative infiltrate in the left lower lobe with a patchy infiltrate in the left midzone. Large hiatal hernia. Right lung is clear. Heart size and vascularity are normal. Extensive calcification in the thoracic aorta. Diffuse osteopenia. Old deformity of the proximal right humerus. Emphysema.  IMPRESSION: New infiltrates at the left lung base and left midzone.  Emphysema.   Electronically Signed   By: Lorriane Shire M.D.   On: 12/12/2014 21:17   Dg Abd Acute W/chest  11/29/2014   CLINICAL DATA:   Lethargy, lack of appetite nausea vomiting diarrhea  EXAM: DG ABDOMEN ACUTE W/ 1V CHEST  COMPARISON:  10/13/2014  FINDINGS: Stable mild cardiac enlargement. Uncoiling and calcification of the aorta. Vascular pattern normal. Lungs clear. No free air. Left shoulder arthritis.  No pneumoperitoneum identified on decubitus film. No abnormally dilated loops of bowel. No abnormal air-fluid levels. No abnormal opacities with nonspecific calcifications observed diffusely over the abdomen.  IMPRESSION: No acute abnormalities in the thorax or abdomen.   Electronically Signed   By: Skipper Cliche M.D.   On: 11/29/2014 12:05     Imaging Review No results found.   EKG Interpretation None      MDM   Final diagnoses:  Encounter for medical assessment  Septic shock  HCAP (healthcare-associated pneumonia)    Patient immediately seen by Dr. Jeneen Rinks after my initial assessment.   Patient from nursing facility with hypoxia. Was reportedly normal yesterday, but has had more weakness and confusion today. Will check  labs, chest x-ray, UA, and CT. Vital signs concerning for septic shock, will start aggressive fluid therapy. Patient has received nearly 3 L of fluid.  Lactic acid is 7, leukocytosis is 33, chest x-ray remarkable for infiltrates. Will treat empirically for HCAP.  As the patient remains hypotensive after 3 L, will consult critical care. Discussed patient with Dr. Curt Jews, who will admit the patient. Patient is reportedly DNR/DNI, but is willing to have central line and pressors if needed.  Medications  aztreonam (AZACTAM) 1 g in dextrose 5 % 50 mL IVPB (0 g Intravenous Stopped 12/26/2014 2219)    Followed by  aztreonam (AZACTAM) 500 mg in dextrose 5 % 50 mL IVPB (not administered)  vancomycin (VANCOCIN) IVPB 750 mg/150 ml premix (not administered)  hydrocortisone sodium succinate (SOLU-CORTEF) 100 MG injection 50 mg (not administered)  ipratropium-albuterol (DUONEB) 0.5-2.5 (3) MG/3ML nebulizer  solution 3 mL (not administered)  sodium chloride 0.9 % bolus 1,000 mL (0 mLs Intravenous Stopped 12/21/2014 2150)    Followed by  sodium chloride 0.9 % bolus 500 mL (0 mLs Intravenous Stopped 12/10/2014 2200)  0.9 %  sodium chloride infusion ( Intravenous New Bag/Given 12/14/2014 2229)    CRITICAL CARE Performed by: Montine Circle   Total critical care time: 45 Critical care time was exclusive of separately billable procedures and treating other patients.  Critical care was necessary to treat or prevent imminent or life-threatening deterioration.  Critical care was time spent personally by me on the following activities: development of treatment plan with patient and/or surrogate as well as nursing, discussions with consultants, evaluation of patient's response to treatment, examination of patient, obtaining history from patient or surrogate, ordering and performing treatments and interventions, ordering and review of laboratory studies, ordering and review of radiographic studies, pulse oximetry and re-evaluation of patient's condition.   Montine Circle, PA-C 12/18/2014 2254  Montine Circle, PA-C 12/10/2014 2255  Tanna Furry, MD 01/02/15 [phone]

## 2014-12-24 NOTE — H&P (Signed)
PULMONARY / CRITICAL CARE MEDICINE   Name: Angelica [name] MRN: [id number] DOB: Jun 20, 1923    ADMISSION DATE:  12/23/2014 CONSULTATION DATE:  12/23/2013  REFERRING MD :  Dr. Jeneen Rinks emergency department physician  CHIEF COMPLAINT:  Shortness of breath  INITIAL PRESENTATION: 81 year old female admitted from the [facility] emergency department on 12/16/2014 with healthcare associated pneumonia and septic shock.  STUDIES:  6/22 CT head >>> no acute intracranial abnormality.  SIGNIFICANT EVENTS: 6/22 - admit.   HISTORY OF PRESENT ILLNESS:  This is an 81 year old female who had been admitted to [facility] long hospital in June 2016 for a urinary tract infection (Klebsiella) and acute on chronic kidney failure who came back to the emergency department on 12/13/2014 with shortness of breath. Per chart review, after last admission, she was discharged to a skilled nursing facility for further rehabilitation efforts.  Prior to that admit, she had been living in ALF. On 6/22, daughter went to visit pt and noticed that she was weak and not communicating normally.  Daughter had seen her 1 day prior and pt was normal at that time.  Daughter therefore requested transfer to ED for further evaluation.   In ED, she was tachycardic, hypoxic, and had AMS.  CXR revealed new left sided infiltrates c/w HCAP .  Labs significant for lactate of 7 and WBC 33K with left shift.  She remained hypotensive after 2L IVF and PCCM was called for admission with possible central line insertion and vaso pressor support.  After extensive discussion with pt's daughter, pt has had poor quality of life recently given that she has been in SNF and has seemed very depressed.  Daughter called her brother (pt's son) and discussed current situation with him.  They both felt that pt would not want aggressive measures under these circumstances and have therefore requested that we do not attempt resuscitation but rather focus on pt  comfort.   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  has past surgical history that includes Hip fracture surgery; Cholecystectomy; Knee surgery; Cataract extraction; and transthoracic echocardiogram (06/2011). Prior to Admission medications   Medication Sig Start Date End Date Taking? Authorizing Provider  albuterol (PROVENTIL) (2.5 MG/3ML) 0.083% nebulizer solution Take 2.5 mg by nebulization every 6 (six) hours as needed for wheezing or shortness of breath.  07/14/14  Yes Historical Provider, MD  budesonide (PULMICORT) 0.5 MG/2ML nebulizer solution Take 0.5 mg by nebulization 2 (two) times daily.  06/30/14  Yes Historical Provider, MD  diltiazem (DILACOR XR) 180 MG 24 hr capsule Take 180 mg by mouth daily before breakfast.    Yes Historical Provider, MD  fluticasone (FLONASE) 50 MCG/ACT nasal spray Place 2 sprays into both nostrils 2 (two) times daily as needed for allergies.    Yes Historical Provider, MD  furosemide (LASIX) 40 MG tablet Take 1 tablet (40 mg total) by mouth daily. Patient taking differently: Take 40 mg by mouth daily. For CHF 12/02/14  Yes Angelica Blaze, MD  guaiFENesin (MUCINEX) 600 MG 12 hr tablet Take 1,200 mg by mouth 2 (two) times daily as  needed for cough or to loosen phlegm.   Yes Historical Provider, MD  loratadine (ALLERGY) 10 MG tablet Take 10 mg by mouth daily. For  Allergies   Yes Historical Provider, MD  pantoprazole (PROTONIX) 40 MG tablet Take 40 mg by mouth daily. For GERD   Yes Historical Provider, MD  potassium chloride (K-DUR) 10 MEQ tablet Take 1 tablet (10 mEq total) by mouth daily. Patient taking differently: Take 10 mEq by mouth daily. For Hypokalemia 12/02/14  Yes Angelica Blaze, MD  predniSONE (DELTASONE) 5 MG tablet Take 10 mg by mouth every other day. For COPD   Yes Historical Provider, MD   Allergies  Allergen Reactions  . Amoxicillin Hives  . Penicillins Hives    FAMILY HISTORY:  indicated that her mother is deceased. She indicated that her father is deceased. She indicated that her maternal grandmother is deceased.  SOCIAL HISTORY:  reports that she has never smoked. She has never used smokeless tobacco. She reports that she does not drink alcohol or use illicit drugs.  REVIEW OF SYSTEMS:  Cannot obtain due to confusion  SUBJECTIVE:   VITAL SIGNS: Temp:  [97.8 F (36.6 C)-101.8 F (38.8 C)] 101.8 F (38.8 C) (06/22 2115) Pulse Rate:  [112-122] 122 (06/22 2201) Resp:  [16-42] 35 (06/22 2230) BP: (47-98)/(23-79) 74/35 mmHg (06/22 2230) SpO2:  [76 %-94 %] 76 % (06/22 2201) HEMODYNAMICS:   VENTILATOR SETTINGS:   INTAKE / OUTPUT: No intake or output data in the 24 hours ending 12/10/2014 2237  PHYSICAL EXAMINATION: General: Elderly chronically ill appearing female, resting in bed, in NAD. Neuro: Awake but not able to answer questions or follow commands. HEENT: /AT. PERRL, arcus senilis. Cardiovascular: Tachy, regular, no M/R/G.  Lungs: Respirations shallow and rapid with paradoxical breathing.  Mild rhonchi left base. Abdomen: BS x 4, soft, NT/ND.  Musculoskeletal: No gross deformities, no edema.  Skin: Intact, warm, no rashes.   LABS:  CBC  Recent Labs Lab 12/13/2014 2035 12/28/2014 2046  WBC 33.7*  --   HGB 10.9* 12.9  HCT 36.5 38.0  PLT 554*  --    Coag's  Recent Labs Lab 12/11/2014 2035  APTT 23*  INR  1.14   BMET  Recent Labs Lab 12/19/2014 2035 12/21/2014 2046  NA 139 137  K 3.7 3.7  CL 101 103  CO2 24  --   BUN 16 16  CREATININE 2.18* 2.10*  GLUCOSE 167* 160*   Electrolytes  Recent Labs Lab 12/15/2014 2035  CALCIUM 8.7*   Sepsis Markers  Recent Labs Lab 01/01/2015 2049  LATICACIDVEN 7.07*   ABG  Recent Labs Lab 12/30/2014 2030  PHART 7.527*  PCO2ART 27.5*  PO2ART 72.7*   Liver Enzymes No results for input(s): AST, ALT, ALKPHOS, BILITOT, ALBUMIN in the last 168 hours. Cardiac Enzymes No results for input(s): TROPONINI, PROBNP in the last 168 hours. Glucose No results for input(s): GLUCAP in the last 168 hours.  Imaging Ct Head Wo Contrast  12/24/2014   CLINICAL DATA:  Acute onset of nausea and vomiting. Hypoxia. Initial encounter.  EXAM: CT HEAD WITHOUT CONTRAST  TECHNIQUE: Contiguous axial images were obtained from the base of the skull through the vertex without intravenous contrast.  COMPARISON:  CT of the head performed 04/15/2014, and MRI of the brain performed 04/16/2014  FINDINGS: There is no evidence of acute infarction, mass lesion, or intra- or extra-axial hemorrhage on CT.  A chronic left MCA territory infarct again noted, with associated encephalomalacia. Scattered periventricular and subcortical  white matter change likely reflects small vessel ischemic microangiopathy. Prominence of the ventricles and sulci reflects mild to moderate cortical volume loss. Cerebellar atrophy is noted.  The brainstem and fourth ventricle are within normal limits. No mass effect or midline shift is seen.  There is no evidence of fracture; visualized osseous structures are unremarkable in appearance. The orbits are within normal limits. Mild mucosal thickening is noted at the left maxillary sinus. The remaining paranasal sinuses and mastoid air cells are well-aerated. No significant soft tissue abnormalities are seen.  IMPRESSION: 1. No acute intracranial pathology seen on CT. 2.  Chronic left MCA territory infarct again noted, with associated encephalomalacia. 3. Mild to moderate cortical volume loss and scattered small vessel ischemic microangiopathy. 4. Mild mucosal thickening at the left maxillary sinus.   Electronically Signed   By: Angelica [name] M.D.   On: 12/18/2014 21:40   Dg Chest Port 1 View  12/30/2014   CLINICAL DATA:  Nausea and vomiting. Hypoxia. Marked shortness of breath.  EXAM: PORTABLE CHEST - 1 VIEW  COMPARISON:  11/29/2014 and chest CT dated 10/06/2012  FINDINGS: There is a new consolidative infiltrate in the left lower lobe with a patchy infiltrate in the left midzone. Large hiatal hernia. Right lung is clear. Heart size and vascularity are normal. Extensive calcification in the thoracic aorta. Diffuse osteopenia. Old deformity of the proximal right humerus. Emphysema.  IMPRESSION: New infiltrates at the left lung base and left midzone.  Emphysema.   Electronically Signed   By: Angelica [name] M.D.   On: 12/07/2014 21:17     ASSESSMENT / PLAN:  PULMONARY A:  Acute respiratory failure with hypoxemia due to healthcare associated pneumonia. COPD - on chronic prednisone. PAH by echo - PAP 39 from echo Oct 2015. DNI Status. P:   Supplemental oxygen as needed to maintain O2 saturation greater than 90%. DuoNebs / Levalbuterol / Budesonide. Abx per ID section. CXR in AM. DO NOT RESUSCITATE / INTUBATE.  CARDIOVASCULAR A:   Septic shock to to HCAP. Chronic atrial fibrillation, currently not on anticoagulation per pt and family decision (hx of GI bleed). Hx HTN, dCHF (echo from Oct 2015 with EF 55-60%, PAP 39). P:  Continue MIVF at 75 (caution with aggressive IVF resuscitation given dCHF, hypoxemia, and increased work of breathing). Start stress dose steroid considering chronic prednisone use. Trend lactate. Hold outpatient diltiazem, furosemide.  RENAL A:   Acute on chronic renal insufficiency due to septic shock. Hypocalcemia. P:   Continue  gentle hydration, treat shock. Monitor BMET and UOP. Replace electrolytes as needed. 1g Ca gluconate.  GASTROINTESTINAL A:   GERD. No acute issues. P:   Continue outpatient PPI. Maintain nothing by mouth for now.  HEMATOLOGIC A:   Chronic anemia, not bleeding. P:  Monitor for bleeding . Transfuse for Hgb < 7. CBC in AM.  INFECTIOUS A:   Septic shock due to healthcare associated pneumonia. P:   BCx2  6/22 > UC  6/22 > Abx: Vancomycin, start date 6/22, day 1/x. Abx: Aztreonam, start date 6/22, day 1/x.  ENDOCRINE A:   Hyperglycemia on BMP. P:   SSI if glucose consistently > 180.  NEUROLOGIC A:   Dementia. Acute encephalopathy in setting of sepsis. P:   Minimize sedating medications. Frequent orientation.   Family Updates: Daughter at bedside.  I have had extensive discussions withher. We discussed Mrs. Waid's current circumstances and organ failures. We also discussed patient's prior wishes under circumstances such as this. Daughter has called her  brother to discuss current situation, and they have decided not to perform intubation for respiratory failure and / or resuscitation if arrest were to occur  They have instead requested that we focus on Mrs. Chacko's comfort.  We will respect these wishes and admit Mrs. Frett into the hospital for supplemental O2, gentle IVF hydration, and abx for HCAP.  Daughter understands that Mrs. Asleson has a poor prognosis.  Inter-disciplinary family meet or Palliative Care meeting due by: 12/30/14.  CC time:  40 minutes.  Will call [facility] and ask them to take over Mrs. Diss's care starting AM 2014/12/26.   Angelica [name], [place] - [facility] Angel[name] Pager: [phone]  or [phone] 12/18/2014, 11:43 PM   PCCM ATTENDING: I have reviewed pt's initial presentation, consultants notes and hospital database in detail.  The above assessment and plan was formulated under my direction.  I had the  opportunity to meet with family and provide update. On my evaluation, pt was comfortable. Blood culture positive and abx were adjusted. Subsequently, pt passed away peacefully   Angelica Border, MD;  PCCM service; [place] [phone]

## 2014-12-24 NOTE — Progress Notes (Signed)
ANTIBIOTIC CONSULT NOTE - INITIAL  Pharmacy Consult for Aztreonam / Vancomycin Indication: Sepsis  Allergies  Allergen Reactions  . Amoxicillin Hives  . Penicillins Hives    Patient Measurements:   Adjusted Body Weight:   Vital Signs: Temp: 97.8 F (36.6 C) (06/22 1951) Temp Source: Oral (06/22 1951) BP: 72/41 mmHg (06/22 1951) Pulse Rate: 112 (06/22 1951) Intake/Output from previous day:   Intake/Output from this shift:    Labs: No results for input(s): WBC, HGB, PLT, LABCREA, CREATININE in the last 72 hours. CrCl cannot be calculated (Unknown ideal weight.). No results for input(s): VANCOTROUGH, VANCOPEAK, VANCORANDOM, GENTTROUGH, GENTPEAK, GENTRANDOM, TOBRATROUGH, TOBRAPEAK, TOBRARND, AMIKACINPEAK, AMIKACINTROU, AMIKACIN in the last 72 hours.   Microbiology: Recent Results (from the past 720 hour(s))  Urine culture     Status: None   Collection Time: 11/29/14  1:28 PM  Result Value Ref Range Status   Specimen Description URINE, CLEAN CATCH  Final   Special Requests NONE  Final   Colony Count   Final    >=100,000 COLONIES/ML Performed at [facility]    Culture   Final    KLEBSIELLA PNEUMONIAE Performed at [facility]    Report Status 12/01/2014 FINAL  Final   Organism ID, Bacteria KLEBSIELLA PNEUMONIAE  Final      Susceptibility   Klebsiella pneumoniae - MIC*    AMPICILLIN RESISTANT      CEFAZOLIN <=4 SENSITIVE Sensitive     CEFTRIAXONE <=1 SENSITIVE Sensitive     CIPROFLOXACIN <=0.25 SENSITIVE Sensitive     GENTAMICIN <=1 SENSITIVE Sensitive     LEVOFLOXACIN <=0.12 SENSITIVE Sensitive     NITROFURANTOIN 32 SENSITIVE Sensitive     TOBRAMYCIN <=1 SENSITIVE Sensitive     TRIMETH/SULFA <=20 SENSITIVE Sensitive     PIP/TAZO <=4 SENSITIVE Sensitive     * KLEBSIELLA PNEUMONIAE  Clostridium Difficile by PCR     Status: None   Collection Time: 12/01/14  6:05 AM  Result Value Ref Range Status   C difficile by pcr NEGATIVE NEGATIVE Final     Medical History: Past Medical History  Diagnosis Date  . Atrial fibrillation   . Hypertension   . Arthritis   . GERD (gastroesophageal reflux disease)   . COPD (chronic obstructive pulmonary disease)   . PNA (pneumonia)   . Anemia due to GI blood loss 07/11/2011  . 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 Stroke 2009    aphasia and memory impairment as residual  . Hypoxia   . Atrial fibrillation, permanent, rate controlled on coumadin 05/10/2011  . Hypogammaglobulinemia 06/01/2012    Immunoglobulins 05/01/2012-normal IgG M., normal IgA,  low IgG 344([phone]).   . 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 CVA history 05/10/2011  . CHF (congestive heart failure) 66/29/4765    diastolic  . History of nuclear stress test 2009    normal dobutamine myoview   Assessment: 26 yoF presents from SNF with N/V and SOB.  Hx Afib previously on coumadin which was recently discontinued 2/2 supratherapeutic INR and anemia, CVA with residual aphasia and memory impairment, anemia with known GI AVM, and CHF (EF = 55-60% on 10/'15).  Pharmacy consulted to start aztreonam and vancomycin for sepsis.  First doses ordered in ED.     Allergies: PCN (hives)  WBC elevated @ 33.7. SCr 2.1, CrCl ~16 (N21). Lactic acid high 7.07.   Tm24h: 100.2 Most recent weight = 57.2kg  6/22 >> Aztreonam  >>  6/22 >> Vancomycin  >>    6/22 blood x2: collected / urine:  / sputum:   Dose changes/levels:    Goal of Therapy:  Vancomycin trough 15-20 mcg/ml Eradication of infection  Plan:  Vancomycin 1g ordered in ED.  Maintenance doses Vancomycin 750mg  IV q48h.   Aztreonam 1g now, then 500mg  IV q8h.  F/u renal fxn closely to adjust doses.   F/u cultures, clinical course, VT at [facility].    Ralene Bathe, PharmD, BCPS 12/22/2014, 9:18 PM  Pager: [phone]

## 2014-12-24 NOTE — ED Notes (Signed)
Patient transported to CT 

## 2014-12-24 NOTE — ED Notes (Signed)
Per EMS, pt from [facility], pt reports n/v x 2 hours today.  Pt is alert but non-verbal.  Pt was hypoxic upon arrival on scene, placed on NRB, hx of COPD and CHF and afib.

## 2014-12-24 NOTE — ED Notes (Addendum)
Notified RN, Brandy, pt. i-stat CG4 Lactic Acid 8.16.

## 2014-12-24 NOTE — ED Provider Notes (Addendum)
Patient presents from a nursing facility. Accompanied by her daughter. Seen by the daughter yesterday and was "normal". Daughter went at lunch today. States that her mother seemed weak and was not speaking. Ultimately she has for her to be transported here. Not febrile. No fall or injury. No cough or short of breath. Recent admission within the last month for Klebsiella urinary tract infection. Finished antibiotics over 1 week ago.  On arrival she is tachycardic hypoxemic altered and nonverbal. Soft benign abdomen. Sinus tach on the monitor. Diminished left basilar breath sounds.  Patient placed on mask O2 and an C Pap for one half hour. ABG shows respiratory alkalosis. Lactic acid 7.07. Leukocytosis at 33,000. Hemoglobin 10.9.  Given vancomycin and history and exam. Pressures worsened. Had second IV placed.  She given 30 mL/kg which estimates 1800 mL of fluid. Pressure still 80s. Given an initial 500. Critical care consult. Multiple discussions with daughter. Patient does not want to be intubated or resuscitated. Is not opposed to ICU care, antibiotics, Bipap, or pressors.  Angiocath insertion Performed by: Lolita Patella  Consent: Verbal consent obtained. Risks and benefits: risks, benefits and alternatives were discussed Time out: Immediately prior to procedure a "time out" was called to verify the correct patient, procedure, equipment, support staff and site/side marked as required.  Preparation: Patient was prepped and draped in the usual sterile fashion.  Vein Location: Right AC  Ultrasound Guided  Gauge: 20  Normal blood return and flush without difficulty Patient tolerance: Patient tolerated the procedure well with no immediate complications.  CRITICAL CARE Performed by: Tanna Furry JOSEPH   Total critical care time: 45 min  Critical care time was exclusive of separately billable procedures and treating other patients.  Critical care was necessary to treat or prevent  imminent or life-threatening deterioration.  Critical care was time spent personally by me on the following activities: development of treatment plan with patient and/or surrogate as well as nursing, discussions with consultants, evaluation of patient's response to treatment, examination of patient, obtaining history from patient or surrogate, ordering and performing treatments and interventions, ordering and review of laboratory studies, ordering and review of radiographic studies, pulse oximetry and re-evaluation of patient's condition.        Tanna Furry, MD 12/11/2014 [place], MD 12/05/2014 2241

## 2014-12-24 NOTE — ED Notes (Signed)
Bed: WA09 Expected date:  Expected time:  Means of arrival:  Comments: EMS/17F/n/v

## 2014-12-24 NOTE — ED Notes (Signed)
Bed: RESB Expected date:  Expected time:  Means of arrival:  Comments: Room 9 

## 2014-12-24 NOTE — ED Notes (Signed)
MD at bedside. 

## 2014-12-24 NOTE — ED Notes (Signed)
Notified RN,Harris, pt. i-stat CG4 results 7.07.

## 2014-12-25 ENCOUNTER — Inpatient Hospital Stay (HOSPITAL_COMMUNITY): Payer: Medicare Other

## 2014-12-25 DIAGNOSIS — J9601 Acute respiratory failure with hypoxia: Secondary | ICD-10-CM | POA: Insufficient documentation

## 2014-12-25 DIAGNOSIS — N179 Acute kidney failure, unspecified: Secondary | ICD-10-CM | POA: Insufficient documentation

## 2014-12-25 DIAGNOSIS — N189 Chronic kidney disease, unspecified: Secondary | ICD-10-CM | POA: Insufficient documentation

## 2014-12-25 MED ORDER — MORPHINE SULFATE 2 MG/ML IJ SOLN: 1.0000 mg | INTRAMUSCULAR | Status: DC | PRN

## 2014-12-25 MED ORDER — SODIUM CHLORIDE 0.9 % IV SOLN
INTRAVENOUS | Status: DC
  Administered 2014-12-25: 02:00:00 via INTRAVENOUS

## 2014-12-25 MED ORDER — ACETAMINOPHEN 325 MG PO TABS: 650.0000 mg | ORAL_TABLET | ORAL | Status: DC | PRN

## 2014-12-25 MED ORDER — SODIUM CHLORIDE 0.9 % IV SOLN: 250.0000 mL | INTRAVENOUS | Status: DC | PRN

## 2014-12-25 MED ORDER — ENOXAPARIN SODIUM 30 MG/0.3ML ~~LOC~~ SOLN: 30.0000 mg | SUBCUTANEOUS | Status: DC

## 2014-12-25 MED ORDER — ONDANSETRON HCL 4 MG/2ML IJ SOLN: 4.0000 mg | Freq: Four times a day (QID) | INTRAMUSCULAR | Status: DC | PRN

## 2014-12-25 MED ORDER — BUDESONIDE 0.25 MG/2ML IN SUSP: 0.5000 mg | Freq: Two times a day (BID) | RESPIRATORY_TRACT | Status: DC

## 2014-12-25 MED ORDER — CEFTRIAXONE SODIUM IN DEXTROSE 20 MG/ML IV SOLN
1.0000 g | INTRAVENOUS | Status: DC
  Filled 2014-12-25: qty 50

## 2014-12-25 MED ORDER — HEPARIN SODIUM (PORCINE) 5000 UNIT/ML IJ SOLN
5000.0000 [IU] | Freq: Three times a day (TID) | INTRAMUSCULAR | Status: DC
  Administered 2014-12-25: 5000 [IU] via SUBCUTANEOUS
  Filled 2014-12-25 (×4): qty 1

## 2014-12-26 LAB — URINE CULTURE: CULTURE: NO GROWTH

## 2014-12-28 LAB — CULTURE, BLOOD (ROUTINE X 2)

## 2014-12-30 LAB — CULTURE, BLOOD (ROUTINE X 2): CULTURE: NO GROWTH

## 2015-01-02 NOTE — Progress Notes (Signed)
Times two attempts lab techs were unable to draw morning labs .

## 2015-01-02 NOTE — Progress Notes (Signed)
Spiritual care providing support around loss / grief.   Pt goes by [employer].  Husband has dementia and family wishes to bring him to bedside to see pt.  Remer Macho will be in [place] and family is trying to decide whether to attempt to transport pt husband to [place].   Prayers offered at bedside at family request, grief support / education.     01-04-2015 1100  Clinical Encounter Type  Visited With Family  Visit Type Death;Spiritual support;Psychological support  Referral From Nurse  Consult/Referral To Nurse  Recommendations continued support around grief / pt's husband with dementia  Spiritual Encounters  Spiritual Needs Grief support;Emotional;Prayer  Stress Factors  Family Stress Factors Loss

## 2015-01-02 NOTE — Discharge Summary (Signed)
DEATH SUMMARY  DATE OF ADMISSION:  2015/01/05  DATE OF DISCHARGE/DEATH:  01/06/15  ADMISSION DIAGNOSES:   Septic shock Severe sepsis HCAP Acute on chronic hypoxic respiratory failure COPD AKI, oliguric CKD CAF H/O Htn H/O CHF Chronic prednisone therapy - likely secondary adrenal insufficiency Chronic anemia Hyperglycemia Dementia Acute encephalopathy  DISCHARGE DIAGNOSES:   Septic shock Severe sepsis HCAP Acute on chronic hypoxic respiratory failure COPD AKI, oliguric CKD CAF H/O Htn H/O CHF Chronic prednisone therapy - likely secondary adrenal insufficiency Chronic anemia Hyperglycemia Dementia Acute encephalopathy Bacteremia  PRESENTATION:   Pt was admitted to the PCCM service with the following HPI and the above admission diagnoses:  HISTORY OF PRESENT ILLNESS: This is an 81 year old female who had been admitted to [facility] long hospital in June 2016 for a urinary tract infection (Klebsiella) and acute on chronic kidney failure who came back to the emergency department on 05-Jan-2015 with shortness of breath. Per chart review, after last admission, she was discharged to a skilled nursing facility for further rehabilitation efforts. Prior to that admit, she had been living in ALF. On 01/05/23, daughter went to visit pt and noticed that she was weak and not communicating normally. Daughter had seen her 1 day prior and pt was normal at that time. Daughter therefore requested transfer to ED for further evaluation.   In ED, she was tachycardic, hypoxic, and had AMS. CXR revealed new left sided infiltrates c/w HCAP . Labs significant for lactate of 7 and WBC 33K with left shift. She remained hypotensive after 2L IVF and PCCM was called for admission with possible central line insertion and vaso pressor support.  After extensive discussion with pt's daughter, pt has had poor quality of life recently given that she has been in SNF and has seemed very depressed. Daughter called  her brother (pt's son) and discussed current situation with him. They both felt that pt would not want aggressive measures under these circumstances and have therefore requested that we do not attempt resuscitation but rather focus on pt comfort.  HOSPITAL COURSE:   She was admitted to gen med floor for abx and morphine PRN for respiratory discomfort On the morning following admission she was noted to be comfortable and minimally responsive Her blood culture was positive for gram variable rods Antibiotics were adjusted Family was updated in detail Chaplain services were consulted for last rites The pt passed away peacefulyl shortly thereafter   Cause of death:  Septic shock due to HCAP  Contributing factors: COPD, CAF, dementia, adrenal insufficiency   Autopsy:No  Smoking: yes   Merton Border, MD;  PCCM service; [place] [phone]

## 2015-01-02 NOTE — Progress Notes (Signed)
CRITICAL VALUE ALERT  Critical value received:   positive gram rod variable  Date of notification:  12/24/08  Time of notification:  0855  Critical value read back:yes  Nurse who received alert: Lottie Dawson  MD notified (1st page): DR Wendee Beavers  Time of first page:  0910  MD notified (2nd page):  Time of second page:  Responding MD:  DR Wendee Beavers  Time MD responded:  [phone]

## 2015-01-02 NOTE — Progress Notes (Signed)
Found pt unresponsive , no pulse ,breathing appreciated,no v/s, pronounced dead at 10:35 am,  Confirmed by second RN ,  family at bedside, DR VEGA at bedside.

## 2015-01-02 NOTE — ED Notes (Signed)
Patient has had two medium soft stools during her time in the ED, both smelling of blood.

## 2015-01-02 DEATH — deceased

## 2015-01-13 ENCOUNTER — Ambulatory Visit: Payer: Medicare Other | Admitting: Family

## 2015-01-13 ENCOUNTER — Ambulatory Visit: Payer: Medicare Other | Admitting: Hematology and Oncology

## 2015-01-13 ENCOUNTER — Other Ambulatory Visit: Payer: Medicare Other

## 2015-02-11 ENCOUNTER — Ambulatory Visit: Payer: Medicare Other | Admitting: Internal Medicine

## 2015-05-11 ENCOUNTER — Other Ambulatory Visit: Payer: Self-pay | Admitting: Hematology and Oncology
